# Patient Record
Sex: Male | Born: 1940 | Race: White | Hispanic: No | Marital: Married | Smoking: Former smoker
Health system: Southern US, Community
[De-identification: ages and names within clinical notes are randomized; demographics above are authoritative.]

## PROBLEM LIST (undated history)

## (undated) DIAGNOSIS — K7031 Alcoholic cirrhosis of liver with ascites: Secondary | ICD-10-CM

## (undated) DIAGNOSIS — Z8673 Personal history of transient ischemic attack (TIA), and cerebral infarction without residual deficits: Secondary | ICD-10-CM

## (undated) DIAGNOSIS — E7800 Pure hypercholesterolemia, unspecified: Secondary | ICD-10-CM

## (undated) DIAGNOSIS — I509 Heart failure, unspecified: Secondary | ICD-10-CM

## (undated) DIAGNOSIS — I5189 Other ill-defined heart diseases: Secondary | ICD-10-CM

## (undated) DIAGNOSIS — I10 Essential (primary) hypertension: Secondary | ICD-10-CM

## (undated) DIAGNOSIS — M109 Gout, unspecified: Secondary | ICD-10-CM

## (undated) DIAGNOSIS — C801 Malignant (primary) neoplasm, unspecified: Secondary | ICD-10-CM

## (undated) DIAGNOSIS — Z8679 Personal history of other diseases of the circulatory system: Secondary | ICD-10-CM

## (undated) DIAGNOSIS — F1010 Alcohol abuse, uncomplicated: Secondary | ICD-10-CM

## (undated) DIAGNOSIS — M1990 Unspecified osteoarthritis, unspecified site: Secondary | ICD-10-CM

## (undated) DIAGNOSIS — I2510 Atherosclerotic heart disease of native coronary artery without angina pectoris: Secondary | ICD-10-CM

## (undated) DIAGNOSIS — I639 Cerebral infarction, unspecified: Secondary | ICD-10-CM

## (undated) HISTORY — PX: SPINE SURGERY: SHX786

## (undated) HISTORY — DX: Other ill-defined heart diseases: I51.89

## (undated) HISTORY — DX: Essential (primary) hypertension: I10

## (undated) HISTORY — DX: Gout, unspecified: M10.9

## (undated) HISTORY — DX: Alcohol abuse, uncomplicated: F10.10

## (undated) HISTORY — DX: Atherosclerotic heart disease of native coronary artery without angina pectoris: I25.10

## (undated) HISTORY — DX: Personal history of other diseases of the circulatory system: Z86.79

## (undated) HISTORY — DX: Unspecified osteoarthritis, unspecified site: M19.90

## (undated) HISTORY — PX: SKIN SURGERY: SHX2413

## (undated) HISTORY — DX: Personal history of transient ischemic attack (TIA), and cerebral infarction without residual deficits: Z86.73

## (undated) HISTORY — DX: Malignant (primary) neoplasm, unspecified: C80.1

## (undated) HISTORY — DX: Alcoholic cirrhosis of liver with ascites: K70.31

---

## 1992-11-13 DIAGNOSIS — C801 Malignant (primary) neoplasm, unspecified: Secondary | ICD-10-CM

## 1992-11-13 HISTORY — PX: PROSTATE SURGERY: SHX751

## 1992-11-13 HISTORY — DX: Malignant (primary) neoplasm, unspecified: C80.1

## 2009-03-13 HISTORY — PX: EYE SURGERY: SHX253

## 2009-11-13 DIAGNOSIS — Z8673 Personal history of transient ischemic attack (TIA), and cerebral infarction without residual deficits: Secondary | ICD-10-CM

## 2009-11-13 HISTORY — DX: Personal history of transient ischemic attack (TIA), and cerebral infarction without residual deficits: Z86.73

## 2009-12-30 ENCOUNTER — Ambulatory Visit: Payer: Self-pay | Admitting: Cardiology

## 2009-12-30 ENCOUNTER — Inpatient Hospital Stay (HOSPITAL_COMMUNITY)
Admission: EM | Admit: 2009-12-30 | Discharge: 2010-01-01 | Payer: Self-pay | Source: Home / Self Care | Admitting: Emergency Medicine

## 2009-12-31 ENCOUNTER — Encounter (INDEPENDENT_AMBULATORY_CARE_PROVIDER_SITE_OTHER): Payer: Self-pay | Admitting: Internal Medicine

## 2009-12-31 ENCOUNTER — Ambulatory Visit: Payer: Self-pay | Admitting: Vascular Surgery

## 2011-02-02 LAB — POTASSIUM: Potassium: 3.4 mEq/L — ABNORMAL LOW (ref 3.5–5.1)

## 2011-02-02 LAB — COMPREHENSIVE METABOLIC PANEL
AST: 35 U/L (ref 0–37)
Alkaline Phosphatase: 62 U/L (ref 39–117)
BUN: 8 mg/dL (ref 6–23)
CO2: 24 mEq/L (ref 19–32)
Chloride: 101 mEq/L (ref 96–112)
GFR calc Af Amer: >60 mL/min (ref >60)
Glucose, Bld: 94 mg/dL (ref 70–99)
Potassium: 4 mEq/L (ref 3.5–5.1)
Sodium: 144 mEq/L (ref 135–145)

## 2011-02-02 LAB — URINALYSIS, ROUTINE W REFLEX MICROSCOPIC
Glucose, UA: NEGATIVE mg/dL
Hgb urine dipstick: NEGATIVE
Nitrite: NEGATIVE
Specific Gravity, Urine: 1.012 (ref 1.005–1.030)
Urobilinogen, UA: 1 mg/dL (ref 0.0–1.0)
pH: 6.5 (ref 5.0–8.0)

## 2011-02-02 LAB — DIFFERENTIAL
Eosinophils Absolute: 0 10*3/uL (ref 0.0–0.7)
Eosinophils Relative: 1 % (ref 0–5)
Lymphs Abs: 1.6 10*3/uL (ref 0.7–4.0)
Monocytes Relative: 8 % (ref 3–12)
Neutro Abs: 7.2 10*3/uL (ref 1.7–7.7)

## 2011-02-02 LAB — LIPID PANEL
HDL: 41 mg/dL (ref >39)
VLDL: 24 mg/dL (ref 0–40)

## 2011-02-02 LAB — ETHANOL: Alcohol, Ethyl (B): 21 mg/dL — ABNORMAL HIGH (ref 0–10)

## 2011-02-02 LAB — ANA: Anti Nuclear Antibody(ANA): NEGATIVE

## 2011-02-02 LAB — RAPID URINE DRUG SCREEN, HOSP PERFORMED
Barbiturates: NOT DETECTED
Benzodiazepines: NOT DETECTED

## 2011-02-02 LAB — VITAMIN B12: Vitamin B-12: 229 pg/mL (ref 211–911)

## 2011-02-02 LAB — CBC
HCT: 46.7 % (ref 39.0–52.0)
RDW: 12.6 % (ref 11.5–15.5)

## 2011-02-02 LAB — MAGNESIUM: Magnesium: 2 mg/dL (ref 1.5–2.5)

## 2011-02-02 LAB — PROTIME-INR
INR: 1.1 (ref 0.00–1.49)
Prothrombin Time: 14.1 seconds (ref 11.6–15.2)

## 2011-03-14 DIAGNOSIS — Z8679 Personal history of other diseases of the circulatory system: Secondary | ICD-10-CM

## 2011-03-14 HISTORY — DX: Personal history of other diseases of the circulatory system: Z86.79

## 2011-03-14 HISTORY — PX: BURR HOLE FOR SUBDURAL HEMATOMA: SHX1275

## 2011-03-21 ENCOUNTER — Inpatient Hospital Stay (HOSPITAL_COMMUNITY)
Admission: EM | Admit: 2011-03-21 | Discharge: 2011-03-27 | DRG: 026 | Disposition: A | Discharge status: Home Health | Payer: Medicare Other | Attending: Neurosurgery | Admitting: Neurosurgery

## 2011-03-21 ENCOUNTER — Inpatient Hospital Stay (INDEPENDENT_AMBULATORY_CARE_PROVIDER_SITE_OTHER)
Admission: RE | Admit: 2011-03-21 | Discharge: 2011-03-21 | Disposition: A | Discharge status: Home / Self Care | Payer: Medicare Other | Source: Ambulatory Visit | Attending: Family Medicine | Admitting: Family Medicine

## 2011-03-21 ENCOUNTER — Emergency Department (HOSPITAL_COMMUNITY): Payer: Medicare Other

## 2011-03-21 DIAGNOSIS — I10 Essential (primary) hypertension: Secondary | ICD-10-CM | POA: Diagnosis present

## 2011-03-21 DIAGNOSIS — F17200 Nicotine dependence, unspecified, uncomplicated: Secondary | ICD-10-CM | POA: Diagnosis present

## 2011-03-21 DIAGNOSIS — X58XXXA Exposure to other specified factors, initial encounter: Secondary | ICD-10-CM

## 2011-03-21 DIAGNOSIS — Z8673 Personal history of transient ischemic attack (TIA), and cerebral infarction without residual deficits: Secondary | ICD-10-CM

## 2011-03-21 DIAGNOSIS — Z8546 Personal history of malignant neoplasm of prostate: Secondary | ICD-10-CM

## 2011-03-21 DIAGNOSIS — S065X0A Traumatic subdural hemorrhage without loss of consciousness, initial encounter: Principal | ICD-10-CM | POA: Diagnosis present

## 2011-03-21 DIAGNOSIS — Y998 Other external cause status: Secondary | ICD-10-CM

## 2011-03-21 DIAGNOSIS — R51 Headache: Secondary | ICD-10-CM

## 2011-03-21 DIAGNOSIS — G8190 Hemiplegia, unspecified affecting unspecified side: Secondary | ICD-10-CM | POA: Diagnosis present

## 2011-03-21 DIAGNOSIS — Z7982 Long term (current) use of aspirin: Secondary | ICD-10-CM

## 2011-03-21 LAB — DIFFERENTIAL
Basophils Absolute: 0.1 10*3/uL (ref 0.0–0.1)
Basophils Relative: 1 % (ref 0–1)
Lymphs Abs: 1.5 10*3/uL (ref 0.7–4.0)
Monocytes Relative: 7 % (ref 3–12)
Neutro Abs: 6.6 10*3/uL (ref 1.7–7.7)
Neutrophils Relative %: 75 % (ref 43–77)

## 2011-03-21 LAB — CBC
HCT: 49 % (ref 39.0–52.0)
MCH: 34.8 pg — ABNORMAL HIGH (ref 26.0–34.0)
MCV: 94.8 fL (ref 78.0–100.0)
RBC: 5.17 MIL/uL (ref 4.22–5.81)
WBC: 8.8 10*3/uL (ref 4.0–10.5)

## 2011-03-21 LAB — BASIC METABOLIC PANEL
Creatinine, Ser: 0.75 mg/dL (ref 0.4–1.5)
GFR calc non Af Amer: >60 mL/min (ref >60)
Glucose, Bld: 92 mg/dL (ref 70–99)
Sodium: 140 mEq/L (ref 135–145)

## 2011-03-21 LAB — MRSA PCR SCREENING: MRSA by PCR: NEGATIVE

## 2011-03-21 LAB — PROTIME-INR: INR: 0.94 (ref 0.00–1.49)

## 2011-03-21 LAB — HEPATIC FUNCTION PANEL
AST: 99 U/L — ABNORMAL HIGH (ref 0–37)
Albumin: 4.1 g/dL (ref 3.5–5.2)
Total Bilirubin: 1.2 mg/dL (ref 0.3–1.2)

## 2011-03-21 LAB — TYPE AND SCREEN: Antibody Screen: NEGATIVE

## 2011-03-21 LAB — APTT: aPTT: 24 seconds (ref 24–37)

## 2011-03-22 ENCOUNTER — Inpatient Hospital Stay (HOSPITAL_COMMUNITY): Payer: Medicare Other

## 2011-03-22 LAB — BASIC METABOLIC PANEL
Calcium: 8.5 mg/dL (ref 8.4–10.5)
Chloride: 99 mEq/L (ref 96–112)
Creatinine, Ser: 0.75 mg/dL (ref 0.4–1.5)
GFR calc Af Amer: >60 mL/min (ref >60)
GFR calc non Af Amer: >60 mL/min (ref >60)

## 2011-03-22 LAB — PHOSPHORUS: Phosphorus: 2.7 mg/dL (ref 2.3–4.6)

## 2011-03-22 LAB — MAGNESIUM: Magnesium: 1.7 mg/dL (ref 1.5–2.5)

## 2011-03-23 ENCOUNTER — Inpatient Hospital Stay (HOSPITAL_COMMUNITY): Payer: Medicare Other

## 2011-03-23 LAB — BASIC METABOLIC PANEL
BUN: 8 mg/dL (ref 6–23)
Calcium: 8.2 mg/dL — ABNORMAL LOW (ref 8.4–10.5)
Creatinine, Ser: 0.66 mg/dL (ref 0.4–1.5)
GFR calc non Af Amer: >60 mL/min (ref >60)

## 2011-03-28 NOTE — Op Note (Signed)
  NAMESHAMERE, CAMPAS NO.:  [id number]  MEDICAL RECORD NO.:  [id number]           PATIENT TYPE:  I  LOCATION:  3114                         FACILITY:  [facility]  PHYSICIAN:  Danae Orleans. Venetia Maxon, M.D.  DATE OF BIRTH:  August 25, 1941  DATE OF PROCEDURE:  03/21/2011 DATE OF DISCHARGE:                              OPERATIVE REPORT   PREOPERATIVE DIAGNOSIS:  Right-sided subdural hematoma.  POSTOPERATIVE DIAGNOSIS:  Right-sided subdural hematoma.  PROCEDURE:  Right craniotomy for subdural hematoma.  SURGEON:  Danae Orleans. Venetia Maxon, MD  ANESTHESIA:  General endotracheal anesthesia.  ESTIMATED BLOOD LOSS:  Minimal.  COMPLICATIONS:  None.  DISPOSITION:  Recovery.  INDICATIONS:  Samuel Flores is a 70 year old male with a large pan- hemispheric right-sided subdural hematoma.  It was elected to take him to Surgery for craniotomy and evacuation of subdural.  DESCRIPTION OF PROCEDURE:  [name] was brought to the operating room.  Following satisfactory and uncomplicated induction of general endotracheal anesthesia plus intravenous lines, arterial line, and Foley catheter, he was placed in a supine position on the operating table, was intubated without difficulty by Anesthesia.  His right frontotemporal parietal scalp was shaved, then prepped and draped in usual sterile fashion.  He was placed left side of his head on donut head holder. Area of planned incision was infiltrated with local lidocaine.  Incision was made, carried through the pericranium.  A single bur hole was made and trephined, and then a small bone flap was elevated.  The dura appeared to be bulging and bluish discoloration.  The dura was incised with significant removal of bloody CSF under pressure.  Dura was then opened, tacked back with dural tack-up stitches, and the subdural space was extensively irrigated.  There did appear to be membrane between the subdural space and the subarachnoid space, and this  was carefully opened to allow communication of this loculated portion of subdural.  A #10 JP drain was inserted through a separate stab incision, anchored with a nylon stitch.  The bone flap was replaced with plates and the galea was reapproximated with 2-0 Vicryl sutures.  The skin edges were reapproximated with staples.  Sterile occlusive dressing was placed.  The patient was taken to recovery having tolerated the procedure well.  Counts were correct at the end of the case.     Danae Orleans. Venetia Maxon, M.D.     JDS/MEDQ  D:  03/21/2011  T:  03/22/2011  Job:  [id number]  Electronically Signed by Maeola Harman M.D. on 03/28/2011 07:44:35 AM
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
## 2011-04-04 ENCOUNTER — Other Ambulatory Visit: Payer: Self-pay | Admitting: Neurosurgery

## 2011-04-04 DIAGNOSIS — S065X9A Traumatic subdural hemorrhage with loss of consciousness of unspecified duration, initial encounter: Secondary | ICD-10-CM
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
## 2011-04-12 ENCOUNTER — Ambulatory Visit
Admission: RE | Admit: 2011-04-12 | Discharge: 2011-04-12 | Disposition: A | Discharge status: Home / Self Care | Payer: Medicare Other | Source: Ambulatory Visit | Attending: Neurosurgery | Admitting: Neurosurgery

## 2011-04-12 DIAGNOSIS — S065X9A Traumatic subdural hemorrhage with loss of consciousness of unspecified duration, initial encounter: Secondary | ICD-10-CM

## 2011-12-21 DIAGNOSIS — I10 Essential (primary) hypertension: Secondary | ICD-10-CM | POA: Diagnosis not present

## 2011-12-21 DIAGNOSIS — E7800 Pure hypercholesterolemia, unspecified: Secondary | ICD-10-CM | POA: Diagnosis not present

## 2012-01-05 DIAGNOSIS — I10 Essential (primary) hypertension: Secondary | ICD-10-CM | POA: Diagnosis not present

## 2012-01-05 DIAGNOSIS — E785 Hyperlipidemia, unspecified: Secondary | ICD-10-CM | POA: Diagnosis not present

## 2012-01-09 DIAGNOSIS — Z79899 Other long term (current) drug therapy: Secondary | ICD-10-CM | POA: Diagnosis not present

## 2012-01-25 DIAGNOSIS — H04129 Dry eye syndrome of unspecified lacrimal gland: Secondary | ICD-10-CM | POA: Diagnosis not present

## 2012-01-25 DIAGNOSIS — H43399 Other vitreous opacities, unspecified eye: Secondary | ICD-10-CM | POA: Diagnosis not present

## 2012-01-25 DIAGNOSIS — H524 Presbyopia: Secondary | ICD-10-CM | POA: Diagnosis not present

## 2012-06-18 DIAGNOSIS — Z79899 Other long term (current) drug therapy: Secondary | ICD-10-CM | POA: Diagnosis not present

## 2012-06-18 DIAGNOSIS — E7800 Pure hypercholesterolemia, unspecified: Secondary | ICD-10-CM | POA: Diagnosis not present

## 2012-06-18 DIAGNOSIS — I10 Essential (primary) hypertension: Secondary | ICD-10-CM | POA: Diagnosis not present

## 2012-06-18 DIAGNOSIS — I209 Angina pectoris, unspecified: Secondary | ICD-10-CM | POA: Diagnosis not present

## 2012-10-02 DIAGNOSIS — I10 Essential (primary) hypertension: Secondary | ICD-10-CM | POA: Diagnosis not present

## 2012-10-02 DIAGNOSIS — E785 Hyperlipidemia, unspecified: Secondary | ICD-10-CM | POA: Diagnosis not present

## 2012-12-17 DIAGNOSIS — E7800 Pure hypercholesterolemia, unspecified: Secondary | ICD-10-CM | POA: Diagnosis not present

## 2012-12-17 DIAGNOSIS — I10 Essential (primary) hypertension: Secondary | ICD-10-CM | POA: Diagnosis not present

## 2012-12-17 DIAGNOSIS — Z79899 Other long term (current) drug therapy: Secondary | ICD-10-CM | POA: Diagnosis not present

## 2013-01-01 ENCOUNTER — Encounter (HOSPITAL_COMMUNITY): Payer: Self-pay | Admitting: Neurology

## 2013-01-01 ENCOUNTER — Observation Stay (HOSPITAL_COMMUNITY)
Admission: EM | Admit: 2013-01-01 | Discharge: 2013-01-02 | Disposition: A | Discharge status: Home / Self Care | Payer: Medicare Other | Attending: Cardiology | Admitting: Cardiology

## 2013-01-01 ENCOUNTER — Emergency Department (HOSPITAL_COMMUNITY): Payer: Medicare Other

## 2013-01-01 DIAGNOSIS — F17200 Nicotine dependence, unspecified, uncomplicated: Secondary | ICD-10-CM | POA: Diagnosis not present

## 2013-01-01 DIAGNOSIS — I10 Essential (primary) hypertension: Secondary | ICD-10-CM | POA: Diagnosis not present

## 2013-01-01 DIAGNOSIS — R0789 Other chest pain: Secondary | ICD-10-CM | POA: Diagnosis not present

## 2013-01-01 DIAGNOSIS — R0602 Shortness of breath: Secondary | ICD-10-CM | POA: Diagnosis not present

## 2013-01-01 DIAGNOSIS — I2510 Atherosclerotic heart disease of native coronary artery without angina pectoris: Principal | ICD-10-CM | POA: Insufficient documentation

## 2013-01-01 DIAGNOSIS — R079 Chest pain, unspecified: Secondary | ICD-10-CM | POA: Diagnosis not present

## 2013-01-01 DIAGNOSIS — E785 Hyperlipidemia, unspecified: Secondary | ICD-10-CM | POA: Insufficient documentation

## 2013-01-01 DIAGNOSIS — I209 Angina pectoris, unspecified: Secondary | ICD-10-CM | POA: Diagnosis not present

## 2013-01-01 DIAGNOSIS — I200 Unstable angina: Secondary | ICD-10-CM

## 2013-01-01 HISTORY — DX: Essential (primary) hypertension: I10

## 2013-01-01 HISTORY — DX: Pure hypercholesterolemia, unspecified: E78.00

## 2013-01-01 LAB — URINALYSIS, ROUTINE W REFLEX MICROSCOPIC
Glucose, UA: NEGATIVE mg/dL
Hgb urine dipstick: NEGATIVE
Protein, ur: NEGATIVE mg/dL
pH: 7 (ref 5.0–8.0)

## 2013-01-01 LAB — COMPREHENSIVE METABOLIC PANEL
AST: 66 U/L — ABNORMAL HIGH (ref 0–37)
Albumin: 3.5 g/dL (ref 3.5–5.2)
Alkaline Phosphatase: 95 U/L (ref 39–117)
Chloride: 101 mEq/L (ref 96–112)
Potassium: 3.8 mEq/L (ref 3.5–5.1)
Total Bilirubin: 0.8 mg/dL (ref 0.3–1.2)
Total Protein: 6.8 g/dL (ref 6.0–8.3)

## 2013-01-01 LAB — CBC
Platelets: 178 10*3/uL (ref 150–400)
RDW: 12.5 % (ref 11.5–15.5)
WBC: 9.6 10*3/uL (ref 4.0–10.5)

## 2013-01-01 LAB — POCT I-STAT TROPONIN I: Troponin i, poc: 0 ng/mL (ref 0.00–0.08)

## 2013-01-01 MED ORDER — ACETAMINOPHEN 325 MG PO TABS: 650.0000 mg | ORAL_TABLET | ORAL | Status: DC | PRN

## 2013-01-01 MED ORDER — SODIUM CHLORIDE 0.9 % IV SOLN
INTRAVENOUS | Status: DC
  Administered 2013-01-02: 07:00:00 via INTRAVENOUS

## 2013-01-01 MED ORDER — ONDANSETRON HCL 4 MG/2ML IJ SOLN: 4.0000 mg | Freq: Three times a day (TID) | INTRAMUSCULAR | Status: AC | PRN

## 2013-01-01 MED ORDER — SODIUM CHLORIDE 0.9 % IV SOLN: INTRAVENOUS | Status: DC

## 2013-01-01 MED ORDER — ATENOLOL 50 MG PO TABS
50.0000 mg | ORAL_TABLET | Freq: Every day | ORAL | Status: DC
  Filled 2013-01-01: qty 1

## 2013-01-01 MED ORDER — ENALAPRIL MALEATE 20 MG PO TABS
40.0000 mg | ORAL_TABLET | Freq: Every day | ORAL | Status: DC
  Filled 2013-01-01: qty 2

## 2013-01-01 MED ORDER — NITROGLYCERIN 0.4 MG SL SUBL
0.4000 mg | SUBLINGUAL_TABLET | SUBLINGUAL | Status: DC | PRN
Start: 2013-01-01 — End: 2013-01-02

## 2013-01-01 MED ORDER — ASPIRIN EC 81 MG PO TBEC
81.0000 mg | DELAYED_RELEASE_TABLET | Freq: Every day | ORAL | Status: DC
  Filled 2013-01-01: qty 1

## 2013-01-01 MED ORDER — ENOXAPARIN SODIUM 80 MG/0.8ML ~~LOC~~ SOLN
75.0000 mg | Freq: Once | SUBCUTANEOUS | Status: AC
Start: 2013-01-01 — End: 2013-01-01
  Administered 2013-01-01: 75 mg via SUBCUTANEOUS
  Filled 2013-01-01: qty 0.8

## 2013-01-01 MED ORDER — NITROGLYCERIN IN D5W 200-5 MCG/ML-% IV SOLN
10.0000 ug/min | INTRAVENOUS | Status: DC
  Administered 2013-01-01: 10 ug/min via INTRAVENOUS
  Filled 2013-01-01: qty 250

## 2013-01-01 MED ORDER — ENOXAPARIN SODIUM 80 MG/0.8ML ~~LOC~~ SOLN
75.0000 mg | Freq: Two times a day (BID) | SUBCUTANEOUS | Status: DC
  Administered 2013-01-02: 75 mg via SUBCUTANEOUS
  Filled 2013-01-01 (×3): qty 0.8

## 2013-01-01 MED ORDER — SODIUM CHLORIDE 0.9 % IV SOLN: 20.0000 mL | INTRAVENOUS | Status: DC

## 2013-01-01 MED ORDER — SODIUM CHLORIDE 0.9 % IJ SOLN: 3.0000 mL | Freq: Two times a day (BID) | INTRAMUSCULAR | Status: DC

## 2013-01-01 MED ORDER — SODIUM CHLORIDE 0.9 % IJ SOLN: 3.0000 mL | INTRAMUSCULAR | Status: DC | PRN

## 2013-01-01 MED ORDER — ATORVASTATIN CALCIUM 10 MG PO TABS
10.0000 mg | ORAL_TABLET | Freq: Every day | ORAL | Status: DC
  Filled 2013-01-01: qty 1

## 2013-01-01 MED ORDER — SODIUM CHLORIDE 0.9 % IV SOLN: 250.0000 mL | INTRAVENOUS | Status: DC | PRN

## 2013-01-01 MED ORDER — SODIUM CHLORIDE 0.9 % IV SOLN
INTRAVENOUS | Status: AC
  Administered 2013-01-01: 16:00:00 via INTRAVENOUS

## 2013-01-01 MED ORDER — NITROGLYCERIN IN D5W 200-5 MCG/ML-% IV SOLN: 5.0000 ug/min | INTRAVENOUS | Status: DC

## 2013-01-01 MED ORDER — AMLODIPINE BESYLATE 10 MG PO TABS
10.0000 mg | ORAL_TABLET | Freq: Every day | ORAL | Status: DC
  Filled 2013-01-01: qty 1

## 2013-01-01 MED ORDER — NITROGLYCERIN 2 % TD OINT
1.0000 [in_us] | TOPICAL_OINTMENT | Freq: Once | TRANSDERMAL | Status: AC
  Administered 2013-01-01: 1 [in_us] via TOPICAL
  Filled 2013-01-01: qty 1

## 2013-01-01 NOTE — ED Notes (Signed)
Pt c/o CP and sob while at work today around 0930 am. Pt reports once he was home and sat down he felt a lot better but then he got up to smoke his pipe then the pain came back. Pt describes pain as burning sensation in center of chest along with radiation to left arm. Pt reports this has been going on for the past year and he has gone to see a cardiologist and they did a full work up but didn't find anything.

## 2013-01-01 NOTE — ED Notes (Signed)
Attempted to call report. Per Diplomatic Services operational officer on 2600 - RN is in an isolation room and cannot take report at this moment.

## 2013-01-01 NOTE — ED Notes (Signed)
Per ems- Pt was at work today, developed sob and cp while [place]. CP radiated up left arm. EKG showing bigemy. 325 aspirin PTA, pain free upon EMS arrival, 150/90, HR 94. Skin warm and dry. 20 gauge to left hand. A x 4.

## 2013-01-01 NOTE — ED Provider Notes (Signed)
History     CSN: [id number]  Arrival date & time 01/01/13  1537   First MD Initiated Contact with Patient 01/01/13 1546      Chief Complaint  Patient presents with  . Chest Pain    (Consider location/radiation/quality/duration/timing/severity/associated sxs/prior treatment) HPI Comments: Samuel Flores is a 72 y.o. Male who was at work today, doing heavy lifting, when he developed chest pain chest pain, it was both pressure-like and burning. It radiated to his left arm. At the worst. The pain was 7/10. The initial episode of pain lasted about 2 hours. He then had some recurrent pain. 3 different times while walking; each of these episodes were brief. With the initial pain he had shortness of breath. There's been no nausea, or vomiting. He denies recent illnesses, including fever, chills, cough, nausea, vomiting, dysuria, or change in bowel habits. He took a single nitroglycerin about 10 AM without change in his discomfort. The last episode of chest pain was over an hour ago. EMS gave him a full-strength aspirin.There are no other modifying factors.  Patient is a 72 y.o. male presenting with chest pain. The history is provided by the patient.  Chest Pain   Past Medical History  Diagnosis Date  . Hypercholesteremia   . Hypertension     No past surgical history on file.  No family history on file.  History  Substance Use Topics  . Smoking status: Light Tobacco Smoker  . Smokeless tobacco: Not on file  . Alcohol Use: Yes      Review of Systems  Cardiovascular: Positive for chest pain.  All other systems reviewed and are negative.    Allergies  Review of patient's allergies indicates no known allergies.  Home Medications   Current Outpatient Rx  Name  Route  Sig  Dispense  Refill  . amLODipine (NORVASC) 10 MG tablet   Oral   Take 10 mg by mouth daily.         Marland Kitchen aspirin EC 81 MG tablet   Oral   Take 81 mg by mouth daily.         Marland Kitchen atenolol (TENORMIN) 50  MG tablet   Oral   Take 50 mg by mouth daily.         Marland Kitchen atorvastatin (LIPITOR) 20 MG tablet   Oral   Take 10 mg by mouth daily.         . enalapril (VASOTEC) 20 MG tablet   Oral   Take 40 mg by mouth daily.         . hydrochlorothiazide (MICROZIDE) 12.5 MG capsule   Oral   Take 12.5 mg by mouth daily.           BP 111/49  Pulse 43  Temp(Src) 98.5 F (36.9 C) (Oral)  Resp 25  SpO2 94%  Physical Exam  Nursing note and vitals reviewed. Constitutional: He is oriented to person, place, and time. He appears well-developed and well-nourished. No distress.  HENT:  Head: Normocephalic and atraumatic.  Right Ear: External ear normal.  Left Ear: External ear normal.  Eyes: Conjunctivae and EOM are normal. Pupils are equal, round, and reactive to light.  Neck: Normal range of motion and phonation normal. Neck supple.  Cardiovascular: Normal rate, regular rhythm, normal heart sounds and intact distal pulses.   No murmur  Pulmonary/Chest: Effort normal and breath sounds normal. No respiratory distress. He exhibits no tenderness and no bony tenderness.  Abdominal: Soft. Normal appearance. There is no tenderness.  Musculoskeletal: Normal range of motion. He exhibits no edema and no tenderness.  Neurological: He is alert and oriented to person, place, and time. He has normal strength. No cranial nerve deficit or sensory deficit. He exhibits normal muscle tone. Coordination normal.  Skin: Skin is warm, dry and intact.  Skin changes, head and neck, consistent with sun damage.  Psychiatric: He has a normal mood and affect. His behavior is normal. Judgment and thought content normal.    ED Course  Procedures (including critical care time)  Emergency department treatment; nitroglycerin ointment   Date: 08/30/2012  Rate: 108  Rhythm: Sinus tachycardia with frequent PVCs and bigeminy  QRS Axis: normal  PR and QT Intervals: normal  ST/T Wave abnormalities: normal  PR and QRS  Conduction Disutrbances:none  Narrative Interpretation:   Old EKG Reviewed: unchanged 17:00- Discussed with Cardiology Jacinto Halim), he will admit the patient, and arrange for cardiac catheterization tomorrow. She had abnormal cardiac stress test in 2011 showing inferior scar.  17:05-Additional treatment: Change nitroglycerin to intravenous drip, and Lovenox subcutaneous.  Holding orders written for, admission  CRITICAL CARE Performed by: Mancel Bale L   Total critical care time: 40 minutes  Critical care time was exclusive of separately billable procedures and treating other patients.  Critical care was necessary to treat or prevent imminent or life-threatening deterioration.  Critical care was time spent personally by me on the following activities: development of treatment plan with patient and/or surrogate as well as nursing, discussions with consultants, evaluation of patient's response to treatment, examination of patient, obtaining history from patient or surrogate, ordering and performing treatments and interventions, ordering and review of laboratory studies, ordering and review of radiographic studies, pulse oximetry and re-evaluation of patient's condition.  Labs Reviewed  CBC - Abnormal; Notable for the following:    MCH 34.6 (*)    MCHC 37.0 (*)    All other components within normal limits  COMPREHENSIVE METABOLIC PANEL - Abnormal; Notable for the following:    Glucose, Bld 166 (*)    AST 66 (*)    GFR calc non Af Amer 85 (*)    All other components within normal limits  URINALYSIS, ROUTINE W REFLEX MICROSCOPIC  POCT I-STAT TROPONIN I   Dg Chest Portable 1 View  01/01/2013  *RADIOLOGY REPORT*  Clinical Data: Chest and arm pain, shortness of breath  PORTABLE CHEST - 1 VIEW  Comparison: None.  Findings: No active infiltrate or effusion is seen.  The heart is mildly enlarged.  Mediastinal contours appear normal.  A lower anterior cervical spine fusion plate is present.  No  skeletal abnormality is seen.  IMPRESSION: No active lung disease.   Original Report Authenticated By: Dwyane Dee, M.D.      1. Angina pectoris, unstable       MDM  Clinical evaluation consistent with ACS. No pain on arrival in the ED. initial cardiac markers. Negative for injury. Patient has a very convincing story and will need to be admitted for catheterization.        Samuel Melter, MD 01/01/13 [phone]

## 2013-01-01 NOTE — H&P (Addendum)
Samuel Flores is an 72 y.o. male.   Chief Complaint: Chest pain 1 week. HPI: Patient is a 72 year old Caucasian male who I had evaluated a year and a half ago for chest discomfort.  I had also treated him aggressively for hyperlipidemia and his recent labs on 06/20/2012 where excellent.  He had been doing well and has had no recurrence of chest pain since then.  He was doing well until about a week ago when he started noticing occasional chest heaviness in the middle of the chest and this lasted for 10-15 minutes.  And he had recurrent episodes throughout the week but did not think much about this.  This was described as mild.  However this morning he went to work and before going to work he did have one more episode of chest discomfort which was much more severe but again subsided spontaneously.  While at work he started getting chest discomfort again and this time it was associated with marked dyspnea, radiation to his left arm and tingling and numbness in his left fingers.  He works in [employer] course and the routine chores that he does he was unable to do so without having to stop.  He had not used sublingual nitroglycerin in a while, but he did use and see whether it would help him.  This did not relieve his pain, however after sitting for about 15-20 minutes the pain subsided spontaneously.  He returned home and started to have chest discomfort again and this was getting worse.  He called his wife and eventually EMS was activated and was transferred to the emergency room.  He was also given aspirin after EMS was contacted and by the time EMS arrived in the house he was chest pain-free and he has remained chest pain-free since then. He smokes a pipe every day.  He does not have history of PND or orthopnea denies any palpitations denies any syncope denies any leg edema or pain.  Swelling of the lower extremities, hemoptysis.  Presently he states that he is feeling comfortable since he started on medications  in the emergency room.  Past Medical History  Diagnosis Date  . Hypercholesteremia   . Hypertension     History reviewed. No pertinent past surgical history.  No family history on file. Social History:  reports that he has been smoking.  He does not have any smokeless tobacco history on file. He reports that  drinks alcohol. His drug history is not on file.  Allergies: No Known Allergies  Medications Prior to Admission  Medication Sig Dispense Refill  . amLODipine (NORVASC) 10 MG tablet Take 10 mg by mouth daily.      Marland Kitchen aspirin EC 81 MG tablet Take 81 mg by mouth daily.      Marland Kitchen atenolol (TENORMIN) 50 MG tablet Take 50 mg by mouth daily.      Marland Kitchen atorvastatin (LIPITOR) 20 MG tablet Take 10 mg by mouth daily.      . enalapril (VASOTEC) 20 MG tablet Take 40 mg by mouth daily.      . hydrochlorothiazide (MICROZIDE) 12.5 MG capsule Take 12.5 mg by mouth daily.       Review of Systems - patient denies any bowel or bladder disturbances,  denies any recent weight gain or weight loss, denies recent long travel, no neurological deficits, denies symptoms of claudication.  He is not a diabetic.   Other systems are negative.  Blood pressure 122/56, pulse 44, temperature 98 F (36.7  C), temperature source Oral, resp. rate 19, SpO2 93.00%. General appearance: alert, cooperative, appears stated age and no distress Eyes: conjunctivae/corneas clear. PERRL, EOM's intact. Fundi benign. Neck: no adenopathy, no carotid bruit, no JVD, supple, symmetrical, trachea midline and thyroid not enlarged, symmetric, no tenderness/mass/nodules Neck: JVP - normal, carotids 2+= without bruits Resp: clear to auscultation bilaterally Chest wall: no tenderness Cardio: regular rate and rhythm, S1, S2 normal, no murmur, click, rub or gallop GI: soft, non-tender; bowel sounds normal; no masses,  no organomegaly Extremities: extremities normal, atraumatic, no cyanosis or edema Pulses: 2+ and symmetric Skin: Skin color,  texture, turgor normal. No rashes or lesions Neurologic: Grossly normal  Results for orders placed during the hospital encounter of 01/01/13 (from the past 48 hour(s))  CBC     Status: Abnormal   Collection Time    01/01/13  3:49 PM      Result Value Range   WBC 9.6  4.0 - 10.5 K/uL   RBC 4.65  4.22 - 5.81 MIL/uL   Hemoglobin 16.1  13.0 - 17.0 g/dL   HCT 16.1  09.6 - 04.5 %   MCV 93.5  78.0 - 100.0 fL   MCH 34.6 (*) 26.0 - 34.0 pg   MCHC 37.0 (*) 30.0 - 36.0 g/dL   Comment: RULED OUT INTERFERING SUBSTANCES   RDW 12.5  11.5 - 15.5 %   Platelets 178  150 - 400 K/uL  COMPREHENSIVE METABOLIC PANEL     Status: Abnormal   Collection Time    01/01/13  3:49 PM      Result Value Range   Sodium 140  135 - 145 mEq/L   Potassium 3.8  3.5 - 5.1 mEq/L   Chloride 101  96 - 112 mEq/L   CO2 25  19 - 32 mEq/L   Glucose, Bld 166 (*) 70 - 99 mg/dL   BUN 15  6 - 23 mg/dL   Creatinine, Ser 4.09  0.50 - 1.35 mg/dL   Calcium 8.9  8.4 - 81.1 mg/dL   Total Protein 6.8  6.0 - 8.3 g/dL   Albumin 3.5  3.5 - 5.2 g/dL   AST 66 (*) 0 - 37 U/L   ALT 42  0 - 53 U/L   Alkaline Phosphatase 95  39 - 117 U/L   Total Bilirubin 0.8  0.3 - 1.2 mg/dL   GFR calc non Af Amer 85 (*) >90 mL/min   GFR calc Af Amer >90  >90 mL/min   Comment:            The eGFR has been calculated     using the CKD EPI equation.     This calculation has not been     validated in all clinical     situations.     eGFR's persistently     <90 mL/min signify     possible Chronic Kidney Disease.  POCT I-STAT TROPONIN I     Status: None   Collection Time    01/01/13  4:12 PM      Result Value Range   Troponin i, poc 0.00  0.00 - 0.08 ng/mL   Comment 3            Comment: Due to the release kinetics of cTnI,     a negative result within the first hours     of the onset of symptoms does not rule out     myocardial infarction with certainty.     If myocardial infarction  is still suspected,     repeat the test at appropriate  intervals.  URINALYSIS, ROUTINE W REFLEX MICROSCOPIC     Status: None   Collection Time    01/01/13  4:26 PM      Result Value Range   Color, Urine YELLOW  YELLOW   APPearance CLEAR  CLEAR   Specific Gravity, Urine 1.014  1.005 - 1.030   pH 7.0  5.0 - 8.0   Glucose, UA NEGATIVE  NEGATIVE mg/dL   Hgb urine dipstick NEGATIVE  NEGATIVE   Bilirubin Urine NEGATIVE  NEGATIVE   Ketones, ur NEGATIVE  NEGATIVE mg/dL   Protein, ur NEGATIVE  NEGATIVE mg/dL   Urobilinogen, UA 1.0  0.0 - 1.0 mg/dL   Nitrite NEGATIVE  NEGATIVE   Leukocytes, UA NEGATIVE  NEGATIVE   Comment: MICROSCOPIC NOT DONE ON URINES WITH NEGATIVE PROTEIN, BLOOD, LEUKOCYTES, NITRITE, OR GLUCOSE <1000 mg/dL.   Dg Chest Portable 1 View  01/01/2013  *RADIOLOGY REPORT*  Clinical Data: Chest and arm pain, shortness of breath  PORTABLE CHEST - 1 VIEW  Comparison: None.  Findings: No active infiltrate or effusion is seen.  The heart is mildly enlarged.  Mediastinal contours appear normal.  A lower anterior cervical spine fusion plate is present.  No skeletal abnormality is seen.  IMPRESSION: No active lung disease.   Original Report Authenticated By: Dwyane Dee, M.D.     Labs:   Lab Results  Component Value Date   WBC 9.6 01/01/2013   HGB 16.1 01/01/2013   HCT 43.5 01/01/2013   MCV 93.5 01/01/2013   PLT 178 01/01/2013    Recent Labs Lab 01/01/13 1549  NA 140  K 3.8  CL 101  CO2 25  BUN 15  CREATININE 0.84  CALCIUM 8.9  PROT 6.8  BILITOT 0.8  ALKPHOS 95  ALT 42  AST 66*  GLUCOSE 166*    EKG: Normal sinus rhythm, normal axis, normal QT.  Frequent PVCs in the pattern of bigeminy. .  Outpatient labs/procedures : Labs (NMR lipoprofile) done on 06/18/2012  HDL particles number normal LP-IR score 11.  AST very minimally elevated at 56.   Total cholesterol 168, triglycerides 55, HDL 66, LDL 91.  CMP was within normal limits.  AST was minimally elevated at 56.  Echo 11.29.12: Low normal LVEF 50%. Severe biatrial enlargement.  Mild to Mod RV dil. Mod Pul HTN.  Stress sestamibi 10/13/11: Inferior wall scar without ischemia. EF 46%. low risk.   Assessment/Plan 1.  Chest pain very atypical for unstable angina pectoris.  Associated with shortness of breath which is unusual for the patient with the usual activity that he does routinely.  Chest pain radiating to the left arm. 2. Hypertension 3. Hyperlipidemia.. 4. Tobacco use disorder, patient smokes pipe.  Recommendation: Patient will be admitted to the hospital and will be ruled out for myocardial infarction.  Point-of-care marker has been negative for myocardial injury.  The frequent PVC, bigeminy is new.  I'll make further recommendation as to more long with this clinical course.  I have discussed with the patient regarding continued medical therapy, repeating stress test versus proceeding with cardiac catheterization.  At this point given recent stress test which is already mildly abnormal, although low risk, I suspect he would probably be better served by proceeding directly with cardiac catheterization.  I have discussed with the patient regarding risks, benefits, alternatives to this.  Patient wants to proceed with cardiac catheterization.  He understands less than 1% risk of  death, stroke, MI, need for urgent CABG, bleeding, infection but not limited to this. Patient will be treated for unstable angina. I have discussed with him regarding smoking cessation.  I'll continue to reinforce this.  Pamella Pert, MD 01/01/2013, 10:45 PM [facility] Pager: [phone] Office: [phone] If no answer: Cell:  [phone]

## 2013-01-02 ENCOUNTER — Encounter (HOSPITAL_COMMUNITY)
Admission: EM | Disposition: A | Discharge status: Home / Self Care | Payer: Self-pay | Source: Home / Self Care | Attending: Cardiology

## 2013-01-02 DIAGNOSIS — R079 Chest pain, unspecified: Secondary | ICD-10-CM | POA: Diagnosis not present

## 2013-01-02 HISTORY — PX: LEFT HEART CATHETERIZATION WITH CORONARY ANGIOGRAM: SHX5451

## 2013-01-02 LAB — LIPID PANEL
Cholesterol: 139 mg/dL (ref 0–200)
LDL Cholesterol: 69 mg/dL (ref 0–99)
Total CHOL/HDL Ratio: 2.8 RATIO
VLDL: 21 mg/dL (ref 0–40)

## 2013-01-02 LAB — TROPONIN I
Troponin I: <0.3 ng/mL (ref <0.30)
Troponin I: <0.3 ng/mL (ref <0.30)

## 2013-01-02 LAB — TSH: TSH: 3.569 u[IU]/mL (ref 0.350–4.500)

## 2013-01-02 LAB — HEMOGLOBIN A1C
Hgb A1c MFr Bld: 5.5 % (ref <5.7)
Mean Plasma Glucose: 111 mg/dL (ref <117)

## 2013-01-02 LAB — PROTIME-INR: INR: 1.07 (ref 0.00–1.49)

## 2013-01-02 SURGERY — LEFT HEART CATHETERIZATION WITH CORONARY ANGIOGRAM
Anesthesia: LOCAL

## 2013-01-02 MED ORDER — ACETAMINOPHEN 325 MG PO TABS: 650.0000 mg | ORAL_TABLET | ORAL | Status: DC | PRN

## 2013-01-02 MED ORDER — NITROGLYCERIN 0.4 MG SL SUBL: 0.4000 mg | SUBLINGUAL_TABLET | SUBLINGUAL | Status: DC | PRN

## 2013-01-02 MED ORDER — NITROGLYCERIN 1 MG/10 ML FOR IR/CATH LAB
INTRA_ARTERIAL | Status: AC
  Filled 2013-01-02: qty 10

## 2013-01-02 MED ORDER — MIDAZOLAM HCL 2 MG/2ML IJ SOLN
INTRAMUSCULAR | Status: AC
  Filled 2013-01-02: qty 2

## 2013-01-02 MED ORDER — HEPARIN (PORCINE) IN NACL 2-0.9 UNIT/ML-% IJ SOLN
INTRAMUSCULAR | Status: AC
  Filled 2013-01-02: qty 1000

## 2013-01-02 MED ORDER — LIDOCAINE HCL (PF) 1 % IJ SOLN
INTRAMUSCULAR | Status: AC
  Filled 2013-01-02: qty 30

## 2013-01-02 MED ORDER — SODIUM CHLORIDE 0.9 % IV SOLN: 1.0000 mL/kg/h | INTRAVENOUS | Status: DC

## 2013-01-02 MED ORDER — VERAPAMIL HCL 2.5 MG/ML IV SOLN
INTRAVENOUS | Status: AC
  Filled 2013-01-02: qty 2

## 2013-01-02 MED ORDER — HEPARIN SODIUM (PORCINE) 1000 UNIT/ML IJ SOLN
INTRAMUSCULAR | Status: AC
  Filled 2013-01-02: qty 1

## 2013-01-02 MED ORDER — ONDANSETRON HCL 4 MG/2ML IJ SOLN: 4.0000 mg | Freq: Four times a day (QID) | INTRAMUSCULAR | Status: DC | PRN

## 2013-01-02 MED ORDER — HYDROMORPHONE HCL PF 2 MG/ML IJ SOLN
INTRAMUSCULAR | Status: AC
  Filled 2013-01-02: qty 1

## 2013-01-02 NOTE — Discharge Summary (Signed)
Physician Discharge Summary  Patient ID: Samuel Flores MRN: [id number] DOB/AGE: [birth year] 74 y.o.  Admit date: 01/01/2013 Discharge date: 01/02/2013  Primary Discharge Diagnosis Non obstructive CAD Secondary Discharge Diagnosis Hypertension Hyperlipidemia Tobacco use disorder  Significant Diagnostic Studies: Cardiac Cath 01/02/13:  Left ventricle: Performed in the RAO projection revealed LVEF of 60%. There was no significant MR. no wall motion abnormality. Patient has underlying frequent PVC.  Right coronary artery: The vessel is Dominant. There is a mild 20-30% stenosis at the proximal segment at the crux followed by a hazy 20-30% stenosis in the mid to distal segment, gives origin to a large PDA and PL branch. No high-grade stenosis or unstable plaque evident.  Left main coronary artery is large and there is mild to moderate amount of diffuse coronary calcification evident without luminal obstruction.  Circumflex coronary artery: A large vessel giving origin to a large obtuse marginal 1. It is smooth and has mild proximal coronary calcification and very minimal luminal irregularity.  LAD: LAD gives origin to a large diagonal-1. LAD has mild diffuse luminal irregularities. There is mild proximal coronary calcification. The midsegment of the LAD shows a eccentric mildly calcified 20-30% stenosis.  Consults:   Hospital Course: Patient presented to the emergency department complaining of chest pain with radiation to the left arm associated with marked dyspnea. His symptoms were very worrisome for unstable angina pectoris. He also had new frequent PVCs on the EKG including bigeminal rhythm. He was admitted to the hospital with a high-grade suspicion for unstable angina pectoris. He was ruled out for myocardial infarction and brought to the cardiac catheterization lab the following morning. The cardiac catheterization revealed mild to moderate disease with moderate amount of calcification in the  coronary arteries. There was no lesion that appeared to be high-grade. Mild haziness was noted in the mid to distal segment of the right coronary artery, which could have been a potential unstable plaque but has healed up nicely. Hence no intervention was necessary. Patient felt stable for discharge with outpatient aggressive risk management. Smoking cessation was again discussed with the patient.   Discharge Exam: Blood pressure 106/39, pulse 86, temperature 98 F (36.7 C), temperature source Oral, resp. rate 23, height 5\' 11"  (1.803 m), weight 74.1 kg (163 lb 5.8 oz), SpO2 91.00%.   General appearance: alert, cooperative, appears stated age and no distress  Eyes: conjunctivae/corneas clear. PERRL, EOM's intact. Fundi benign.  Neck: no adenopathy, no carotid bruit, no JVD, supple, symmetrical, trachea midline and thyroid not enlarged, symmetric, no tenderness/mass/nodules  Neck: JVP - normal, carotids 2+= without bruits  Resp: clear to auscultation bilaterally  Chest wall: no tenderness  Cardio: regular rate and rhythm, S1, S2 normal, no murmur, click, rub or gallop  GI: soft, non-tender; bowel sounds normal; no masses, no organomegaly  Extremities: extremities normal, atraumatic, no cyanosis or edema  Pulses: 2+ and symmetric  Skin: Skin color, texture, turgor normal. No rashes or lesions  Neurologic: Grossly normal   Labs:   Lab Results  Component Value Date   WBC 9.6 01/01/2013   HGB 16.1 01/01/2013   HCT 43.5 01/01/2013   MCV 93.5 01/01/2013   PLT 178 01/01/2013    Recent Labs Lab 01/01/13 1549  NA 140  K 3.8  CL 101  CO2 25  BUN 15  CREATININE 0.84  CALCIUM 8.9  PROT 6.8  BILITOT 0.8  ALKPHOS 95  ALT 42  AST 66*  GLUCOSE 166*   Lab Results  Component Value Date  TROPONINI <0.30 01/02/2013    Lipid Panel     Component Value Date/Time   CHOL 139 01/02/2013 0545   TRIG 107 01/02/2013 0545   HDL 49 01/02/2013 0545   CHOLHDL 2.8 01/02/2013 0545   VLDL 21 01/02/2013  0545   LDLCALC 69 01/02/2013 0545   Lipid profile 2.2014: Glucose of 139, triglycerides 107, HDL 49, LDL 69. Lipids are good. Labs (NMR lipoprofile) done on 06/18/2012 HDL particles number normal LP-IR score 11. AST very minimally elevated at 56. Total cholesterol 168, triglycerides 55, HDL 66, LDL 91. CMP was within normal limits. AST was minimally elevated at 56.  Echo 11.29.12: Low normal LVEF 50%. Severe biatrial enlargement. Mild to Mod RV dil. Mod Pul HTN.  Stress sestamibi 10/13/11: Inferior wall scar without ischemia. EF 46%. low risk.   EKG: EKG: Normal sinus rhythm, normal axis, normal QT. Frequent PVCs in the pattern of bigeminy.  Radiology: Dg Chest Portable 1 View  01/01/2013   PORTABLE CHEST - 1 VIEW  Comparison: None.  Findings: No active infiltrate or effusion is seen.  The heart is mildly enlarged.  Mediastinal contours appear normal.  A lower anterior cervical spine fusion plate is present.  No skeletal abnormality is seen.  IMPRESSION: No active lung disease.   Original Report Authenticated By: Dwyane Dee, M.D.     FOLLOW UP PLANS AND APPOINTMENTS    Medication List    TAKE these medications       amLODipine 10 MG tablet  Commonly known as:  NORVASC  Take 10 mg by mouth daily.     aspirin EC 81 MG tablet  Take 81 mg by mouth daily.     atenolol 50 MG tablet  Commonly known as:  TENORMIN  Take 50 mg by mouth daily.     atorvastatin 20 MG tablet  Commonly known as:  LIPITOR  Take 10 mg by mouth daily.     enalapril 20 MG tablet  Commonly known as:  VASOTEC  Take 40 mg by mouth daily.     hydrochlorothiazide 12.5 MG capsule  Commonly known as:  MICROZIDE  Take 12.5 mg by mouth daily.           Follow-up Information   Follow up with Pamella Pert, MD On 01/16/2013. (3 pm)    Contact information:   [address] [place] [address] [phone]        Pamella Pert, MD 01/02/2013, 8:26 AM  Pager: [phone] Office:  [phone] If no answer: [phone]

## 2013-01-02 NOTE — Interval H&P Note (Signed)
History and Physical Interval Note:  01/02/2013 7:44 AM  Samuel Flores  has presented today for surgery, with the diagnosis of cp  The various methods of treatment have been discussed with the patient and family. After consideration of risks, benefits and other options for treatment, the patient has consented to  Procedure(s): LEFT HEART CATHETERIZATION WITH CORONARY ANGIOGRAM (N/A) and possible angioplasty as a surgical intervention .  The patient's history has been reviewed, patient examined, no change in status, stable for surgery.  I have reviewed the patient's chart and labs.  Questions were answered to the patient's satisfaction.     Pamella Pert

## 2013-01-02 NOTE — CV Procedure (Signed)
Procedure performed:  Left heart catheterization including hemodynamic monitoring of the left ventricle, LV gram, selective right and left coronary arteriography.  Indication patient is a 72 year-old male with history of hypertension,  hyperlipidemia, tobacco use disorder   who presents with chest pain suggestive of unstable angina pectoris. Patient has  had non invasive testing 1.5 years ago which was abnormal but low risk and was treated medically.He now presents with symptoms concerning for [place] to the ED.  Hence is brought to the cardiac catheterization lab to evaluate the  coronary anatomy for definitive diagnosis of CAD.  Hemodynamic data:  Left ventricular pressure was 71/0 with LVEDP of 6 mm mercury. Aortic pressure was 71/49 with a mean of 59 mm mercury. There was no pressure gradient across the aortic valve. Post procedure closing pressure 100/62 mm Hg.  Left ventricle: Performed in the RAO projection revealed LVEF of 60%. There was no significant  MR. no wall motion abnormality. Patient has underlying frequent PVC.  Right coronary artery: The vessel is  Dominant. There is a mild 20-30% stenosis at the proximal segment at the crux followed by a hazy 20-30% stenosis in the mid to distal segment, gives origin to a large PDA and PL branch. No high-grade stenosis or unstable plaque evident.  Left main coronary artery is large and there is mild to moderate amount of diffuse coronary calcification evident without luminal obstruction.  Circumflex coronary artery: A large vessel giving origin to a large obtuse marginal 1. It is smooth and has mild proximal coronary calcification and very minimal luminal irregularity.  LAD:  LAD gives origin to a large diagonal-1.  LAD has mild diffuse luminal irregularities. There is mild proximal coronary calcification. The midsegment of the LAD shows a eccentric mildly calcified 20-30% stenosis.   Technique: Under sterile precautions using a 6 French right  radial  arterial access, a 6 French sheath was introduced into the right radial artery. A 5 [place] Tig 4 catheter was advanced into the ascending aorta selective  right coronary artery and left coronary artery was cannulated and angiography was performed in multiple views. The catheter was pulled back Out of the body over exchange length J-wire.  Same Catheter was used to perform LV gram which was performed in LAO projection.  Catheter exchanged out of the body over J-Wire. NO immediate complications noted. Patient tolerated the procedure well.   Rec: Medical therapy with aggressive risk factor reduction.   Disposition: Will be discharged home today with outpatient follow up.

## 2013-01-16 DIAGNOSIS — I2510 Atherosclerotic heart disease of native coronary artery without angina pectoris: Secondary | ICD-10-CM | POA: Diagnosis not present

## 2013-01-16 DIAGNOSIS — E7800 Pure hypercholesterolemia, unspecified: Secondary | ICD-10-CM | POA: Diagnosis not present

## 2013-01-16 DIAGNOSIS — Z87891 Personal history of nicotine dependence: Secondary | ICD-10-CM | POA: Diagnosis not present

## 2013-01-16 DIAGNOSIS — I10 Essential (primary) hypertension: Secondary | ICD-10-CM | POA: Diagnosis not present

## 2013-01-28 ENCOUNTER — Telehealth: Payer: Self-pay | Admitting: Physician Assistant

## 2013-01-28 DIAGNOSIS — I10 Essential (primary) hypertension: Secondary | ICD-10-CM

## 2013-01-28 MED ORDER — HYDROCHLOROTHIAZIDE 12.5 MG PO CAPS: 12.5000 mg | ORAL_CAPSULE | Freq: Every day | ORAL | Status: DC

## 2013-01-28 MED ORDER — ATENOLOL 50 MG PO TABS: 50.0000 mg | ORAL_TABLET | Freq: Every day | ORAL | Status: DC

## 2013-01-28 NOTE — Telephone Encounter (Signed)
Medications refilled

## 2013-02-13 ENCOUNTER — Encounter: Payer: Self-pay | Admitting: Family Medicine

## 2013-02-13 DIAGNOSIS — E7800 Pure hypercholesterolemia, unspecified: Secondary | ICD-10-CM | POA: Insufficient documentation

## 2013-02-13 DIAGNOSIS — C801 Malignant (primary) neoplasm, unspecified: Secondary | ICD-10-CM | POA: Insufficient documentation

## 2013-02-13 DIAGNOSIS — M109 Gout, unspecified: Secondary | ICD-10-CM | POA: Insufficient documentation

## 2013-02-13 DIAGNOSIS — Z8679 Personal history of other diseases of the circulatory system: Secondary | ICD-10-CM | POA: Insufficient documentation

## 2013-02-14 ENCOUNTER — Encounter: Payer: Self-pay | Admitting: Physician Assistant

## 2013-02-14 ENCOUNTER — Ambulatory Visit (INDEPENDENT_AMBULATORY_CARE_PROVIDER_SITE_OTHER): Payer: Medicare Other | Admitting: Physician Assistant

## 2013-02-14 VITALS — BP 104/66 | HR 52 | Temp 97.6°F | Resp 18 | Ht 68.5 in | Wt 170.0 lb

## 2013-02-14 DIAGNOSIS — Z7189 Other specified counseling: Secondary | ICD-10-CM

## 2013-02-14 DIAGNOSIS — F17200 Nicotine dependence, unspecified, uncomplicated: Secondary | ICD-10-CM

## 2013-02-14 DIAGNOSIS — Z716 Tobacco abuse counseling: Secondary | ICD-10-CM

## 2013-02-14 DIAGNOSIS — J441 Chronic obstructive pulmonary disease with (acute) exacerbation: Secondary | ICD-10-CM

## 2013-02-14 MED ORDER — ALBUTEROL SULFATE (2.5 MG/3ML) 0.083% IN NEBU: 2.5000 mg | INHALATION_SOLUTION | Freq: Four times a day (QID) | RESPIRATORY_TRACT | Status: DC | PRN

## 2013-02-14 MED ORDER — VARENICLINE TARTRATE 0.5 MG PO TABS: 0.5000 mg | ORAL_TABLET | Freq: Two times a day (BID) | ORAL | Status: DC

## 2013-02-14 MED ORDER — TIOTROPIUM BROMIDE MONOHYDRATE 18 MCG IN CAPS: 18.0000 ug | ORAL_CAPSULE | Freq: Every day | RESPIRATORY_TRACT | Status: DC

## 2013-02-14 MED ORDER — VARENICLINE TARTRATE 1 MG PO TABS: 1.0000 mg | ORAL_TABLET | Freq: Two times a day (BID) | ORAL | Status: DC

## 2013-02-14 NOTE — Progress Notes (Signed)
Patient ID: Samuel Flores MRN: [id number], DOB: [birth year], 72 y.o. Date of Encounter: 02/14/2013, 9:09 AM    Chief Complaint:  Chief Complaint  Patient presents with  . c/o alot of congestion and shortnes of breath since in hosp      HPI: 72 y.o. year old male went to hospital in February secondary to shortness of breath and some chest pressure with exertion.  Had cath by Dr Jacinto Halim which revealed no high grad stenosis. Pt says after hospitalization, he had chest congestion, cough with phlegm. Says the ohlegm and cough have resolved. However, says he still has SOB with exertion. Has never smoked cigarettes but has smoked pipe for many years and still smokes pipe. "It's a terrible habit.ibuprofen wish I quit"    Home Meds: Current Outpatient Prescriptions on File Prior to Visit  Medication Sig Dispense Refill  . amLODipine (NORVASC) 10 MG tablet Take 10 mg by mouth daily.      Marland Kitchen aspirin EC 81 MG tablet Take 81 mg by mouth daily.      Marland Kitchen atenolol (TENORMIN) 50 MG tablet Take 1 tablet (50 mg total) by mouth daily.  30 tablet  3  . atorvastatin (LIPITOR) 20 MG tablet Take 10 mg by mouth daily.      . enalapril (VASOTEC) 20 MG tablet Take 40 mg by mouth daily.      . hydrochlorothiazide (MICROZIDE) 12.5 MG capsule Take 1 capsule (12.5 mg total) by mouth daily.  30 capsule  3  . nitroGLYCERIN (NITROSTAT) 0.4 MG SL tablet Place 1 tablet (0.4 mg total) under the tongue every 5 (five) minutes x 3 doses as needed for chest pain.  30 tablet  4   No current facility-administered medications on file prior to visit.    Allergies: No Known Allergies    Review of Systems: Constitutional: negative for chills, fever, night sweats, weight changes, or fatigue  HEENT: negative for vision changes, hearing loss, congestion, rhinorrhea, ST, epistaxis, or sinus pressure Cardiovascular: negative for chest pain or palpitations Respiratory: negative for hemoptysis. See HPI Abdominal: negative for abdominal  pain, nausea, vomiting, diarrhea, or constipation Dermatological: negative for rash Neurologic: negative for headache, dizziness, or syncope    Physical Exam: Blood pressure 104/66, pulse 52, temperature 97.6 F (36.4 C), temperature source Oral, resp. rate 18, height 5' 8.5" (1.74 m), weight 170 lb (77.111 kg)., Body mass index is 25.47 kg/(m^2). General: Well developed, well nourished, in no acute distress. HEENT: Normocephalic, atraumatic, eyes without discharge, sclera non-icteric, nares are without discharge. Bilateral auditory canals clear, TM's are without perforation, pearly grey and translucent with reflective cone of light bilaterally. Oral cavity moist, posterior pharynx without exudate, erythema, peritonsillar abscess, or post nasal drip.  Neck: Supple. No thyromegaly. Full ROM. No lymphadenopathy. Lungs: Clear bilaterally to auscultation without wheezes, rales, or rhonchi. Breathing is unlabored. Heart: RRR with S1 S2. No murmurs, rubs, or gallops appreciated. Msk:  Strength and tone normal for age. Extremities/Skin: Warm and dry. No clubbing or cyanosis. No edema. No rashes or suspicious lesions. Neuro: Alert and oriented X 3. Moves all extremities spontaneously. Gait is normal. CNII-XII grossly in tact. Psych:  Responds to questions appropriately with a normal affect.    ASSESSMENT AND PLAN:  72 y.o. year old male with  1. COPD exacerbation His Dyspnea on exertion is secondary to COPD and ongoing tobacco use. He had cardiac cath that revealed no high grade stenosis. He had a Chest X Ray 01/01/13 which was normal (I  reviewed that report today).Will initiate meds for COPD.  - tiotropium (SPIRIVA HANDIHALER) 18 MCG inhalation capsule; Place 1 capsule (18 mcg total) into inhaler and inhale daily.  Dispense: 30 capsule; Refill: 12 - albuterol (PROVENTIL) (2.5 MG/3ML) 0.083% nebulizer solution; Take 3 mLs (2.5 mg total) by nebulization every 6 (six) hours as needed for wheezing.   Dispense: 150 mL; Refill: 1  2. Smoker  3. Encounter for smoking cessation counseling Discussed Chantix. Pt agreeable. Wants to use this to help with cessation. Discussed potential adverse effects including, change in mood/behavior,suicidal ideation. He voices understanding, agrees to stop med and f/u immediately if develops adv effects.  I also provided handout with tips for cessation. - varenicline (CHANTIX) 0.5 MG tablet; Take 1 tablet (0.5 mg total) by mouth 2 (two) times daily.  Dispense: 60 tablet; Refill: 0 - varenicline (CHANTIX CONTINUING MONTH PAK) 1 MG tablet; Take 1 tablet (1 mg total) by mouth 2 (two) times daily.  Dispense: 60 tablet; Refill: 1  He has recently had labs at hospital so does not need those now. Also, hs had eval with Dr Jacinto Halim. His HTN, HLD etc controlled. Can wait 6 months for f/u OV here if his brathing improves with the Spiriva. F/U sooner if needed.  Signed, [address] [place], [place], [facility] 02/14/2013 9:09 AM

## 2013-02-17 ENCOUNTER — Telehealth: Payer: Self-pay | Admitting: Physician Assistant

## 2013-02-17 MED ORDER — ALBUTEROL SULFATE HFA 108 (90 BASE) MCG/ACT IN AERS: 2.0000 | INHALATION_SPRAY | Freq: Four times a day (QID) | RESPIRATORY_TRACT | Status: DC | PRN

## 2013-02-17 NOTE — Telephone Encounter (Signed)
Ventolin HFA called to pharmacy

## 2013-02-17 NOTE — Telephone Encounter (Signed)
Did you want this patient on Nebulizer medication??  That is what he got but he does not have a nebulizer machine.  Patient is asking for an inhaler??

## 2013-02-17 NOTE — Telephone Encounter (Signed)
Yes. This was supposed to be inhaler. Call in Rx Ventolin HFA  2 puffs Q 4-6 hr prn # one / 2 refills Let pt know--sorry..I have not gotten used to this computer!!!!

## 2013-03-14 ENCOUNTER — Telehealth: Payer: Self-pay | Admitting: Physician Assistant

## 2013-03-14 DIAGNOSIS — I10 Essential (primary) hypertension: Secondary | ICD-10-CM

## 2013-03-14 MED ORDER — HYDROCHLOROTHIAZIDE 12.5 MG PO CAPS: 12.5000 mg | ORAL_CAPSULE | Freq: Every day | ORAL | Status: DC

## 2013-03-14 MED ORDER — ATENOLOL 50 MG PO TABS: 50.0000 mg | ORAL_TABLET | Freq: Every day | ORAL | Status: DC

## 2013-03-14 MED ORDER — ENALAPRIL MALEATE 20 MG PO TABS: 40.0000 mg | ORAL_TABLET | Freq: Every day | ORAL | Status: DC

## 2013-03-14 NOTE — Telephone Encounter (Signed)
Medication refilled per protocol. 

## 2013-03-17 ENCOUNTER — Telehealth: Payer: Self-pay | Admitting: Physician Assistant

## 2013-03-17 DIAGNOSIS — I10 Essential (primary) hypertension: Secondary | ICD-10-CM

## 2013-03-17 MED ORDER — HYDROCHLOROTHIAZIDE 12.5 MG PO CAPS: 12.5000 mg | ORAL_CAPSULE | Freq: Every day | ORAL | Status: DC

## 2013-03-17 NOTE — Telephone Encounter (Signed)
Just refilled 03/14/13??  Resent to pharmacy.

## 2013-04-28 ENCOUNTER — Ambulatory Visit (INDEPENDENT_AMBULATORY_CARE_PROVIDER_SITE_OTHER): Payer: Medicare Other | Admitting: Family Medicine

## 2013-04-28 ENCOUNTER — Encounter: Payer: Self-pay | Admitting: Family Medicine

## 2013-04-28 VITALS — BP 110/72 | HR 52 | Temp 97.8°F | Resp 16 | Ht 69.5 in | Wt 165.0 lb

## 2013-04-28 DIAGNOSIS — I959 Hypotension, unspecified: Secondary | ICD-10-CM | POA: Diagnosis not present

## 2013-04-28 DIAGNOSIS — R0989 Other specified symptoms and signs involving the circulatory and respiratory systems: Secondary | ICD-10-CM

## 2013-04-28 DIAGNOSIS — C801 Malignant (primary) neoplasm, unspecified: Secondary | ICD-10-CM | POA: Diagnosis not present

## 2013-04-28 DIAGNOSIS — F17200 Nicotine dependence, unspecified, uncomplicated: Secondary | ICD-10-CM

## 2013-04-28 DIAGNOSIS — E7800 Pure hypercholesterolemia, unspecified: Secondary | ICD-10-CM | POA: Diagnosis not present

## 2013-04-28 DIAGNOSIS — I10 Essential (primary) hypertension: Secondary | ICD-10-CM

## 2013-04-28 DIAGNOSIS — R0600 Dyspnea, unspecified: Secondary | ICD-10-CM | POA: Insufficient documentation

## 2013-04-28 DIAGNOSIS — Z720 Tobacco use: Secondary | ICD-10-CM

## 2013-04-28 DIAGNOSIS — R42 Dizziness and giddiness: Secondary | ICD-10-CM

## 2013-04-28 DIAGNOSIS — R0609 Other forms of dyspnea: Secondary | ICD-10-CM | POA: Insufficient documentation

## 2013-04-28 DIAGNOSIS — J449 Chronic obstructive pulmonary disease, unspecified: Secondary | ICD-10-CM | POA: Diagnosis not present

## 2013-04-28 LAB — CBC WITH DIFFERENTIAL/PLATELET
Basophils Absolute: 0.1 10*3/uL (ref 0.0–0.1)
HCT: 43 % (ref 39.0–52.0)
Lymphocytes Relative: 16 % (ref 12–46)
Monocytes Absolute: 0.6 10*3/uL (ref 0.1–1.0)
Neutro Abs: 6.1 10*3/uL (ref 1.7–7.7)
Neutrophils Relative %: 75 % (ref 43–77)
RDW: 13.5 % (ref 11.5–15.5)
WBC: 8 10*3/uL (ref 4.0–10.5)

## 2013-04-28 LAB — BASIC METABOLIC PANEL
BUN: 19 mg/dL (ref 6–23)
Chloride: 96 mEq/L (ref 96–112)
Potassium: 5 mEq/L (ref 3.5–5.3)
Sodium: 134 mEq/L — ABNORMAL LOW (ref 135–145)

## 2013-04-28 NOTE — Assessment & Plan Note (Signed)
Differentials, COPD, CHF ( mild diastolic on Echo 1610), deconditioning. Oxygen sat did not meet criteria today for home oxygen Start work up, may need pulmonary referral

## 2013-04-28 NOTE — Progress Notes (Signed)
  Subjective:    Patient ID: Samuel Flores, male    DOB: 16-Jan-1941, 72 y.o.   MRN: [id number]  HPI  Pt here with continued SOB, diagnosed with COPD exacerbation in April, given script for spiriva, but unable to afford. Quit smoking 1 week ago. Was admitted for non obstructive CAD in March. Echo showed mild diastolic HF. Uses albuterol as needed which helps minimally. Has not had PFT. +Cough without production. Feels SOB with minimal exertion. Also gets dizzy time to time at home, after sitting to stand, feels weak, appetite has not picked up Needs handicap sticker Records Reviewed Meds reviewed  Review of Systems  GEN- + fatigue, fever, weight loss,weakness, recent illness HEENT- denies eye drainage, change in vision, nasal discharge, CVS- denies chest pain, palpitations RESP- + SOB, +cough, wheeze ABD- denies N/V, change in stools, abd pain Neuro- denies headache, +dizziness, syncope, seizure activity      Objective:   Physical Exam GEN- NAD, alert and oriented x3 HEENT- PERRL, EOMI, non injected sclera, pink conjunctiva, MMM, oropharynx clear Neck- Supple, no JVD CVS- RRR, no murmur RESP-CTAB, no wheeze, no rhonchi, fair air movement, Oxygen sat 89% with ambulation EXT- No edema Pulses- Radial 2+ Neuro- CNII-XII in tact, no focal deficits        Assessment & Plan:

## 2013-04-28 NOTE — Patient Instructions (Signed)
Decrease enalapril to 1 tablet daily Continue all other medications  PFT lung test to be done  We will call with lab results  F/U 1 month for blood pressure

## 2013-04-28 NOTE — Assessment & Plan Note (Signed)
BP has been trending on lower end, likley cause of dizziness, decrease ACEI to 20mg  daily, recheck 4 weeks

## 2013-04-28 NOTE — Assessment & Plan Note (Signed)
encourage increased po intake, BP on lower end may be orthostatic at home, decrease ACEI to 20mg 

## 2013-04-28 NOTE — Assessment & Plan Note (Signed)
Recently quit cold [place], hopefully he can stick to this

## 2013-04-28 NOTE — Assessment & Plan Note (Signed)
Recent diagnosis though I see no PFT, CXR showed no hyperinflation, will obtain PFT Given advair 100/50 as this was the only sample we had, will use BID I doubt he is getting much use out of his bronchodilator

## 2013-04-29 ENCOUNTER — Telehealth: Payer: Self-pay | Admitting: Family Medicine

## 2013-04-29 NOTE — Telephone Encounter (Signed)
Faxed referral over to [facility]

## 2013-05-15 ENCOUNTER — Ambulatory Visit (INDEPENDENT_AMBULATORY_CARE_PROVIDER_SITE_OTHER): Payer: Medicare Other | Admitting: Internal Medicine

## 2013-05-15 DIAGNOSIS — R0609 Other forms of dyspnea: Secondary | ICD-10-CM

## 2013-05-15 DIAGNOSIS — J449 Chronic obstructive pulmonary disease, unspecified: Secondary | ICD-10-CM

## 2013-05-15 DIAGNOSIS — R0989 Other specified symptoms and signs involving the circulatory and respiratory systems: Secondary | ICD-10-CM | POA: Diagnosis not present

## 2013-05-15 LAB — PULMONARY FUNCTION TEST

## 2013-05-15 NOTE — Progress Notes (Signed)
PFT done today. 

## 2013-05-26 ENCOUNTER — Encounter: Payer: Self-pay | Admitting: Family Medicine

## 2013-05-26 NOTE — Progress Notes (Signed)
  Subjective:    Patient ID: Samuel Flores, male    DOB: 1941-04-17, 72 y.o.   MRN: [id number]  HPI  PFT are normal with mild response with bronchodilator he could  Have some mild RAD. HIS DOE could just be related to his CAD. CXR was normal We will have him f/u in clinic this week and see how he responded to the advair. No true COPD seen  Review of Systems     Objective:   Physical Exam        Assessment & Plan:

## 2013-05-28 ENCOUNTER — Encounter: Payer: Self-pay | Admitting: Family Medicine

## 2013-05-28 ENCOUNTER — Ambulatory Visit (INDEPENDENT_AMBULATORY_CARE_PROVIDER_SITE_OTHER): Payer: Medicare Other | Admitting: Family Medicine

## 2013-05-28 VITALS — BP 120/82 | HR 62 | Temp 97.3°F | Resp 14 | Wt 168.0 lb

## 2013-05-28 DIAGNOSIS — R0989 Other specified symptoms and signs involving the circulatory and respiratory systems: Secondary | ICD-10-CM | POA: Diagnosis not present

## 2013-05-28 DIAGNOSIS — F17200 Nicotine dependence, unspecified, uncomplicated: Secondary | ICD-10-CM

## 2013-05-28 DIAGNOSIS — R0600 Dyspnea, unspecified: Secondary | ICD-10-CM

## 2013-05-28 DIAGNOSIS — I2789 Other specified pulmonary heart diseases: Secondary | ICD-10-CM | POA: Diagnosis not present

## 2013-05-28 DIAGNOSIS — R0609 Other forms of dyspnea: Secondary | ICD-10-CM

## 2013-05-28 DIAGNOSIS — I272 Pulmonary hypertension, unspecified: Secondary | ICD-10-CM

## 2013-05-28 DIAGNOSIS — Z720 Tobacco use: Secondary | ICD-10-CM

## 2013-05-28 NOTE — Patient Instructions (Signed)
Referral to lung doctor  We will send results of testing Stop the inhalers  F/U as previous with [facility]

## 2013-05-28 NOTE — Assessment & Plan Note (Signed)
Persistent dyspnea on exertion. Workup thus far including chest x-ray and PFT show no abnormality. He has nonobstructive coronary artery disease which is being followed by his cardiologist he had a cardiac catheterization done at the end of February which no intervention was needed.  His discharge and cardiology notes were reviewed he has stress test in 2012 which showed an EF of 46%. He had an echocardiogram done in 2012 which showed an EF of 50% with moderate pulmonary hypertension Grade 1 diastolic dysfunction noted on his echocardiogram in 2011. He last saw his cardiologist at the end of February and was told nothing further was needed for his heart disease and shortness of breath however he still quite symptomatic. I will for him to pulmonary is possible that pulmonary hypertension is causing his shortness of breath versus other underlying lung pathology as well as deconditioning  I will stop the inhalers for now as they're not helping. He may benefit from a walk test at the pulmonology office

## 2013-05-28 NOTE — Assessment & Plan Note (Signed)
Noted in his cardiac work-up Defer to pulmonary for any recommendations

## 2013-05-28 NOTE — Assessment & Plan Note (Signed)
40 days tobacco free

## 2013-05-28 NOTE — Progress Notes (Signed)
  Subjective:    Patient ID: Samuel Flores, male    DOB: [birth year], 72 y.o.   MRN: [id number]  HPI  Patient here to followup dyspnea on exertion. At her last visit one month ago there was concern for COPD as he has a history of smoking and has been short of breath. He was treated for bronchitis couple months ago. Chest x-ray was obtained which was negative. Pulmonary function test was done which showed normal flow loops he had very minimal response to bronchodilator and no evidence of COPD. At our last visit 4 weeks ago he was given Advair as a trial sample however this did not improve his symptoms his albuterol also did not improve his symptoms. He gets short of breath walking to the mailbox he is also short of breath after taking a shower he is able to ride a mower however he is unable to do any walking activities such as trimming or itching in his yard without shortness of breath. He quit smoking 40 days ago cold [place].   Review of Systems   GEN- denies fatigue, fever, weight loss,weakness, recent illness HEENT- denies eye drainage, change in vision, nasal discharge, CVS- denies chest pain, palpitations RESP- denies SOB, cough, wheeze ABD- denies N/V, change in stools, abd pain GU- denies dysuria, hematuria, dribbling, incontinence MSK- denies joint pain, muscle aches, injury Neuro- denies headache, dizziness, syncope, seizure activity      Objective:   Physical Exam  GEN- NAD, alert and oriented x3 HEENT- PERRL, EOMI, non injected sclera, pink conjunctiva, MMM, oropharynx clear Neck- Supple, no JVD CVS- RRR, no murmur RESP-CTAB, no wheeze, no rhonchi, good air movement EXT- No edema Pulses- Radial 2+        Assessment & Plan:

## 2013-05-30 ENCOUNTER — Telehealth: Payer: Self-pay | Admitting: Physician Assistant

## 2013-05-30 ENCOUNTER — Institutional Professional Consult (permissible substitution): Payer: Medicare Other | Admitting: Internal Medicine

## 2013-05-30 DIAGNOSIS — I10 Essential (primary) hypertension: Secondary | ICD-10-CM

## 2013-05-30 MED ORDER — HYDROCHLOROTHIAZIDE 12.5 MG PO CAPS: 12.5000 mg | ORAL_CAPSULE | Freq: Every day | ORAL | Status: DC

## 2013-05-30 NOTE — Telephone Encounter (Signed)
Medication refilled per protocol. 

## 2013-06-02 ENCOUNTER — Ambulatory Visit (INDEPENDENT_AMBULATORY_CARE_PROVIDER_SITE_OTHER): Payer: Medicare Other | Admitting: Internal Medicine

## 2013-06-02 ENCOUNTER — Ambulatory Visit (INDEPENDENT_AMBULATORY_CARE_PROVIDER_SITE_OTHER)
Admission: RE | Admit: 2013-06-02 | Discharge: 2013-06-02 | Disposition: A | Discharge status: Home / Self Care | Payer: Medicare Other | Source: Ambulatory Visit | Attending: Internal Medicine | Admitting: Internal Medicine

## 2013-06-02 ENCOUNTER — Encounter: Payer: Self-pay | Admitting: Internal Medicine

## 2013-06-02 VITALS — BP 106/60 | HR 82 | Temp 97.7°F | Ht 69.0 in | Wt 168.0 lb

## 2013-06-02 DIAGNOSIS — R0602 Shortness of breath: Secondary | ICD-10-CM

## 2013-06-02 DIAGNOSIS — R0989 Other specified symptoms and signs involving the circulatory and respiratory systems: Secondary | ICD-10-CM

## 2013-06-02 DIAGNOSIS — R0609 Other forms of dyspnea: Secondary | ICD-10-CM

## 2013-06-02 DIAGNOSIS — I10 Essential (primary) hypertension: Secondary | ICD-10-CM

## 2013-06-02 MED ORDER — OLMESARTAN MEDOXOMIL-HCTZ 40-25 MG PO TABS: ORAL_TABLET | ORAL | Status: DC

## 2013-06-02 NOTE — Assessment & Plan Note (Addendum)
-   06/02/2013  Walked RA x 3 laps @ 185 ft each stopped due to end of study no desat    Symptoms are markedly disproportionate to objective findings and not clear this is a lung problem but pt does appear to have difficult airway management issues. DDX of  difficult airways managment all start with A and  include Adherence, Ace Inhibitors, Acid Reflux, Active Sinus Disease, Alpha 1 Antitripsin deficiency, Anxiety masquerading as Airways dz,  ABPA,  allergy(esp in young), Aspiration (esp in elderly), Adverse effects of DPI,  Active smokers, plus two Bs  = Bronchiectasis and Beta blocker use..and one C= CHF   acei effect is at the top of the list of "usual suspects" in this patient with non-specific and not reproducible syptoms > see hbp  Anxiety a dx of exclusion  ? CHF > does have G I diast dysfunction with PAH on prev echo but strongly doubt primary pulmonary hypertension but even so this wouldn't explain the abupt onset of persistent (unchanged) doe even with optimal bp control.   May require cpst to sort this out if not better off acei x 6 week trial.

## 2013-06-02 NOTE — Assessment & Plan Note (Signed)
ACE inhibitors are problematic in  pts with airway complaints because  even experienced pulmonologists can't always distinguish ace effects from copd/asthma.  By themselves they don't actually cause a problem, much like oxygen can't by itself start a fire, but they certainly serve as a powerful catalyst or enhancer for any "fire"  or inflammatory process in the upper airway, be it caused by an ET  tube or more commonly reflux (especially in the obese or pts with known GERD or who are on biphoshonates).    In the era of ARB near equivalency (at least in short run) until we have a better handle on the reversibility of the airway problem, it just makes sense to avoid ACEI  entirely in the short run and then decide later, having established a level of airway control using a reasonable limited regimen, whether to add back ace but even then being very careful to observe the pt for worsening airway control and number of meds used/ needed to control symptoms.    For now stop vasotec and hctz and use benicar 40/25 one half daily

## 2013-06-02 NOTE — Progress Notes (Signed)
  Subjective:    Patient ID: Samuel Flores, male    DOB: [birth year]  MRN: [id number]  HPI  65 yowm heavy pipe smoker quit around April 13 2013 referred 06/02/2013 to pulmonary clinic by Dr Jeanice Lim at [facility] for eval of sob.   06/02/2013 1st pulmonary eval/ Wert cc onset doe in February 2014 brought on by washing golf carts > to ER with cp also resulting in cardiac w/u > neg LHC Gangi with LVEDP 6  > since abrupt onset daily doe x one flight of steps, walking on flat surface like [employer] but  min progression, no better on advair or ventolin.   Coughing at onset better since quit smoking   No obvious daytime variabilty or assoc chronic cough or cp or chest tightness, subjective wheeze overt sinus or hb symptoms. No unusual exp hx or h/o childhood pna/ asthma or knowledge of premature birth.   Sleeping ok without nocturnal  or early am exacerbation  of respiratory  c/o's or need for noct saba. Also denies any obvious fluctuation of symptoms with weather or environmental changes or other aggravating or alleviating factors except as outlined above   Review of Systems  Constitutional: Negative for fever, chills, activity change, appetite change and unexpected weight change.  HENT: Negative for congestion, sore throat, rhinorrhea, sneezing, trouble swallowing, dental problem, voice change and postnasal drip.   Eyes: Negative for visual disturbance.  Respiratory: Positive for shortness of breath. Negative for cough and choking.   Cardiovascular: Negative for chest pain and leg swelling.  Gastrointestinal: Negative for nausea, vomiting and abdominal pain.  Genitourinary: Negative for difficulty urinating.  Musculoskeletal: Negative for arthralgias.  Skin: Negative for rash.  Psychiatric/Behavioral: Negative for behavioral problems and confusion.       Objective:   Physical Exam   Pleasant amb wm nad min pseudowheeze  Wt Readings from Last 3 Encounters:  06/02/13 168 lb (76.204 kg)   05/28/13 168 lb (76.204 kg)  04/28/13 165 lb (74.844 kg)     HEENT mild turbinate edema.  Oropharynx no thrush or excess pnd or cobblestoning.  No JVD or cervical adenopathy. Mild accessory muscle hypertrophy. Trachea midline, nl thryroid. Chest was hyperinflated by percussion with diminished breath sounds and moderate increased exp time without wheeze. Hoover sign positive at mid inspiration. Regular rate and rhythm without murmur gallop or rub or increase P2 or edema.  Abd: no hsm, nl excursion. Ext warm without cyanosis or clubbing.        CXR  06/02/2013 :   Stable hyperinflation. No acute findings      Assessment & Plan:

## 2013-06-02 NOTE — Patient Instructions (Addendum)
Benicar 40 /25  One half daily in place of the vasotec and the hydrochlorothiazide - if light headed on this stop your amlodipine (norvasc)   Please remember to go to the   x-ray department downstairs for your tests - we will call you with the results when they are available.    Please schedule a follow up office visit in 6 weeks, call sooner if needed

## 2013-06-04 NOTE — Progress Notes (Signed)
Quick Note:  Spoke with pt and notified of results per Dr. Wert. Pt verbalized understanding and denied any questions.  ______ 

## 2013-07-15 ENCOUNTER — Ambulatory Visit (INDEPENDENT_AMBULATORY_CARE_PROVIDER_SITE_OTHER): Payer: Medicare Other | Admitting: Internal Medicine

## 2013-07-15 ENCOUNTER — Encounter: Payer: Self-pay | Admitting: Internal Medicine

## 2013-07-15 VITALS — BP 120/70 | HR 74 | Ht 69.0 in | Wt 172.0 lb

## 2013-07-15 DIAGNOSIS — I10 Essential (primary) hypertension: Secondary | ICD-10-CM | POA: Diagnosis not present

## 2013-07-15 DIAGNOSIS — R0609 Other forms of dyspnea: Secondary | ICD-10-CM | POA: Diagnosis not present

## 2013-07-15 HISTORY — DX: Essential (primary) hypertension: I10

## 2013-07-15 MED ORDER — ATENOLOL 50 MG PO TABS: ORAL_TABLET | ORAL | Status: DC

## 2013-07-15 NOTE — Patient Instructions (Addendum)
Reduce atenolol to one half daily   Try prilosec 20mg   Take 30-60 min before first meal of the day and Pepcid 20 mg one bedtime until you have your test.  Please see patient coordinator before you leave today  to schedule a cpst  Please schedule a follow up office visit in 6 weeks, call sooner if needed

## 2013-07-15 NOTE — Assessment & Plan Note (Signed)
12/31/09 Echo - Left ventricle: The cavity size was normal. Wall thickness was normal. Systolic function was normal. The estimated ejection fraction was in the range of 50% to 55%. Regional wall motion abnormalities cannot be excluded. Doppler parameters are consistent with abnormal left ventricular relaxation (grade 1 diastolic dysfunction). - Mitral valve: Mild regurgitation. - Left atrium: The atrium was mildly dilated  Trial off acei rec 06/02/2013   Until sort out cause of his atypical sob best to leave acei off for now, samples given

## 2013-07-15 NOTE — Progress Notes (Signed)
  Subjective:    Patient ID: Samuel Flores, male    DOB: Jul 18, 1941  MRN: [id number]    Brief patient profile:  70 yowm heavy pipe smoker quit around April 13 2013 referred 06/02/2013 to pulmonary clinic by Dr Jeanice Lim at [facility] for eval of sob.  HPI 06/02/2013 1st pulmonary eval/ Samuel Flores cc onset doe in February 2014 brought on by washing golf carts > to ER with cp also resulting in cardiac w/u > neg LHC Gangi with LVEDP 6  > since abrupt onset daily doe x one flight of steps, walking on flat surface like [employer] but  min progression, no better on advair or ventolin.   Coughing at onset better since quit smoking. rec Benicar 40 /25  One half daily in place of the vasotec and the hydrochlorothiazide   07/15/2013 f/u ov/Samuel Flores  Chief Complaint  Patient presents with  . Shortness of Breath    Breathing is unchanged. Reports SOB, chest tightness. Denies coughing or wheezing.  sob "with anything" but much worse in heat or uphills  No obvious daytime variabilty or assoc chronic cough or cp or chest tightness, subjective wheeze overt sinus or hb symptoms. No unusual exp hx or h/o childhood pna/ asthma or knowledge of premature birth.   Sleeping ok without nocturnal  or early am exacerbation  of respiratory  c/o's or need for noct saba. Also denies any obvious fluctuation of symptoms with weather or environmental changes or other aggravating or alleviating factors except as outlined above   Current Medications, Allergies, Past Medical History, Past Surgical History, Family History, and Social History were reviewed in [employer] record.  ROS  The following are not active complaints unless bolded sore throat, dysphagia, dental problems, itching, sneezing,  nasal congestion or excess/ purulent secretions, ear ache,   fever, chills, sweats, unintended wt loss, pleuritic or exertional cp, hemoptysis,  orthopnea pnd or leg swelling, presyncope, palpitations, heartburn, abdominal pain,  anorexia, nausea, vomiting, diarrhea  or change in bowel or urinary habits, change in stools or urine, dysuria,hematuria,  rash, arthralgias, visual complaints, headache, numbness weakness or ataxia or problems with walking or coordination,  change in mood/affect or memory.           Objective:   Physical Exam   Pleasant amb wm nad min pseudowheeze   07/15/2013          172  Wt Readings from Last 3 Encounters:  06/02/13 168 lb (76.204 kg)  05/28/13 168 lb (76.204 kg)  04/28/13 165 lb (74.844 kg)     HEENT: nl dentition, turbinates, and orophanx. Nl external ear canals without cough reflex   NECK :  without JVD/Nodes/TM/ nl carotid upstrokes bilaterally   LUNGS: no acc muscle use, clear to A and P bilaterally without cough on insp or exp maneuvers   CV:  RRR  no s3 or murmur or increase in P2, no edema   ABD:  soft and nontender with nl excursion in the supine position. No bruits or organomegaly, bowel sounds nl  MS:  warm without deformities, calf tenderness, cyanosis or clubbing  SKIN: warm and dry without lesions    NEURO:  alert, approp, no deficits          CXR  06/02/2013 :   Stable hyperinflation. No acute findings      Assessment & Plan:

## 2013-07-15 NOTE — Assessment & Plan Note (Addendum)
12/31/09 Echo - Left ventricle: The cavity size was normal. Wall thickness was normal. Systolic function was normal. The estimated ejection fraction was in the range of 50% to 55%. Regional wall motion abnormalities cannot be excluded. Doppler parameters are consistent with abnormal left ventricular relaxation (grade 1 diastolic dysfunction). - Mitral valve: Mild regurgitation. - Left atrium: The atrium was mildly dilated  Trial off acei rec 06/02/2013   Until sort out cause of his atypical sob best to leave acei off for now, samples given, and also reduce atenolol by one half as may have chronotropic incompetence contributing to symptoms

## 2013-07-15 NOTE — Assessment & Plan Note (Signed)
-   PFT's 05/15/13 FEV1  3.45 (115%) ratio 75 and DLCO 61 and 59% - 06/02/2013  Walked RA x 3 laps @ 185 ft each stopped due to end of study no desat   - trial off acei rec 06/02/2013  - 07/15/2013  Walked RA x 3 laps @ 185 ft each stopped due to end of study, no sob, no sat, very rapid walk  Not able to reproduce his symptom of "sob with anything" here in office, so next step is cpst though would like his to stay off the acei and add gerd rx for at least 2 weeks prior to the study.  See instructions for specific recommendations which were reviewed directly with the patient who was given a copy with highlighter outlining the key components.

## 2013-07-29 ENCOUNTER — Ambulatory Visit (HOSPITAL_COMMUNITY): Payer: Medicare Other | Attending: Internal Medicine

## 2013-07-29 ENCOUNTER — Encounter: Payer: Self-pay | Admitting: Internal Medicine

## 2013-08-11 ENCOUNTER — Encounter: Payer: Self-pay | Admitting: Internal Medicine

## 2013-08-18 ENCOUNTER — Encounter: Payer: Self-pay | Admitting: Physician Assistant

## 2013-08-18 ENCOUNTER — Ambulatory Visit (INDEPENDENT_AMBULATORY_CARE_PROVIDER_SITE_OTHER): Payer: Medicare Other | Admitting: Physician Assistant

## 2013-08-18 VITALS — BP 86/54 | HR 46 | Temp 97.4°F | Resp 18 | Ht 68.0 in | Wt 174.0 lb

## 2013-08-18 DIAGNOSIS — M109 Gout, unspecified: Secondary | ICD-10-CM

## 2013-08-18 DIAGNOSIS — E7800 Pure hypercholesterolemia, unspecified: Secondary | ICD-10-CM

## 2013-08-18 DIAGNOSIS — I10 Essential (primary) hypertension: Secondary | ICD-10-CM | POA: Diagnosis not present

## 2013-08-18 DIAGNOSIS — F1010 Alcohol abuse, uncomplicated: Secondary | ICD-10-CM

## 2013-08-18 DIAGNOSIS — I959 Hypotension, unspecified: Secondary | ICD-10-CM

## 2013-08-18 DIAGNOSIS — Z720 Tobacco use: Secondary | ICD-10-CM

## 2013-08-18 DIAGNOSIS — C801 Malignant (primary) neoplasm, unspecified: Secondary | ICD-10-CM

## 2013-08-18 DIAGNOSIS — I2789 Other specified pulmonary heart diseases: Secondary | ICD-10-CM

## 2013-08-18 DIAGNOSIS — R0609 Other forms of dyspnea: Secondary | ICD-10-CM | POA: Diagnosis not present

## 2013-08-18 DIAGNOSIS — F17200 Nicotine dependence, unspecified, uncomplicated: Secondary | ICD-10-CM

## 2013-08-18 DIAGNOSIS — Z8679 Personal history of other diseases of the circulatory system: Secondary | ICD-10-CM

## 2013-08-18 DIAGNOSIS — I272 Pulmonary hypertension, unspecified: Secondary | ICD-10-CM

## 2013-08-18 HISTORY — DX: Alcohol abuse, uncomplicated: F10.10

## 2013-08-18 LAB — COMPLETE METABOLIC PANEL WITH GFR
ALT: 63 U/L — ABNORMAL HIGH (ref 0–53)
AST: 75 U/L — ABNORMAL HIGH (ref 0–37)
Alkaline Phosphatase: 92 U/L (ref 39–117)
Creat: 1.03 mg/dL (ref 0.50–1.35)
Sodium: 144 mEq/L (ref 135–145)
Total Bilirubin: 1 mg/dL (ref 0.3–1.2)
Total Protein: 6.5 g/dL (ref 6.0–8.3)

## 2013-08-18 MED ORDER — OLMESARTAN MEDOXOMIL 20 MG PO TABS: 20.0000 mg | ORAL_TABLET | Freq: Every day | ORAL | Status: DC

## 2013-08-18 NOTE — Progress Notes (Signed)
Patient ID: Samuel Flores MRN: [id number], DOB: 28-Dec-1940, 72 y.o. Date of Encounter: @DATE @  Chief Complaint:  Chief Complaint  Patient presents with  . 6 mth check up    not fasting    HPI: 72 y.o. year old white male  presents for routine followup office visit.  He reports that he has been seeing Dr. Sherene Sires at pulmonary and also seeing Dr. Jacinto Halim cardiology for evaluation of shortness of breath. Says he just saw Dr. Sherene Sires 07/15/13 and his next appointment with him in one week. Says his next appointment Dr. Jacinto Halim is in February.   He says that Dr. Sherene Sires decreased the following medications in half all at once about 2 months ago. Decreased the Lipitor from 20 mg to half of this. Decrease atenolol 50 mg to half a pill. Decrease Benicar HCT 40/25 to half of a pill daily. He is taking a whole amlodipine 10 mg daily. Says that this one was not cut in half.  Patient reports that he is having no lightheadedness or presyncope.  Taking Lipitor with no problems with myalgias or other adverse effects.   Past Medical History  Diagnosis Date  . Hypertension   . Hypercholesteremia   . Cancer 1994    prostate  . Gout   . Arthritis     osteoarthritis  . Hx-TIA (transient ischemic attack) 2011  . 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 CAD (coronary artery disease)     Non obstructive  . Diastolic dysfunction Echo 2012    Grade 1  . Alcohol abuse, daily use 08/18/2013     Home Meds: See attached medication section for current medication list. Any medications entered into computer today will not appear on this note's list. The medications listed below were entered prior to today. Current Outpatient Prescriptions on File Prior to Visit  Medication Sig Dispense Refill  . albuterol (PROVENTIL HFA;VENTOLIN HFA) 108 (90 BASE) MCG/ACT inhaler Inhale 2 puffs into the lungs every 6 (six) hours as needed for wheezing (1-2 puffs every 4-6 hrs prn).  1 Inhaler  2  . aspirin EC 81 MG tablet Take 81 mg by  mouth daily.      Marland Kitchen atenolol (TENORMIN) 50 MG tablet On half daily      . atorvastatin (LIPITOR) 20 MG tablet Take 10 mg by mouth daily.      . nitroGLYCERIN (NITROSTAT) 0.4 MG SL tablet Place 1 tablet (0.4 mg total) under the tongue every 5 (five) minutes x 3 doses as needed for chest pain.  30 tablet  4   No current facility-administered medications on file prior to visit.    Allergies: No Known Allergies  History   Social History  . Marital Status: Married    Spouse Name: N/A    Number of Children: N/A  . Years of Education: N/A   Occupational History  . Previous work at [employer] course    Social History Main Topics  . Smoking status: Former Smoker -- 39 years    Types: Pipe  . Smokeless tobacco: Never Used  . Alcohol Use: 33.6 oz/week    14 Cans of beer, 42 Shots of liquor per week  . Drug Use: No  . Sexual Activity: No   Other Topics Concern  . Not on file   Social History Narrative  . No narrative on file    Family History  Problem Relation Age of Onset  . Lung cancer Mother     never smoker  .  Prostate cancer Father      Review of Systems:  See HPI for pertinent ROS. All other ROS negative.    Physical Exam: Blood pressure 86/54, pulse 46, temperature 97.4 F (36.3 C), temperature source Oral, resp. rate 18, height 5\' 8"  (1.727 m), weight 174 lb (78.926 kg)., Body mass index is 26.46 kg/(m^2). Repeat blood pressure by me on the right is 90/52 with multiple checks. General: Well nourished, well-developed white male. Appears in no acute distress. Neck: Supple. No thyromegaly. No lymphadenopathy. No carotid bruits. Lungs: Clear bilaterally to auscultation without wheezes, rales, or rhonchi. Breathing is unlabored. Heart: RRR with S1 S2. No murmurs, rubs, or gallops. Abdomen: Soft, non-tender, non-distended with normoactive bowel sounds. No hepatomegaly. No rebound/guarding. No obvious abdominal masses. Musculoskeletal:  Strength and tone normal for  age. Extremities/Skin: Warm and dry. No clubbing or cyanosis. No edema. No rashes or suspicious lesions. Neuro: Alert and oriented X 3. Moves all extremities spontaneously. Gait is normal. CNII-XII grossly in tact. Psych:  Responds to questions appropriately with a normal affect.     ASSESSMENT AND PLAN:  72 y.o. year old male with  1. Hypercholesteremia Patient is not fasting today for cannot recheck lipid panel. Last lipid panel was August 2013 at which time it was at goal.  However Lipitor was cut in half about 2 months ago by Dr. Sherene Sires according to the patient. Will need recheck FLP  when he is fasting. - COMPLETE METABOLIC PANEL WITH GFR  2. HBP (high blood pressure) I am getting low blood pressure reading on multiple checks. Stop his current Benicar HCT and change to Benicar 20 mg.  He reports he has an appointment with Dr. Sherene Sires in 1 week. Told patient to specifically ask the nurse what his blood pressure reading is when he is at that appointment. If the blood pressure is either too low or too high he can either discuss the management with Dr. Sherene Sires that day or followup with me regarding this. - olmesartan (BENICAR) 20 MG tablet; Take 1 tablet (20 mg total) by mouth daily.  Dispense: 30 tablet; Refill: 3 - COMPLETE METABOLIC PANEL WITH GFR  3. Hypotension, unspecified See #2 above - olmesartan (BENICAR) 20 MG tablet; Take 1 tablet (20 mg total) by mouth daily.  Dispense: 30 tablet; Refill: 3 - COMPLETE METABOLIC PANEL WITH GFR  4. DOE (dyspnea on exertion) Workup per Dr. Jacinto Halim and Dr. Sherene Sires.  5. Tobacco use Patient reports that he quit smoking Memorial Day of this year which is May 2014.  6. Pulmonary hypertension  7. Alcohol abuse, daily use Specifically ask patient about his alcohol intake today. He reports that it is the same which is 6-8 ounces of bourbon each night. No other alcohol additionally. Discussed with him that his AST was elevated on last lab secondary to liver  damage from alcohol. Discussed need for decrease / cessation again today. - COMPLETE METABOLIC PANEL WITH GFR  8. H/OCancer History of prostate cancer 1994. History of prostatectomy at that time. Patient refuses followup with urology or oncology.  #9 screening colonoscopy: He had one remotely. He refuses followup. Aware of risk of cancer.  10. Minimizations:  He defers the influenza vaccine and  also defers tetanus vaccine. He received Zostavax here April 2013.   9. Gout Flares since 2011   Lab review:  To/19/14 he had TSH which was normal 01/01/13 A1c was 5.5 01/01/13 CMET was normal except for AST [address]   Signed, [address] [place], [place], [address]  08/18/2013 12:48 PM

## 2013-08-26 ENCOUNTER — Ambulatory Visit (INDEPENDENT_AMBULATORY_CARE_PROVIDER_SITE_OTHER): Payer: Medicare Other | Admitting: Internal Medicine

## 2013-08-26 ENCOUNTER — Encounter: Payer: Self-pay | Admitting: Internal Medicine

## 2013-08-26 VITALS — BP 120/82 | HR 62 | Temp 97.9°F | Ht 69.0 in | Wt 174.8 lb

## 2013-08-26 DIAGNOSIS — R42 Dizziness and giddiness: Secondary | ICD-10-CM | POA: Diagnosis not present

## 2013-08-26 DIAGNOSIS — I10 Essential (primary) hypertension: Secondary | ICD-10-CM

## 2013-08-26 DIAGNOSIS — R0609 Other forms of dyspnea: Secondary | ICD-10-CM

## 2013-08-26 DIAGNOSIS — R008 Other abnormalities of heart beat: Secondary | ICD-10-CM

## 2013-08-26 DIAGNOSIS — R012 Other cardiac sounds: Secondary | ICD-10-CM | POA: Diagnosis not present

## 2013-08-26 NOTE — Progress Notes (Signed)
Subjective:    Patient ID: Samuel Flores, male    DOB: 09-13-1941  MRN: [id number]    Brief patient profile:  68 yowm heavy pipe smoker quit around April 13 2013 referred 06/02/2013 to pulmonary clinic by Dr Samuel Flores at [facility] for eval of sob.  HPI 06/02/2013 1st pulmonary eval/ Samuel Flores cc onset doe in February 2014 brought on by washing golf carts > to ER with cp also resulting in cardiac w/u > neg LHC Samuel Flores with LVEDP 6  > since abrupt onset daily doe x one flight of steps, walking on flat surface like [employer] but  min progression, no better on advair or ventolin.   Coughing at onset better since quit smoking. rec Benicar 40 /25  One half daily in place of the vasotec and the hydrochlorothiazide   07/15/2013 f/u ov/Samuel Flores re Chief Complaint  Patient presents with  . Shortness of Breath    Breathing is unchanged. Reports SOB, chest tightness. Denies coughing or wheezing.  sob "with anything" but much worse in heat or uphills rec Reduce atenolol to one half daily  Try prilosec 20mg   Take 30-60 min before first meal of the day and Pepcid 20 mg one bedtime until you have your test.   07/29/13 unable to do cpst ? Panic attack vs bigeminy ("reproduced the symptoms per [facility])   08/26/2013 f/u ov/Samuel Flores re: sob x feb 2014/ not clear compliant with gerd recs Chief Complaint  Patient presents with  . Follow-up    Still having SOB and tightness in chest x4 months   sob occurs  at rest x secs at a time Walks about 300 ft, worse if on incline.  Worse when dry off after shower  No obvious daytime variabilty or assoc chronic cough or cp or chest tightness, subjective wheeze overt sinus or hb symptoms. No unusual exp hx or h/o childhood pna/ asthma or knowledge of premature birth.   Sleeping ok without nocturnal  or early am exacerbation  of respiratory  c/o's or need for noct saba. Also denies any obvious fluctuation of symptoms with weather or environmental changes or other aggravating or  alleviating factors except as outlined above   Current Medications, Allergies, Past Medical History, Past Surgical History, Family History, and Social History were reviewed in [employer] record.  ROS  The following are not active complaints unless bolded sore throat, dysphagia, dental problems, itching, sneezing,  nasal congestion or excess/ purulent secretions, ear ache,   fever, chills, sweats, unintended wt loss, pleuritic or exertional cp, hemoptysis,  orthopnea pnd or leg swelling, presyncope, palpitations, heartburn, abdominal pain, anorexia, nausea, vomiting, diarrhea  or change in bowel or urinary habits, change in stools or urine, dysuria,hematuria,  rash, arthralgias, visual complaints, headache, numbness weakness or ataxia or problems with walking or coordination,  change in mood/affect or memory.           Objective:   Physical Exam   Pleasant amb wm nad  No longer pseudowheeze  07/15/2013          172 > 174 08/27/2013  Wt Readings from Last 3 Encounters:  06/02/13 168 lb (76.204 kg)  05/28/13 168 lb (76.204 kg)  04/28/13 165 lb (74.844 kg)     HEENT: nl dentition, turbinates, and orophanx. Nl external ear canals without cough reflex   NECK :  without JVD/Nodes/TM/ nl carotid upstrokes bilaterally   LUNGS: no acc muscle use, clear to A and P bilaterally without cough on insp or exp  maneuvers   CV:  RRR  no s3 or murmur or increase in P2, no edema   ABD:  soft and nontender with nl excursion in the supine position. No bruits or organomegaly, bowel sounds nl  MS:  warm without deformities, calf tenderness, cyanosis or clubbing  SKIN: warm and dry without lesions    NEURO:  alert, approp, no deficits          CXR  06/02/2013 :   Stable hyperinflation. No acute findings      Assessment & Plan:

## 2013-08-26 NOTE — Patient Instructions (Addendum)
Prilosec 20mg   Take 30-60 min before first meal of the day and Pepcid 20 mg one bedtime consistently for a month to see to what extent if any this helps your breathing  Please see patient coordinator before you leave today  to schedule 48  hour holter then I will call you with the results with referral if needed  To get the most out of exercise, you need to be continuously aware that you are short of breath, but never out of breath, for 30 minutes daily. As you improve, it will actually be easier for you to do the same amount of exercise  in  30 minutes so always push to the level where you are short of breath.    No further pulmonar follow up is needed

## 2013-08-27 DIAGNOSIS — R008 Other abnormalities of heart beat: Secondary | ICD-10-CM | POA: Insufficient documentation

## 2013-08-27 NOTE — Assessment & Plan Note (Signed)
12/31/09 Echo - Left ventricle: The cavity size was normal. Wall thickness was normal. Systolic function was normal. The estimated ejection fraction was in the range of 50% to 55%. Regional wall motion abnormalities cannot be excluded. Doppler parameters are consistent with abnormal left ventricular relaxation (grade 1 diastolic dysfunction). - Mitral valve: Mild regurgitation. - Left atrium: The atrium was mildly dilated  Trial off acei rec 06/02/2013  07/15/2013 reduced dose of atenolol to 25 mg daily   Adequate control on present rx, reviewed > no change in rx needed  ? Need to increase atenolol or address bigeminy but note base HR low so next step is 48 h Holter

## 2013-08-27 NOTE — Assessment & Plan Note (Signed)
48 h holter ordered > ? F/u EP needed ?

## 2013-08-27 NOTE — Assessment & Plan Note (Signed)
-   See echo 12/31/09 > GI dias dysfx with mild MR - PFT's 05/15/13 FEV1  3.45 (115%) ratio 75 and DLCO 61 and 59% - 06/02/2013  Walked RA x 3 laps @ 185 ft each stopped due to end of study no desat   - trial off acei rec 06/02/2013  - 07/15/2013  Walked RA x 3 laps @ 185 ft each stopped due to end of study, no sob, no desat, very rapid walk - cpst 07/29/13 developed v ectopy after being hooked up and before starting ex which "reproduced symptoms" so study terminated and Dr Nadara Eaton notified  Pt states he has not heard from [facility] and note his LHC was fine so next step is holter monitor with diary to see if any of his daily symptoms correlate with ectopy.  However,  DDX of  difficult airways managment all start with A and  include Adherence, Ace Inhibitors, Acid Reflux, Active Sinus Disease, Alpha 1 Antitripsin deficiency, Anxiety masquerading as Airways dz,  ABPA,  allergy(esp in young), Aspiration (esp in elderly), Adverse effects of DPI,  Active smokers, plus two Bs  = Bronchiectasis and Beta blocker use..and one C= CHF  ? Acid (or non-acid) GERD > always difficult to exclude as up to 75% of pts in some series report no assoc GI/ Heartburn symptoms> rec max (24h)  acid suppression and diet restrictions/ reviewed and instructions given in writting   ? Anxiety disorder strongly suspected as not able to reproduce any of his symptoms here > referred back to primary care

## 2013-09-03 ENCOUNTER — Encounter (INDEPENDENT_AMBULATORY_CARE_PROVIDER_SITE_OTHER): Payer: Medicare Other

## 2013-09-03 ENCOUNTER — Encounter: Payer: Self-pay | Admitting: Radiology

## 2013-09-03 DIAGNOSIS — R42 Dizziness and giddiness: Secondary | ICD-10-CM

## 2013-09-03 NOTE — Progress Notes (Signed)
Patient ID: Samuel Flores, male   DOB: 07-Oct-1941, 72 y.o.   MRN: [id number] E Cardio 48 hr holter monitor applied

## 2013-10-07 ENCOUNTER — Ambulatory Visit (INDEPENDENT_AMBULATORY_CARE_PROVIDER_SITE_OTHER): Payer: Medicare Other | Admitting: Family Medicine

## 2013-10-07 ENCOUNTER — Encounter: Payer: Self-pay | Admitting: Family Medicine

## 2013-10-07 VITALS — BP 110/70 | HR 68 | Temp 97.8°F | Resp 18 | Ht 69.0 in | Wt 181.0 lb

## 2013-10-07 DIAGNOSIS — R0989 Other specified symptoms and signs involving the circulatory and respiratory systems: Secondary | ICD-10-CM

## 2013-10-07 DIAGNOSIS — I10 Essential (primary) hypertension: Secondary | ICD-10-CM | POA: Diagnosis not present

## 2013-10-07 DIAGNOSIS — R0609 Other forms of dyspnea: Secondary | ICD-10-CM | POA: Diagnosis not present

## 2013-10-07 DIAGNOSIS — R0600 Dyspnea, unspecified: Secondary | ICD-10-CM

## 2013-10-07 DIAGNOSIS — R609 Edema, unspecified: Secondary | ICD-10-CM

## 2013-10-07 MED ORDER — FUROSEMIDE 40 MG PO TABS: 40.0000 mg | ORAL_TABLET | Freq: Every day | ORAL | Status: DC

## 2013-10-07 NOTE — Assessment & Plan Note (Signed)
His blood pressure is well-controlled on 10 mg of Benicar. He's been given staples from the office as he has no prescription drug plan

## 2013-10-07 NOTE — Assessment & Plan Note (Signed)
Differentials of leg swelling include venous stasis, liver failure due to alcohol abuse, mild CHF he did have an echocardiogram in 2011 that I can see where he had normal ejection fraction but mild diastolic dysfunction. I will check a BNP. He has no cardiorespiratory distress at this time. I will put him on Lasix 40 mg daily for the next 5 days.. he will followup with cardiology which I have arranged today during the visit on December 8. Liver function and renal function will also be checked today

## 2013-10-07 NOTE — Assessment & Plan Note (Signed)
I reviewed pulmonary he notes. No pulmonary finding has shown the cause of his did see on exertion. He did have a modified stress test which he had multiple PVCs associated with shortness of breath. I will contact the pulmonary office to see what the results of his Holter monitor were. He is followup with cardiology already established

## 2013-10-07 NOTE — Progress Notes (Signed)
  Subjective:    Patient ID: Samuel Flores, male    DOB: [birth year], 72 y.o.   MRN: [id number]  HPI Patient here secondary to leg swelling for the past week. He has no history of CHF he does have a history of hypertension as well as alcohol abuse and pulmonary hypertension. He has been followed by pulmonary over the past couple months secondary to dyspnea on exertion. He's had pulmonary workup which is been benign however there has been concern for PVCs associated with his shortness of breath. He did have a 48-hour Holter monitor placed but he is never received the results of this. He has not followed up with cardiology which was recommended. He denies any change in his shortness of breath he denies any chest pain or URI symptoms. He does note that he is a heavy alcohol drinker.   Review of Systems  GEN- denies fatigue, fever, weight loss,weakness, recent illness HEENT- denies eye drainage, change in vision, nasal discharge, CVS- denies chest pain, palpitations RESP- + SOB with exertion, cough, wheeze Neuro- denies headache, dizziness, syncope, seizure activity      Objective:   Physical Exam GEN- NAD, alert and oriented x3 HEENT- PERRL, EOMI, non injected sclera, pink conjunctiva, MMM, oropharynx clear Neck- Supple, no JVD CVS- RRR, no murmur RESP-CTAB,  ABD-NABS,soft, liver edge not palpable,ND EXT- 1+ pitting edema to shins  Pulses- Radial 2+         Assessment & Plan:

## 2013-10-07 NOTE — Patient Instructions (Signed)
Take the lasix once a day for the next 5 days Continue benicar 10mg  Appointment with Dr. Jacinto Halim- Dec 8 at 12pm F/U as needed

## 2013-10-08 LAB — COMPREHENSIVE METABOLIC PANEL
AST: 63 U/L — ABNORMAL HIGH (ref 0–37)
Albumin: 4 g/dL (ref 3.5–5.2)
BUN: 10 mg/dL (ref 6–23)
Calcium: 8.5 mg/dL (ref 8.4–10.5)
Chloride: 105 mEq/L (ref 96–112)
Glucose, Bld: 98 mg/dL (ref 70–99)
Potassium: 3.3 mEq/L — ABNORMAL LOW (ref 3.5–5.3)
Sodium: 144 mEq/L (ref 135–145)
Total Protein: 6.6 g/dL (ref 6.0–8.3)

## 2013-10-08 MED ORDER — POTASSIUM CHLORIDE CRYS ER 20 MEQ PO TBCR: 20.0000 meq | EXTENDED_RELEASE_TABLET | Freq: Every day | ORAL | Status: DC

## 2013-10-08 NOTE — Addendum Note (Signed)
Addended by: Milinda Antis F on: 10/08/2013 01:52 PM   Modules accepted: Orders

## 2013-10-20 DIAGNOSIS — E7800 Pure hypercholesterolemia, unspecified: Secondary | ICD-10-CM | POA: Diagnosis not present

## 2013-10-20 DIAGNOSIS — I2510 Atherosclerotic heart disease of native coronary artery without angina pectoris: Secondary | ICD-10-CM | POA: Diagnosis not present

## 2013-10-20 DIAGNOSIS — R0602 Shortness of breath: Secondary | ICD-10-CM | POA: Diagnosis not present

## 2013-10-20 DIAGNOSIS — I209 Angina pectoris, unspecified: Secondary | ICD-10-CM | POA: Diagnosis not present

## 2013-11-10 ENCOUNTER — Encounter: Payer: Self-pay | Admitting: Family Medicine

## 2013-11-10 ENCOUNTER — Ambulatory Visit (INDEPENDENT_AMBULATORY_CARE_PROVIDER_SITE_OTHER): Payer: Medicare Other | Admitting: Family Medicine

## 2013-11-10 VITALS — BP 134/78 | HR 80 | Temp 97.5°F | Resp 16 | Ht 69.0 in | Wt 187.0 lb

## 2013-11-10 DIAGNOSIS — R609 Edema, unspecified: Secondary | ICD-10-CM

## 2013-11-10 DIAGNOSIS — I10 Essential (primary) hypertension: Secondary | ICD-10-CM | POA: Diagnosis not present

## 2013-11-10 MED ORDER — FUROSEMIDE 40 MG PO TABS: 40.0000 mg | ORAL_TABLET | Freq: Every day | ORAL | Status: DC

## 2013-11-10 MED ORDER — POTASSIUM CHLORIDE CRYS ER 20 MEQ PO TBCR: 20.0000 meq | EXTENDED_RELEASE_TABLET | Freq: Every day | ORAL | Status: DC

## 2013-11-10 NOTE — Patient Instructions (Addendum)
Stop the norvasc  Take 1 whole tablet of benicar  Start lasix - take 2 tablets ( 80mg  daily) with potassium in the morning  Check and see how much enalapril F/U Wed Morning for recheck- 8AM okay to [facility]

## 2013-11-11 NOTE — Assessment & Plan Note (Signed)
He will take 20 mg of Benicar until he runs out of this. He will also hold his amlodipine per above with a leg swelling. He will also continue his atenolol  The goal be to transition him back to his enalapril since there is no change in his breathing with the ACE inhibitor been discontinued. Also does not have prescription drug plan therefore cannot afford the Benicar

## 2013-11-11 NOTE — Progress Notes (Signed)
   Subjective:    Patient ID: TALYN DESSERT, male    DOB: 1940-12-21, 72 y.o.   MRN: [id number]  HPI Patient here to followup leg swelling. He was seen about a month ago at that time he was given 5 days of Lasix 40 mg and he was scheduled with his cardiologist. I reviewed the cardiology note which states that when he came to the visit his leg edema had resolved and that he no longer needed any diuretics. The patient states that he still had swelling and is not improved and he continues to gain weight. He was also started on IMDUR for his shortness of breath . His imaging was up to date per his cardiologist and did not need to be repeated. He does have followup within the next couple of weeks. Of note he is also concerned about the cost of the Benicar he was switched to this by pulmonary due to his shortness of breath there was no change from going from the ACE inhibitor to the ARB.    Review of Systems - per above  GEN- denies fatigue, fever, weight loss,weakness, recent illness HEENT- denies eye drainage, change in vision, nasal discharge, CVS- denies chest pain, palpitations RESP-+ SOB, denies cough, wheeze Neuro- denies headache, dizziness, syncope, seizure activity      Objective:   Physical Exam GEN- NAD, alert and oriented x3 , + weight gain HEENT-PERRL.EOMI,non icteric pink conjunctiva, MMM, oropharynx clear Neck- Supple, no JVD CVS- RRR, no murmur RESP-CTAB,  EXT- 1+ pitting edema to shins  Pulses- Radial 2+        Assessment & Plan:

## 2013-11-11 NOTE — Assessment & Plan Note (Signed)
Persistent edema with weight gain. I will increase his Lasix to 80 mg daily he will followup in 48 hours for recheck. He also has followup with his cardiologist  He's had echocardiogram I will also hold his amlodipine to see if this is contributing to his edema appear

## 2013-11-12 ENCOUNTER — Ambulatory Visit (INDEPENDENT_AMBULATORY_CARE_PROVIDER_SITE_OTHER): Payer: Medicare Other | Admitting: Family Medicine

## 2013-11-12 ENCOUNTER — Encounter: Payer: Self-pay | Admitting: Family Medicine

## 2013-11-12 VITALS — BP 108/72 | HR 78 | Temp 98.1°F | Resp 18 | Wt 181.0 lb

## 2013-11-12 DIAGNOSIS — R609 Edema, unspecified: Secondary | ICD-10-CM | POA: Diagnosis not present

## 2013-11-12 DIAGNOSIS — E7800 Pure hypercholesterolemia, unspecified: Secondary | ICD-10-CM | POA: Diagnosis not present

## 2013-11-12 LAB — BASIC METABOLIC PANEL
BUN: 9 mg/dL (ref 6–23)
CO2: 27 mEq/L (ref 19–32)
Glucose, Bld: 122 mg/dL — ABNORMAL HIGH (ref 70–99)
Potassium: 2.8 mEq/L — ABNORMAL LOW (ref 3.5–5.3)
Sodium: 143 mEq/L (ref 135–145)

## 2013-11-12 LAB — LIPID PANEL
LDL Cholesterol: 88 mg/dL (ref 0–99)
Total CHOL/HDL Ratio: 2.9 Ratio
VLDL: 19 mg/dL (ref 0–40)

## 2013-11-12 NOTE — Progress Notes (Signed)
   Subjective:    Patient ID: Samuel Flores, male    DOB: 11/29/1940, 72 y.o.   MRN: [id number]  HPI   patient here for 48 hour recheck on his leg swelling. He is actually lost 6 pounds since her last visit. He continues to have some swelling in his leg left greater than right. He did take 80 mg of Lasix yesterday however he did take 40 mg a day because he urinated all throughout the day and night and this is too uncomfortable for him. He has no new concerns today.   Review of Systems - per above   GEN- denies fatigue, fever, weight loss,weakness, recent illness CVS- denies chest pain, palpitations RESP- + SOB, cough, wheeze        Objective:   Physical Exam GEN- NAD, alert and oriented x3 , + weight gain CVS- RRR, no murmur RESP-CTAB,  EXT- 1+ pitting edema LLE improved, trace edema RLE Pulses- Radial 2+        Assessment & Plan:

## 2013-11-12 NOTE — Patient Instructions (Signed)
Continue Lasix 1 tablet daily with the potassium We will call with lab results Follow-up with the heart doctor Start enalapril when you run out of benicar F/U 6 weeks

## 2013-11-12 NOTE — Assessment & Plan Note (Signed)
He has lost 6 pounds and his edema has improved. I tried to reiterate this to him. He will continue Lasix at 40 mg once a day along with potassium. His metabolic panel will be checked today. For some reason he stopped responding to the Lasix we can increase to 60 mg daily. He does have followup with cardiology within the next 2 weeks.

## 2013-11-24 DIAGNOSIS — R0602 Shortness of breath: Secondary | ICD-10-CM | POA: Diagnosis not present

## 2013-11-24 DIAGNOSIS — I209 Angina pectoris, unspecified: Secondary | ICD-10-CM | POA: Diagnosis not present

## 2013-11-24 DIAGNOSIS — I2510 Atherosclerotic heart disease of native coronary artery without angina pectoris: Secondary | ICD-10-CM | POA: Diagnosis not present

## 2013-11-24 DIAGNOSIS — J438 Other emphysema: Secondary | ICD-10-CM | POA: Diagnosis not present

## 2013-12-04 ENCOUNTER — Other Ambulatory Visit: Payer: Self-pay | Admitting: Physician Assistant

## 2013-12-04 NOTE — Telephone Encounter (Signed)
Medication refilled per protocol. 

## 2013-12-11 ENCOUNTER — Other Ambulatory Visit: Payer: Self-pay | Admitting: Physician Assistant

## 2013-12-12 NOTE — Telephone Encounter (Signed)
Medication refilled per protocol. 

## 2013-12-15 DIAGNOSIS — R0602 Shortness of breath: Secondary | ICD-10-CM | POA: Diagnosis not present

## 2013-12-15 DIAGNOSIS — I5030 Unspecified diastolic (congestive) heart failure: Secondary | ICD-10-CM | POA: Diagnosis not present

## 2013-12-15 DIAGNOSIS — I2510 Atherosclerotic heart disease of native coronary artery without angina pectoris: Secondary | ICD-10-CM | POA: Diagnosis not present

## 2013-12-16 DIAGNOSIS — C44319 Basal cell carcinoma of skin of other parts of face: Secondary | ICD-10-CM | POA: Diagnosis not present

## 2013-12-24 ENCOUNTER — Ambulatory Visit (INDEPENDENT_AMBULATORY_CARE_PROVIDER_SITE_OTHER): Payer: Medicare Other | Admitting: Family Medicine

## 2013-12-24 ENCOUNTER — Encounter: Payer: Self-pay | Admitting: Family Medicine

## 2013-12-24 VITALS — BP 120/70 | HR 68 | Temp 97.5°F | Resp 16 | Wt 178.0 lb

## 2013-12-24 DIAGNOSIS — R0609 Other forms of dyspnea: Secondary | ICD-10-CM | POA: Diagnosis not present

## 2013-12-24 DIAGNOSIS — R008 Other abnormalities of heart beat: Secondary | ICD-10-CM

## 2013-12-24 DIAGNOSIS — R7301 Impaired fasting glucose: Secondary | ICD-10-CM

## 2013-12-24 DIAGNOSIS — R012 Other cardiac sounds: Secondary | ICD-10-CM | POA: Diagnosis not present

## 2013-12-24 DIAGNOSIS — R0989 Other specified symptoms and signs involving the circulatory and respiratory systems: Secondary | ICD-10-CM

## 2013-12-24 DIAGNOSIS — R0600 Dyspnea, unspecified: Secondary | ICD-10-CM

## 2013-12-24 DIAGNOSIS — E7800 Pure hypercholesterolemia, unspecified: Secondary | ICD-10-CM

## 2013-12-24 DIAGNOSIS — E876 Hypokalemia: Secondary | ICD-10-CM | POA: Diagnosis not present

## 2013-12-24 DIAGNOSIS — R609 Edema, unspecified: Secondary | ICD-10-CM

## 2013-12-24 LAB — BASIC METABOLIC PANEL
BUN: 8 mg/dL (ref 6–23)
CO2: 31 meq/L (ref 19–32)
CREATININE: 0.78 mg/dL (ref 0.50–1.35)
Calcium: 8.3 mg/dL — ABNORMAL LOW (ref 8.4–10.5)
Chloride: 102 mEq/L (ref 96–112)
GLUCOSE: 96 mg/dL (ref 70–99)
Potassium: 3 mEq/L — ABNORMAL LOW (ref 3.5–5.3)
Sodium: 143 mEq/L (ref 135–145)

## 2013-12-24 LAB — HEMOGLOBIN A1C
Hgb A1c MFr Bld: 5.4 % (ref <5.7)
Mean Plasma Glucose: 108 mg/dL (ref <117)

## 2013-12-24 NOTE — Progress Notes (Signed)
Patient ID: XAI FRERKING, male   DOB: [birth year], 73 y.o.   MRN: [id number]     Subjective:    Patient ID: RENAN DANESE, male    DOB: 26-Oct-1941, 73 y.o.   MRN: [id number]  Patient presents for No chief complaint on file.  Pt here to f/u leg swelling and SOB. Continues to have SOB with minimal exertion, has been seen by cardiology and recently given Ranexa but had no improvement in symptoms, He had 2 D echo done by cardiology last week he is awaiting results on. He has been taking 20mg  of lasix with 1/2 tablet of potassium most days. Recent labs reviewed at bedside,elevated fasting glucose noted    Review Of Systems:  GEN- denies fatigue, fever, weight loss,weakness, recent illness HEENT- denies eye drainage, change in vision, nasal discharge, CVS- denies chest pain, palpitations RESP-+ SOB, cough, wheeze MSK- denies joint pain, muscle aches, injury Neuro- denies headache, dizziness, syncope, seizure activity       Objective:    There were no vitals taken for this visit. GEN- NAD, alert and oriented x3 HEENT- PERRL, EOMI, non injected sclera, pink conjunctiva, MMM, oropharynx clear CVS- irregular rhythm, normal rate, no murmur RESP-CTAB EXT- No edema Pulses- Radial 2+  EKG- NSR, with PVC, [place], compared to old EKG unchanged, QT slightly prolonged       Assessment & Plan:      Problem List Items Addressed This Visit   Hypokalemia - Primary   Relevant Orders      Basic metabolic panel    Other Visit Diagnoses   Elevated fasting glucose        Relevant Orders       Hemoglobin A1c       Note: This dictation was prepared with Dragon dictation along with smaller phrase technology. Any transcriptional errors that result from this process are unintentional.

## 2013-12-24 NOTE — Assessment & Plan Note (Signed)
Continues to be an issue has been evaluated by pulmonary and is now being followed by cardiology he did have a recent echocardiogram which we are awaiting the results. This is probably related to poor conditioning as well as his cardiac status.

## 2013-12-24 NOTE — Assessment & Plan Note (Signed)
Noted on EKG, has cardiac followup will fax his holter monitor results and EKG from today

## 2013-12-24 NOTE — Patient Instructions (Signed)
Continue lasix  We will check on the heart monitor again We will call with lab results F/U 3 months

## 2013-12-24 NOTE — Assessment & Plan Note (Signed)
Edema is much improved we'll continue Lasix he understands when to increase dose

## 2013-12-24 NOTE — Assessment & Plan Note (Signed)
Reviewed lipid panel everything looks great continue Lipitor 20 mg

## 2014-01-02 ENCOUNTER — Other Ambulatory Visit: Payer: Self-pay | Admitting: Family Medicine

## 2014-01-02 NOTE — Telephone Encounter (Signed)
Refill appropriate and filled per protocol. 

## 2014-01-13 DIAGNOSIS — I2510 Atherosclerotic heart disease of native coronary artery without angina pectoris: Secondary | ICD-10-CM | POA: Diagnosis not present

## 2014-01-13 DIAGNOSIS — R0602 Shortness of breath: Secondary | ICD-10-CM | POA: Diagnosis not present

## 2014-01-13 DIAGNOSIS — I2789 Other specified pulmonary heart diseases: Secondary | ICD-10-CM | POA: Diagnosis not present

## 2014-01-13 DIAGNOSIS — I426 Alcoholic cardiomyopathy: Secondary | ICD-10-CM | POA: Diagnosis not present

## 2014-01-19 ENCOUNTER — Encounter: Payer: Self-pay | Admitting: Family Medicine

## 2014-01-29 DIAGNOSIS — D485 Neoplasm of uncertain behavior of skin: Secondary | ICD-10-CM | POA: Diagnosis not present

## 2014-01-29 DIAGNOSIS — C44319 Basal cell carcinoma of skin of other parts of face: Secondary | ICD-10-CM | POA: Diagnosis not present

## 2014-01-29 DIAGNOSIS — C44390 Other specified malignant neoplasm of skin of unspecified parts of face: Secondary | ICD-10-CM | POA: Diagnosis not present

## 2014-01-31 ENCOUNTER — Other Ambulatory Visit: Payer: Self-pay | Admitting: Family Medicine

## 2014-02-02 NOTE — Telephone Encounter (Signed)
Medication refilled per protocol. 

## 2014-02-03 ENCOUNTER — Other Ambulatory Visit: Payer: Self-pay | Admitting: Family Medicine

## 2014-02-03 NOTE — Telephone Encounter (Signed)
Refill appropriate and filled per protocol. 

## 2014-02-26 DIAGNOSIS — S0180XA Unspecified open wound of other part of head, initial encounter: Secondary | ICD-10-CM | POA: Diagnosis not present

## 2014-03-24 ENCOUNTER — Ambulatory Visit (INDEPENDENT_AMBULATORY_CARE_PROVIDER_SITE_OTHER): Payer: Medicare Other | Admitting: Family Medicine

## 2014-03-24 ENCOUNTER — Encounter: Payer: Self-pay | Admitting: Family Medicine

## 2014-03-24 VITALS — BP 148/90 | HR 68 | Temp 98.0°F | Resp 16 | Ht 68.5 in | Wt 184.0 lb

## 2014-03-24 DIAGNOSIS — E7800 Pure hypercholesterolemia, unspecified: Secondary | ICD-10-CM

## 2014-03-24 DIAGNOSIS — I5030 Unspecified diastolic (congestive) heart failure: Secondary | ICD-10-CM

## 2014-03-24 DIAGNOSIS — F1010 Alcohol abuse, uncomplicated: Secondary | ICD-10-CM

## 2014-03-24 DIAGNOSIS — I509 Heart failure, unspecified: Secondary | ICD-10-CM | POA: Diagnosis not present

## 2014-03-24 DIAGNOSIS — I10 Essential (primary) hypertension: Secondary | ICD-10-CM

## 2014-03-24 LAB — COMPREHENSIVE METABOLIC PANEL
ALT: 82 U/L — AB (ref 0–53)
AST: 102 U/L — ABNORMAL HIGH (ref 0–37)
Albumin: 3.7 g/dL (ref 3.5–5.2)
Alkaline Phosphatase: 102 U/L (ref 39–117)
BUN: 6 mg/dL (ref 6–23)
CO2: 24 meq/L (ref 19–32)
CREATININE: 0.67 mg/dL (ref 0.50–1.35)
Calcium: 8.6 mg/dL (ref 8.4–10.5)
Chloride: 103 mEq/L (ref 96–112)
Glucose, Bld: 96 mg/dL (ref 70–99)
Potassium: 3.3 mEq/L — ABNORMAL LOW (ref 3.5–5.3)
SODIUM: 141 meq/L (ref 135–145)
TOTAL PROTEIN: 6.4 g/dL (ref 6.0–8.3)
Total Bilirubin: 1.6 mg/dL — ABNORMAL HIGH (ref 0.2–1.2)

## 2014-03-24 LAB — CBC WITH DIFFERENTIAL/PLATELET
Basophils Absolute: 0.1 10*3/uL (ref 0.0–0.1)
Basophils Relative: 1 % (ref 0–1)
EOS ABS: 0.2 10*3/uL (ref 0.0–0.7)
EOS PCT: 3 % (ref 0–5)
HCT: 47.7 % (ref 39.0–52.0)
HEMOGLOBIN: 17.2 g/dL — AB (ref 13.0–17.0)
LYMPHS PCT: 19 % (ref 12–46)
Lymphs Abs: 1.2 10*3/uL (ref 0.7–4.0)
MCH: 35.1 pg — AB (ref 26.0–34.0)
MCHC: 36.1 g/dL — ABNORMAL HIGH (ref 30.0–36.0)
MCV: 97.3 fL (ref 78.0–100.0)
MONOS PCT: 9 % (ref 3–12)
Monocytes Absolute: 0.6 10*3/uL (ref 0.1–1.0)
Neutro Abs: 4.4 10*3/uL (ref 1.7–7.7)
Neutrophils Relative %: 68 % (ref 43–77)
PLATELETS: 169 10*3/uL (ref 150–400)
RBC: 4.9 MIL/uL (ref 4.22–5.81)
RDW: 13.5 % (ref 11.5–15.5)
WBC: 6.4 10*3/uL (ref 4.0–10.5)

## 2014-03-24 LAB — LIPID PANEL
CHOLESTEROL: 141 mg/dL (ref 0–200)
HDL: 45 mg/dL (ref >39)
LDL Cholesterol: 72 mg/dL (ref 0–99)
Total CHOL/HDL Ratio: 3.1 Ratio
Triglycerides: 122 mg/dL (ref <150)
VLDL: 24 mg/dL (ref 0–40)

## 2014-03-24 NOTE — Patient Instructions (Signed)
Continue current medications  Take your lasix every day to prevent fluid build-up F/u 4 months

## 2014-03-24 NOTE — Progress Notes (Signed)
Patient ID: Samuel Flores, male   DOB: 1941-08-09, 73 y.o.   MRN: [id number]   Subjective:    Patient ID: Samuel Flores, male    DOB: 19-Sep-1941, 72 y.o.   MRN: [id number]  Patient presents for 3 month F/U  patient to follow chronic medical problems. He has no specific concerns today. He is due for some fasting labs. He does have a diagnosis of diastolic dysfunction CHF he's not been taking his diuretic a regular basis and is actually up 6 pounds today. States he can feel it in his arms and hands when he is swelling. He's not had any chest pain or shortness of breath is at baseline. He was seen recently by his cardiologist Dr. [name]  He does continue to drink a regular basis he mixes bourbon with sweet tea he states that he drinks about 8 ounces of bourbon every night. States that because his breathing is so bad he is very poor and there is nothing he can do therefore he looks forward to drinking his alcohol.    Review Of Systems:  GEN- denies fatigue, fever, weight loss,weakness, recent illness HEENT- denies eye drainage, change in vision, nasal discharge, CVS- denies chest pain, palpitations RESP- + SOB, denies cough, wheeze ABD- denies N/V, change in stools, abd pain GU- denies dysuria, hematuria, dribbling, incontinence MSK- denies joint pain, muscle aches, injury Neuro- denies headache, dizziness, syncope, seizure activity       Objective:    BP 148/90  Pulse 68  Temp(Src) 98 F (36.7 C) (Oral)  Resp 16  Ht 5' 8.5" (1.74 m)  Wt 184 lb (83.462 kg)  BMI 27.57 kg/m2 GEN- NAD, alert and oriented x3 HEENT- PERRL, EOMI, non injected sclera, pink conjunctiva, MMM, oropharynx clear Neck- Supple, no JVD CVS- RRR, no murmur RESP-CTAB ABD-NABS,soft,NT,ND EXT- trace pedal  edema Pulses- Radial 2+        Assessment & Plan:      Problem List Items Addressed This Visit   Hypercholesteremia - Primary   Relevant Medications      enalapril (VASOTEC) 20 MG tablet   Other  Relevant Orders      Lipid panel (Completed)   HBP (high blood pressure)   Relevant Medications      enalapril (VASOTEC) 20 MG tablet   CHF with left ventricular diastolic dysfunction, NYHA class 2   Relevant Medications      enalapril (VASOTEC) 20 MG tablet   Other Relevant Orders      CBC with Differential (Completed)      Comprehensive metabolic panel (Completed)   Alcohol abuse, daily use      Note: This dictation was prepared with Dragon dictation along with smaller phrase technology. Any transcriptional errors that result from this process are unintentional.    He declines pneumonia vaccine

## 2014-03-24 NOTE — Assessment & Plan Note (Signed)
We discussed the importance of him cutting back on alcohol as this can also lead to a cardiomyopathy. He also does not help with his fluid retention. Have been decreased to 6 ounces and then try to decrease to 4 ounces a day hopefully get him to minimal alcohol daily but he does not really seem interested in quitting altogether

## 2014-03-24 NOTE — Assessment & Plan Note (Signed)
Per above his labs will be checked he will restart his Lasix discussed importance of taking this on a regular basis

## 2014-03-24 NOTE — Assessment & Plan Note (Signed)
His blood pressure is elevated is a little bit today however I think that this is due to his mild fluid retention. His previous blood pressures have looked very good.

## 2014-03-27 ENCOUNTER — Telehealth: Payer: Self-pay | Admitting: Family Medicine

## 2014-03-27 DIAGNOSIS — R945 Abnormal results of liver function studies: Principal | ICD-10-CM

## 2014-03-27 DIAGNOSIS — R7989 Other specified abnormal findings of blood chemistry: Secondary | ICD-10-CM

## 2014-03-27 NOTE — Telephone Encounter (Signed)
noted 

## 2014-03-27 NOTE — Telephone Encounter (Signed)
Message copied by Olena Mater on Fri Mar 27, 2014 10:47 AM ------      Message from: Vic Blackbird F      Created: Fri Mar 27, 2014  8:39 AM       Call pt--   He needs to come back and have repeat LFT in 4 weeks        His liver labs are elevated this is likley due to his Alcohol intake, but I also want him to avoid tylenol, if his liver labs do not improve he will need [place] of liver done       His potassium is also low, take the entire potassium pill each day      Cholesterol is normal              Fax copy of labs to Dr. [name]- Cardiology ------

## 2014-03-27 NOTE — Telephone Encounter (Signed)
Spoke to patient about lab results.  Advised to avoid alcohol and anything with Acetaminophen (tylenol) in it.  He said he does not use anything with Tylenol in it.  Also told to take potassium everyday.  He says he takes when he takes.  His Potasium has been low for 30 years and he is not concerned about it.  Lab appt for 4 weeks to repeat LFT's, order placed

## 2014-04-23 ENCOUNTER — Other Ambulatory Visit: Payer: Self-pay | Admitting: Cardiology

## 2014-04-23 DIAGNOSIS — K7460 Unspecified cirrhosis of liver: Secondary | ICD-10-CM

## 2014-04-23 DIAGNOSIS — I2510 Atherosclerotic heart disease of native coronary artery without angina pectoris: Secondary | ICD-10-CM | POA: Diagnosis not present

## 2014-04-23 DIAGNOSIS — I2789 Other specified pulmonary heart diseases: Secondary | ICD-10-CM | POA: Diagnosis not present

## 2014-04-23 DIAGNOSIS — I426 Alcoholic cardiomyopathy: Secondary | ICD-10-CM | POA: Diagnosis not present

## 2014-04-23 DIAGNOSIS — R609 Edema, unspecified: Secondary | ICD-10-CM | POA: Diagnosis not present

## 2014-04-24 ENCOUNTER — Other Ambulatory Visit: Payer: Self-pay

## 2014-04-29 ENCOUNTER — Ambulatory Visit
Admission: RE | Admit: 2014-04-29 | Discharge: 2014-04-29 | Disposition: A | Discharge status: Home / Self Care | Payer: Medicare Other | Source: Ambulatory Visit | Attending: Cardiology | Admitting: Cardiology

## 2014-04-29 ENCOUNTER — Other Ambulatory Visit: Payer: Self-pay | Admitting: Cardiology

## 2014-04-29 DIAGNOSIS — K7460 Unspecified cirrhosis of liver: Secondary | ICD-10-CM

## 2014-06-03 ENCOUNTER — Other Ambulatory Visit: Payer: Medicare Other

## 2014-06-03 DIAGNOSIS — R945 Abnormal results of liver function studies: Principal | ICD-10-CM

## 2014-06-03 DIAGNOSIS — R7989 Other specified abnormal findings of blood chemistry: Secondary | ICD-10-CM | POA: Diagnosis not present

## 2014-06-03 DIAGNOSIS — I426 Alcoholic cardiomyopathy: Secondary | ICD-10-CM | POA: Diagnosis not present

## 2014-06-03 DIAGNOSIS — K7689 Other specified diseases of liver: Secondary | ICD-10-CM | POA: Diagnosis not present

## 2014-06-03 DIAGNOSIS — R609 Edema, unspecified: Secondary | ICD-10-CM | POA: Diagnosis not present

## 2014-06-03 LAB — HEPATIC FUNCTION PANEL
ALT: 50 U/L (ref 0–53)
AST: 59 U/L — ABNORMAL HIGH (ref 0–37)
Albumin: 3.7 g/dL (ref 3.5–5.2)
Alkaline Phosphatase: 92 U/L (ref 39–117)
BILIRUBIN TOTAL: 2.1 mg/dL — AB (ref 0.2–1.2)
Bilirubin, Direct: 0.6 mg/dL — ABNORMAL HIGH (ref 0.0–0.3)
Indirect Bilirubin: 1.5 mg/dL — ABNORMAL HIGH (ref 0.2–1.2)
Total Protein: 6.2 g/dL (ref 6.0–8.3)

## 2014-06-09 ENCOUNTER — Other Ambulatory Visit: Payer: Self-pay | Admitting: *Deleted

## 2014-06-09 MED ORDER — FUROSEMIDE 40 MG PO TABS: 40.0000 mg | ORAL_TABLET | Freq: Every day | ORAL | Status: DC | PRN

## 2014-06-09 NOTE — Telephone Encounter (Signed)
Refill appropriate and filled per protocol. 

## 2014-06-19 ENCOUNTER — Other Ambulatory Visit: Payer: Medicare Other

## 2014-06-19 DIAGNOSIS — I10 Essential (primary) hypertension: Secondary | ICD-10-CM

## 2014-06-19 DIAGNOSIS — I272 Pulmonary hypertension, unspecified: Secondary | ICD-10-CM

## 2014-06-19 DIAGNOSIS — E7800 Pure hypercholesterolemia, unspecified: Secondary | ICD-10-CM

## 2014-06-19 DIAGNOSIS — Z79899 Other long term (current) drug therapy: Secondary | ICD-10-CM

## 2014-06-19 DIAGNOSIS — I2789 Other specified pulmonary heart diseases: Secondary | ICD-10-CM | POA: Diagnosis not present

## 2014-06-19 LAB — CBC WITH DIFFERENTIAL/PLATELET
Basophils Absolute: 0.1 10*3/uL (ref 0.0–0.1)
Basophils Relative: 1 % (ref 0–1)
EOS ABS: 0.3 10*3/uL (ref 0.0–0.7)
EOS PCT: 3 % (ref 0–5)
HCT: 51 % (ref 39.0–52.0)
Hemoglobin: 18 g/dL — ABNORMAL HIGH (ref 13.0–17.0)
Lymphocytes Relative: 25 % (ref 12–46)
Lymphs Abs: 2.5 10*3/uL (ref 0.7–4.0)
MCH: 35 pg — AB (ref 26.0–34.0)
MCHC: 35.3 g/dL (ref 30.0–36.0)
MCV: 99 fL (ref 78.0–100.0)
Monocytes Absolute: 1.1 10*3/uL — ABNORMAL HIGH (ref 0.1–1.0)
Monocytes Relative: 11 % (ref 3–12)
Neutro Abs: 5.9 10*3/uL (ref 1.7–7.7)
Neutrophils Relative %: 60 % (ref 43–77)
Platelets: 223 10*3/uL (ref 150–400)
RBC: 5.15 MIL/uL (ref 4.22–5.81)
RDW: 13.5 % (ref 11.5–15.5)
WBC: 9.8 10*3/uL (ref 4.0–10.5)

## 2014-06-20 LAB — COMPLETE METABOLIC PANEL WITH GFR
ALT: 70 U/L — AB (ref 0–53)
AST: 82 U/L — ABNORMAL HIGH (ref 0–37)
Albumin: 4.1 g/dL (ref 3.5–5.2)
Alkaline Phosphatase: 85 U/L (ref 39–117)
BILIRUBIN TOTAL: 1.5 mg/dL — AB (ref 0.2–1.2)
BUN: 23 mg/dL (ref 6–23)
CO2: 25 meq/L (ref 19–32)
CREATININE: 1.1 mg/dL (ref 0.50–1.35)
Calcium: 8.9 mg/dL (ref 8.4–10.5)
Chloride: 99 mEq/L (ref 96–112)
GFR, Est African American: 76 mL/min
GFR, Est Non African American: 65 mL/min
Glucose, Bld: 100 mg/dL — ABNORMAL HIGH (ref 70–99)
Potassium: 4.5 mEq/L (ref 3.5–5.3)
Sodium: 139 mEq/L (ref 135–145)
Total Protein: 6.5 g/dL (ref 6.0–8.3)

## 2014-06-20 LAB — LIPID PANEL
CHOL/HDL RATIO: 3.3 ratio
CHOLESTEROL: 157 mg/dL (ref 0–200)
HDL: 48 mg/dL (ref >39)
LDL Cholesterol: 77 mg/dL (ref 0–99)
Triglycerides: 161 mg/dL — ABNORMAL HIGH (ref <150)
VLDL: 32 mg/dL (ref 0–40)

## 2014-07-02 ENCOUNTER — Ambulatory Visit: Payer: Medicare Other | Admitting: *Deleted

## 2014-07-02 DIAGNOSIS — I10 Essential (primary) hypertension: Secondary | ICD-10-CM

## 2014-07-02 NOTE — Progress Notes (Signed)
Patient ID: Samuel Flores, male   DOB: [birth year], 73 y.o.   MRN: [id number] Patient seen in office to have BP checked.   States that he has been having high BP's noted at cardiology. States that BP has been running 180/100. Reports that he has been placed on (2) medications from cardiologist and he has been taking meds x2 weeks.   Reports that when he is getting up or changing position quickly, he does experience some dizziness of late.   Orthostatic BP obtained: 130/64- lying 130/64- sitting 128/60- standing.

## 2014-07-15 DIAGNOSIS — R1084 Generalized abdominal pain: Secondary | ICD-10-CM | POA: Diagnosis not present

## 2014-07-15 DIAGNOSIS — I2510 Atherosclerotic heart disease of native coronary artery without angina pectoris: Secondary | ICD-10-CM | POA: Diagnosis not present

## 2014-07-15 DIAGNOSIS — R609 Edema, unspecified: Secondary | ICD-10-CM | POA: Diagnosis not present

## 2014-07-15 DIAGNOSIS — K7689 Other specified diseases of liver: Secondary | ICD-10-CM | POA: Diagnosis not present

## 2014-07-27 ENCOUNTER — Encounter: Payer: Self-pay | Admitting: Family Medicine

## 2014-07-27 ENCOUNTER — Ambulatory Visit (INDEPENDENT_AMBULATORY_CARE_PROVIDER_SITE_OTHER): Payer: Medicare Other | Admitting: Family Medicine

## 2014-07-27 VITALS — BP 128/62 | HR 68 | Temp 98.4°F | Resp 14 | Ht 69.0 in | Wt 168.0 lb

## 2014-07-27 DIAGNOSIS — R0989 Other specified symptoms and signs involving the circulatory and respiratory systems: Secondary | ICD-10-CM

## 2014-07-27 DIAGNOSIS — F1010 Alcohol abuse, uncomplicated: Secondary | ICD-10-CM

## 2014-07-27 DIAGNOSIS — R945 Abnormal results of liver function studies: Secondary | ICD-10-CM

## 2014-07-27 DIAGNOSIS — I10 Essential (primary) hypertension: Secondary | ICD-10-CM

## 2014-07-27 DIAGNOSIS — R0600 Dyspnea, unspecified: Secondary | ICD-10-CM

## 2014-07-27 DIAGNOSIS — I5030 Unspecified diastolic (congestive) heart failure: Secondary | ICD-10-CM

## 2014-07-27 DIAGNOSIS — I509 Heart failure, unspecified: Secondary | ICD-10-CM

## 2014-07-27 DIAGNOSIS — R7989 Other specified abnormal findings of blood chemistry: Secondary | ICD-10-CM

## 2014-07-27 DIAGNOSIS — R0609 Other forms of dyspnea: Secondary | ICD-10-CM

## 2014-07-27 MED ORDER — ATENOLOL 50 MG PO TABS: ORAL_TABLET | ORAL | Status: DC

## 2014-07-27 MED ORDER — TRIAMTERENE-HCTZ 37.5-25 MG PO TABS: 0.5000 | ORAL_TABLET | Freq: Every day | ORAL | Status: DC

## 2014-07-27 MED ORDER — SPIRONOLACTONE 25 MG PO TABS: 25.0000 mg | ORAL_TABLET | Freq: Every day | ORAL | Status: DC

## 2014-07-27 MED ORDER — ENALAPRIL MALEATE 20 MG PO TABS: ORAL_TABLET | ORAL | Status: DC

## 2014-07-27 MED ORDER — ATORVASTATIN CALCIUM 20 MG PO TABS: 10.0000 mg | ORAL_TABLET | Freq: Every day | ORAL | Status: DC

## 2014-07-27 NOTE — Progress Notes (Signed)
Patient ID: Samuel Flores, male   DOB: 11-Mar-1941, 73 y.o.   MRN: [id number]   Subjective:    Patient ID: Samuel Flores, male    DOB: January 07, 1941, 73 y.o.   MRN: [id number]  Patient presents for 4 month F/U  Patient here to follow chronic medical problems. He was seen by his cardiologist since her last visit he had some change in his medications due to shortness of breath thought to be secondary to his heart failure and deconditioning he was seen by pulmonology without any significant COPD or emphysema. He continues to have shortness of breath. He began having some dizzy spells and noticed that his blood pressure was dropping low as cardiologist has cut back on his blood pressure medication since then. He also has these he's had elevated LFTs as well as an ultrasound concerning for cirrhosis however he has declined seen gastroenterology multiple times. He states that he does get up and crawling sensation and a couple weeks he had diarrhea once a week that resolved on its own. He states that his diet is not very good he does not think that he takes in a lot of sodium. He's not had any significant swelling of the abdomen or the lower sternum these.  Declines flu shot   Review Of Systems:  GEN- denies fatigue, fever, weight loss,weakness, recent illness HEENT- denies eye drainage, change in vision, nasal discharge, CVS- denies chest pain, palpitations RESP- + SOB, cough, wheeze ABD- denies N/V, change in stools, abd pain GU- denies dysuria, hematuria, dribbling, incontinence MSK- denies joint pain, muscle aches, injury Neuro- denies headache, dizziness, syncope, seizure activity       Objective:    BP 128/62  Pulse 68  Temp(Src) 98.4 F (36.9 C) (Oral)  Resp 14  Ht 5\' 9"  (1.753 m)  Wt 168 lb (76.204 kg)  BMI 24.80 kg/m2 GEN- NAD, alert and oriented x3 HEENT- PERRL, EOMI, non injected sclera, pink conjunctiva, MMM, oropharynx clear Neck- Supple,  CVS- RRR, no  murmur RESP-CTAB ABD-NABS,soft,NT,ND EXT- No edema Pulses- Radial 2+        Assessment & Plan:      Problem List Items Addressed This Visit   HBP (high blood pressure)   CHF with left ventricular diastolic dysfunction, NYHA class 2 - Primary   Alcohol abuse, daily use      Note: This dictation was prepared with Dragon dictation along with smaller phrase technology. Any transcriptional errors that result from this process are unintentional.

## 2014-07-27 NOTE — Patient Instructions (Signed)
Continue current medications Drink ensure twice a day, eat small meals , with more protein  Call if you start swelling or weight changes  F/U 4 months

## 2014-07-27 NOTE — Assessment & Plan Note (Signed)
Blood pressure well controlled on current medication

## 2014-07-27 NOTE — Assessment & Plan Note (Signed)
Unchanged, 2/2 CFH and deconditioning, encouraged increasing daily activity

## 2014-07-27 NOTE — Assessment & Plan Note (Signed)
Even though he has some symptoms with exertion he is at his baseline recently seen by his cardiologist with medication changes. Recent labs reviewed

## 2014-07-27 NOTE — Addendum Note (Signed)
Addended by: Sheral Flow on: 07/27/2014 08:45 AM   Modules accepted: Orders

## 2014-07-27 NOTE — Assessment & Plan Note (Signed)
He continues to cut back he is now on 3 ounces of bourbon a day

## 2014-10-22 ENCOUNTER — Encounter (HOSPITAL_COMMUNITY): Payer: Self-pay | Admitting: Cardiology

## 2014-11-27 ENCOUNTER — Encounter: Payer: Self-pay | Admitting: Family Medicine

## 2014-11-27 ENCOUNTER — Ambulatory Visit (INDEPENDENT_AMBULATORY_CARE_PROVIDER_SITE_OTHER): Payer: Medicare Other | Admitting: Family Medicine

## 2014-11-27 VITALS — BP 110/58 | HR 62 | Temp 97.9°F | Resp 14 | Ht 69.0 in | Wt 173.0 lb

## 2014-11-27 DIAGNOSIS — I272 Pulmonary hypertension, unspecified: Secondary | ICD-10-CM

## 2014-11-27 DIAGNOSIS — K529 Noninfective gastroenteritis and colitis, unspecified: Secondary | ICD-10-CM

## 2014-11-27 DIAGNOSIS — E7800 Pure hypercholesterolemia, unspecified: Secondary | ICD-10-CM

## 2014-11-27 DIAGNOSIS — R945 Abnormal results of liver function studies: Secondary | ICD-10-CM

## 2014-11-27 DIAGNOSIS — F1010 Alcohol abuse, uncomplicated: Secondary | ICD-10-CM

## 2014-11-27 DIAGNOSIS — I270 Primary pulmonary hypertension: Secondary | ICD-10-CM

## 2014-11-27 DIAGNOSIS — C801 Malignant (primary) neoplasm, unspecified: Secondary | ICD-10-CM

## 2014-11-27 DIAGNOSIS — I5030 Unspecified diastolic (congestive) heart failure: Secondary | ICD-10-CM

## 2014-11-27 DIAGNOSIS — R7989 Other specified abnormal findings of blood chemistry: Secondary | ICD-10-CM

## 2014-11-27 LAB — COMPREHENSIVE METABOLIC PANEL
ALBUMIN: 3.9 g/dL (ref 3.5–5.2)
ALT: 49 U/L (ref 0–53)
AST: 50 U/L — AB (ref 0–37)
Alkaline Phosphatase: 94 U/L (ref 39–117)
BUN: 35 mg/dL — ABNORMAL HIGH (ref 6–23)
CHLORIDE: 106 meq/L (ref 96–112)
CO2: 16 mEq/L — ABNORMAL LOW (ref 19–32)
CREATININE: 1.51 mg/dL — AB (ref 0.50–1.35)
Calcium: 9.4 mg/dL (ref 8.4–10.5)
GLUCOSE: 88 mg/dL (ref 70–99)
Potassium: 6 mEq/L — ABNORMAL HIGH (ref 3.5–5.3)
Sodium: 135 mEq/L (ref 135–145)
Total Bilirubin: 0.5 mg/dL (ref 0.2–1.2)
Total Protein: 6.7 g/dL (ref 6.0–8.3)

## 2014-11-27 LAB — CBC WITH DIFFERENTIAL/PLATELET
Basophils Absolute: 0.1 10*3/uL (ref 0.0–0.1)
Basophils Relative: 1 % (ref 0–1)
EOS ABS: 0.3 10*3/uL (ref 0.0–0.7)
EOS PCT: 3 % (ref 0–5)
HCT: 41.2 % (ref 39.0–52.0)
Hemoglobin: 13.7 g/dL (ref 13.0–17.0)
LYMPHS PCT: 18 % (ref 12–46)
Lymphs Abs: 1.6 10*3/uL (ref 0.7–4.0)
MCH: 33.6 pg (ref 26.0–34.0)
MCHC: 33.3 g/dL (ref 30.0–36.0)
MCV: 101 fL — AB (ref 78.0–100.0)
MONO ABS: 0.6 10*3/uL (ref 0.1–1.0)
MPV: 9.4 fL (ref 8.6–12.4)
Monocytes Relative: 7 % (ref 3–12)
NEUTROS ABS: 6.5 10*3/uL (ref 1.7–7.7)
Neutrophils Relative %: 71 % (ref 43–77)
Platelets: 220 10*3/uL (ref 150–400)
RBC: 4.08 MIL/uL — ABNORMAL LOW (ref 4.22–5.81)
RDW: 12.1 % (ref 11.5–15.5)
WBC: 9.1 10*3/uL (ref 4.0–10.5)

## 2014-11-27 LAB — LIPID PANEL
CHOL/HDL RATIO: 3.8 ratio
Cholesterol: 157 mg/dL (ref 0–200)
HDL: 41 mg/dL (ref >39)
LDL Cholesterol: 66 mg/dL (ref 0–99)
Triglycerides: 252 mg/dL — ABNORMAL HIGH (ref <150)
VLDL: 50 mg/dL — ABNORMAL HIGH (ref 0–40)

## 2014-11-27 MED ORDER — DIPHENOXYLATE-ATROPINE 2.5-0.025 MG PO TABS: 1.0000 | ORAL_TABLET | Freq: Two times a day (BID) | ORAL | Status: DC | PRN

## 2014-11-27 NOTE — Assessment & Plan Note (Signed)
likley MTF, he want symptomatic treatment only and no real workup. I will give him Lomotil to use a couple times a week

## 2014-11-27 NOTE — Progress Notes (Signed)
Patient ID: Samuel Flores, male   DOB: [birth year], 74 y.o.   MRN: [id number]   Subjective:    Patient ID: Samuel Flores, male    DOB: 1941-07-02, 74 y.o.   MRN: [id number]  Patient presents for 4 month F/U  patient here to follow-up medications. His only complaint today is chronic diarrhea which she's had for at least 6 months. He states that he would get loose stools at least 2-3 times a week especially in the mornings he will not one Aleve the house. He's tried over-the-counter Imodium with no improvement. He has declined seeing gastroenterology multiple times even with his elevated liver function which is most likely due to cirrhosis of the liver from his drinking. He has a very poor diet tells me that he would just eat mayonnaise sandwiches he continues to drink a significant amount at night.    Review Of Systems:  GEN- denies fatigue, fever, weight loss,weakness, recent illness HEENT- denies eye drainage, change in vision, nasal discharge, CVS- denies chest pain, palpitations RESP- denies SOB, cough, wheeze ABD- denies N/V,= change in stools, +abd pain GU- denies dysuria, hematuria, dribbling, incontinence MSK- denies joint pain, muscle aches, injury Neuro- denies headache, dizziness, syncope, seizure activity       Objective:    BP 110/58 mmHg  Pulse 62  Temp(Src) 97.9 F (36.6 C) (Oral)  Resp 14  Ht 5\' 9"  (1.753 m)  Wt 173 lb (78.472 kg)  BMI 25.54 kg/m2 GEN- NAD, alert and oriented x3 HEENT- PERRL, EOMI, non injected sclera, pink conjunctiva, MMM, oropharynx clear CVS- RRR, no murmur RESP-CTAB ABD-NABS,soft,NT,ND EXT- No edema Pulses- Radial 2+        Assessment & Plan:      Problem List Items Addressed This Visit      Unprioritized   Hypercholesteremia - Primary   Relevant Orders   Comprehensive metabolic panel (Completed)   Lipid panel (Completed)   Elevated LFTs   Chronic diarrhea   Relevant Orders   CBC with Differential (Completed)   Cancer    Alcohol abuse, daily use      Note: This dictation was prepared with Dragon dictation along with smaller phrase technology. Any transcriptional errors that result from this process are unintentional.

## 2014-11-27 NOTE — Assessment & Plan Note (Signed)
He has no desire to stop drinking

## 2014-11-27 NOTE — Assessment & Plan Note (Signed)
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

## 2014-11-27 NOTE — Assessment & Plan Note (Signed)
Currently compensated and he is at his baseline regarding his chronic shortness of breath he is still followed by cardiology

## 2014-11-27 NOTE — Patient Instructions (Signed)
Take 1/2 tablet of the enalapril Try the lomotil for diarrhea We will call with bloodwork F/U 4 months

## 2014-12-01 ENCOUNTER — Other Ambulatory Visit: Payer: Self-pay | Admitting: *Deleted

## 2014-12-01 ENCOUNTER — Telehealth: Payer: Self-pay | Admitting: *Deleted

## 2014-12-01 DIAGNOSIS — E875 Hyperkalemia: Secondary | ICD-10-CM

## 2014-12-01 NOTE — Telephone Encounter (Signed)
Call placed to patient with results of recent labs.   Patient refused Kayexalate for hyperkalemia. On Call MD made aware and new orders obtained.   D/C Aldactone and Vasotec. Return to [facility] in (1) week to have labs redrawn and check BP.   MD to be made aware.

## 2014-12-07 NOTE — Telephone Encounter (Signed)
Please remind pt to come in to have labs redrawn he needs to restart his medications and I need his potassium before we can re-dose his meds

## 2014-12-07 NOTE — Telephone Encounter (Signed)
Call placed to patient.   States that he will be in office on Friday.

## 2014-12-11 ENCOUNTER — Other Ambulatory Visit: Payer: Medicare Other

## 2014-12-11 DIAGNOSIS — E875 Hyperkalemia: Secondary | ICD-10-CM

## 2014-12-11 LAB — BASIC METABOLIC PANEL
BUN: 23 mg/dL (ref 6–23)
CALCIUM: 8.9 mg/dL (ref 8.4–10.5)
CO2: 21 meq/L (ref 19–32)
Chloride: 106 mEq/L (ref 96–112)
Creat: 1.44 mg/dL — ABNORMAL HIGH (ref 0.50–1.35)
Glucose, Bld: 105 mg/dL — ABNORMAL HIGH (ref 70–99)
POTASSIUM: 4.6 meq/L (ref 3.5–5.3)
SODIUM: 139 meq/L (ref 135–145)

## 2015-01-21 DIAGNOSIS — I272 Other secondary pulmonary hypertension: Secondary | ICD-10-CM | POA: Diagnosis not present

## 2015-01-21 DIAGNOSIS — I426 Alcoholic cardiomyopathy: Secondary | ICD-10-CM | POA: Diagnosis not present

## 2015-01-21 DIAGNOSIS — I2510 Atherosclerotic heart disease of native coronary artery without angina pectoris: Secondary | ICD-10-CM | POA: Diagnosis not present

## 2015-01-21 DIAGNOSIS — K760 Fatty (change of) liver, not elsewhere classified: Secondary | ICD-10-CM | POA: Diagnosis not present

## 2015-02-04 DIAGNOSIS — E780 Pure hypercholesterolemia: Secondary | ICD-10-CM | POA: Diagnosis not present

## 2015-02-04 DIAGNOSIS — I2510 Atherosclerotic heart disease of native coronary artery without angina pectoris: Secondary | ICD-10-CM | POA: Diagnosis not present

## 2015-02-16 DIAGNOSIS — I426 Alcoholic cardiomyopathy: Secondary | ICD-10-CM | POA: Diagnosis not present

## 2015-03-23 ENCOUNTER — Telehealth: Payer: Self-pay

## 2015-03-23 NOTE — Telephone Encounter (Signed)
Error

## 2015-03-29 ENCOUNTER — Encounter: Payer: Self-pay | Admitting: Family Medicine

## 2015-03-29 ENCOUNTER — Ambulatory Visit (INDEPENDENT_AMBULATORY_CARE_PROVIDER_SITE_OTHER): Payer: Medicare Other | Admitting: Family Medicine

## 2015-03-29 VITALS — BP 104/60 | HR 76 | Temp 97.5°F | Resp 18 | Wt 181.0 lb

## 2015-03-29 DIAGNOSIS — I5030 Unspecified diastolic (congestive) heart failure: Secondary | ICD-10-CM

## 2015-03-29 DIAGNOSIS — F1010 Alcohol abuse, uncomplicated: Secondary | ICD-10-CM | POA: Diagnosis not present

## 2015-03-29 DIAGNOSIS — I10 Essential (primary) hypertension: Secondary | ICD-10-CM | POA: Diagnosis not present

## 2015-03-29 NOTE — Assessment & Plan Note (Signed)
Doing well on half doses of meds, recent med adjustments by cardiology as well

## 2015-03-29 NOTE — Progress Notes (Signed)
Patient ID: Samuel Flores, male   DOB: Jul 29, 1941, 74 y.o.   MRN: [id number]   Subjective:    Patient ID: Samuel Flores, male    DOB: 1941/09/28, 74 y.o.   MRN: [id number]  Patient presents for 4 mos follow up   Pt here to f/u medications, no concerns. Continues to drink and does not want to quit. Seen by cardiology in March and 2D Echo, labs, taken off Maxide, started on HCTZ 12.5mg  once a day.  Medications reviewed The labs from March reviewed his cholesterol was within normal limits. His creatinine was 1.38 Able to walk 1/2 mile a day   Review Of Systems:  GEN- denies fatigue, fever, weight loss,weakness, recent illness HEENT- denies eye drainage, change in vision, nasal discharge, CVS- denies chest pain, palpitations RESP- + SOB on exertiion, cough, wheeze ABD- denies N/V, change in stools, abd pain GU- denies dysuria, hematuria, dribbling, incontinence MSK- denies joint pain, muscle aches, injury Neuro- denies headache, dizziness, syncope, seizure activity       Objective:    BP 104/60 mmHg  Pulse 76  Temp(Src) 97.5 F (36.4 C) (Oral)  Resp 18  Wt 181 lb (82.101 kg) GEN- NAD, alert and oriented x3 HEENT- PERRL, EOMI, non injected sclera, pink conjunctiva, MMM, oropharynx clear CVS- RRR, no murmur RESP-CTAB EXT- No edema Pulses- Radial, DP- 2+        Assessment & Plan:      Problem List Items Addressed This Visit    HBP (high blood pressure) - Primary   Relevant Medications   hydrochlorothiazide (MICROZIDE) 12.5 MG capsule   CHF with left ventricular diastolic dysfunction, NYHA class 2   Relevant Medications   hydrochlorothiazide (MICROZIDE) 12.5 MG capsule      Note: This dictation was prepared with Dragon dictation along with smaller phrase technology. Any transcriptional errors that result from this process are unintentional.

## 2015-03-29 NOTE — Assessment & Plan Note (Signed)
He has no plans for cessation even though he has chronic elevated LFTs and concern for cirrhosis of the liver. He is also does not want any preventative care done or any further immunizations

## 2015-03-29 NOTE — Patient Instructions (Signed)
Continue current medications You need to quit drinking! Try to cut back each day  F/U 6 months

## 2015-03-29 NOTE — Assessment & Plan Note (Signed)
Currently stable on meds, low dose daily diuretic

## 2015-04-01 ENCOUNTER — Encounter: Payer: Self-pay | Admitting: Family Medicine

## 2015-06-01 DIAGNOSIS — C44329 Squamous cell carcinoma of skin of other parts of face: Secondary | ICD-10-CM | POA: Diagnosis not present

## 2015-06-03 ENCOUNTER — Ambulatory Visit (INDEPENDENT_AMBULATORY_CARE_PROVIDER_SITE_OTHER): Payer: Medicare Other | Admitting: *Deleted

## 2015-06-03 VITALS — BP 100/72

## 2015-06-03 DIAGNOSIS — I10 Essential (primary) hypertension: Secondary | ICD-10-CM

## 2015-07-14 DIAGNOSIS — I426 Alcoholic cardiomyopathy: Secondary | ICD-10-CM | POA: Diagnosis not present

## 2015-07-14 DIAGNOSIS — I9589 Other hypotension: Secondary | ICD-10-CM | POA: Diagnosis not present

## 2015-07-14 DIAGNOSIS — Z87898 Personal history of other specified conditions: Secondary | ICD-10-CM | POA: Diagnosis not present

## 2015-07-14 DIAGNOSIS — I2510 Atherosclerotic heart disease of native coronary artery without angina pectoris: Secondary | ICD-10-CM | POA: Diagnosis not present

## 2015-08-31 ENCOUNTER — Other Ambulatory Visit: Payer: Self-pay | Admitting: Family Medicine

## 2015-10-04 ENCOUNTER — Encounter: Payer: Self-pay | Admitting: Family Medicine

## 2015-10-04 ENCOUNTER — Ambulatory Visit (INDEPENDENT_AMBULATORY_CARE_PROVIDER_SITE_OTHER): Payer: Medicare Other | Admitting: Family Medicine

## 2015-10-04 VITALS — BP 142/84 | HR 68 | Temp 98.2°F | Resp 16 | Ht 69.0 in | Wt 188.0 lb

## 2015-10-04 DIAGNOSIS — E7800 Pure hypercholesterolemia, unspecified: Secondary | ICD-10-CM

## 2015-10-04 DIAGNOSIS — I10 Essential (primary) hypertension: Secondary | ICD-10-CM

## 2015-10-04 DIAGNOSIS — M10071 Idiopathic gout, right ankle and foot: Secondary | ICD-10-CM | POA: Diagnosis not present

## 2015-10-04 DIAGNOSIS — H6123 Impacted cerumen, bilateral: Secondary | ICD-10-CM

## 2015-10-04 DIAGNOSIS — I5030 Unspecified diastolic (congestive) heart failure: Secondary | ICD-10-CM

## 2015-10-04 DIAGNOSIS — I272 Other secondary pulmonary hypertension: Secondary | ICD-10-CM | POA: Diagnosis not present

## 2015-10-04 DIAGNOSIS — M109 Gout, unspecified: Secondary | ICD-10-CM

## 2015-10-04 LAB — COMPREHENSIVE METABOLIC PANEL
ALT: 71 U/L — ABNORMAL HIGH (ref 9–46)
AST: 105 U/L — ABNORMAL HIGH (ref 10–35)
Albumin: 3.7 g/dL (ref 3.6–5.1)
Alkaline Phosphatase: 105 U/L (ref 40–115)
BUN: 12 mg/dL (ref 7–25)
CHLORIDE: 104 mmol/L (ref 98–110)
CO2: 24 mmol/L (ref 20–31)
CREATININE: 0.86 mg/dL (ref 0.70–1.18)
Calcium: 8.8 mg/dL (ref 8.6–10.3)
Glucose, Bld: 86 mg/dL (ref 70–99)
Potassium: 3.8 mmol/L (ref 3.5–5.3)
SODIUM: 142 mmol/L (ref 135–146)
Total Bilirubin: 1.2 mg/dL (ref 0.2–1.2)
Total Protein: 6.2 g/dL (ref 6.1–8.1)

## 2015-10-04 LAB — CBC WITH DIFFERENTIAL/PLATELET
BASOS PCT: 1 % (ref 0–1)
Basophils Absolute: 0.1 10*3/uL (ref 0.0–0.1)
EOS ABS: 0.1 10*3/uL (ref 0.0–0.7)
Eosinophils Relative: 2 % (ref 0–5)
HCT: 45.7 % (ref 39.0–52.0)
Hemoglobin: 16.3 g/dL (ref 13.0–17.0)
Lymphocytes Relative: 15 % (ref 12–46)
Lymphs Abs: 1.1 10*3/uL (ref 0.7–4.0)
MCH: 36.1 pg — ABNORMAL HIGH (ref 26.0–34.0)
MCHC: 35.7 g/dL (ref 30.0–36.0)
MCV: 101.1 fL — ABNORMAL HIGH (ref 78.0–100.0)
MONO ABS: 0.6 10*3/uL (ref 0.1–1.0)
MONOS PCT: 9 % (ref 3–12)
MPV: 9.7 fL (ref 8.6–12.4)
Neutro Abs: 5.1 10*3/uL (ref 1.7–7.7)
Neutrophils Relative %: 73 % (ref 43–77)
PLATELETS: 158 10*3/uL (ref 150–400)
RBC: 4.52 MIL/uL (ref 4.22–5.81)
RDW: 13.3 % (ref 11.5–15.5)
WBC: 7 10*3/uL (ref 4.0–10.5)

## 2015-10-04 LAB — LIPID PANEL
Cholesterol: 155 mg/dL (ref 125–200)
HDL: 69 mg/dL (ref >=40)
LDL Cholesterol: 64 mg/dL (ref <130)
TRIGLYCERIDES: 108 mg/dL (ref <150)
Total CHOL/HDL Ratio: 2.2 Ratio (ref <=5.0)
VLDL: 22 mg/dL (ref <30)

## 2015-10-04 LAB — URIC ACID: Uric Acid, Serum: 8.1 mg/dL — ABNORMAL HIGH (ref 4.0–7.8)

## 2015-10-04 NOTE — Assessment & Plan Note (Signed)
Blood pressure looks okay today. Is a little elevated at 142 however he is hypotensive when his medications or increase therefore will not change anything today. He is not having any symptoms.

## 2015-10-04 NOTE — Patient Instructions (Signed)
Pick Boost or Ensure  More veggies and fruits, avoid fried foods and sodium Decrease the alcohol  We will call with lab results

## 2015-10-04 NOTE — Assessment & Plan Note (Signed)
Due to his poor nutrition he is gaining weight around the midsection. He is not fluid overloaded. I spent time discussing proper nutrition decreasing the salt intake as well as the beef. He also needs to decrease his alcohol use but he declines doing so at this time.

## 2015-10-04 NOTE — Progress Notes (Signed)
Patient ID: Samuel Flores, male   DOB: 1941/10/28, 74 y.o.   MRN: [id number]   Subjective:    Patient ID: Samuel Flores, male    DOB: [birth year], 74 y.o.   MRN: [id number]  Patient presents for 6 months F/U  Pt here for F/U follwoed by Cardiology. I reviewed his last note. He was taken off the hydrochlorothiazide due to persistent dizzy spells and some hypotension his spironolactone was also decreased to 12.5 mg daily in addition to his enalapril 20 mg. He is also on atenolol. He states had these not had any dizzy spells but he still get short of breath which is unchanged. He has gained about 7 pounds since her last visit back in May he didn't see a lot of beef and pork he does not eat very much fruit. He is also still drinking a heavy amount of bourbon each day. He didn't state that he does eat TV dinners which she notices has a lot of sodium in them but he has not been swelling.  He has history of prostate cancer in the past. He declines any further workup for this. He also declines immunizations.  At the end of the visit he states that he occasionally gets some gout in his right great toe is typically does not take anything for it. Once and no he ever had episode if he could call to have this treated.  He states in the past he was given what he thinks his allopurinol but he did not want to take anything else on a daily basis.   Ears feel clogged up.  Due for fasting labs     Review Of Systems:  GEN-+ fatigue, fever, weight loss,weakness, recent illness HEENT- denies eye drainage, change in vision, nasal discharge, CVS- denies chest pain, palpitations RESP- +SOB, cough, wheeze ABD- denies N/V, change in stools, abd pain GU- denies dysuria, hematuria, dribbling, incontinence MSK- denies joint pain, muscle aches, injury Neuro- denies headache, dizziness, syncope, seizure activity       Objective:    BP 142/84 mmHg  Pulse 68  Temp(Src) 98.2 F (36.8 C) (Oral)  Resp 16  Ht 5'  9" (1.753 m)  Wt 188 lb (85.276 kg)  BMI 27.75 kg/m2 GEN- NAD, alert and oriented x3, + weight gain  HEENT- PERRL, EOMI, non injected sclera, pink conjunctiva, MMM, oropharynx clear, Left cerumen impaction, mild cerumen buildup right ear  Neck- Supple, no thyromegaly CVS- RRR, no murmur RESP-CTAB ABD-NABS,soft,NT,ND EXT- No edema Pulses- Radial, 2+        Assessment & Plan:      Problem List Items Addressed This Visit    Pulmonary hypertension ([place])   Hypercholesteremia - Primary   Relevant Orders   Lipid panel   HBP (high blood pressure)    Blood pressure looks okay today. Is a little elevated at 142 however he is hypotensive when his medications or increase therefore will not change anything today. He is not having any symptoms.      Relevant Orders   CBC with Differential/Platelet   Comprehensive metabolic panel   Gout   Relevant Orders   Uric Acid   CHF with left ventricular diastolic dysfunction, NYHA class 2 (HCC)    Due to his poor nutrition he is gaining weight around the midsection. He is not fluid overloaded. I spent time discussing proper nutrition decreasing the salt intake as well as the beef. He also needs to decrease his alcohol use but he declines doing so  at this time.       Other Visit Diagnoses    Cerumen impaction, bilateral        s/p irrigation at bedside       Note: This dictation was prepared with Dragon dictation along with smaller phrase technology. Any transcriptional errors that result from this process are unintentional.

## 2015-10-06 ENCOUNTER — Other Ambulatory Visit: Payer: Self-pay | Admitting: *Deleted

## 2015-10-06 MED ORDER — FOLIC ACID 1 MG PO TABS: 1.0000 mg | ORAL_TABLET | Freq: Every day | ORAL | Status: DC

## 2015-10-06 MED ORDER — VITAMIN B-1 100 MG PO TABS: 100.0000 mg | ORAL_TABLET | Freq: Every day | ORAL | Status: DC

## 2015-12-06 ENCOUNTER — Other Ambulatory Visit: Payer: Self-pay | Admitting: Family Medicine

## 2015-12-06 NOTE — Telephone Encounter (Signed)
Refill appropriate and filled per protocol. 

## 2016-01-19 DIAGNOSIS — I426 Alcoholic cardiomyopathy: Secondary | ICD-10-CM | POA: Diagnosis not present

## 2016-01-19 DIAGNOSIS — K760 Fatty (change of) liver, not elsewhere classified: Secondary | ICD-10-CM | POA: Diagnosis not present

## 2016-01-19 DIAGNOSIS — I2510 Atherosclerotic heart disease of native coronary artery without angina pectoris: Secondary | ICD-10-CM | POA: Diagnosis not present

## 2016-01-19 DIAGNOSIS — I272 Other secondary pulmonary hypertension: Secondary | ICD-10-CM | POA: Diagnosis not present

## 2016-01-20 DIAGNOSIS — I426 Alcoholic cardiomyopathy: Secondary | ICD-10-CM | POA: Diagnosis not present

## 2016-01-20 DIAGNOSIS — E7800 Pure hypercholesterolemia, unspecified: Secondary | ICD-10-CM | POA: Diagnosis not present

## 2016-03-02 ENCOUNTER — Other Ambulatory Visit: Payer: Self-pay | Admitting: Family Medicine

## 2016-03-05 ENCOUNTER — Other Ambulatory Visit: Payer: Self-pay | Admitting: Physician Assistant

## 2016-03-06 NOTE — Telephone Encounter (Signed)
Medication refilled per protocol. 

## 2016-05-17 ENCOUNTER — Ambulatory Visit (INDEPENDENT_AMBULATORY_CARE_PROVIDER_SITE_OTHER): Payer: Medicare Other | Admitting: Family Medicine

## 2016-05-17 ENCOUNTER — Encounter: Payer: Self-pay | Admitting: Family Medicine

## 2016-05-17 VITALS — BP 138/74 | HR 66 | Temp 98.6°F | Resp 18 | Ht 69.0 in | Wt 185.0 lb

## 2016-05-17 DIAGNOSIS — J0100 Acute maxillary sinusitis, unspecified: Secondary | ICD-10-CM | POA: Diagnosis not present

## 2016-05-17 MED ORDER — AZITHROMYCIN 250 MG PO TABS: ORAL_TABLET | ORAL | Status: DC

## 2016-05-17 NOTE — Patient Instructions (Addendum)
Take antibiotics as prescribed Use nasal saline  Use throat lozanges F/U Dec Physical

## 2016-05-17 NOTE — Progress Notes (Signed)
Patient ID: Samuel Flores, male   DOB: 1941/04/09, 75 y.o.   MRN: [id number]   Subjective:    Patient ID: Samuel Flores, male    DOB: 11/01/1941, 75 y.o.   MRN: [id number]  Patient presents for Illness Patient here with sore throat and right ear pain postnasal drip on and off for the past couple weeks. She's not had any fever states that he is not able to blow very much is not had any significant cough. He has had some hoarse voice on and off throughout the day. He is not taking anything over-the-counter. He does however continue to drink.  He did follow with his cardiologist in March    Review Of Systems:  GEN- denies fatigue, fever, weight loss,weakness, recent illness HEENT- denies eye drainage, change in vision, +nasal discharge, CVS- denies chest pain, palpitations RESP- denies SOB, cough, wheeze ABD- denies N/V, change in stools, abd pain GU- denies dysuria, hematuria, dribbling, incontinence MSK- denies joint pain, muscle aches, injury Neuro- denies headache, dizziness, syncope, seizure activity       Objective:    BP 138/74 mmHg  Pulse 66  Temp(Src) 98.6 F (37 C) (Oral)  Resp 18  Ht 5\' 9"  (1.753 m)  Wt 185 lb (83.915 kg)  BMI 27.31 kg/m2 GEN- NAD, alert and oriented x3 HEENT- PERRL, EOMI, non injected sclera, pink conjunctiva, MMM, oropharynx mild injection, TM clear bilat no effusion,  No  maxillary sinus tenderness, inflammed turbinates,+  Nasal drainage  Neck- Supple, no LAD CVS- RRR, no murmur RESP-CTAB Pulses- Radial 2+         Assessment & Plan:      Problem List Items Addressed This Visit    None    Visit Diagnoses    Acute maxillary sinusitis, recurrence not specified    -  Primary    Post nasal drip, mild sinusitis, cover with zpak, nasal saline, throat lozenges for hoarse voice, no red flags    Relevant Medications    azithromycin (ZITHROMAX) 250 MG tablet       Note: This dictation was prepared with Dragon dictation along with  smaller phrase technology. Any transcriptional errors that result from this process are unintentional.

## 2016-06-07 ENCOUNTER — Other Ambulatory Visit: Payer: Self-pay | Admitting: Family Medicine

## 2016-06-07 NOTE — Telephone Encounter (Signed)
Refill appropriate and filled per protocol. 

## 2016-06-20 ENCOUNTER — Ambulatory Visit: Payer: Self-pay | Admitting: Family Medicine

## 2016-06-27 ENCOUNTER — Ambulatory Visit (INDEPENDENT_AMBULATORY_CARE_PROVIDER_SITE_OTHER): Payer: Medicare Other | Admitting: Family Medicine

## 2016-06-27 ENCOUNTER — Encounter: Payer: Self-pay | Admitting: Family Medicine

## 2016-06-27 VITALS — BP 138/72 | HR 66 | Temp 98.8°F | Resp 18 | Ht 69.0 in | Wt 187.0 lb

## 2016-06-27 DIAGNOSIS — M10071 Idiopathic gout, right ankle and foot: Secondary | ICD-10-CM

## 2016-06-27 MED ORDER — COLCHICINE 0.6 MG PO TABS: 0.6000 mg | ORAL_TABLET | Freq: Every day | ORAL | 0 refills | Status: DC

## 2016-06-27 NOTE — Patient Instructions (Signed)
Take 1  Pill and repeat in 1 hour Then 1 pill daily for 5 days  F/U as previous

## 2016-06-27 NOTE — Progress Notes (Signed)
   Subjective:    Patient ID: Samuel Flores, male    DOB: [birth year], 75 y.o.   MRN: [id number]  Patient presents for R Foot Pain (x10 days- swelling and joint pain- pt thinks it may be gout- reports that it usually goes away after 3 or 4 days, but it has not improved) here with swelling of his right foot and ankle. States it feels like his previous gout flares he typically will only have pain for a couple days and he goes away but this is present for a week. In the past he was treated with colchicine he had an old prescription at home and it was 77 years old therefore he did not take it. He denies any fever no injury to the foot. States he is doing well otherwise.  Unfortunately he continues to drink he's also need a lot of red meat which she knows probably set off his gout   Review Of Systems:  GEN- denies fatigue, fever, weight loss,weakness, recent illness HEENT- denies eye drainage, change in vision, nasal discharge, CVS- denies chest pain, palpitations RESP- denies SOB, cough, wheeze MSK- + joint pain, muscle aches, injury Neuro- denies headache, dizziness, syncope, seizure activity       Objective:    BP 138/72 (BP Location: Left Arm, Patient Position: Sitting, Cuff Size: Normal)   Pulse 66   Temp 98.8 F (37.1 C) (Oral)   Resp 18   Ht 5\' 9"  (1.753 m)   Wt 187 lb (84.8 kg)   BMI 27.62 kg/m  GEN- NAD, alert and oriented x3 EXT- Right foot- swelling at ankle and mid foot, no pain in great toe, decreased ROM, mild warmth, pain with ambulation, fair ROM bilat knees, no effusion  Pulses- Radial, DP- 2+        Assessment & Plan:      Problem List Items Addressed This Visit    Gout - Primary    Had uric acid of 8.1 , 6 months ago, history of gout, with his ETOH intake and diet this definitely sounds like gout. 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 I will check his level today with his  labs.      Relevant Orders   CBC with Differential/Platelet   Comprehensive metabolic panel    Other Visit Diagnoses   None.     Note: This dictation was prepared with Dragon dictation along with smaller phrase technology. Any transcriptional errors that result from this process are unintentional.

## 2016-06-27 NOTE — Assessment & Plan Note (Signed)
Had uric acid of 8.1 , 6 months ago, history of gout, with his ETOH intake and diet this definitely sounds like gout. 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 I will check his level today with his labs.

## 2016-06-28 LAB — COMPREHENSIVE METABOLIC PANEL
ALBUMIN: 3.4 g/dL — AB (ref 3.6–5.1)
ALT: 37 U/L (ref 9–46)
AST: 64 U/L — AB (ref 10–35)
Alkaline Phosphatase: 96 U/L (ref 40–115)
BILIRUBIN TOTAL: 1.2 mg/dL (ref 0.2–1.2)
BUN: 11 mg/dL (ref 7–25)
CO2: 26 mmol/L (ref 20–31)
CREATININE: 0.77 mg/dL (ref 0.70–1.18)
Calcium: 8.6 mg/dL (ref 8.6–10.3)
Chloride: 102 mmol/L (ref 98–110)
Glucose, Bld: 122 mg/dL — ABNORMAL HIGH (ref 70–99)
Potassium: 3.5 mmol/L (ref 3.5–5.3)
SODIUM: 140 mmol/L (ref 135–146)
Total Protein: 6.1 g/dL (ref 6.1–8.1)

## 2016-06-28 LAB — CBC WITH DIFFERENTIAL/PLATELET
BASOS ABS: 0 {cells}/uL (ref 0–200)
BASOS PCT: 0 %
EOS PCT: 1 %
Eosinophils Absolute: 88 cells/uL (ref 15–500)
HCT: 46.1 % (ref 38.5–50.0)
HEMOGLOBIN: 15.7 g/dL (ref 13.0–17.0)
Lymphs Abs: 1232 cells/uL (ref 850–3900)
MCH: 34.6 pg — ABNORMAL HIGH (ref 27.0–33.0)
MCHC: 34.1 g/dL (ref 32.0–36.0)
MCV: 101.5 fL — ABNORMAL HIGH (ref 80.0–100.0)
MONOS PCT: 8 %
MPV: 9.8 fL (ref 7.5–12.5)
Monocytes Absolute: 704 cells/uL (ref 200–950)
Neutro Abs: 6776 cells/uL (ref 1500–7800)
Neutrophils Relative %: 77 %
PLATELETS: 173 10*3/uL (ref 140–400)
RBC: 4.54 MIL/uL (ref 4.20–5.80)
RDW: 13 % (ref 11.0–15.0)
WBC: 8.8 10*3/uL (ref 3.8–10.8)

## 2016-07-10 ENCOUNTER — Other Ambulatory Visit: Payer: Self-pay | Admitting: Family Medicine

## 2016-10-24 ENCOUNTER — Ambulatory Visit (INDEPENDENT_AMBULATORY_CARE_PROVIDER_SITE_OTHER): Payer: Medicare Other | Admitting: Family Medicine

## 2016-10-24 ENCOUNTER — Encounter: Payer: PRIVATE HEALTH INSURANCE | Admitting: Family Medicine

## 2016-10-24 ENCOUNTER — Encounter: Payer: Self-pay | Admitting: Family Medicine

## 2016-10-24 ENCOUNTER — Other Ambulatory Visit: Payer: Self-pay | Admitting: Family Medicine

## 2016-10-24 VITALS — BP 132/72 | HR 58 | Temp 97.7°F | Resp 16 | Ht 69.0 in | Wt 180.0 lb

## 2016-10-24 DIAGNOSIS — I10 Essential (primary) hypertension: Secondary | ICD-10-CM

## 2016-10-24 DIAGNOSIS — Z23 Encounter for immunization: Secondary | ICD-10-CM

## 2016-10-24 DIAGNOSIS — R7309 Other abnormal glucose: Secondary | ICD-10-CM | POA: Diagnosis not present

## 2016-10-24 DIAGNOSIS — E7800 Pure hypercholesterolemia, unspecified: Secondary | ICD-10-CM | POA: Diagnosis not present

## 2016-10-24 DIAGNOSIS — Z0000 Encounter for general adult medical examination without abnormal findings: Secondary | ICD-10-CM | POA: Diagnosis not present

## 2016-10-24 DIAGNOSIS — I5030 Unspecified diastolic (congestive) heart failure: Secondary | ICD-10-CM | POA: Diagnosis not present

## 2016-10-24 LAB — CBC WITH DIFFERENTIAL/PLATELET
Basophils Absolute: 88 cells/uL (ref 0–200)
Basophils Relative: 1 %
EOS ABS: 176 {cells}/uL (ref 15–500)
Eosinophils Relative: 2 %
HEMATOCRIT: 52.3 % — AB (ref 38.5–50.0)
HEMOGLOBIN: 18 g/dL — AB (ref 13.0–17.0)
LYMPHS ABS: 1232 {cells}/uL (ref 850–3900)
Lymphocytes Relative: 14 %
MCH: 35 pg — AB (ref 27.0–33.0)
MCHC: 34.4 g/dL (ref 32.0–36.0)
MCV: 101.6 fL — ABNORMAL HIGH (ref 80.0–100.0)
MONO ABS: 704 {cells}/uL (ref 200–950)
MPV: 10.5 fL (ref 7.5–12.5)
Monocytes Relative: 8 %
Neutro Abs: 6600 cells/uL (ref 1500–7800)
Neutrophils Relative %: 75 %
Platelets: 168 10*3/uL (ref 140–400)
RBC: 5.15 MIL/uL (ref 4.20–5.80)
RDW: 13.4 % (ref 11.0–15.0)
WBC: 8.8 10*3/uL (ref 3.8–10.8)

## 2016-10-24 NOTE — Progress Notes (Signed)
Subjective:   Patient presents for Medicare Annual/Subsequent preventive examination.   Medications reviewed  Followed by cardiology for his CHF history, continues to drink despite concern for cirrhosis and his CHF Weight down 7lbs drinks ensure some days, still has poor appetite bowels normal, no vomiting    Still get some nasal congestion/ post nasal drip and sore throat on and off- no fever, using nasal saline a times, feels some stuffiness in ears   History of prostate cancer does not want any follow up on this  Declines most immunizations   Has skin cancer in front of left ear- waiting until Jan to have this removed   Only takes 1/2 tablet of aldactone   Review Past Medical/Family/Social: Per EMR    Risk Factors  Current exercise habits: none Dietary issues discussed: Yes  Cardiac risk factors: CHF, HTN,ETOH abuse  Depression Screen  - PHQ - score 2 (Note: if answer to either of the following is "Yes", a more complete depression screening is indicated)  Over the past two weeks, have you felt down, depressed or hopeless? No Over the past two weeks, have you felt little interest or pleasure in doing things? yes Have you lost interest or pleasure in daily life? No Do you often feel hopeless? No Do you cry easily over simple problems? No   Activities of Daily Living  In your present state of health, do you have any difficulty performing the following activities?:  Driving? No  Managing money? No  Feeding yourself? No  Getting from bed to chair? No  Climbing a flight of stairs? yes  Preparing food and eating?: No  Bathing or showering? No  Getting dressed: No  Getting to the toilet? No  Using the toilet:No  Moving around from place to place: No  In the past year have you fallen or had a near fall?:No  Are you sexually active? No  Do you have more than one partner? No   Hearing Difficulties: yes  Do you often ask people to speak up or repeat themselves? yes Do you  experience ringing or noises in your ears? yes Do you have difficulty understanding soft or whispered voices? Yes   Do you feel that you have a problem with memory? No Do you often misplace items? No  Do you feel safe at home? Yes  Cognitive Testing  Alert? Yes Normal Appearance?Yes  Oriented to person? Yes Place? Yes  Time? Yes  Recall of three objects? Yes  Can perform simple calculations? Yes  Displays appropriate judgment?Yes  Can read the correct time from a watch face?Yes   List the Names of Other Physician/Practitioners you currently use: Cardiology- Dr. Nadyne Coombes     Screening Tests / Date Colonoscopy    OVER AGE and declines                   Zostavax utd Pneumonia- Declines  Influenza Vaccine  Tetanus/tdap- OVERDUE   ROS: GEN- denies fatigue, fever, weight loss,weakness, recent illness HEENT- denies eye drainage, change in vision,+ nasal discharge, CVS- denies chest pain, palpitations RESP- denies SOB, cough, wheeze ABD- denies N/V, change in stools, abd pain GU- denies dysuria, hematuria, dribbling, incontinence MSK- denies joint pain, muscle aches, injury Neuro- denies headache, dizziness, syncope, seizure activity  PHYSICAL:  GEN- NAD, alert and oriented x3 HEENT- PERRL, EOMI, non injected sclera, pink conjunctiva, MMM, oropharynx clear, nares clear rhinorrhea, enlarged turbinates, TM clear no effusion, mild wax- no impaction, no maxillary sinus tenderness  Neck-  Supple, no bruit  CVS- RR, HR 60 no murmur RESP-CTAB ABD-NABS,soft,NT,ND  Skin- bleeding skin lesions in front of left ear, multiple seb keratosis on face, neck, few AK  EXT- No edema Pulses- Radial - 2+    Assessment:    Annual wellness medicare exam   Plan:    During the course of the visit the patient was educated and counseled about appropriate screening and preventive services including:  Declines most preventative medication   Flu shot given Screen NEG  for depression. Appears  compensated from CHF, no change to duiretic, blood pressure controlled. No symptomatic with HR of 60 will monitor for now, he is on atenolol.   Check fasting labs today    Will see cardiology in March  F/U dermatology for skin cancer   With weight gain, he could have underlying cancer declines any work up for weight, history of prostate cancer, declines colonoscopy.     Diet review for nutrition referral? Yes ____ Not Indicated __x__  Patient Instructions (the written plan) was given to the patient.  Medicare Attestation  I have personally reviewed:  The patient's medical and social history  Their use of alcohol, tobacco or illicit drugs  Their current medications and supplements  The patient's functional ability including ADLs,fall risks, home safety risks, cognitive, and hearing and visual impairment  Diet and physical activities  Evidence for depression or mood disorders  The patient's weight, height, BMI, and visual acuity have been recorded in the chart. I have made referrals, counseling, and provided education to the patient based on review of the above and I have provided the patient with a written personalized care plan for preventive services.

## 2016-10-24 NOTE — Patient Instructions (Addendum)
Multivitamin once a day  We will call with labs Use flonase spray for drainage Flu shot given  F/U 6 months

## 2016-10-25 LAB — LIPID PANEL
Cholesterol: 154 mg/dL (ref <200)
HDL: 47 mg/dL (ref >40)
LDL CALC: 80 mg/dL (ref <100)
TRIGLYCERIDES: 134 mg/dL (ref <150)
Total CHOL/HDL Ratio: 3.3 Ratio (ref <5.0)
VLDL: 27 mg/dL (ref <30)

## 2016-10-25 LAB — COMPREHENSIVE METABOLIC PANEL
ALBUMIN: 3.8 g/dL (ref 3.6–5.1)
ALT: 49 U/L — ABNORMAL HIGH (ref 9–46)
AST: 74 U/L — AB (ref 10–35)
Alkaline Phosphatase: 100 U/L (ref 40–115)
BUN: 13 mg/dL (ref 7–25)
CALCIUM: 9.2 mg/dL (ref 8.6–10.3)
CO2: 28 mmol/L (ref 20–31)
Chloride: 102 mmol/L (ref 98–110)
Creat: 0.95 mg/dL (ref 0.70–1.18)
Glucose, Bld: 117 mg/dL — ABNORMAL HIGH (ref 70–99)
Potassium: 4.6 mmol/L (ref 3.5–5.3)
Sodium: 141 mmol/L (ref 135–146)
Total Bilirubin: 2.1 mg/dL — ABNORMAL HIGH (ref 0.2–1.2)
Total Protein: 6.4 g/dL (ref 6.1–8.1)

## 2016-10-26 LAB — HEMOGLOBIN A1C
Hgb A1c MFr Bld: 5.2 % (ref <5.7)
Mean Plasma Glucose: 103 mg/dL

## 2016-11-02 ENCOUNTER — Telehealth: Payer: Self-pay | Admitting: *Deleted

## 2016-11-02 NOTE — Telephone Encounter (Signed)
Received call from patient.   Reports that he continues to have post nasal drip causing sore throat.   Patient has been using Flonase and nasal saline, along with lozenges for sore throat.   Requested MD to advise if there is anything else he can do for sore throat.

## 2016-11-03 MED ORDER — AMOXICILLIN 500 MG PO CAPS: 500.0000 mg | ORAL_CAPSULE | Freq: Two times a day (BID) | ORAL | 0 refills | Status: DC

## 2016-11-03 NOTE — Telephone Encounter (Signed)
Add amoxicllin 500mg  BID for 7 days, he can also use chloroseptic spray,keep using the nasal sprays  Dx- pharyngitis

## 2016-11-03 NOTE — Telephone Encounter (Signed)
Prescription sent to pharmacy. .   Call placed to patient and patient made aware.  

## 2016-11-22 DIAGNOSIS — D485 Neoplasm of uncertain behavior of skin: Secondary | ICD-10-CM | POA: Diagnosis not present

## 2016-11-22 DIAGNOSIS — C44319 Basal cell carcinoma of skin of other parts of face: Secondary | ICD-10-CM | POA: Diagnosis not present

## 2016-11-22 DIAGNOSIS — L570 Actinic keratosis: Secondary | ICD-10-CM | POA: Diagnosis not present

## 2016-11-25 ENCOUNTER — Other Ambulatory Visit: Payer: Self-pay | Admitting: Physician Assistant

## 2016-11-29 ENCOUNTER — Other Ambulatory Visit: Payer: Self-pay | Admitting: Physician Assistant

## 2016-12-01 NOTE — Telephone Encounter (Signed)
Rx filled per protocol  

## 2016-12-08 ENCOUNTER — Telehealth: Payer: Self-pay | Admitting: *Deleted

## 2016-12-08 MED ORDER — COLCHICINE 0.6 MG PO TABS: 0.6000 mg | ORAL_TABLET | Freq: Every day | ORAL | 0 refills | Status: DC

## 2016-12-08 NOTE — Telephone Encounter (Signed)
Received call from patient.   Reports that he is having acute gout exacerbation in L foot.   Requested refill on [place].   Prescription sent to pharmacy.

## 2016-12-28 ENCOUNTER — Encounter: Payer: Self-pay | Admitting: Family Medicine

## 2016-12-28 ENCOUNTER — Ambulatory Visit (INDEPENDENT_AMBULATORY_CARE_PROVIDER_SITE_OTHER): Payer: Medicare Other | Admitting: Family Medicine

## 2016-12-28 VITALS — BP 138/78 | HR 68 | Temp 98.3°F | Resp 14 | Ht 69.0 in | Wt 183.0 lb

## 2016-12-28 DIAGNOSIS — M1A09X0 Idiopathic chronic gout, multiple sites, without tophus (tophi): Secondary | ICD-10-CM | POA: Diagnosis not present

## 2016-12-28 MED ORDER — PREDNISONE 10 MG PO TABS
ORAL_TABLET | ORAL | 0 refills | Status: DC
Start: 2016-12-28 — End: 2017-01-29

## 2016-12-28 NOTE — Progress Notes (Signed)
   Subjective:    Patient ID: Samuel Flores, male    DOB: 1940/11/29, 76 y.o.   MRN: [id number]  Patient presents for L Foot Pain (has chronic gout in L Great Toe, but pain noted in entire foot now)  Pt here with left great toe and foot pain. Has history of gout. HE HAS HAD pain and swelling for past 3 weeks. He called in 2 weeks ago and his colchicine was refilled he took as prescribed but pain did not improve and swelling. Denies any injury to foot. Feels fine otherwise, no fever, no chills. No knee pain    Review Of Systems:  GEN- denies fatigue, fever, weight loss,weakness, recent illness HEENT- denies eye drainage, change in vision, nasal discharge, CVS- denies chest pain, palpitations RESP- denies SOB, cough, wheeze ABD- denies N/V, change in stools, abd pain GU- denies dysuria, hematuria, dribbling, incontinence MSK-+ joint pain, muscle aches, injury Neuro- denies headache, dizziness, syncope, seizure activity       Objective:    BP 138/78   Pulse 68   Temp 98.3 F (36.8 C) (Oral)   Resp 14   Ht 5\' 9"  (1.753 m)   Wt 183 lb (83 kg)   SpO2 98%   BMI 27.02 kg/m  GEN- NAD, alert and oriented x3 HEENT- PERRL, EOMI, non injected sclera, pink conjunctiva, MMM, oropharynx clear CVS- irregular rhythem, normal rate , no murmur RESP-CTAB EXT- left foot., swelling of great toe, mild erythema, swelling extends across top of foot, mild TTP great toe, fair ROM ankle, knees  Pulses- Radial  2+, DP palpated but diminished with swelling         Assessment & Plan:      Problem List Items Addressed This Visit    Gout - Primary    Based on history, ETOH intake, I think this is gout flare. He did not respond to the colchicine . Start prednisone taper. Check uric acid. Pending results of renal function will add allopurinol as well to help prevent exacerbations.  He had irregular rhythem on exam, he has had some arrythmia in past and follows with cardiology. I asked to do EKG  and he refused, states he was only here for his foot and would see his cardiologist next month. Advised to go to ER if he gets chest pain, worsening SOB (has SOB at baseline)      Relevant Orders   Uric acid (Completed)   CBC with Differential/Platelet (Completed)   Basic metabolic panel (Completed)      Note: This dictation was prepared with Dragon dictation along with smaller phrase technology. Any transcriptional errors that result from this process are unintentional.

## 2016-12-28 NOTE — Patient Instructions (Addendum)
Take prednisone as prescribed  We will call with lab results Go to ER if you have chest pain F/U as needed

## 2016-12-29 ENCOUNTER — Encounter: Payer: Self-pay | Admitting: Family Medicine

## 2016-12-29 ENCOUNTER — Other Ambulatory Visit: Payer: Self-pay | Admitting: *Deleted

## 2016-12-29 LAB — CBC WITH DIFFERENTIAL/PLATELET
BASOS PCT: 1 %
Basophils Absolute: 102 cells/uL (ref 0–200)
EOS ABS: 204 {cells}/uL (ref 15–500)
Eosinophils Relative: 2 %
HEMATOCRIT: 48.3 % (ref 38.5–50.0)
Hemoglobin: 16.7 g/dL (ref 13.0–17.0)
LYMPHS PCT: 14 %
Lymphs Abs: 1428 cells/uL (ref 850–3900)
MCH: 35.3 pg — ABNORMAL HIGH (ref 27.0–33.0)
MCHC: 34.6 g/dL (ref 32.0–36.0)
MCV: 102.1 fL — ABNORMAL HIGH (ref 80.0–100.0)
MONO ABS: 1122 {cells}/uL — AB (ref 200–950)
MPV: 10 fL (ref 7.5–12.5)
Monocytes Relative: 11 %
NEUTROS ABS: 7344 {cells}/uL (ref 1500–7800)
Neutrophils Relative %: 72 %
PLATELETS: 238 10*3/uL (ref 140–400)
RBC: 4.73 MIL/uL (ref 4.20–5.80)
RDW: 13.4 % (ref 11.0–15.0)
WBC: 10.2 10*3/uL (ref 3.8–10.8)

## 2016-12-29 LAB — BASIC METABOLIC PANEL
BUN: 12 mg/dL (ref 7–25)
CHLORIDE: 103 mmol/L (ref 98–110)
CO2: 25 mmol/L (ref 20–31)
Calcium: 8.8 mg/dL (ref 8.6–10.3)
Creat: 0.95 mg/dL (ref 0.70–1.18)
Glucose, Bld: 126 mg/dL — ABNORMAL HIGH (ref 70–99)
POTASSIUM: 3.8 mmol/L (ref 3.5–5.3)
Sodium: 142 mmol/L (ref 135–146)

## 2016-12-29 LAB — URIC ACID: Uric Acid, Serum: 8.2 mg/dL — ABNORMAL HIGH (ref 4.0–8.0)

## 2016-12-29 MED ORDER — ALLOPURINOL 100 MG PO TABS: 100.0000 mg | ORAL_TABLET | Freq: Every day | ORAL | 6 refills | Status: DC

## 2016-12-29 NOTE — Assessment & Plan Note (Signed)
Based on history, ETOH intake, I think this is gout flare. He did not respond to the colchicine . Start prednisone taper. Check uric acid. Pending results of renal function will add allopurinol as well to help prevent exacerbations.  He had irregular rhythem on exam, he has had some arrythmia in past and follows with cardiology. I asked to do EKG and he refused, states he was only here for his foot and would see his cardiologist next month. Advised to go to ER if he gets chest pain, worsening SOB (has SOB at baseline)

## 2017-01-03 ENCOUNTER — Other Ambulatory Visit: Payer: Self-pay | Admitting: Family Medicine

## 2017-01-17 DIAGNOSIS — I2510 Atherosclerotic heart disease of native coronary artery without angina pectoris: Secondary | ICD-10-CM | POA: Diagnosis not present

## 2017-01-17 DIAGNOSIS — Z8679 Personal history of other diseases of the circulatory system: Secondary | ICD-10-CM | POA: Diagnosis not present

## 2017-01-17 DIAGNOSIS — I4891 Unspecified atrial fibrillation: Secondary | ICD-10-CM | POA: Diagnosis not present

## 2017-01-17 DIAGNOSIS — R0602 Shortness of breath: Secondary | ICD-10-CM | POA: Diagnosis not present

## 2017-01-29 ENCOUNTER — Telehealth: Payer: Self-pay | Admitting: *Deleted

## 2017-01-29 MED ORDER — PREDNISONE 10 MG PO TABS: ORAL_TABLET | ORAL | 0 refills | Status: DC

## 2017-01-29 NOTE — Telephone Encounter (Signed)
Received call from patient.    Requested refill on prednisone for gout flare. States that he is having flare at this time.   Also states that preventative is ineffective.   MD please advise.

## 2017-01-29 NOTE — Telephone Encounter (Signed)
Okay to refill prednisone taper The allopurinol will take some time to bring the uric acid down, so keep taking it,

## 2017-01-29 NOTE — Telephone Encounter (Signed)
Prescription sent to pharmacy. .   Call placed to patient and patient made aware.  

## 2017-02-06 DIAGNOSIS — I4891 Unspecified atrial fibrillation: Secondary | ICD-10-CM | POA: Diagnosis not present

## 2017-02-12 DIAGNOSIS — Z8679 Personal history of other diseases of the circulatory system: Secondary | ICD-10-CM | POA: Diagnosis not present

## 2017-02-12 DIAGNOSIS — I2510 Atherosclerotic heart disease of native coronary artery without angina pectoris: Secondary | ICD-10-CM | POA: Diagnosis not present

## 2017-02-12 DIAGNOSIS — I4891 Unspecified atrial fibrillation: Secondary | ICD-10-CM | POA: Diagnosis not present

## 2017-02-12 DIAGNOSIS — K700 Alcoholic fatty liver: Secondary | ICD-10-CM | POA: Diagnosis not present

## 2017-02-14 DIAGNOSIS — I4891 Unspecified atrial fibrillation: Secondary | ICD-10-CM | POA: Diagnosis not present

## 2017-02-16 ENCOUNTER — Telehealth: Payer: Self-pay | Admitting: Family Medicine

## 2017-02-16 MED ORDER — ALLOPURINOL 100 MG PO TABS: 200.0000 mg | ORAL_TABLET | Freq: Every day | ORAL | 5 refills | Status: DC

## 2017-02-16 MED ORDER — PREDNISONE 10 MG PO TABS
ORAL_TABLET | ORAL | 0 refills | Status: DC
Start: 2017-02-16 — End: 2017-02-27

## 2017-02-16 NOTE — Telephone Encounter (Signed)
Increase allopurinol to 200mg  once a day, he needs to take this in order to keep the uric acid down I want to avoid recurrent prescriptions of  prednisone las this has long term side effects  Send prednisone 40mg  x 2 days, 20mg   2 days, 10mg  x 2 days. He needs OV in 4 weeks, to recheck Gout levels

## 2017-02-16 NOTE — Telephone Encounter (Signed)
Pt called in saying he is still having problems with his feet. The last time this happened he said doctor [place] called in prednisone for him. He wants to know if we can call it in again.

## 2017-02-16 NOTE — Telephone Encounter (Signed)
Pt made aware of provider recommendations and medication changes.  Rx's to pharmacy and 4 week appt made.

## 2017-02-18 DIAGNOSIS — I4891 Unspecified atrial fibrillation: Secondary | ICD-10-CM

## 2017-02-18 NOTE — H&P (Signed)
OFFICE VISIT NOTES COPIED TO EPIC FOR DOCUMENTATION  . History of Present Illness Laverda Page MD; 02/12/2017 3:42 PM) Patient words: Last O/V 01/17/2017; 2 Week F/U for A-Fib, Echo results - Pt still has some swelling in his L foot but was told by his PCP that it is Gout - Pt has questions about some of his medications.  The patient is a 76 year old male who presents for a follow-up for Follow-up for Atrial fibrillation.  Additional reasons for visit:  Follow-up for CAD is described as the following: He has history of severe diffuse coronary artery disease by coronary angiography on 01/09/2013 without high-grade stenosis, Patient prsents for 1 month of new onset A. FIbrillation. He continues to have dyspnea on excertion and has noticed that it has recently gotten worse. I had seen him in March 2018 and added metoprolol along with diltiazem for heart rate control. He has history of alcoholic cardiomyopathy. EF at improved. His echocardiogram in 2015 had revealed LVEF 40-45%. However echocardiogram in 2016 with aggressive medical therapy, his EF is normalized.  He has a history of cirrhosis/fatty liver also. He continues to drink alcohol; however, has cut back to about 6 oz a day. No PND or orthopnea. About 2 weeks ago had acute onset of severe pain in his left greater toe, and was diagnosed with gout and received prednisone therapy and now is presently on allopurinol. Symptoms of pain has improved. Still has leg edema. Denies any symptoms to suggest TIA or claudication, he has not had any bleeding diathesis. Otherwise tolerating all the medications well.    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 Normal left ventricle systolic function. Stress sestamibi 10/13/11: Inferior wall scar without ischemia. EF 46%. low  risk Essential hypertension, benign (I10)  Hyperlipidemia, mixed (E78.2)  Angina pectoris (I20.9)  Shortness of breath (R06.02)  Other emphysema (J43.8)  Pulmonary hypertension, secondary  History of alcoholic cardiomyopathy (E32.12)  Echocardiogram 02/06/2017: Left ventricle cavity is normal in size. Left ventricle regional wall motion findings: No wall motion abnormalities. Visual EF is 65-70%. Unable to evaluate diastolic function due to A. Fibrillation. Left atrial cavity is moderately dilated. LA measures 4.7 cm. Mild tricuspid regurgitation. Mild pulmonary hypertension. Pulmonary artery systolic pressure is estimated at 31 mm Hg. Compared to the study done on 02/16/2015, no significant change. Generalized abdominal pain (R10.84)  Hypotension, iatrogenic (I95.89)  H/O: alcohol abuse (Y48.250)  Labwork  01/20/2016: Total cholesterol 156, triglycerides 155, HDL 50, LDL 75, creatinine 0.92, potassium 3.9, AST 87, ALT 63, CMP otherwise normal, H&H normal with slightly macrocytic indices, CBC otherwise normal, TSH 4.69 02/04/2015: Total cholesterol 146, triglycerides 206, HDL 41, LDL 64, LDL particle # 939, LP-IR score 67, serum glucose 107, creatinine 1.38, AST 61, ALT 59, CMP otherwise normal 12/11/2014: BUN 21, serum creatinine 1.44. Calcium 4.6. On 11/27/2014, potassium 6.0 06/20/2014: Normal CMP, eGFR 78m. 03/16/2014: Total cholesterol 141, triglycerides 122, HDL 45, LDL 72. Nonspecific abnormal electrocardiogram (ECG) (EKG) (794.31)  Fatty infiltration of liver (K76.0) 04/29/2014 Ultrasound of the abdomen 04/29/2014: Increased echogenicity of the hepatic parenchyma suggestive of diffuse hepatocellular disease or fatty infiltration. No other significant abnormalities evident. Labs 06/20/2014: Normal CMP, eGFR 661m Atrial fibrillation, new onset (I48.91) 01/17/2017 CHADVASC score: 3, high risk, 3.2% yearly risk of CVA  Allergies (April Garrison; 02/12/2017 1:56 PM) No Known Drug Allergies  01/10/2013  Family History (April Garrison; 02/12/2017 2:07 PM)  Mother  Deceased. at age 39 from lung cancer. Father  Deceased. at age 13 from prostate cancer/alzheimer's. Siblings  2 Sisters, Older; -oldest sister has had heart problems 1 Brother Deceased due to Pancreatic Cancer  Social History (April Garrison; 02-17-2017 2:08 PM) Current tobacco use  Former smoker. quit 03/2013 Alcohol Use  Drinks hard liquor. 3 oz. of Bourbon per day Marital status  Married. Number of Adult (age 50 or over) Dependents  2. Living Situation  Lives with spouse.  Past Surgical History (April Garrison; Feb 17, 2017 1:56 PM) Prostate cancer 1994-removed prostate.  Basal Cell Carcinoma Excision 02/2014 on head Basal Cell Carcinoma Excision 04/2015 on his face.  Medication History (April Garrison; 02/17/2017 2:18 PM) DilTIAZem CD (180MG Capsule ER 24HR, 1 (one) Capsule Oral daily, Taken starting 17-Feb-2017) Active. Metoprolol Tartrate (37.5MG Tablet, 1 (one) Tablet Oral two times daily, Taken starting 02/06/2017) Active. (stopped atenolol) Eliquis (5MG Tablet, 1 (one) Tablet Oral two times daily, Taken starting 01/17/2017) Active. Aldactone (25MG Tablet, 1/2 (one half) Tablet Tablet Oral every morning, Taken starting 03/26/2016) Active. Atorvastatin Calcium (20MG Tablet,  (one half) Tablet Tablet Tab Oral daily, Taken starting 06/22/2014) Active. Aspirin (81MG Tablet, 1 Oral daily, before a meal) Active. Nitrostat (0.4MG Tab Sublingual, 1 under tongue Sublingual as directed for chest pain as needed) Active. Allopurinol (100MG Tablet, 1 Oral daily) Active. Enalapril Maleate (20MG Tablet,  (one half) Oral daily) Active. (Hyperkalemia on 20 mg daily) Medications Reconciled (Pt brought Medications)  Diagnostic Studies History (April Garrison; February 17, 2017 1:56 PM) Echocardiogram 02/06/2017 Left ventricle cavity is normal in size. Left ventricle regional wall motion findings: No wall motion  abnormalities. Visual EF is 65-70%. Unable to evaluate diastolic function due to A. Fibrillation. Left atrial cavity is moderately dilated. LA measures 4.7 cm. Mild tricuspid regurgitation. Mild pulmonary hypertension. Pulmonary artery systolic pressure is estimated at 31 mm Hg. Compared to the study done on 02/16/2015, no significant change.  Other Problems (April Garrison; February 17, 2017 1:56 PM) Dizziness and giddiness (R42)  TIA 12/2009.  Hospitalized at [facility] 5/8-5/14/12 to drain blood from brain (subdural hematoma) due to a fall in 02/2011.     Review of Systems Laverda Page MD; 02-17-17 3:41 PM) General Present- Fatigue and Weight Gain. Not Present- Fever and Night Sweats. Skin Not Present- Itching and Rash. HEENT Not Present- Headache. Respiratory Present- Difficulty Breathing on Exertion (mild and stable and improved since quitting smoking). Not Present- Bloody sputum and Wakes up from Sleep Wheezing or Short of Breath. Cardiovascular Not Present- Claudications, Fainting, Orthopnea and Swelling of Extremities. Gastrointestinal Not Present- Abdominal Pain, Belching, Bloody Stool, Constipation, Nausea and Vomiting. Musculoskeletal Present- Joint Swelling (left foot swelling and pian.). Neurological Not Present- Dizziness and Dysarthria. Hematology Not Present- Blood Clots, Easy Bruising and Nose Bleed. All other systems negative  Vitals (April Garrison; February 17, 2017 2:21 PM) 02-17-17 1:58 PM Weight: 183 lb Height: 71in Body Surface Area: 2.03 m Body Mass Index: 25.52 kg/m  Pulse: 96 (Regular)  P.OX: 96% (Room air) BP: 112/58 (Sitting, Left Arm, Standard)       Physical Exam Laverda Page MD; 02-17-17 2:36 PM) General Mental Status-Alert. General Appearance-Cooperative, Appears stated age, Not in acute distress. Orientation-Oriented X3. Build & Nutrition-Well built.  Head and Neck Thyroid Gland Characteristics - no palpable nodules, no palpable  enlargement.  Chest and Lung Exam Chest and lung exam reveals -quiet, even and easy respiratory effort with no use of accessory muscles. Palpation Tender - No chest wall tenderness.  Cardiovascular Cardiovascular examination reveals -carotid  auscultation reveals no bruits, femoral artery auscultation bilaterally reveals normal pulses, no bruits, no thrills and normal pedal pulses bilaterally. Inspection Jugular vein - Right - No Distention. Auscultation Rhythm - Irregular and Tachycardic. Heart Sounds - S1 is variable and S2 WNL. Murmurs & Other Heart Sounds - Murmur - No murmur.  Abdomen Palpation/Percussion Normal exam - Non Tender and No hepatosplenomegaly. Auscultation Normal exam - Bowel sounds normal.  Peripheral Vascular Lower Extremity Palpation - Edema - Left - 2+ Pitting edema(gouty arthritis left greater toe and inflamed foot.). Homan's sign - Bilateral - Negative (normal).  Neurologic Motor-Grossly intact without any focal deficits.  Musculoskeletal - Did not examine.    Assessment & Plan Laverda Page MD; 02/12/2017 3:42 PM) Atrial fibrillation, new onset (I48.91) Story: CHADVASC score: 3, high risk, 3.2% yearly risk of CVA Impression: EKG 02/12/2017: Atrial fibrillation with rapid ventricular response, went to the rate between 100 110 eats per minute, left axis deviation, left plantar fascicular block. Poor R-wave progression, cannot exclude anterolateral infarct old. Nonspecific T abnormality. No significant change from EKG 01/17/2017: Atrial fibrillation with RVR at the rate of 166 bpm, HR better controlled. Current Plans Changed Metoprolol Tartrate 50MG, 1 (one) Tablet two times daily, #60, 30 days starting 02/12/2017, Ref. x1. Continued DilTIAZem CD 180MG, 1 (one) Capsule daily, #30, 30 days starting 02/12/2017, Ref. x2. METABOLIC PANEL, BASIC (21224) Complete electrocardiogram (93000) History of alcoholic cardiomyopathy (M25.00) Story:  Echocardiogram 02/06/2017: Left ventricle cavity is normal in size. Left ventricle regional wall motion findings: No wall motion abnormalities. Visual EF is 65-70%. Unable to evaluate diastolic function due to A. Fibrillation. Left atrial cavity is moderately dilated. LA measures 4.7 cm. Mild tricuspid regurgitation. Mild pulmonary hypertension. Pulmonary artery systolic pressure is estimated at 31 mm Hg. Compared to the study done on 02/16/2015, no significant change. EF on 12/15/2013: 45%. Impression: Patient drinks about 8-10 Oz of Bourbon a day.  EKG 01/19/2016: Number sinus rhythm at rate of 74 bpm, left axis deviation, left anterior fascicular block. Poor R-wave progression, pulmonary disease pattern. Diffuse nonspecific T abnormality. No significant change from EKG 01/13/2013. Frequent PVC not present. 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 Normal left ventricle systolic function.  Stress sestamibi 10/13/11: Inferior wall scar without ischemia. EF 46%. low risk Labwork Story: 02/14/2017: creatinine 0.91, potassium 3.6, BMP normal. Labs 12/28/2016: Potassium 3.8, serum glucose 126, BUN 12, creatinine 0.95. Uric acid 8.2. HB 16.7/HCT 48.3, microcytic indicis. Platelets 238. A1c 5.2%. Total cholesterol 124, triglycerides 134, HDL 47, LDL 80.  01/20/2016: Total cholesterol 156, triglycerides 155, HDL 50, LDL 75, creatinine 0.92, potassium 3.9, AST 87, ALT 63, CMP otherwise normal, H&H normal with slightly macrocytic indices, CBC otherwise normal, TSH 4.69  02/04/2015: Total cholesterol 146, triglycerides 206, HDL 41, LDL 64, LDL particle # 939, LP-IR score 67, serum glucose 107, creatinine 1.38, AST 61, ALT 59, CMP otherwise normal  12/11/2014: BUN 21, serum creatinine 1.44. Calcium 4.6. On 11/27/2014, potassium 6.0 Fatty liver, alcoholic (B70.4) Story: Ultrasound of the abdomen 04/27/2014:  Increased echogenicity of hepatic parenchyma is noted suggesting diffuse hepatocellular disease or fatty infiltration. No other abnormality seen in the right upper quadrant of the abdomen. Current Plans Mechanism of underlying disease process and action of medications discussed with the patient. I discussed primary/secondary prevention and also dietary counceling was done. He has reduced his alcohol intake to 6 Oz per day of burbon and has completely quit smoking and is remained abstinent.  Again discussed complete abstinence from alcohol. I'll discuss the findings of the echocardiogram, fortunately the ejection fraction is remained stable. He continues to persist in atrial fibrillation with RVR, I'll set him up for cardioversion.  He has developed new onset of gout in his left leg, leg edema is related to gout and not to diltiazem. We will increase the dose of metoprolol from 37.5 mg b.i.d. to 50 mg b.i.d. continue diltiazem. I'll see him back after the cardioversion.  I'm concerned about him developing hepatic carcinoma or cirrhosis of the liver in view of continued alcohol abuse. I offered him ultrasound of the abdomen which patient does not want to for now.  He also wanted to discontinue Eliquis due to cost, I will try to give him samples as much as I can patient does not have Medicaid the coverage until fall when he can control. I have also reviewed his recently performed labs after the acute onset of gout.  CC: Vic Blackbird, MD  Signed by Laverda Page, MD (02/12/2017 3:43 PM)

## 2017-02-20 ENCOUNTER — Ambulatory Visit (HOSPITAL_COMMUNITY): Payer: Medicare Other | Admitting: Anesthesiology

## 2017-02-20 ENCOUNTER — Encounter (HOSPITAL_COMMUNITY): Payer: Self-pay | Admitting: *Deleted

## 2017-02-20 ENCOUNTER — Ambulatory Visit (HOSPITAL_COMMUNITY)
Admission: RE | Admit: 2017-02-20 | Discharge: 2017-02-20 | Disposition: A | Discharge status: Home / Self Care | Payer: Medicare Other | Source: Ambulatory Visit | Attending: Cardiology | Admitting: Cardiology

## 2017-02-20 ENCOUNTER — Encounter (HOSPITAL_COMMUNITY)
Admission: RE | Disposition: A | Discharge status: Home / Self Care | Payer: Self-pay | Source: Ambulatory Visit | Attending: Cardiology

## 2017-02-20 DIAGNOSIS — I2584 Coronary atherosclerosis due to calcified coronary lesion: Secondary | ICD-10-CM | POA: Insufficient documentation

## 2017-02-20 DIAGNOSIS — K700 Alcoholic fatty liver: Secondary | ICD-10-CM | POA: Diagnosis not present

## 2017-02-20 DIAGNOSIS — I2510 Atherosclerotic heart disease of native coronary artery without angina pectoris: Secondary | ICD-10-CM | POA: Diagnosis not present

## 2017-02-20 DIAGNOSIS — Z87891 Personal history of nicotine dependence: Secondary | ICD-10-CM | POA: Insufficient documentation

## 2017-02-20 DIAGNOSIS — F1010 Alcohol abuse, uncomplicated: Secondary | ICD-10-CM | POA: Diagnosis not present

## 2017-02-20 DIAGNOSIS — Z7901 Long term (current) use of anticoagulants: Secondary | ICD-10-CM | POA: Insufficient documentation

## 2017-02-20 DIAGNOSIS — E7800 Pure hypercholesterolemia, unspecified: Secondary | ICD-10-CM | POA: Diagnosis not present

## 2017-02-20 DIAGNOSIS — I10 Essential (primary) hypertension: Secondary | ICD-10-CM | POA: Insufficient documentation

## 2017-02-20 DIAGNOSIS — E782 Mixed hyperlipidemia: Secondary | ICD-10-CM | POA: Insufficient documentation

## 2017-02-20 DIAGNOSIS — I426 Alcoholic cardiomyopathy: Secondary | ICD-10-CM | POA: Diagnosis not present

## 2017-02-20 DIAGNOSIS — R1084 Generalized abdominal pain: Secondary | ICD-10-CM | POA: Insufficient documentation

## 2017-02-20 DIAGNOSIS — I4891 Unspecified atrial fibrillation: Secondary | ICD-10-CM

## 2017-02-20 DIAGNOSIS — M109 Gout, unspecified: Secondary | ICD-10-CM | POA: Diagnosis not present

## 2017-02-20 DIAGNOSIS — I9589 Other hypotension: Secondary | ICD-10-CM | POA: Diagnosis not present

## 2017-02-20 DIAGNOSIS — Z7982 Long term (current) use of aspirin: Secondary | ICD-10-CM | POA: Insufficient documentation

## 2017-02-20 DIAGNOSIS — I272 Pulmonary hypertension, unspecified: Secondary | ICD-10-CM | POA: Diagnosis not present

## 2017-02-20 HISTORY — PX: CARDIOVERSION: SHX1299

## 2017-02-20 LAB — POCT I-STAT 4, (NA,K, GLUC, HGB,HCT)
GLUCOSE: 146 mg/dL — AB (ref 65–99)
HEMATOCRIT: 45 % (ref 39.0–52.0)
Hemoglobin: 15.3 g/dL (ref 13.0–17.0)
POTASSIUM: 3.5 mmol/L (ref 3.5–5.1)
Sodium: 142 mmol/L (ref 135–145)

## 2017-02-20 SURGERY — CARDIOVERSION
Anesthesia: Monitor Anesthesia Care

## 2017-02-20 MED ORDER — SODIUM CHLORIDE 0.9 % IV SOLN
INTRAVENOUS | Status: DC
  Administered 2017-02-20: 13:00:00 via INTRAVENOUS

## 2017-02-20 MED ORDER — METOPROLOL TARTRATE 5 MG/5ML IV SOLN
INTRAVENOUS | Status: DC | PRN
  Administered 2017-02-20: 5 mg via INTRAVENOUS

## 2017-02-20 MED ORDER — PROPOFOL 10 MG/ML IV BOLUS
INTRAVENOUS | Status: DC | PRN
  Administered 2017-02-20: 75 mg via INTRAVENOUS

## 2017-02-20 MED ORDER — METOPROLOL TARTRATE 5 MG/5ML IV SOLN
INTRAVENOUS | Status: AC
  Filled 2017-02-20: qty 5

## 2017-02-20 MED ORDER — LIDOCAINE HCL (CARDIAC) 20 MG/ML IV SOLN
INTRAVENOUS | Status: DC | PRN
  Administered 2017-02-20: 45 mg via INTRAVENOUS

## 2017-02-20 NOTE — Discharge Instructions (Signed)
Electrical Cardioversion, Care After °This sheet gives you information about how to care for yourself after your procedure. Your health care provider may also give you more specific instructions. If you have problems or questions, contact your health care provider. °What can I expect after the procedure? °After the procedure, it is common to have: °· Some redness on the skin where the shocks were given. °Follow these instructions at home: °· Do not drive for 24 hours if you were given a medicine to help you relax (sedative). °· Take over-the-counter and prescription medicines only as told by your health care provider. °· Ask your health care provider how to check your pulse. Check it often. °· Rest for 48 hours after the procedure or as told by your health care provider. °· Avoid or limit your caffeine use as told by your health care provider. °Contact a health care provider if: °· You feel like your heart is beating too quickly or your pulse is not regular. °· You have a serious muscle cramp that does not go away. °Get help right away if: °· You have discomfort in your chest. °· You are dizzy or you feel faint. °· You have trouble breathing or you are short of breath. °· Your speech is slurred. °· You have trouble moving an arm or leg on one side of your body. °· Your fingers or toes turn cold or blue. °This information is not intended to replace advice given to you by your health care provider. Make sure you discuss any questions you have with your health care provider. °Document Released: 08/20/2013 Document Revised: 06/02/2016 Document Reviewed: 05/05/2016 °Elsevier Interactive Patient Education © 2017 [employer] ° °

## 2017-02-20 NOTE — CV Procedure (Signed)
Direct current cardioversion:  Indication symptomatic A. Fibrillation.  Procedure: Using 75 mg of IV Propofol and 40 IV Lidocaine (for reducing venous pain) for achieving deep sedation, synchronized direct current cardioversion performed. Patient was delivered with 120 Joules of electricity X 1 with success to NSR with frequent PVC and PAC. Patient tolerated the procedure well. No immediate complication noted.

## 2017-02-20 NOTE — Anesthesia Preprocedure Evaluation (Addendum)
Anesthesia Evaluation  Patient identified by MRN, date of birth, ID band Patient awake    Reviewed: Allergy & Precautions, NPO status , Patient's Chart, lab work & pertinent test results  Airway Mallampati: II  TM Distance: >3 FB Neck ROM: Full    Dental no notable dental hx.    Pulmonary former smoker,    breath sounds clear to auscultation       Cardiovascular hypertension, + CAD, +CHF and + DOE   Rhythm:Irregular Rate:Normal     Neuro/Psych    GI/Hepatic (+)     substance abuse  alcohol use,   Endo/Other    Renal/GU      Musculoskeletal  (+) Arthritis ,   Abdominal   Peds  Hematology   Anesthesia Other Findings   Reproductive/Obstetrics                            Anesthesia Physical Anesthesia Plan  ASA: III  Anesthesia Plan: General   Post-op Pain Management:    Induction: Intravenous  Airway Management Planned: Mask  Additional Equipment:   Intra-op Plan:   Post-operative Plan:   Informed Consent: I have reviewed the patients History and Physical, chart, labs and discussed the procedure including the risks, benefits and alternatives for the proposed anesthesia with the patient or authorized representative who has indicated his/her understanding and acceptance.     Plan Discussed with:   Anesthesia Plan Comments:         Anesthesia Quick Evaluation

## 2017-02-20 NOTE — Interval H&P Note (Signed)
History and Physical Interval Note:  02/20/2017 1:32 PM  Samuel Flores  has presented today for surgery, with the diagnosis of AFIB  The various methods of treatment have been discussed with the patient and family. After consideration of risks, benefits and other options for treatment, the patient has consented to  Procedure(s): CARDIOVERSION (N/A) as a surgical intervention .  The patient's history has been reviewed, patient examined, no change in status, stable for surgery.  I have reviewed the patient's chart and labs.  Questions were answered to the patient's satisfaction.     Adrian Prows

## 2017-02-20 NOTE — Transfer of Care (Signed)
Immediate Anesthesia Transfer of Care Note  Patient: Samuel Flores  Procedure(s) Performed: Procedure(s): CARDIOVERSION (N/A)  Patient Location: Endoscopy Unit  Anesthesia Type:General  Level of Consciousness: awake, alert  and oriented  Airway & Oxygen Therapy: Patient Spontanous Breathing and Patient connected to nasal cannula oxygen  Post-op Assessment: Report given to RN and Post -op Vital signs reviewed and stable  Post vital signs: Reviewed and stable  Last Vitals:  Vitals:   02/20/17 1347 02/20/17 1348  BP:  126/64  Pulse: (!) 101 98  Resp: (!) 21 (!) 21  Temp:      Last Pain:  Vitals:   02/20/17 1346  TempSrc: Oral         Complications: No apparent anesthesia complications

## 2017-02-21 NOTE — Anesthesia Postprocedure Evaluation (Addendum)
Anesthesia Post Note  Patient: Samuel Flores  Procedure(s) Performed: Procedure(s) (LRB): CARDIOVERSION (N/A)  Patient location during evaluation: Endoscopy Anesthesia Type: General Level of consciousness: awake and alert Pain management: pain level controlled Vital Signs Assessment: post-procedure vital signs reviewed and stable Respiratory status: spontaneous breathing, nonlabored ventilation, respiratory function stable and patient connected to nasal cannula oxygen Cardiovascular status: blood pressure returned to baseline and stable Postop Assessment: no signs of nausea or vomiting Anesthetic complications: no       Last Vitals:  Vitals:   02/20/17 1400 02/20/17 1408  BP: 137/69 137/77  Pulse: 86 85  Resp: 20 18  Temp:      Last Pain:  Vitals:   02/20/17 1346  TempSrc: Oral                 Jaylon Grode,JAMES TERRILL

## 2017-02-23 ENCOUNTER — Telehealth: Payer: Self-pay | Admitting: *Deleted

## 2017-02-23 NOTE — Telephone Encounter (Signed)
Received call from patient.   Reports that he has completed Prednisone taper and is taking increased dose of Allopurinol (200mg ).   Reports that he continues to have increased pain with gout flare. Reports that while on prednisone, pain was improved.   Reviewed note from 02/16/2017, and advised that MD does not want recurrent prescriptions of prednisone D/T possible long term effects.   Requested MD to advise any other Tx options.   Of note, prescription for Prednisone was sent in on 02/16/2017, #21 tabs- last prescription should have been for #14- patient has #7 tabs left over.

## 2017-02-23 NOTE — Telephone Encounter (Signed)
He can take 10mg  once a day until his visit  -since he has 7  Pills, he needs OV next Monday or Tuesday

## 2017-02-23 NOTE — Telephone Encounter (Signed)
Call placed to patient and patient made aware.   Appointment scheduled.  

## 2017-02-27 ENCOUNTER — Ambulatory Visit (INDEPENDENT_AMBULATORY_CARE_PROVIDER_SITE_OTHER): Payer: Medicare Other | Admitting: Family Medicine

## 2017-02-27 ENCOUNTER — Encounter: Payer: Self-pay | Admitting: Family Medicine

## 2017-02-27 VITALS — BP 132/72 | HR 68 | Temp 98.4°F | Resp 16 | Ht 69.0 in | Wt 184.0 lb

## 2017-02-27 DIAGNOSIS — M1A09X0 Idiopathic chronic gout, multiple sites, without tophus (tophi): Secondary | ICD-10-CM | POA: Diagnosis not present

## 2017-02-27 NOTE — Assessment & Plan Note (Signed)
Goal is Uric acid less than 6  Continue allopurinol Check renal function He does not have prescription assistance so trying to keep meds reasonable Discussed foods to avoid ETOH Need to decrease use of prednisone due to Side effects

## 2017-02-27 NOTE — Progress Notes (Signed)
   Subjective:    Patient ID: Samuel Flores, male    DOB: [birth year], 76 y.o.   MRN: [id number]  Patient presents for Follow-up (gout)  She had a follow-up his gout. He is calling multiple times complaining of gout in his feet. He had elevated uric acid level to 8.2a couple months ago. Started him on allopurinol he is up to 200 mg a day since last week. Abnormal liver function due to probable hepatic cirrhosis however his renal function is preserved. He feels like prednisone works the best over colcrys and cough as a problem. His gout is often worsened by his ETOH intake   Note had cardioverson last week for A fib  Review Of Systems:  GEN- denies fatigue, fever, weight loss,weakness, recent illness HEENT- denies eye drainage, change in vision, nasal discharge, CVS- denies chest pain, palpitations RESP- denies SOB, cough, wheeze ABD- denies N/V, change in stools, abd pain GU- denies dysuria, hematuria, dribbling, incontinence MSK- +joint pain, muscle aches, injury Neuro- denies headache, dizziness, syncope, seizure activity       Objective:    BP 132/72   Pulse 68   Temp 98.4 F (36.9 C) (Oral)   Resp 16   Ht 5\' 9"  (1.753 m)   Wt 184 lb (83.5 kg)   SpO2 98%   BMI 27.17 kg/m  GEN- NAD, alert and oriented x3 CVS- RRR, no murmur RESP-CTAB EXT- Left mild ankle  Edema/ toe NT, minimal swelling at base of toe  Pulses- Radial, DP- 2+        Assessment & Plan:      Problem List Items Addressed This Visit    Gout - Primary    Goal is Uric acid less than 6  Continue allopurinol Check renal function He does not have prescription assistance so trying to keep meds reasonable Discussed foods to avoid ETOH Need to decrease use of prednisone due to Side effects       Relevant Orders   Basic metabolic panel   Uric Acid      Note: This dictation was prepared with Dragon dictation along with smaller phrase technology. Any transcriptional errors that result from this  process are unintentional.

## 2017-02-27 NOTE — Patient Instructions (Addendum)
We will call with lab results  We will  patient assistance program  Cancel the may appointment Keep the June Appointment

## 2017-02-28 LAB — BASIC METABOLIC PANEL
BUN: 13 mg/dL (ref 7–25)
CALCIUM: 8.8 mg/dL (ref 8.6–10.3)
CHLORIDE: 102 mmol/L (ref 98–110)
CO2: 28 mmol/L (ref 20–31)
CREATININE: 1.05 mg/dL (ref 0.70–1.18)
Glucose, Bld: 171 mg/dL — ABNORMAL HIGH (ref 70–99)
Potassium: 3.5 mmol/L (ref 3.5–5.3)
Sodium: 142 mmol/L (ref 135–146)

## 2017-02-28 LAB — URIC ACID: Uric Acid, Serum: 4.4 mg/dL (ref 4.0–8.0)

## 2017-03-01 ENCOUNTER — Other Ambulatory Visit: Payer: Self-pay | Admitting: *Deleted

## 2017-03-01 ENCOUNTER — Telehealth: Payer: Self-pay | Admitting: *Deleted

## 2017-03-01 DIAGNOSIS — I2510 Atherosclerotic heart disease of native coronary artery without angina pectoris: Secondary | ICD-10-CM | POA: Diagnosis not present

## 2017-03-01 DIAGNOSIS — I4891 Unspecified atrial fibrillation: Secondary | ICD-10-CM | POA: Diagnosis not present

## 2017-03-01 DIAGNOSIS — Z8679 Personal history of other diseases of the circulatory system: Secondary | ICD-10-CM | POA: Diagnosis not present

## 2017-03-01 DIAGNOSIS — K700 Alcoholic fatty liver: Secondary | ICD-10-CM | POA: Diagnosis not present

## 2017-03-01 MED ORDER — APIXABAN 5 MG PO TABS: 5.0000 mg | ORAL_TABLET | Freq: Two times a day (BID) | ORAL | 12 refills | Status: DC

## 2017-03-01 NOTE — Telephone Encounter (Signed)
Prescription assistance for Eliquis completed and awaiting MD signature.

## 2017-03-06 DIAGNOSIS — C44319 Basal cell carcinoma of skin of other parts of face: Secondary | ICD-10-CM | POA: Diagnosis not present

## 2017-03-19 ENCOUNTER — Ambulatory Visit: Payer: Self-pay | Admitting: Family Medicine

## 2017-03-27 DIAGNOSIS — L821 Other seborrheic keratosis: Secondary | ICD-10-CM | POA: Diagnosis not present

## 2017-04-13 NOTE — Addendum Note (Signed)
Addendum  created 04/13/17 1331 by Rica Koyanagi, MD   Sign clinical note

## 2017-04-24 ENCOUNTER — Telehealth: Payer: Self-pay | Admitting: Family Medicine

## 2017-04-24 NOTE — Telephone Encounter (Signed)
Closing encounter

## 2017-04-25 ENCOUNTER — Encounter: Payer: Self-pay | Admitting: Family Medicine

## 2017-04-25 ENCOUNTER — Ambulatory Visit: Payer: PRIVATE HEALTH INSURANCE | Admitting: Family Medicine

## 2017-04-25 ENCOUNTER — Ambulatory Visit (INDEPENDENT_AMBULATORY_CARE_PROVIDER_SITE_OTHER): Payer: Medicare Other | Admitting: Family Medicine

## 2017-04-25 VITALS — BP 130/76 | HR 70 | Temp 98.1°F | Resp 14 | Ht 69.0 in | Wt 181.0 lb

## 2017-04-25 DIAGNOSIS — R194 Change in bowel habit: Secondary | ICD-10-CM | POA: Diagnosis not present

## 2017-04-25 DIAGNOSIS — K5900 Constipation, unspecified: Secondary | ICD-10-CM | POA: Diagnosis not present

## 2017-04-25 NOTE — Patient Instructions (Addendum)
Get the xray when you can  Try the Linzess once a day  F/U as previous

## 2017-04-25 NOTE — Progress Notes (Signed)
   Subjective:    Patient ID: Samuel Flores, male    DOB: 1941-07-13, 76 y.o.   MRN: [id number]  Patient presents for GI Issues (x months- states that he has frequent diarrhea)    For unknown period of time, gets growling in stomach, then has a mucous bm, has solid stool in the morning, but then throughout the day, gets sensation that he needs to go and sometimes he has small mucous stool and mild diarrhea, other times no stool at all.  No particular foods that make it worst, drinks ensure daily. Weight has been steady 180-184 over the past 6 months No blood in stool   Review Of Systems:  GEN- denies fatigue, fever, weight loss,weakness, recent illness HEENT- denies eye drainage, change in vision, nasal discharge, CVS- denies chest pain, palpitations RESP- denies SOB, cough, wheeze ABD- denies N/V, change in stools, abd pain GU- denies dysuria, hematuria, dribbling, incontinence MSK- denies joint pain, muscle aches, injury Neuro- denies headache, dizziness, syncope, seizure activity       Objective:    BP 130/76   Pulse 70   Temp 98.1 F (36.7 C) (Oral)   Resp 14   Ht 5\' 9"  (1.753 m)   Wt 181 lb (82.1 kg)   SpO2 99%   BMI 26.73 kg/m  GEN- NAD, alert and oriented x3 HEENT- PERRL, EOMI, non injected sclera, pink conjunctiva, MMM, oropharynx clear CVS- RRR, no murmur RESP-CTAB ABD-hyperpactive BS all 4 qaudrants ,soft,mild bloating,ND EXT- No edema Pulses- Radial  2+        Assessment & Plan:      Problem List Items Addressed This Visit    None    Visit Diagnoses    Constipation, unspecified constipation type    -  Primary   concern he may not be completely evacuating bowels, also has poor diet. with bloating some IBS symptoms. Wanted to obtain Abdomen Xray, he states he will try to get xray  Does not sound like bowel obstruction as he is moving some things through Given linzess 72mg  daily to try    Relevant Orders   DG Abd 1 View   Bowel habit changes        Relevant Orders   DG Abd 1 View      Note: This dictation was prepared with Dragon dictation along with smaller phrase technology. Any transcriptional errors that result from this process are unintentional.

## 2017-05-02 NOTE — Telephone Encounter (Signed)
Received approval for patient assistance for Eliquis from [place] x1 year.

## 2017-05-23 ENCOUNTER — Other Ambulatory Visit: Payer: Self-pay | Admitting: *Deleted

## 2017-05-23 MED ORDER — ALLOPURINOL 100 MG PO TABS: 200.0000 mg | ORAL_TABLET | Freq: Every day | ORAL | 2 refills | Status: DC

## 2017-09-03 ENCOUNTER — Other Ambulatory Visit: Payer: Self-pay | Admitting: Family Medicine

## 2017-09-06 DIAGNOSIS — I4891 Unspecified atrial fibrillation: Secondary | ICD-10-CM | POA: Diagnosis not present

## 2017-09-06 DIAGNOSIS — I2510 Atherosclerotic heart disease of native coronary artery without angina pectoris: Secondary | ICD-10-CM | POA: Diagnosis not present

## 2017-09-06 DIAGNOSIS — K700 Alcoholic fatty liver: Secondary | ICD-10-CM | POA: Diagnosis not present

## 2017-09-06 DIAGNOSIS — Z8679 Personal history of other diseases of the circulatory system: Secondary | ICD-10-CM | POA: Diagnosis not present

## 2017-09-11 ENCOUNTER — Ambulatory Visit (INDEPENDENT_AMBULATORY_CARE_PROVIDER_SITE_OTHER): Payer: Medicare Other

## 2017-09-11 DIAGNOSIS — Z23 Encounter for immunization: Secondary | ICD-10-CM | POA: Diagnosis not present

## 2017-09-11 NOTE — Progress Notes (Signed)
Patient was seen in office for flu vaccine.patient received vaccine in left deltoid.Patietn tolerated well

## 2017-09-26 ENCOUNTER — Emergency Department (HOSPITAL_COMMUNITY): Payer: Medicare Other

## 2017-09-26 ENCOUNTER — Inpatient Hospital Stay (HOSPITAL_COMMUNITY)
Admission: EM | Admit: 2017-09-26 | Discharge: 2017-10-06 | DRG: 871 | Disposition: A | Discharge status: Home Health | Payer: Medicare Other | Attending: Family Medicine | Admitting: Family Medicine

## 2017-09-26 DIAGNOSIS — R6520 Severe sepsis without septic shock: Secondary | ICD-10-CM | POA: Diagnosis present

## 2017-09-26 DIAGNOSIS — R9431 Abnormal electrocardiogram [ECG] [EKG]: Secondary | ICD-10-CM | POA: Diagnosis not present

## 2017-09-26 DIAGNOSIS — R4182 Altered mental status, unspecified: Secondary | ICD-10-CM | POA: Diagnosis not present

## 2017-09-26 DIAGNOSIS — R945 Abnormal results of liver function studies: Secondary | ICD-10-CM

## 2017-09-26 DIAGNOSIS — R0602 Shortness of breath: Secondary | ICD-10-CM | POA: Diagnosis not present

## 2017-09-26 DIAGNOSIS — A419 Sepsis, unspecified organism: Secondary | ICD-10-CM | POA: Diagnosis present

## 2017-09-26 DIAGNOSIS — G9341 Metabolic encephalopathy: Secondary | ICD-10-CM | POA: Diagnosis not present

## 2017-09-26 DIAGNOSIS — K8309 Other cholangitis: Secondary | ICD-10-CM

## 2017-09-26 DIAGNOSIS — I4891 Unspecified atrial fibrillation: Secondary | ICD-10-CM | POA: Diagnosis present

## 2017-09-26 DIAGNOSIS — E876 Hypokalemia: Secondary | ICD-10-CM | POA: Diagnosis present

## 2017-09-26 DIAGNOSIS — R7881 Bacteremia: Secondary | ICD-10-CM | POA: Diagnosis not present

## 2017-09-26 DIAGNOSIS — M1A09X0 Idiopathic chronic gout, multiple sites, without tophus (tophi): Secondary | ICD-10-CM | POA: Diagnosis not present

## 2017-09-26 DIAGNOSIS — R0600 Dyspnea, unspecified: Secondary | ICD-10-CM

## 2017-09-26 DIAGNOSIS — D7589 Other specified diseases of blood and blood-forming organs: Secondary | ICD-10-CM | POA: Diagnosis present

## 2017-09-26 DIAGNOSIS — I482 Chronic atrial fibrillation: Secondary | ICD-10-CM | POA: Diagnosis present

## 2017-09-26 DIAGNOSIS — I700 Atherosclerosis of aorta: Secondary | ICD-10-CM | POA: Diagnosis not present

## 2017-09-26 DIAGNOSIS — I110 Hypertensive heart disease with heart failure: Secondary | ICD-10-CM | POA: Diagnosis present

## 2017-09-26 DIAGNOSIS — K7010 Alcoholic hepatitis without ascites: Secondary | ICD-10-CM | POA: Diagnosis present

## 2017-09-26 DIAGNOSIS — E872 Acidosis, unspecified: Secondary | ICD-10-CM | POA: Diagnosis present

## 2017-09-26 DIAGNOSIS — A4159 Other Gram-negative sepsis: Secondary | ICD-10-CM | POA: Diagnosis not present

## 2017-09-26 DIAGNOSIS — Z7982 Long term (current) use of aspirin: Secondary | ICD-10-CM

## 2017-09-26 DIAGNOSIS — I509 Heart failure, unspecified: Secondary | ICD-10-CM | POA: Diagnosis not present

## 2017-09-26 DIAGNOSIS — J150 Pneumonia due to Klebsiella pneumoniae: Secondary | ICD-10-CM | POA: Diagnosis present

## 2017-09-26 DIAGNOSIS — K760 Fatty (change of) liver, not elsewhere classified: Secondary | ICD-10-CM | POA: Diagnosis present

## 2017-09-26 DIAGNOSIS — M109 Gout, unspecified: Secondary | ICD-10-CM | POA: Diagnosis present

## 2017-09-26 DIAGNOSIS — N170 Acute kidney failure with tubular necrosis: Secondary | ICD-10-CM | POA: Diagnosis present

## 2017-09-26 DIAGNOSIS — K7200 Acute and subacute hepatic failure without coma: Secondary | ICD-10-CM | POA: Diagnosis present

## 2017-09-26 DIAGNOSIS — Z7901 Long term (current) use of anticoagulants: Secondary | ICD-10-CM | POA: Diagnosis not present

## 2017-09-26 DIAGNOSIS — Z8673 Personal history of transient ischemic attack (TIA), and cerebral infarction without residual deficits: Secondary | ICD-10-CM

## 2017-09-26 DIAGNOSIS — I5030 Unspecified diastolic (congestive) heart failure: Secondary | ICD-10-CM | POA: Diagnosis present

## 2017-09-26 DIAGNOSIS — I2510 Atherosclerotic heart disease of native coronary artery without angina pectoris: Secondary | ICD-10-CM | POA: Diagnosis present

## 2017-09-26 DIAGNOSIS — T380X5A Adverse effect of glucocorticoids and synthetic analogues, initial encounter: Secondary | ICD-10-CM | POA: Diagnosis not present

## 2017-09-26 DIAGNOSIS — R079 Chest pain, unspecified: Secondary | ICD-10-CM | POA: Diagnosis not present

## 2017-09-26 DIAGNOSIS — Z87891 Personal history of nicotine dependence: Secondary | ICD-10-CM

## 2017-09-26 DIAGNOSIS — R1013 Epigastric pain: Secondary | ICD-10-CM | POA: Diagnosis present

## 2017-09-26 DIAGNOSIS — M1990 Unspecified osteoarthritis, unspecified site: Secondary | ICD-10-CM | POA: Diagnosis present

## 2017-09-26 DIAGNOSIS — R1011 Right upper quadrant pain: Secondary | ICD-10-CM

## 2017-09-26 DIAGNOSIS — I5033 Acute on chronic diastolic (congestive) heart failure: Secondary | ICD-10-CM | POA: Diagnosis present

## 2017-09-26 DIAGNOSIS — D7582 Heparin induced thrombocytopenia (HIT): Secondary | ICD-10-CM | POA: Diagnosis present

## 2017-09-26 DIAGNOSIS — L89159 Pressure ulcer of sacral region, unspecified stage: Secondary | ICD-10-CM | POA: Diagnosis present

## 2017-09-26 DIAGNOSIS — R4020 Unspecified coma: Secondary | ICD-10-CM | POA: Diagnosis not present

## 2017-09-26 DIAGNOSIS — F1010 Alcohol abuse, uncomplicated: Secondary | ICD-10-CM | POA: Diagnosis present

## 2017-09-26 DIAGNOSIS — L8990 Pressure ulcer of unspecified site, unspecified stage: Secondary | ICD-10-CM

## 2017-09-26 DIAGNOSIS — R072 Precordial pain: Secondary | ICD-10-CM | POA: Diagnosis not present

## 2017-09-26 DIAGNOSIS — E7800 Pure hypercholesterolemia, unspecified: Secondary | ICD-10-CM | POA: Diagnosis present

## 2017-09-26 DIAGNOSIS — R609 Edema, unspecified: Secondary | ICD-10-CM | POA: Diagnosis not present

## 2017-09-26 DIAGNOSIS — D696 Thrombocytopenia, unspecified: Secondary | ICD-10-CM | POA: Diagnosis not present

## 2017-09-26 DIAGNOSIS — R7989 Other specified abnormal findings of blood chemistry: Secondary | ICD-10-CM | POA: Diagnosis present

## 2017-09-26 LAB — BLOOD CULTURE ID PANEL (REFLEXED)
Acinetobacter baumannii: NOT DETECTED
CANDIDA GLABRATA: NOT DETECTED
Candida albicans: NOT DETECTED
Candida krusei: NOT DETECTED
Candida parapsilosis: NOT DETECTED
Candida tropicalis: NOT DETECTED
Carbapenem resistance: NOT DETECTED
ENTEROBACTERIACEAE SPECIES: DETECTED — AB
ENTEROCOCCUS SPECIES: NOT DETECTED
ESCHERICHIA COLI: NOT DETECTED
Enterobacter cloacae complex: NOT DETECTED
Haemophilus influenzae: NOT DETECTED
Klebsiella oxytoca: NOT DETECTED
Klebsiella pneumoniae: DETECTED — AB
LISTERIA MONOCYTOGENES: NOT DETECTED
NEISSERIA MENINGITIDIS: NOT DETECTED
Proteus species: NOT DETECTED
Pseudomonas aeruginosa: NOT DETECTED
SERRATIA MARCESCENS: NOT DETECTED
STAPHYLOCOCCUS AUREUS BCID: NOT DETECTED
STAPHYLOCOCCUS SPECIES: NOT DETECTED
STREPTOCOCCUS AGALACTIAE: NOT DETECTED
STREPTOCOCCUS PNEUMONIAE: NOT DETECTED
Streptococcus pyogenes: NOT DETECTED
Streptococcus species: NOT DETECTED

## 2017-09-26 LAB — CBC WITH DIFFERENTIAL/PLATELET
Basophils Absolute: 0 10*3/uL (ref 0.0–0.1)
Basophils Relative: 0 %
EOS ABS: 0 10*3/uL (ref 0.0–0.7)
Eosinophils Relative: 0 %
HEMATOCRIT: 41.6 % (ref 39.0–52.0)
Hemoglobin: 14.4 g/dL (ref 13.0–17.0)
LYMPHS ABS: 1.2 10*3/uL (ref 0.7–4.0)
LYMPHS PCT: 8 %
MCH: 35.2 pg — AB (ref 26.0–34.0)
MCHC: 34.6 g/dL (ref 30.0–36.0)
MCV: 101.7 fL — AB (ref 78.0–100.0)
MONOS PCT: 6 %
Monocytes Absolute: 0.9 10*3/uL (ref 0.1–1.0)
Neutro Abs: 12.3 10*3/uL — ABNORMAL HIGH (ref 1.7–7.7)
Neutrophils Relative %: 86 %
Platelets: 133 10*3/uL — ABNORMAL LOW (ref 150–400)
RBC: 4.09 MIL/uL — AB (ref 4.22–5.81)
RDW: 12.8 % (ref 11.5–15.5)
WBC: 14.5 10*3/uL — AB (ref 4.0–10.5)

## 2017-09-26 LAB — I-STAT CG4 LACTIC ACID, ED
LACTIC ACID, VENOUS: 3.97 mmol/L — AB (ref 0.5–1.9)
Lactic Acid, Venous: 4.3 mmol/L (ref 0.5–1.9)

## 2017-09-26 LAB — I-STAT CHEM 8, ED
BUN: 23 mg/dL — AB (ref 6–20)
CALCIUM ION: 0.98 mmol/L — AB (ref 1.15–1.40)
CHLORIDE: 101 mmol/L (ref 101–111)
Creatinine, Ser: 1.3 mg/dL — ABNORMAL HIGH (ref 0.61–1.24)
Glucose, Bld: 136 mg/dL — ABNORMAL HIGH (ref 65–99)
HCT: 42 % (ref 39.0–52.0)
Hemoglobin: 14.3 g/dL (ref 13.0–17.0)
POTASSIUM: 4.5 mmol/L (ref 3.5–5.1)
SODIUM: 139 mmol/L (ref 135–145)
TCO2: 24 mmol/L (ref 22–32)

## 2017-09-26 LAB — COMPREHENSIVE METABOLIC PANEL
ALBUMIN: 3 g/dL — AB (ref 3.5–5.0)
ALK PHOS: 175 U/L — AB (ref 38–126)
ALT: 80 U/L — AB (ref 17–63)
AST: 192 U/L — AB (ref 15–41)
Anion gap: 11 (ref 5–15)
BILIRUBIN TOTAL: 2.7 mg/dL — AB (ref 0.3–1.2)
BUN: 19 mg/dL (ref 6–20)
CALCIUM: 8 mg/dL — AB (ref 8.9–10.3)
CO2: 23 mmol/L (ref 22–32)
CREATININE: 1.13 mg/dL (ref 0.61–1.24)
Chloride: 103 mmol/L (ref 101–111)
GFR calc Af Amer: >60 mL/min (ref >60)
GFR calc non Af Amer: >60 mL/min (ref >60)
GLUCOSE: 136 mg/dL — AB (ref 65–99)
Potassium: 4.5 mmol/L (ref 3.5–5.1)
Sodium: 137 mmol/L (ref 135–145)
TOTAL PROTEIN: 5.8 g/dL — AB (ref 6.5–8.1)

## 2017-09-26 LAB — URINALYSIS, ROUTINE W REFLEX MICROSCOPIC
Glucose, UA: NEGATIVE mg/dL
Hgb urine dipstick: NEGATIVE
Ketones, ur: 5 mg/dL — AB
LEUKOCYTES UA: NEGATIVE
NITRITE: NEGATIVE
Protein, ur: NEGATIVE mg/dL
pH: 5 (ref 5.0–8.0)

## 2017-09-26 LAB — LACTIC ACID, PLASMA
LACTIC ACID, VENOUS: 3.5 mmol/L — AB (ref 0.5–1.9)
Lactic Acid, Venous: 3 mmol/L (ref 0.5–1.9)

## 2017-09-26 LAB — PROCALCITONIN: Procalcitonin: 0.59 ng/mL

## 2017-09-26 LAB — PROTIME-INR
INR: 1.26
Prothrombin Time: 15.7 seconds — ABNORMAL HIGH (ref 11.4–15.2)

## 2017-09-26 LAB — HEPARIN LEVEL (UNFRACTIONATED): Heparin Unfractionated: >2.2 IU/mL — ABNORMAL HIGH (ref 0.30–0.70)

## 2017-09-26 LAB — TROPONIN I: Troponin I: <0.03 ng/mL (ref <0.03)

## 2017-09-26 LAB — APTT: aPTT: >200 seconds (ref 24–36)

## 2017-09-26 LAB — I-STAT TROPONIN, ED: Troponin i, poc: 0 ng/mL (ref 0.00–0.08)

## 2017-09-26 LAB — LIPASE, BLOOD: Lipase: 41 U/L (ref 11–51)

## 2017-09-26 MED ORDER — DEXTROSE 5 % IV SOLN
2.0000 g | INTRAVENOUS | Status: DC
  Administered 2017-09-26 – 2017-10-02 (×7): 2 g via INTRAVENOUS
  Filled 2017-09-26 (×8): qty 2

## 2017-09-26 MED ORDER — LORAZEPAM 1 MG PO TABS: 1.0000 mg | ORAL_TABLET | Freq: Four times a day (QID) | ORAL | Status: DC | PRN

## 2017-09-26 MED ORDER — ACETAMINOPHEN 500 MG PO TABS: 500.0000 mg | ORAL_TABLET | Freq: Four times a day (QID) | ORAL | Status: DC | PRN

## 2017-09-26 MED ORDER — PIPERACILLIN-TAZOBACTAM 3.375 G IVPB
3.3750 g | Freq: Three times a day (TID) | INTRAVENOUS | Status: DC
  Administered 2017-09-26 (×2): 3.375 g via INTRAVENOUS
  Filled 2017-09-26 (×3): qty 50

## 2017-09-26 MED ORDER — FOLIC ACID 1 MG PO TABS
1.0000 mg | ORAL_TABLET | Freq: Every day | ORAL | Status: DC
  Administered 2017-09-26 – 2017-10-06 (×10): 1 mg via ORAL
  Filled 2017-09-26 (×10): qty 1

## 2017-09-26 MED ORDER — GI COCKTAIL ~~LOC~~: 30.0000 mL | Freq: Three times a day (TID) | ORAL | Status: DC | PRN

## 2017-09-26 MED ORDER — VITAMIN B-1 100 MG PO TABS
100.0000 mg | ORAL_TABLET | Freq: Every day | ORAL | Status: DC
  Administered 2017-09-26 – 2017-10-06 (×10): 100 mg via ORAL
  Filled 2017-09-26 (×10): qty 1

## 2017-09-26 MED ORDER — PIPERACILLIN-TAZOBACTAM 3.375 G IVPB 30 MIN
3.3750 g | Freq: Once | INTRAVENOUS | Status: AC
  Administered 2017-09-26: 3.375 g via INTRAVENOUS
  Filled 2017-09-26: qty 50

## 2017-09-26 MED ORDER — ONDANSETRON HCL 4 MG/2ML IJ SOLN: 4.0000 mg | Freq: Four times a day (QID) | INTRAMUSCULAR | Status: DC | PRN

## 2017-09-26 MED ORDER — METOPROLOL TARTRATE 5 MG/5ML IV SOLN
5.0000 mg | Freq: Four times a day (QID) | INTRAVENOUS | Status: DC
  Administered 2017-09-26 – 2017-09-27 (×3): 5 mg via INTRAVENOUS
  Filled 2017-09-26 (×3): qty 5

## 2017-09-26 MED ORDER — ATORVASTATIN CALCIUM 10 MG PO TABS
10.0000 mg | ORAL_TABLET | Freq: Every day | ORAL | Status: DC
Start: 2017-09-26 — End: 2017-09-26
  Filled 2017-09-26: qty 1

## 2017-09-26 MED ORDER — THIAMINE HCL 100 MG/ML IJ SOLN: 100.0000 mg | Freq: Every day | INTRAMUSCULAR | Status: DC

## 2017-09-26 MED ORDER — LORAZEPAM 2 MG/ML IJ SOLN
1.0000 mg | Freq: Four times a day (QID) | INTRAMUSCULAR | Status: DC | PRN
Start: 2017-09-26 — End: 2017-09-26

## 2017-09-26 MED ORDER — ACETAMINOPHEN 325 MG PO TABS: 650.0000 mg | ORAL_TABLET | Freq: Four times a day (QID) | ORAL | Status: DC | PRN

## 2017-09-26 MED ORDER — METOPROLOL TARTRATE 25 MG PO TABS: 100.0000 mg | ORAL_TABLET | Freq: Two times a day (BID) | ORAL | Status: DC

## 2017-09-26 MED ORDER — LORAZEPAM 2 MG/ML IJ SOLN
2.0000 mg | INTRAMUSCULAR | Status: DC | PRN
  Administered 2017-09-27 – 2017-09-28 (×6): 2 mg via INTRAVENOUS
  Filled 2017-09-26 (×8): qty 1

## 2017-09-26 MED ORDER — ACETAMINOPHEN 650 MG RE SUPP: 650.0000 mg | Freq: Four times a day (QID) | RECTAL | Status: DC | PRN

## 2017-09-26 MED ORDER — SODIUM CHLORIDE 0.9 % IV SOLN
INTRAVENOUS | Status: DC
  Administered 2017-09-26: 1000 mL via INTRAVENOUS
  Administered 2017-09-26: 05:00:00 via INTRAVENOUS

## 2017-09-26 MED ORDER — ADULT MULTIVITAMIN W/MINERALS CH
1.0000 | ORAL_TABLET | Freq: Every day | ORAL | Status: DC
  Administered 2017-09-26 – 2017-10-06 (×10): 1 via ORAL
  Filled 2017-09-26 (×10): qty 1

## 2017-09-26 MED ORDER — PANTOPRAZOLE SODIUM 40 MG IV SOLR
40.0000 mg | Freq: Once | INTRAVENOUS | Status: AC
  Administered 2017-09-26: 40 mg via INTRAVENOUS
  Filled 2017-09-26: qty 40

## 2017-09-26 MED ORDER — SODIUM CHLORIDE 0.9 % IV BOLUS (SEPSIS)
1000.0000 mL | Freq: Once | INTRAVENOUS | Status: AC
Start: 2017-09-26 — End: 2017-09-26
  Administered 2017-09-26: 1000 mL via INTRAVENOUS

## 2017-09-26 MED ORDER — PROMETHAZINE HCL 25 MG/ML IJ SOLN
12.5000 mg | Freq: Once | INTRAMUSCULAR | Status: AC
  Administered 2017-09-26: 12.5 mg via INTRAVENOUS
  Filled 2017-09-26: qty 1

## 2017-09-26 MED ORDER — SODIUM CHLORIDE 0.9 % IV BOLUS (SEPSIS)
1000.0000 mL | Freq: Once | INTRAVENOUS | Status: AC
  Administered 2017-09-26: 1000 mL via INTRAVENOUS

## 2017-09-26 MED ORDER — DILTIAZEM HCL ER 180 MG PO CP24: 180.0000 mg | ORAL_CAPSULE | Freq: Every day | ORAL | Status: DC

## 2017-09-26 MED ORDER — FENTANYL CITRATE (PF) 100 MCG/2ML IJ SOLN
25.0000 ug | INTRAMUSCULAR | Status: DC | PRN
  Administered 2017-09-28: 25 ug via INTRAVENOUS
  Filled 2017-09-26: qty 2

## 2017-09-26 MED ORDER — FENTANYL CITRATE (PF) 100 MCG/2ML IJ SOLN
25.0000 ug | Freq: Once | INTRAMUSCULAR | Status: AC
  Administered 2017-09-26: 25 ug via INTRAVENOUS
  Filled 2017-09-26: qty 2

## 2017-09-26 MED ORDER — HEPARIN (PORCINE) IN NACL 100-0.45 UNIT/ML-% IJ SOLN
900.0000 [IU]/h | INTRAMUSCULAR | Status: DC
  Administered 2017-09-26 – 2017-09-27 (×2): 900 [IU]/h via INTRAVENOUS
  Filled 2017-09-26: qty 250

## 2017-09-26 MED ORDER — PANTOPRAZOLE SODIUM 40 MG IV SOLR
40.0000 mg | INTRAVENOUS | Status: DC
  Administered 2017-09-26 – 2017-09-27 (×2): 40 mg via INTRAVENOUS
  Filled 2017-09-26 (×2): qty 40

## 2017-09-26 MED ORDER — ONDANSETRON HCL 4 MG/2ML IJ SOLN
4.0000 mg | Freq: Once | INTRAMUSCULAR | Status: AC
  Administered 2017-09-26: 4 mg via INTRAVENOUS
  Filled 2017-09-26: qty 2

## 2017-09-26 MED ORDER — ONDANSETRON HCL 4 MG PO TABS: 4.0000 mg | ORAL_TABLET | Freq: Four times a day (QID) | ORAL | Status: DC | PRN

## 2017-09-26 MED ORDER — METOPROLOL TARTRATE 5 MG/5ML IV SOLN
5.0000 mg | Freq: Once | INTRAVENOUS | Status: AC
  Administered 2017-09-26: 5 mg via INTRAVENOUS
  Filled 2017-09-26: qty 5

## 2017-09-26 MED ORDER — IOPAMIDOL (ISOVUE-370) INJECTION 76%
INTRAVENOUS | Status: AC
  Administered 2017-09-26: 100 mL
  Filled 2017-09-26: qty 100

## 2017-09-26 MED ORDER — HEPARIN (PORCINE) IN NACL 100-0.45 UNIT/ML-% IJ SOLN
1150.0000 [IU]/h | INTRAMUSCULAR | Status: DC
  Administered 2017-09-26: 1150 [IU]/h via INTRAVENOUS
  Filled 2017-09-26: qty 250

## 2017-09-26 MED ORDER — ALLOPURINOL 100 MG PO TABS
200.0000 mg | ORAL_TABLET | Freq: Every day | ORAL | Status: DC
  Administered 2017-09-27 – 2017-10-06 (×9): 200 mg via ORAL
  Filled 2017-09-26 (×10): qty 2

## 2017-09-26 MED ORDER — SODIUM CHLORIDE 0.9 % IV BOLUS (SEPSIS)
250.0000 mL | Freq: Once | INTRAVENOUS | Status: AC
  Administered 2017-09-26: 250 mL via INTRAVENOUS

## 2017-09-26 MED ORDER — ASPIRIN EC 81 MG PO TBEC
81.0000 mg | DELAYED_RELEASE_TABLET | Freq: Every day | ORAL | Status: DC
  Administered 2017-09-26 – 2017-10-03 (×7): 81 mg via ORAL
  Filled 2017-09-26 (×7): qty 1

## 2017-09-26 MED ORDER — DILTIAZEM HCL 100 MG IV SOLR
5.0000 mg/h | INTRAVENOUS | Status: AC
  Administered 2017-09-26: 5 mg/h via INTRAVENOUS
  Administered 2017-09-27: 15 mg/h via INTRAVENOUS
  Administered 2017-09-28 (×2): 5 mg/h via INTRAVENOUS
  Filled 2017-09-26 (×9): qty 100

## 2017-09-26 MED ORDER — SODIUM CHLORIDE 0.9 % IV BOLUS (SEPSIS)
500.0000 mL | Freq: Once | INTRAVENOUS | Status: AC
  Administered 2017-09-26: 500 mL via INTRAVENOUS

## 2017-09-26 NOTE — ED Notes (Signed)
Patient transported to Ultrasound 

## 2017-09-26 NOTE — Progress Notes (Signed)
Upon assessment, patient's heart rate 120-150s, in A-fib, at rest.  Patient states that he feels a fluttering sometimes but nothing abnormal or new for him.  MD paged, orders obtained for one time dose of 5mg  metoprolol IV.  On re-assessment, no change noticed in patient's status. MD paged, Cardizem gtt ordered.

## 2017-09-26 NOTE — ED Notes (Signed)
Lab called results of lactic acid-- will notify MD

## 2017-09-26 NOTE — H&P (Signed)
History and Physical    Samuel Flores MHD:[id number] DOB: [birth year] DOA: 09/26/2017  PCP: Alycia Rossetti, MD  Patient coming from: Home  I have personally briefly reviewed patient's old medical records in [place]  Chief Complaint: Epigastric and RUQ abd pain  HPI: Samuel Flores is a 75 y.o. male with medical history significant of EtOH use daily, CAD, gout, A.Fib rate controlled on eliquis.  Patient presents to the ED with c/o central chest and epigastric pain, symptoms onset while resting at about 7:30pm.  Pale on EMS arrival.  ASA and NTG given and SBP dropped to 90s.  Ongoing chest, epigastric pain, as well as nausea.   ED Course: Trop negative, EKG shows no obvious STEMI.  CTA demonstrates no dissection nor PE.  Does show mild inflamation about CBD and gallbladder though.  RUQ [place] is unremarkable.  Patient does have significant epigastric and RUQ tenderness.  Lipase neg.  AST 192, ALT 80, Bili 2.7.  Does appear to have chronic LFT elevations to a slightly lesser degree.   Review of Systems: As per HPI otherwise 10 point review of systems negative.   Past Medical History:  Diagnosis Date  . Alcohol abuse, daily use 08/18/2013  . Arthritis    osteoarthritis  . CAD (coronary artery disease)    Non obstructive  . Cancer [facility]) 1994   prostate  . Diastolic dysfunction Echo 2012   Grade 1  . Gout   . 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 Hx-TIA (transient ischemic attack) 2011  . Hypercholesteremia   . Hypertension     Past Surgical History:  Procedure Laterality Date  . BURR HOLE FOR SUBDURAL HEMATOMA  03/2011  . EYE SURGERY Left 03/2009   cataract  . PROSTATE SURGERY  1994  . SPINE SURGERY     cervical     reports that he quit smoking about 4 years ago. His smoking use included pipe. He quit after 39.00 years of use. he has never used smokeless tobacco. He reports that he drinks about 33.6 oz of alcohol per week. He reports that he does not use drugs.  No  Known Allergies  Family History  Problem Relation Age of Onset  . Lung cancer Mother        never smoker  . Prostate cancer Father      Prior to Admission medications   Medication Sig Start Date End Date Taking? Authorizing Provider  allopurinol (ZYLOPRIM) 100 MG tablet Take 2 tablets (200 mg total) by mouth daily. 05/23/17  Yes [place], Modena Nunnery, MD  apixaban (ELIQUIS) 5 MG TABS tablet Take 1 tablet (5 mg total) by mouth 2 (two) times daily. 03/01/17  Yes [place], Modena Nunnery, MD  aspirin EC 81 MG tablet Take 81 mg by mouth daily.   Yes [provider]  atorvastatin (LIPITOR) 20 MG tablet TAKE 1/2 TABLET BY MOUTH DAILY 09/03/17  Yes [place], Modena Nunnery, MD  diltiazem (DILACOR XR) 180 MG 24 hr capsule Take 180 mg daily by mouth.   Yes [provider]  enalapril (VASOTEC) 20 MG tablet TAKE 1 TABLETS BY MOUTH DAILY 12/01/16  Yes Dena Billet B, PA-C  metoprolol tartrate (LOPRESSOR) 100 MG tablet Take 100 mg 2 (two) times daily by mouth.   Yes [provider]  spironolactone (ALDACTONE) 25 MG tablet Take 1 tablet (25 mg total) by mouth daily. Patient taking differently: Take 12.5 mg by mouth daily.  07/27/14  Yes [place], Modena Nunnery, MD  Physical Exam: Vitals:   09/26/17 0330 09/26/17 0345 09/26/17 0400 09/26/17 0445  BP: (!) 113/59 110/65  127/68  Pulse: (!) 101 100  (!) 110  Resp: (!) 26 (!) 29  (!) 30  Temp:    98.3 F (36.8 C)  TempSrc:      SpO2: 92% 94%  95%  Weight:   81.6 kg (180 lb)     Constitutional: Distressed, tachypnic Eyes: PERRL, lids and conjunctivae normal ENMT: Mucous membranes are moist. Posterior pharynx clear of any exudate or lesions.Normal dentition.  Neck: normal, supple, no masses, no thyromegaly Respiratory: clear to auscultation bilaterally, Tachypnic Cardiovascular: Tachycardic, irregularly irregular Abdomen: Epigastric and RUQ TTP, no rebound, does have guarding there. Musculoskeletal: no clubbing / cyanosis. No joint deformity  upper and lower extremities. Good ROM, no contractures. Normal muscle tone.  Skin: no rashes, lesions, ulcers. No induration Neurologic: CN 2-12 grossly intact. Sensation intact, DTR normal. Strength 5/5 in all 4.  Psychiatric: Normal judgment and insight. Alert and oriented x 3. Normal mood.    Labs on Admission: I have personally reviewed following labs and imaging studies  CBC: Recent Labs  Lab 09/26/17 0142 09/26/17 0151  WBC 14.5*  --   NEUTROABS 12.3*  --   HGB 14.4 14.3  HCT 41.6 42.0  MCV 101.7*  --   PLT 133*  --    Basic Metabolic Panel: Recent Labs  Lab 09/26/17 0142 09/26/17 0151  NA 137 139  K 4.5 4.5  CL 103 101  CO2 23  --   GLUCOSE 136* 136*  BUN 19 23*  CREATININE 1.13 1.30*  CALCIUM 8.0*  --    GFR: Estimated Creatinine Clearance: 46.8 mL/min (A) (by C-G formula based on SCr of 1.3 mg/dL (H)). Liver Function Tests: Recent Labs  Lab 09/26/17 0142  AST 192*  ALT 80*  ALKPHOS 175*  BILITOT 2.7*  PROT 5.8*  ALBUMIN 3.0*   Recent Labs  Lab 09/26/17 0142  LIPASE 41   No results for input(s): AMMONIA in the last 168 hours. Coagulation Profile: Recent Labs  Lab 09/26/17 0142  INR 1.26   Cardiac Enzymes: Recent Labs  Lab 09/26/17 0142  TROPONINI <0.03   BNP (last 3 results) No results for input(s): PROBNP in the last 8760 hours. HbA1C: No results for input(s): HGBA1C in the last 72 hours. CBG: No results for input(s): GLUCAP in the last 168 hours. Lipid Profile: No results for input(s): CHOL, HDL, LDLCALC, TRIG, CHOLHDL, LDLDIRECT in the last 72 hours. Thyroid Function Tests: No results for input(s): TSH, T4TOTAL, FREET4, T3FREE, THYROIDAB in the last 72 hours. Anemia Panel: No results for input(s): VITAMINB12, FOLATE, FERRITIN, TIBC, IRON, RETICCTPCT in the last 72 hours. Urine analysis:    Component Value Date/Time   COLORURINE YELLOW 01/01/2013 1626   APPEARANCEUR CLEAR 01/01/2013 1626   LABSPEC 1.014 01/01/2013 1626    PHURINE 7.0 01/01/2013 1626   GLUCOSEU NEGATIVE 01/01/2013 1626   HGBUR NEGATIVE 01/01/2013 1626   BILIRUBINUR NEGATIVE 01/01/2013 1626   KETONESUR NEGATIVE 01/01/2013 1626   PROTEINUR NEGATIVE 01/01/2013 1626   UROBILINOGEN 1.0 01/01/2013 1626   NITRITE NEGATIVE 01/01/2013 1626   LEUKOCYTESUR NEGATIVE 01/01/2013 1626    Radiological Exams on Admission: Dg Chest Portable 1 View  Result Date: 09/26/2017 CLINICAL DATA:  Acute onset of substernal chest pain.  Nausea. EXAM: PORTABLE CHEST 1 VIEW COMPARISON:  Chest radiograph performed 06/02/2013 FINDINGS: The lungs are mildly hypoexpanded. Mild left basilar airspace opacity likely reflects atelectasis. No pleural  effusion or pneumothorax is seen. The cardiomediastinal silhouette is borderline normal in size. No acute osseous abnormalities are identified. Cervical spinal fusion hardware is noted. IMPRESSION: Lungs mildly hypoexpanded. Mild left basilar airspace opacity likely reflects atelectasis. Electronically Signed   By: Garald Balding M.D.   On: 09/26/2017 02:22   Ct Angio Chest/abd/pel For Dissection W And/or Wo Contrast  Result Date: 09/26/2017 CLINICAL DATA:  Acute onset of substernal chest pain. Nausea and left-sided abdominal pain. EXAM: CT ANGIOGRAPHY CHEST, ABDOMEN AND PELVIS TECHNIQUE: Multidetector CT imaging through the chest, abdomen and pelvis was performed using the standard protocol during bolus administration of intravenous contrast. Multiplanar reconstructed images and MIPs were obtained and reviewed to evaluate the vascular anatomy. CONTRAST:  170mL ISOVUE-370 IOPAMIDOL (ISOVUE-370) INJECTION 76% COMPARISON:  Chest radiograph performed earlier today at 2:34 a.m., and right upper quadrant ultrasound performed 04/29/2014 FINDINGS: CTA CHEST FINDINGS Cardiovascular: There is no evidence of aortic dissection. There is no evidence of aneurysmal dilatation. Mild calcification is seen along the aortic arch and proximal left subclavian  artery. There is no evidence of significant pulmonary embolus. The heart remains normal in size. Scattered coronary artery calcifications are seen. Mediastinum/Nodes: The mediastinum is unremarkable in appearance. No mediastinal lymphadenopathy is seen. No pericardial effusion is identified. The thyroid gland is unremarkable. No axillary lymphadenopathy is appreciated. Lungs/Pleura: Minimal bibasilar atelectasis is noted. No pleural effusion or pneumothorax is seen. No masses are identified. Musculoskeletal: No acute osseous abnormalities are identified. Anterior cervical spinal fusion hardware is noted. The visualized musculature is unremarkable in appearance. Review of the MIP images confirms the above findings. CTA ABDOMEN AND PELVIS FINDINGS VASCULAR Aorta: There is no evidence of aortic dissection. There is no evidence of aneurysmal dilatation. Mild calcification is seen along the abdominal aorta and its branches. Celiac: The celiac trunk remains patent. SMA: The superior mesenteric artery remains patent, with mild calcification at the origin of the superior mesenteric artery. Renals: The renal arteries appear patent bilaterally, with mild calcification at the origins of both renal arteries. IMA: The inferior mesenteric artery is unremarkable in appearance. Inflow: Mild scattered calcification is seen along the common and internal iliac arteries bilaterally, and along the left common femoral artery. Veins: The visualized venous structures are grossly unremarkable. Review of the MIP images confirms the above findings. NON-VASCULAR Hepatobiliary: Mild soft tissue inflammation is noted about the common bile duct and base of the gallbladder. This raises question for mild cholecystitis. The liver is unremarkable in appearance. Pancreas: The pancreas is within normal limits. Spleen: The spleen is unremarkable in appearance. Adrenals/Urinary Tract: The adrenal glands are unremarkable in appearance. Nonspecific  perinephric stranding is noted bilaterally. Mild bilateral renal scarring is noted. There is no evidence hydronephrosis. No renal or ureteral stones are identified. Stomach/Bowel: The stomach is unremarkable in appearance. The small bowel is within normal limits. The appendix is normal in caliber, without evidence of appendicitis. Prominent diverticula are noted at the cecum, and a few additional scattered diverticula are noted along the remainder of the colon. Mild wall thickening along the ascending colon may simply reflect intraluminal contents, though a mild infectious or inflammatory process might have a similar appearance. Lymphatic: No retroperitoneal or pelvic sidewall lymphadenopathy is seen. Reproductive: The bladder is mildly distended and grossly unremarkable. The patient is status post prostatectomy. Other: No additional soft tissue abnormalities are seen. Musculoskeletal: No acute osseous abnormalities are identified. The visualized musculature is unremarkable in appearance. Review of the MIP images confirms the above findings. IMPRESSION: 1. No  evidence of aortic dissection. No evidence of aneurysmal dilatation. 2. No evidence of significant pulmonary embolus. 3. Mild soft tissue inflammation about the common bile duct and base of the gallbladder. This raises question for mild cholecystitis, though it may remain within normal limits. Would correlate with the patient's symptoms. 4. Mild wall thickening along the ascending colon may simply reflect intraluminal contents, though a mild infectious or inflammatory process might have a similar appearance. 5. Aortic Atherosclerosis (ICD10-I70.0). 6. Scattered coronary artery calcifications. 7. Prominent diverticula at the cecum, and scattered diverticula along the remainder of the colon. Electronically Signed   By: Garald Balding M.D.   On: 09/26/2017 03:27   US Abdomen Limited Ruq  Result Date: 09/26/2017 CLINICAL DATA:  Acute onset of right upper  quadrant abdominal pain. EXAM: ULTRASOUND ABDOMEN LIMITED RIGHT UPPER QUADRANT COMPARISON:  Right upper quadrant ultrasound performed 04/29/2014 FINDINGS: Gallbladder: No gallstones or wall thickening visualized. No sonographic Murphy sign noted by sonographer. Common bile duct: Diameter: 0.3 cm, within normal limits in caliber. Liver: No focal lesion identified. Within normal limits in parenchymal echogenicity. Portal vein is patent on color Doppler imaging with normal direction of blood flow towards the liver. IMPRESSION: Unremarkable ultrasound of the right upper quadrant. Electronically Signed   By: Garald Balding M.D.   On: 09/26/2017 04:44    EKG: Independently reviewed.  Assessment/Plan Principal Problem:   Sepsis ([place]) Active Problems:   Gout   Alcohol abuse, daily use   CHF with left ventricular diastolic dysfunction, NYHA class 2 (HCC)   Elevated LFTs   A-fib (HCC)   Lactic acidosis   Epigastric pain    1. Sepsis - Tachycardia, tachypnea, WBC elevation and lactic acidosis 1. Appears to be epigastric / RUQ source based on exam 2. RUQ US unremarkable, CT suggestive of inflamation about CBD 1. ? Early ascending cholangitis 2. ? EtOH hepatitis 3. ? Gastritis 4. ? Ulcer (though I would have expected to see perf on the CT scan which they didn't) 5. SMA patent on CTA 6. Lipase neg and no mention of severe acute pancreatitis on CT 3. Got 500cc bolus, lactate worsened, got 1L bolus, giving a 2nd L now to bring total bolus up to the 2500 needed for 57ml/kg goal 4. Then NS at 125 cc/hr 5. Repeat CBC/CMP at noon 6. Empiric zosyn 7. BCx pending 8. Serial trops ordered and tele monitor, but coronary seems less likely given negative trops x6-7 hours after symptom onset 9. Clear liquid diet 2. A.Fib - 1. Holding Cardizem and metoprolol in setting of acute sepsis 2. May need short acting IV metoprolol or Cardizem gtt if / when A.Fib goes into worsening RVR 3. Hold eliquis and put  patient on heparin gtt instead for now (incase this needs to be stopped acutely for surgery or procedural reasons). 3. Elevated LFTs - 1. Slightly worse than prior baseline 2. 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 ? If acute component, possibly due to a cholangitis 4. Repeat CMP ordered for noon 4. Daily EtOH use - 1. CIWA 5. Chronic CHF - 1. Holding diuretics and giving IVF as above 6. Gout - continue Allopurinol  DVT prophylaxis: Heparin gtt Code Status: Full Family Communication: Wife at bedside Disposition Plan: TBD Consults called: None Admission status: Admit to inpatient - patient likely to need multiple days SDU stay in best case   Etta Quill DO [facility] Pager [phone]  If 7AM-7PM, please contact day team taking care of patient [email or web address] Password [facility]  09/26/2017,  5:06 AM

## 2017-09-26 NOTE — Progress Notes (Signed)
[place] TEAM 1 - Stepdown/ICU Samuel Flores  DGU:[id number] DOB: June 26, 1941 DOA: 09/26/2017 PCP: Alycia Rossetti, MD    Brief Narrative:  76 y.o. male with history of daily EtOH use, CAD, gout, and A.Fib on eliquis who presented to the ED with acute onset central chest and epigastric pain.  On EMS arrival ASA and NTG given and SBP dropped to 90s.  In the ED Trop was negative, EKG was w/o acute changes, and CTa demonstrated no dissection nor PE but did show mild inflamation around the CBD and gallbladder.  RUQ [place] was unremarkable.  Patient was found to have epigastric and RUQ tenderness.  Lipase neg.  AST 192, ALT 80, Bili 2.7.    Subjective: Pt is seen for a f/u visit.    Assessment & Plan:  SIRS v/s Sepsis of unclear etiology - lactic acidosis   Chronic Afib   Transaminitis   Daily EtOH use  Chronic diastolic CHF  Grade 1 DD via TTE 2016  Acute kidney injury   Macrocytosis  Check Z56 and folic acid   Gout   DVT prophylaxis: heparin gtt  Code Status: FULL CODE Family Communication: no family present at time of exam  Disposition Plan:   Consultants:  None   Procedures: none  Antimicrobials:  Zosyn 11/13 >  Objective: Blood pressure 110/67, pulse (!) 129, temperature 98.3 F (36.8 C), resp. rate (!) 28, height 5\' 9"  (1.753 m), weight 81.6 kg (180 lb), SpO2 96 %.  Intake/Output Summary (Last 24 hours) at 09/26/2017 0938 Last data filed at 09/26/2017 0626 Gross per 24 hour  Intake 2550 ml  Output 375 ml  Net 2175 ml   Filed Weights   09/26/17 0400  Weight: 81.6 kg (180 lb)    Examination: Pt was seen for a f/u visit.    CBC: Recent Labs  Lab 09/26/17 0142 09/26/17 0151  WBC 14.5*  --   NEUTROABS 12.3*  --   HGB 14.4 14.3  HCT 41.6 42.0  MCV 101.7*  --   PLT 133*  --    Basic Metabolic Panel: Recent Labs  Lab 09/26/17 0142 09/26/17 0151  NA 137 139  K 4.5 4.5  CL 103 101  CO2 23  --   GLUCOSE 136* 136*  BUN 19 23*    CREATININE 1.13 1.30*  CALCIUM 8.0*  --    GFR: Estimated Creatinine Clearance: 46.8 mL/min (A) (by C-G formula based on SCr of 1.3 mg/dL (H)).  Liver Function Tests: Recent Labs  Lab 09/26/17 0142  AST 192*  ALT 80*  ALKPHOS 175*  BILITOT 2.7*  PROT 5.8*  ALBUMIN 3.0*   Recent Labs  Lab 09/26/17 0142  LIPASE 41   Coagulation Profile: Recent Labs  Lab 09/26/17 0142  INR 1.26    Cardiac Enzymes: Recent Labs  Lab 09/26/17 0142 09/26/17 0507  TROPONINI <0.03 <0.03    HbA1C: Hgb A1c MFr Bld  Date/Time Value Ref Range Status  10/24/2016 10:04 AM 5.2 <5.7 % Final    Comment:      For the purpose of screening for the presence of diabetes:   <5.7%       Consistent with the absence of diabetes 5.7-6.4 %   Consistent with increased risk for diabetes (prediabetes) >=6.5 %     Consistent with diabetes   This assay result is consistent with a decreased risk of diabetes.   Currently, no consensus exists regarding use of hemoglobin A1c for diagnosis of diabetes  in children.   According to American Diabetes Association (ADA) guidelines, hemoglobin A1c <7.0% represents optimal control in non-pregnant diabetic patients. Different metrics may apply to specific patient populations. Standards of Medical Care in Diabetes (ADA).     12/24/2013 08:32 AM 5.4 <5.7 % Final    Comment:                                                                           According to the ADA Clinical Practice Recommendations for 2011, when HbA1c is used as a screening test:     >=6.5%   Diagnostic of Diabetes Mellitus            (if abnormal result is confirmed)   5.7-6.4%   Increased risk of developing Diabetes Mellitus   References:Diagnosis and Classification of Diabetes Mellitus,Diabetes [id number],6286,38(TRRNH 1):S62-S69 and Standards of Medical Care in         Diabetes - 2011,Diabetes AFBX,0383,33 (Suppl 1):S11-S61.      Scheduled Meds: . allopurinol  200 mg Oral Daily  .  aspirin EC  81 mg Oral Daily  . atorvastatin  10 mg Oral Daily  . folic acid  1 mg Oral Daily  . multivitamin with minerals  1 tablet Oral Daily  . thiamine  100 mg Oral Daily   Or  . thiamine  100 mg Intravenous Daily   Continuous Infusions: . sodium chloride 100 mL/hr at 09/26/17 0524  . heparin 1,150 Units/hr (09/26/17 0635)  . piperacillin-tazobactam (ZOSYN)  IV       LOS: 0 days   Time spent: No Charge  Cherene Altes, MD [facility] Office  [phone] Pager - Text Page per Amion as per below:  On-Call/Text Page:      [email or web address]      password [email or web address]  If 7PM-7AM, please contact night-coverage [email or web address] Password [facility] 09/26/2017, 9:38 AM

## 2017-09-26 NOTE — Progress Notes (Signed)
CRITICAL VALUE ALERT  Critical values received from lab, RN requested redraw.  Results are as followed:  Critical Value:  Heparin >2.20, PTT >200  Date & Time Notied:  09/26/17 @ 2220  Provider Notified: Tylene Fantasia, NP;   Mardella Layman, [place]  Orders Received/Actions taken: Heparin gtt stopped while pharmacy on way to see patient.  Orders to continue stopped gtt for 1 hour.  Will get new labs to reevaluate.

## 2017-09-26 NOTE — Progress Notes (Signed)
Pharmacy Antibiotic Note  DERICK SEMINARA is a 76 y.o. male admitted on 09/26/2017 with intra-abdominal infection. Pharmacy has been consulted for Zosyn dosing.  Temp 97.4, WBC 14.5, and LA 4.3. SCr 1.3 for estimated CrCl~ 45-50 mL/min.   Patient received Zosyn 3.375g IV x1 in the ED.   Plan: Zosyn 3.375g IV q8hr Monitor renal function, clinical picture, and culture data F/u length of therapy   Temp (24hrs), Avg:97.4 F (36.3 C), Min:97.4 F (36.3 C), Max:97.4 F (36.3 C)  Recent Labs  Lab 09/26/17 0142 09/26/17 0151 09/26/17 0152 09/26/17 0347  WBC 14.5*  --   --   --   CREATININE 1.13 1.30*  --   --   LATICACIDVEN  --   --  3.97* 4.30*    CrCl cannot be calculated (Unknown ideal weight.).    No Known Allergies  Antimicrobials this admission: 11/14 Zosyn >>   Microbiology results: pending   Lavonda Jumbo, PharmD Clinical Pharmacist 09/26/17 4:46 AM

## 2017-09-26 NOTE — ED Provider Notes (Signed)
[facility] EMERGENCY DEPARTMENT Provider Note   CSN: [id number] Arrival date & time: 09/26/17  0129     History   Chief Complaint Chief Complaint  Patient presents with  . Chest Pain    HPI Samuel Flores is a 76 y.o. male.  Level 5 caveat for acute if condition.  Patient arrives by EMS with central chest pain and epigastric pain that started while he was resting about 7:30 PM.  Patient pale upon EMS arrival.  Received aspirin and nitroglycerin and morphine which caused him to drop his blood pressure into the 90s.  Patient complains of ongoing chest and epigastric pain as well as nausea.  Pain does not radiate to his back or neck.  No shortness of breath or vomiting.  No dizziness or lightheadedness.  The patient reports similar pain in the past from his hiatal hernia.  Denies any cardiac history other than recently diagnosed atrial fibrillation on Eliquis.  Patient does drink about 8 ounces of bourbon on a daily basis but did not have any more than usual today.  Denies any problems with acid reflux or gallbladder.  No previous abdominal surgeries.  Patient's pain was not relieved by morphine or nitroglycerin he was given 200 cc of fluid by EMS without improvement in his blood pressure.   The history is provided by the patient and the EMS personnel.  Chest Pain   Associated symptoms include abdominal pain and nausea. Pertinent negatives include no cough, no dizziness, no fever, no headaches, no shortness of breath, no vomiting and no weakness.    Past Medical History:  Diagnosis Date  . Alcohol abuse, daily use 08/18/2013  . Arthritis    osteoarthritis  . CAD (coronary artery disease)    Non obstructive  . Cancer [facility]) 1994   prostate  . Diastolic dysfunction Echo 2012   Grade 1  . Gout   . 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 Hx-TIA (transient ischemic attack) 2011  . Hypercholesteremia   . Hypertension     Patient Active Problem List   Diagnosis Date  Noted  . New onset atrial fibrillation ([place]) 02/18/2017  . Chronic diarrhea 11/27/2014  . Elevated LFTs 07/27/2014  . CHF with left ventricular diastolic dysfunction, NYHA class 2 ([place]) 03/24/2014  . Hypokalemia 12/24/2013  . Peripheral edema 10/07/2013  . Ventricular bigeminy seen on cardiac monitor 07/29/13 at [facility] 08/27/2013  . Alcohol abuse, daily use 08/18/2013  . HBP (high blood pressure) 07/15/2013  . Pulmonary hypertension ([place]) 05/28/2013  . DOE (dyspnea on exertion) 04/28/2013  . Dizziness and giddiness 04/28/2013  . Hypercholesteremia   . Cancer ([place])   . Gout   . Hx of subdural hematoma     Past Surgical History:  Procedure Laterality Date  . BURR HOLE FOR SUBDURAL HEMATOMA  03/2011  . EYE SURGERY Left 03/2009   cataract  . PROSTATE SURGERY  1994  . SPINE SURGERY     cervical       Home Medications    Prior to Admission medications   Medication Sig Start Date End Date Taking? Authorizing Provider  allopurinol (ZYLOPRIM) 100 MG tablet Take 2 tablets (200 mg total) by mouth daily. 05/23/17   Brenda, Modena Nunnery, MD  apixaban (ELIQUIS) 5 MG TABS tablet Take 1 tablet (5 mg total) by mouth 2 (two) times daily. 03/01/17   Alycia Rossetti, MD  aspirin EC 81 MG tablet Take 81 mg by mouth daily.    [provider]  atorvastatin (LIPITOR) 20 MG tablet TAKE 1/2 TABLET BY MOUTH DAILY 09/03/17   Vic Blackbird F, MD  enalapril (VASOTEC) 20 MG tablet TAKE 1 TABLETS BY MOUTH DAILY Patient taking differently: TAKE 1/2 TABLETS BY MOUTH DAILY 12/01/16   Dena Billet B, PA-C  metoprolol succinate (TOPROL-XL) 25 MG 24 hr tablet Take 37.5 mg by mouth 2 (two) times daily.    [provider]  spironolactone (ALDACTONE) 25 MG tablet Take 1 tablet (25 mg total) by mouth daily. Patient taking differently: Take 12.5 mg by mouth daily.  07/27/14   Alycia Rossetti, MD    Family History Family History  Problem Relation Age of Onset  . Lung cancer Mother        never  smoker  . Prostate cancer Father     Social History Social History   Tobacco Use  . Smoking status: Former Smoker    Years: 39.00    Types: Pipe    Last attempt to quit: 04/09/2013    Years since quitting: 4.4  . Smokeless tobacco: Never Used  Substance Use Topics  . Alcohol use: Yes    Alcohol/week: 33.6 oz    Types: 14 Cans of beer, 42 Shots of liquor per week  . Drug use: No     Allergies   Patient has no known allergies.   Review of Systems Review of Systems  Constitutional: Positive for activity change and appetite change. Negative for fever.  HENT: Negative for congestion, dental problem and rhinorrhea.   Respiratory: Negative for cough, chest tightness and shortness of breath.   Cardiovascular: Positive for chest pain.  Gastrointestinal: Positive for abdominal pain and nausea. Negative for vomiting.  Genitourinary: Negative for dysuria, hematuria and scrotal swelling.  Musculoskeletal: Negative for arthralgias and gait problem.  Skin: Negative for rash.  Neurological: Negative for dizziness, weakness and headaches.    all other systems are negative except as noted in the HPI and PMH.    Physical Exam Updated Vital Signs BP (!) 90/54   Pulse 84   Temp (!) 97.4 F (36.3 C) (Oral)   Resp (!) 21   SpO2 94%   Physical Exam  Constitutional: He is oriented to person, place, and time. He appears well-developed and well-nourished. He appears distressed.  HENT:  Head: Normocephalic and atraumatic.  Mouth/Throat: Oropharynx is clear and moist. No oropharyngeal exudate.  Eyes: Conjunctivae and EOM are normal. Pupils are equal, round, and reactive to light.  Neck: Normal range of motion. Neck supple.  No meningismus.  Cardiovascular: Normal rate, regular rhythm, normal heart sounds and intact distal pulses.  No murmur heard. Equal femoral and radial pulses  Pulmonary/Chest: Effort normal and breath sounds normal. No respiratory distress. He exhibits no  tenderness.  Abdominal: Soft. There is tenderness. There is no rebound and no guarding.  TTP RUQ and epigastrium   Musculoskeletal: Normal range of motion. He exhibits no edema or tenderness.  No CVAT  Neurological: He is alert and oriented to person, place, and time. No cranial nerve deficit. He exhibits normal muscle tone. Coordination normal.  No ataxia on finger to nose bilaterally. No pronator drift. 5/5 strength throughout. CN 2-12 intact.Equal grip strength. Sensation intact.   Skin: Skin is warm. Capillary refill takes less than 2 seconds. No rash noted. He is diaphoretic.  Psychiatric: He has a normal mood and affect. His behavior is normal.  Nursing note and vitals reviewed.    ED Treatments / Results  Labs (all labs  ordered are listed, but only abnormal results are displayed) Labs Reviewed  CBC WITH DIFFERENTIAL/PLATELET - Abnormal; Notable for the following components:      Result Value   WBC 14.5 (*)    RBC 4.09 (*)    MCV 101.7 (*)    MCH 35.2 (*)    Platelets 133 (*)    Neutro Abs 12.3 (*)    All other components within normal limits  COMPREHENSIVE METABOLIC PANEL - Abnormal; Notable for the following components:   Glucose, Bld 136 (*)    Calcium 8.0 (*)    Total Protein 5.8 (*)    Albumin 3.0 (*)    AST 192 (*)    ALT 80 (*)    Alkaline Phosphatase 175 (*)    Total Bilirubin 2.7 (*)    All other components within normal limits  PROTIME-INR - Abnormal; Notable for the following components:   Prothrombin Time 15.7 (*)    All other components within normal limits  LACTIC ACID, PLASMA - Abnormal; Notable for the following components:   Lactic Acid, Venous 3.5 (*)    All other components within normal limits  I-STAT CG4 LACTIC ACID, ED - Abnormal; Notable for the following components:   Lactic Acid, Venous 3.97 (*)    All other components within normal limits  I-STAT CHEM 8, ED - Abnormal; Notable for the following components:   BUN 23 (*)    Creatinine, Ser  1.30 (*)    Glucose, Bld 136 (*)    Calcium, Ion 0.98 (*)    All other components within normal limits  I-STAT CG4 LACTIC ACID, ED - Abnormal; Notable for the following components:   Lactic Acid, Venous 4.30 (*)    All other components within normal limits  CULTURE, BLOOD (ROUTINE X 2)  CULTURE, BLOOD (ROUTINE X 2)  LIPASE, BLOOD  TROPONIN I  TROPONIN I  PROCALCITONIN  TROPONIN I  TROPONIN I  LACTIC ACID, PLASMA  CBC  COMPREHENSIVE METABOLIC PANEL  HEPARIN LEVEL (UNFRACTIONATED)  APTT  I-STAT TROPONIN, ED    EKG  EKG Interpretation  Date/Time:  Wednesday September 26 2017 01:33:01 EST Ventricular Rate:  77 PR Interval:    QRS Duration: 91 QT Interval:  383 QTC Calculation: 434 R Axis:   -43 Text Interpretation:  Atrial fibrillation Ventricular bigeminy Left axis deviation Low voltage, precordial leads Consider anterior infarct Abnormal T, consider ischemia, lateral leads No significant change was found Confirmed by Ezequiel Essex [phone]) on 09/26/2017 1:49:12 AM       Radiology Dg Chest Portable 1 View  Result Date: 09/26/2017 CLINICAL DATA:  Acute onset of substernal chest pain.  Nausea. EXAM: PORTABLE CHEST 1 VIEW COMPARISON:  Chest radiograph performed 06/02/2013 FINDINGS: The lungs are mildly hypoexpanded. Mild left basilar airspace opacity likely reflects atelectasis. No pleural effusion or pneumothorax is seen. The cardiomediastinal silhouette is borderline normal in size. No acute osseous abnormalities are identified. Cervical spinal fusion hardware is noted. IMPRESSION: Lungs mildly hypoexpanded. Mild left basilar airspace opacity likely reflects atelectasis. Electronically Signed   By: Garald Balding M.D.   On: 09/26/2017 02:22   Ct Angio Chest/abd/pel For Dissection W And/or Wo Contrast  Result Date: 09/26/2017 CLINICAL DATA:  Acute onset of substernal chest pain. Nausea and left-sided abdominal pain. EXAM: CT ANGIOGRAPHY CHEST, ABDOMEN AND PELVIS TECHNIQUE:  Multidetector CT imaging through the chest, abdomen and pelvis was performed using the standard protocol during bolus administration of intravenous contrast. Multiplanar reconstructed images and MIPs were obtained and  reviewed to evaluate the vascular anatomy. CONTRAST:  179mL ISOVUE-370 IOPAMIDOL (ISOVUE-370) INJECTION 76% COMPARISON:  Chest radiograph performed earlier today at 2:34 a.m., and right upper quadrant ultrasound performed 04/29/2014 FINDINGS: CTA CHEST FINDINGS Cardiovascular: There is no evidence of aortic dissection. There is no evidence of aneurysmal dilatation. Mild calcification is seen along the aortic arch and proximal left subclavian artery. There is no evidence of significant pulmonary embolus. The heart remains normal in size. Scattered coronary artery calcifications are seen. Mediastinum/Nodes: The mediastinum is unremarkable in appearance. No mediastinal lymphadenopathy is seen. No pericardial effusion is identified. The thyroid gland is unremarkable. No axillary lymphadenopathy is appreciated. Lungs/Pleura: Minimal bibasilar atelectasis is noted. No pleural effusion or pneumothorax is seen. No masses are identified. Musculoskeletal: No acute osseous abnormalities are identified. Anterior cervical spinal fusion hardware is noted. The visualized musculature is unremarkable in appearance. Review of the MIP images confirms the above findings. CTA ABDOMEN AND PELVIS FINDINGS VASCULAR Aorta: There is no evidence of aortic dissection. There is no evidence of aneurysmal dilatation. Mild calcification is seen along the abdominal aorta and its branches. Celiac: The celiac trunk remains patent. SMA: The superior mesenteric artery remains patent, with mild calcification at the origin of the superior mesenteric artery. Renals: The renal arteries appear patent bilaterally, with mild calcification at the origins of both renal arteries. IMA: The inferior mesenteric artery is unremarkable in appearance.  Inflow: Mild scattered calcification is seen along the common and internal iliac arteries bilaterally, and along the left common femoral artery. Veins: The visualized venous structures are grossly unremarkable. Review of the MIP images confirms the above findings. NON-VASCULAR Hepatobiliary: Mild soft tissue inflammation is noted about the common bile duct and base of the gallbladder. This raises question for mild cholecystitis. The liver is unremarkable in appearance. Pancreas: The pancreas is within normal limits. Spleen: The spleen is unremarkable in appearance. Adrenals/Urinary Tract: The adrenal glands are unremarkable in appearance. Nonspecific perinephric stranding is noted bilaterally. Mild bilateral renal scarring is noted. There is no evidence hydronephrosis. No renal or ureteral stones are identified. Stomach/Bowel: The stomach is unremarkable in appearance. The small bowel is within normal limits. The appendix is normal in caliber, without evidence of appendicitis. Prominent diverticula are noted at the cecum, and a few additional scattered diverticula are noted along the remainder of the colon. Mild wall thickening along the ascending colon may simply reflect intraluminal contents, though a mild infectious or inflammatory process might have a similar appearance. Lymphatic: No retroperitoneal or pelvic sidewall lymphadenopathy is seen. Reproductive: The bladder is mildly distended and grossly unremarkable. The patient is status post prostatectomy. Other: No additional soft tissue abnormalities are seen. Musculoskeletal: No acute osseous abnormalities are identified. The visualized musculature is unremarkable in appearance. Review of the MIP images confirms the above findings. IMPRESSION: 1. No evidence of aortic dissection. No evidence of aneurysmal dilatation. 2. No evidence of significant pulmonary embolus. 3. Mild soft tissue inflammation about the common bile duct and base of the gallbladder. This  raises question for mild cholecystitis, though it may remain within normal limits. Would correlate with the patient's symptoms. 4. Mild wall thickening along the ascending colon may simply reflect intraluminal contents, though a mild infectious or inflammatory process might have a similar appearance. 5. Aortic Atherosclerosis (ICD10-I70.0). 6. Scattered coronary artery calcifications. 7. Prominent diverticula at the cecum, and scattered diverticula along the remainder of the colon. Electronically Signed   By: Garald Balding M.D.   On: 09/26/2017 03:27   US Abdomen  Limited Ruq  Result Date: 09/26/2017 CLINICAL DATA:  Acute onset of right upper quadrant abdominal pain. EXAM: ULTRASOUND ABDOMEN LIMITED RIGHT UPPER QUADRANT COMPARISON:  Right upper quadrant ultrasound performed 04/29/2014 FINDINGS: Gallbladder: No gallstones or wall thickening visualized. No sonographic Murphy sign noted by sonographer. Common bile duct: Diameter: 0.3 cm, within normal limits in caliber. Liver: No focal lesion identified. Within normal limits in parenchymal echogenicity. Portal vein is patent on color Doppler imaging with normal direction of blood flow towards the liver. IMPRESSION: Unremarkable ultrasound of the right upper quadrant. Electronically Signed   By: Garald Balding M.D.   On: 09/26/2017 04:44    Procedures Procedures (including critical care time)  Medications Ordered in ED Medications  fentaNYL (SUBLIMAZE) injection 25 mcg (not administered)  ondansetron (ZOFRAN) injection 4 mg (not administered)     Initial Impression / Assessment and Plan / ED Course  I have reviewed the triage vital signs and the nursing notes.  Pertinent labs & imaging results that were available during my care of the patient were reviewed by me and considered in my medical decision making (see chart for details).     Epigastric and chest pain since 7pm with nausea.  No ischemia on EKG. No improvement with NTG or morphine.    Tenderness in epigastric and RUQ.  Troponin negative. LFTs elevated, lipase normal.  CT shows inflammation of stomach as well as gallbladder. Lactate elevated.  IVF and IV zosyn given.  POssible cholangitis.?  RUQ pending. Doubt ACS.  Will evaluate gallbladder and treat as possible cholangitis.  Will also cycle troponins.   Admission d/w Dr. Alcario Drought.  CRITICAL CARE Performed by: Ezequiel Essex Total critical care time: 45 minutes Critical care time was exclusive of separately billable procedures and treating other patients. Critical care was necessary to treat or prevent imminent or life-threatening deterioration. Critical care was time spent personally by me on the following activities: development of treatment plan with patient and/or surrogate as well as nursing, discussions with consultants, evaluation of patient's response to treatment, examination of patient, obtaining history from patient or surrogate, ordering and performing treatments and interventions, ordering and review of laboratory studies, ordering and review of radiographic studies, pulse oximetry and re-evaluation of patient's condition.   Final Clinical Impressions(s) / ED Diagnoses   Final diagnoses:  RUQ pain  Ascending cholangitis    ED Discharge Orders    None       Oralee Rapaport, Annie Main, MD 09/26/17 [phone]

## 2017-09-26 NOTE — ED Triage Notes (Signed)
Per [facility], Pt from home.  Pt started having crushing substernal chest pain around 7:30 pm. Pt was pale upon arrival. Pt received 324 ASA, 2 nitro, 4 mg of morphine, 4 mg of zofran, and 200 ml NS bolus en route. Pt complains of nausea and abdominal pain on L side. Pt denies SHOB. Pt alert and oriented.

## 2017-09-26 NOTE — ED Notes (Signed)
Admitting MD at bedside.

## 2017-09-26 NOTE — Progress Notes (Addendum)
PHARMACY - PHYSICIAN COMMUNICATION CRITICAL VALUE ALERT - BLOOD CULTURE IDENTIFICATION (BCID)  Results for orders placed or performed during the hospital encounter of 09/26/17  Blood Culture ID Panel (Reflexed) (Collected: 09/26/2017  5:10 AM)  Result Value Ref Range   Enterococcus species NOT DETECTED NOT DETECTED   Listeria monocytogenes NOT DETECTED NOT DETECTED   Staphylococcus species NOT DETECTED NOT DETECTED   Staphylococcus aureus NOT DETECTED NOT DETECTED   Streptococcus species NOT DETECTED NOT DETECTED   Streptococcus agalactiae NOT DETECTED NOT DETECTED   Streptococcus pneumoniae NOT DETECTED NOT DETECTED   Streptococcus pyogenes NOT DETECTED NOT DETECTED   Acinetobacter baumannii NOT DETECTED NOT DETECTED   Enterobacteriaceae species DETECTED (A) NOT DETECTED   Enterobacter cloacae complex NOT DETECTED NOT DETECTED   Escherichia coli NOT DETECTED NOT DETECTED   Klebsiella oxytoca NOT DETECTED NOT DETECTED   Klebsiella pneumoniae DETECTED (A) NOT DETECTED   Proteus species NOT DETECTED NOT DETECTED   Serratia marcescens NOT DETECTED NOT DETECTED   Carbapenem resistance NOT DETECTED NOT DETECTED   Haemophilus influenzae NOT DETECTED NOT DETECTED   Neisseria meningitidis NOT DETECTED NOT DETECTED   Pseudomonas aeruginosa NOT DETECTED NOT DETECTED   Candida albicans NOT DETECTED NOT DETECTED   Candida glabrata NOT DETECTED NOT DETECTED   Candida krusei NOT DETECTED NOT DETECTED   Candida parapsilosis NOT DETECTED NOT DETECTED   Candida tropicalis NOT DETECTED NOT DETECTED    Name of physician (or Provider) Contacted: Tylene Fantasia  Changes to prescribed antibiotics required: On zosyn but could consider de-escalating to ceftriaxone.  Deboraha Sprang 09/26/2017  7:47 PM    ADDENDUM: Pharmacy consulted to switch the patient from zosyn to Ceftriaxone. Will dose ceftriaxone 2g IV q24 and follow up culture sensitivities.  Deboraha Sprang 09/26/2017, 8:01 PM

## 2017-09-26 NOTE — ED Notes (Signed)
Pt's wife Louie Casa would like to be notified with any bed assignments/updates [phone].

## 2017-09-26 NOTE — Progress Notes (Addendum)
ANTICOAGULATION CONSULT NOTE - Initial Consult  Pharmacy Consult for heparin Indication: atrial fibrillation  No Known Allergies  Patient Measurements: Height: 5\' 9"  (175.3 cm) Weight: 180 lb (81.6 kg)(per pt report ) IBW/kg (Calculated) : 70.7 Heparin Dosing Weight: 81.6 kg   Vital Signs: Temp: 98.3 F (36.8 C) (11/14 0445) Temp Source: Oral (11/14 0133) BP: 127/68 (11/14 0445) Pulse Rate: 110 (11/14 0445)  Labs: Recent Labs    09/26/17 0142 09/26/17 0151  HGB 14.4 14.3  HCT 41.6 42.0  PLT 133*  --   LABPROT 15.7*  --   INR 1.26  --   CREATININE 1.13 1.30*  TROPONINI <0.03  --     Estimated Creatinine Clearance: 46.8 mL/min (A) (by C-G formula based on SCr of 1.3 mg/dL (H)).   Medical History: Past Medical History:  Diagnosis Date  . Alcohol abuse, daily use 08/18/2013  . Arthritis    osteoarthritis  . CAD (coronary artery disease)    Non obstructive  . Cancer [facility]) 1994   prostate  . Diastolic dysfunction Echo 2012   Grade 1  . Gout   . 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 Hx-TIA (transient ischemic attack) 2011  . Hypercholesteremia   . Hypertension    Assessment: 76 yo male admitted with chest pain and suspected intra-abdominal infection. On Eliquis PTA for afib, with last dose 11/13 at 9 AM per patient report. Pharmacy consulted to dose heparin while Eliquis for any possible surgical intervention.   Hgb normal and plts slightly low at 133. No s/s bleeding documented.   Will follow aPTT, as recent Eliquis will likely falsely elevated heparin levels. No bolus in setting of recent Eliquis.   Goal of Therapy:  Heparin level 0.3-0.7 units/ml  aptt 66-102s Monitor platelets by anticoagulation protocol: Yes   Plan:  Start heparin gtt at 1150 units/hr  Heparin level and aPTT in 8 hrs Daily heparin level, aPTT, and CBC Monitor for s/s bleeding   Lavonda Jumbo, PharmD Clinical Pharmacist 09/26/17 5:27 AM

## 2017-09-26 NOTE — Progress Notes (Signed)
ANTICOAGULATION CONSULT NOTE - Follow Up Consult  Pharmacy Consult for heparin Indication: atrial fibrillation  No Known Allergies  Patient Measurements: Height: 5\' 9"  (175.3 cm) Weight: 180 lb (81.6 kg)(per pt report ) IBW/kg (Calculated) : 70.7 Heparin Dosing Weight: 81.6 kg   Vital Signs: Temp: 98.5 F (36.9 C) (11/14 1921) Temp Source: Oral (11/14 1921) BP: 120/81 (11/14 1921) Pulse Rate: 40 (11/14 1921)  Labs: Recent Labs    09/26/17 0142 09/26/17 0151 09/26/17 0507 09/26/17 1844 09/26/17 2053  HGB 14.4 14.3  --   --   --   HCT 41.6 42.0  --   --   --   PLT 133*  --   --   --   --   APTT  --   --   --   --  >200*  LABPROT 15.7*  --   --   --   --   INR 1.26  --   --   --   --   HEPARINUNFRC  --   --   --  >2.20* >2.20*  CREATININE 1.13 1.30*  --   --   --   TROPONINI <0.03  --  <0.03  --   --     Estimated Creatinine Clearance: 46.8 mL/min (A) (by C-G formula based on SCr of 1.3 mg/dL (H)).  Medications: Heparin @ 1150 units/hr  Assessment: 76 yo male admitted with chest pain and suspected intra-abdominal infection. On Eliquis PTA for afib, with last dose 11/13 at 9 AM per patient report. Pharmacy consulted to dose heparin while eliquis on hold for any possible surgical intervention.   Initial heparin level > 2.2 and aPTT > 200 seconds. Verified that labs drawn correctly in arm opposite of heparin. Minor bleeding noted at IV site.  Goal of Therapy:  Heparin level 0.3-0.7 units/ml  aptt 66-102s Monitor platelets by anticoagulation protocol: Yes   Plan:  1) Hold heparin x 1 hour then resume @ 900 units/hr 2) Check 8 hour aPTT and heparin level  Nena Jordan, PharmD, BCPS 09/26/17 10:39 PM

## 2017-09-26 NOTE — ED Notes (Signed)
Pt placed on 2l  d/t SpO2 dropping to 89% while asleep.

## 2017-09-26 NOTE — ED Notes (Signed)
Label would not print from sunquest for labs

## 2017-09-27 ENCOUNTER — Other Ambulatory Visit: Payer: Self-pay

## 2017-09-27 ENCOUNTER — Encounter (HOSPITAL_COMMUNITY): Payer: Self-pay

## 2017-09-27 LAB — CBC
HCT: 36.7 % — ABNORMAL LOW (ref 39.0–52.0)
Hemoglobin: 12.5 g/dL — ABNORMAL LOW (ref 13.0–17.0)
MCH: 35.4 pg — AB (ref 26.0–34.0)
MCHC: 34.1 g/dL (ref 30.0–36.0)
MCV: 104 fL — AB (ref 78.0–100.0)
PLATELETS: 65 10*3/uL — AB (ref 150–400)
RBC: 3.53 MIL/uL — ABNORMAL LOW (ref 4.22–5.81)
RDW: 13.6 % (ref 11.5–15.5)
WBC: 13.7 10*3/uL — AB (ref 4.0–10.5)

## 2017-09-27 LAB — COMPREHENSIVE METABOLIC PANEL
ALK PHOS: 138 U/L — AB (ref 38–126)
ALT: 108 U/L — ABNORMAL HIGH (ref 17–63)
ANION GAP: 9 (ref 5–15)
AST: 160 U/L — AB (ref 15–41)
Albumin: 2.5 g/dL — ABNORMAL LOW (ref 3.5–5.0)
BILIRUBIN TOTAL: 8.1 mg/dL — AB (ref 0.3–1.2)
BUN: 14 mg/dL (ref 6–20)
CALCIUM: 6.9 mg/dL — AB (ref 8.9–10.3)
CO2: 21 mmol/L — ABNORMAL LOW (ref 22–32)
Chloride: 104 mmol/L (ref 101–111)
Creatinine, Ser: 1 mg/dL (ref 0.61–1.24)
GFR calc Af Amer: >60 mL/min (ref >60)
Glucose, Bld: 130 mg/dL — ABNORMAL HIGH (ref 65–99)
POTASSIUM: 3.6 mmol/L (ref 3.5–5.1)
Sodium: 134 mmol/L — ABNORMAL LOW (ref 135–145)
Total Protein: 5 g/dL — ABNORMAL LOW (ref 6.5–8.1)

## 2017-09-27 LAB — HEPATIC FUNCTION PANEL
ALBUMIN: 2.5 g/dL — AB (ref 3.5–5.0)
ALK PHOS: 135 U/L — AB (ref 38–126)
ALT: 106 U/L — AB (ref 17–63)
AST: 155 U/L — AB (ref 15–41)
Bilirubin, Direct: 5.5 mg/dL — ABNORMAL HIGH (ref 0.1–0.5)
Indirect Bilirubin: 2.3 mg/dL — ABNORMAL HIGH (ref 0.3–0.9)
TOTAL PROTEIN: 5.3 g/dL — AB (ref 6.5–8.1)
Total Bilirubin: 7.8 mg/dL — ABNORMAL HIGH (ref 0.3–1.2)

## 2017-09-27 LAB — URINE CULTURE: Culture: NO GROWTH

## 2017-09-27 LAB — HEPARIN LEVEL (UNFRACTIONATED): HEPARIN UNFRACTIONATED: 2.1 [IU]/mL — AB (ref 0.30–0.70)

## 2017-09-27 LAB — MRSA PCR SCREENING: MRSA BY PCR: NEGATIVE

## 2017-09-27 LAB — APTT
aPTT: >200 seconds (ref 24–36)
aPTT: 98 seconds — ABNORMAL HIGH (ref 24–36)

## 2017-09-27 LAB — LACTIC ACID, PLASMA: LACTIC ACID, VENOUS: 1.5 mmol/L (ref 0.5–1.9)

## 2017-09-27 MED ORDER — HEPARIN (PORCINE) IN NACL 100-0.45 UNIT/ML-% IJ SOLN
950.0000 [IU]/h | INTRAMUSCULAR | Status: DC
  Administered 2017-09-27: 750 [IU]/h via INTRAVENOUS

## 2017-09-27 MED ORDER — METOPROLOL TARTRATE 50 MG PO TABS
100.0000 mg | ORAL_TABLET | Freq: Two times a day (BID) | ORAL | Status: DC
  Administered 2017-09-27 (×2): 100 mg via ORAL
  Filled 2017-09-27 (×2): qty 2

## 2017-09-27 MED ORDER — ALBUTEROL SULFATE (2.5 MG/3ML) 0.083% IN NEBU
2.5000 mg | INHALATION_SOLUTION | RESPIRATORY_TRACT | Status: DC | PRN
  Administered 2017-09-27 – 2017-09-29 (×3): 2.5 mg via RESPIRATORY_TRACT
  Filled 2017-09-27 (×3): qty 3

## 2017-09-27 MED ORDER — PNEUMOCOCCAL VAC POLYVALENT 25 MCG/0.5ML IJ INJ: 0.5000 mL | INJECTION | INTRAMUSCULAR | Status: DC

## 2017-09-27 MED ORDER — PANTOPRAZOLE SODIUM 40 MG PO TBEC
40.0000 mg | DELAYED_RELEASE_TABLET | Freq: Every day | ORAL | Status: DC
  Administered 2017-09-27 – 2017-10-06 (×9): 40 mg via ORAL
  Filled 2017-09-27 (×8): qty 1

## 2017-09-27 MED ORDER — SALINE SPRAY 0.65 % NA SOLN
1.0000 | NASAL | Status: DC | PRN
  Filled 2017-09-27: qty 44

## 2017-09-27 NOTE — Progress Notes (Signed)
ANTICOAGULATION CONSULT NOTE - Follow Up Consult  Pharmacy Consult for Heparin Indication: atrial fibrillation  No Known Allergies  Patient Measurements: Height: 5\' 9"  (175.3 cm) Weight: 180 lb (81.6 kg)(per pt report ) IBW/kg (Calculated) : 70.7 Heparin Dosing Weight: 81.6 kg  Vital Signs: Temp: 98 F (36.7 C) (11/15 0700) Temp Source: Oral (11/15 0700) BP: 121/69 (11/15 0700) Pulse Rate: 111 (11/15 0700)  Labs: Recent Labs    09/26/17 0142 09/26/17 0151 09/26/17 0507 09/26/17 1844 09/26/17 2053 09/27/17 0813  HGB 14.4 14.3  --   --   --  12.5*  HCT 41.6 42.0  --   --   --  36.7*  PLT 133*  --   --   --   --  65*  APTT  --   --   --   --  >200* >200*  LABPROT 15.7*  --   --   --   --   --   INR 1.26  --   --   --   --   --   HEPARINUNFRC  --   --   --  >2.20* >2.20* 2.10*  CREATININE 1.13 1.30*  --   --   --  1.00  TROPONINI <0.03  --  <0.03  --   --   --     Estimated Creatinine Clearance: 60.9 mL/min (by C-G formula based on SCr of 1 mg/dL).   Assessment:  Anticoag: On Eliquis PTA for afib, with last dose 11/13 at 9 AM per patient report. Pharmacy consulted to dose heparin for any possible surgical intervention.  Hgb normal and plts slightly low at 133. No s/s bleeding documented.  HL 2.1 remains expectedly elevated due to Eliquis. APTT still >200 drawn properly per RN. No problems with infusion. Hgb 12.5 down. Platelets 133>65 this AM!  Goal of Therapy:  Heparin level 0.3-0.7 units/ml  APTT 66-102 Monitor platelets by anticoagulation protocol: Yes   Plan:  Hold heparin again x 1 hr. Decrease IV heparin to 750 units/hr Recheck aPTT 6 hrs after heparin resumed   Reyli Schroth S. Alford Highland, PharmD, [facility] Clinical Staff Pharmacist Pager [phone]  Eilene Ghazi Stillinger 09/27/2017,10:14 AM

## 2017-09-27 NOTE — Progress Notes (Signed)
[place] TEAM 1 - Stepdown/ICU Samuel Flores  KGU:[id number] DOB: 12-06-1940 DOA: 09/26/2017 PCP: Alycia Rossetti, MD    Brief Narrative:  76 y.o. male with history of daily EtOH use, CAD, gout, and A.Fib on eliquis who presented to the ED with acute onset central chest and epigastric pain.  On EMS arrival ASA and NTG given and SBP dropped to 90s.  In the ED Trop was negative, EKG was w/o acute changes, and CTa demonstrated no dissection nor PE but did show mild inflamation around the CBD and gallbladder.  RUQ [place] was unremarkable.  Patient was found to have epigastric and RUQ tenderness.  Lipase neg.  AST 192, ALT 80, Bili 2.7.    Subjective: The patient reports he feels much better.  His main complaint today is that of nasal congestion.  He denies any abdominal pain stating that the epigastric pain he had previously has entirely resolved.  He denies nausea or vomiting shortness of breath or headache.  He has had difficulty with RVR since his admission and has required initiation of a diltiazem drip.  Assessment & Plan:  Severe Sepsis/Lactic acidosis due to Klebsiella pneumoniae bacteremia - unknown source 2 of 2 blood cx + - abx tx narrowed - clinically improving - UA unremarkable - possible colitis v/s cholecystitis - imaging of liver, GB, and intestines not terribly impressive - when more stable will consider HIDA scan   Chronic Afib w/ acute RVR  RVR exacerbated by sepsis - now volume resuscitated w/ resolving lactic acidosis - attempt to transition back to his usual home meds (which were on hold due to hypotension) - heparin for now - once clear no surgery required will transition back to eliquis - s/p failed DCCV [name]) April 2018  Transaminitis - hx of fatty liver  Likely combination of ongoing EtOH related injury, as well as low grade shock liver - minimize APAP dosing - follow trend - check viral hepatitis panel for completeness - unclear if marked jump in [place] today is  accurate - recheck later today and in AM - clinically much improved  Daily EtOH use Counsel on need to d/c EtOH use all together   Chronic diastolic CHF - prior EtOH cardiomyopathy (resolved) Grade 1 DD via TTE 2016 - no evidence of signif volume overload at this time despite aggressive volume expansion over last 24hrs   Acute kidney injury  Most c/w pre-renal azotemia +/- element of ATN - follow - avoid ACEi/ARB or IV contrast   Recent Labs  Lab 09/26/17 0142 09/26/17 0151 09/27/17 0813  CREATININE 1.13 1.30* 1.00    Macrocytosis  Check S28 and folic acid - likely due to excessive EtOH ingestion   Gout  Quiescent   CAD 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 [name]  DVT prophylaxis: heparin gtt  Code Status: FULL CODE Family Communication: no family present at time of exam  Disposition Plan: SDU  Consultants:  None   Procedures: none  Antimicrobials:  Zosyn 11/13 > 11/14 Rocephin 11/14 >  Objective: Blood pressure 121/69, pulse (!) 111, temperature 98 F (36.7 C), temperature source Oral, resp. rate (!) 34, height 5\' 9"  (1.753 m), weight 81.6 kg (180 lb), SpO2 93 %.  Intake/Output Summary (Last 24 hours) at 09/27/2017 0947 Last data filed at 09/27/2017 0814 Gross per 24 hour  Intake 3750.71 ml  Output 1150 ml  Net 2600.71 ml   Filed Weights   09/26/17  0400  Weight: 81.6 kg (180 lb)    Examination: General: No acute respiratory distress Lungs: Clear to auscultation bilaterally without wheezes or crackles Cardiovascular: Irreg irreg - tachycardic at 120 - no rub  Abdomen: Nontender, nondistended, soft, bowel sounds positive, no rebound, no ascites, no appreciable mass Extremities: No significant edema bilateral lower extremities   CBC: Recent Labs  Lab 09/26/17 0142 09/26/17 0151 09/27/17 0813  WBC 14.5*  --  13.7*  NEUTROABS 12.3*  --   --   HGB 14.4 14.3 12.5*  HCT 41.6 42.0 36.7*  MCV 101.7*  --   104.0*  PLT 133*  --  65*   Basic Metabolic Panel: Recent Labs  Lab 09/26/17 0142 09/26/17 0151 09/27/17 0813  NA 137 139 134*  K 4.5 4.5 3.6  CL 103 101 104  CO2 23  --  21*  GLUCOSE 136* 136* 130*  BUN 19 23* 14  CREATININE 1.13 1.30* 1.00  CALCIUM 8.0*  --  6.9*   GFR: Estimated Creatinine Clearance: 60.9 mL/min (by C-G formula based on SCr of 1 mg/dL).  Liver Function Tests: Recent Labs  Lab 09/26/17 0142 09/27/17 0813  AST 192* 160*  ALT 80* 108*  ALKPHOS 175* 138*  BILITOT 2.7* 8.1*  PROT 5.8* 5.0*  ALBUMIN 3.0* 2.5*   Recent Labs  Lab 09/26/17 0142  LIPASE 41   Coagulation Profile: Recent Labs  Lab 09/26/17 0142  INR 1.26    Cardiac Enzymes: Recent Labs  Lab 09/26/17 0142 09/26/17 0507  TROPONINI <0.03 <0.03    HbA1C: Hgb A1c MFr Bld  Date/Time Value Ref Range Status  10/24/2016 10:04 AM 5.2 <5.7 % Final    Comment:      For the purpose of screening for the presence of diabetes:   <5.7%       Consistent with the absence of diabetes 5.7-6.4 %   Consistent with increased risk for diabetes (prediabetes) >=6.5 %     Consistent with diabetes   This assay result is consistent with a decreased risk of diabetes.   Currently, no consensus exists regarding use of hemoglobin A1c for diagnosis of diabetes in children.   According to American Diabetes Association (ADA) guidelines, hemoglobin A1c <7.0% represents optimal control in non-pregnant diabetic patients. Different metrics may apply to specific patient populations. Standards of Medical Care in Diabetes (ADA).     12/24/2013 08:32 AM 5.4 <5.7 % Final    Comment:                                                                           According to the ADA Clinical Practice Recommendations for 2011, when HbA1c is used as a screening test:     >=6.5%   Diagnostic of Diabetes Mellitus            (if abnormal result is confirmed)   5.7-6.4%   Increased risk of developing Diabetes  Mellitus   References:Diagnosis and Classification of Diabetes Mellitus,Diabetes IRSW,5462,70(JJKKX 1):S62-S69 and Standards of Medical Care in         Diabetes - 2011,Diabetes FGHW,2993,71 (Suppl 1):S11-S61.      Scheduled Meds: . allopurinol  200 mg Oral Daily  . aspirin EC  81 mg  Oral Daily  . folic acid  1 mg Oral Daily  . metoprolol tartrate  5 mg Intravenous Q6H  . multivitamin with minerals  1 tablet Oral Daily  . pantoprazole (PROTONIX) IV  40 mg Intravenous Q24H  . [START ON 09/28/2017] pneumococcal 23 valent vaccine  0.5 mL Intramuscular Tomorrow-1000  . thiamine  100 mg Oral Daily   Continuous Infusions: . sodium chloride 125 mL/hr at 09/27/17 0300  . cefTRIAXone (ROCEPHIN)  IV Stopped (09/26/17 2139)  . diltiazem (CARDIZEM) infusion 15 mg/hr (09/27/17 0444)  . heparin 900 Units/hr (09/27/17 0724)     LOS: 1 day    Cherene Altes, MD [facility] Office  [phone] Pager - Text Page per Amion as per below:  On-Call/Text Page:      [email or web address]      password [email or web address]  If 7PM-7AM, please contact night-coverage [email or web address] Password [facility] 09/27/2017, 9:47 AM

## 2017-09-27 NOTE — Progress Notes (Signed)
ANTICOAGULATION CONSULT NOTE - Follow Up Consult  Pharmacy Consult for Heparin Indication: atrial fibrillation  No Known Allergies  Patient Measurements: Height: 5\' 9"  (175.3 cm) Weight: 180 lb (81.6 kg)(per pt report ) IBW/kg (Calculated) : 70.7 Heparin Dosing Weight: 81.6 kg  Vital Signs: Temp: 98.5 F (36.9 C) (11/15 1600) Temp Source: Oral (11/15 1600) BP: 121/69 (11/15 0700) Pulse Rate: 111 (11/15 0700)  Labs: Recent Labs    09/26/17 0142 09/26/17 0151 09/26/17 0507 09/26/17 1844 09/26/17 2053 09/27/17 0813 09/27/17 1740  HGB 14.4 14.3  --   --   --  12.5*  --   HCT 41.6 42.0  --   --   --  36.7*  --   PLT 133*  --   --   --   --  65*  --   APTT  --   --   --   --  >200* >200* 98*  LABPROT 15.7*  --   --   --   --   --   --   INR 1.26  --   --   --   --   --   --   HEPARINUNFRC  --   --   --  >2.20* >2.20* 2.10*  --   CREATININE 1.13 1.30*  --   --   --  1.00  --   TROPONINI <0.03  --  <0.03  --   --   --   --     Estimated Creatinine Clearance: 60.9 mL/min (by C-G formula based on SCr of 1 mg/dL).  Medications: Heparin @ 750 units/hr  Assessment:  76 yo male admitted with chest pain and suspected intra-abdominal infection. On Eliquis PTA for afib, with last dose 11/13 at 9 AM per patient report. Pharmacy consulted to dose heparin while eliquis on hold for any possible surgical intervention.   APTT is finally therapeutic at 98 seconds.    Goal of Therapy:  Heparin level 0.3-0.7 units/ml  APTT 66-102 Monitor platelets by anticoagulation protocol: Yes   Plan:  1) Continue heparin at 750 units/hr 2) Check 8 hour aPTT to confirm 3) Watch platelets closely, may need to switch to a DTI  Deboraha Sprang 09/27/2017,6:44 PM

## 2017-09-27 NOTE — Progress Notes (Signed)
  Chaplain Note:  Referral for Advance Directive.  Patient indicated that he had completed one years ago but those on it HCPOA had died. He did not want to complete it here but wanted to talk with his wife.  I gave him the paper work for [employer] and indicated the phone number for our dept. So he could call and arrange an appointment if they decided they wanted to complete it later.  '  Vitalia Stough, [place]

## 2017-09-28 ENCOUNTER — Inpatient Hospital Stay (HOSPITAL_COMMUNITY): Payer: Medicare Other

## 2017-09-28 LAB — BLOOD GAS, ARTERIAL
Acid-base deficit: 0.8 mmol/L (ref 0.0–2.0)
Bicarbonate: 23.8 mmol/L (ref 20.0–28.0)
Drawn by: 39899
O2 CONTENT: 3 L/min
O2 SAT: 89.4 %
PCO2 ART: 42.5 mmHg (ref 32.0–48.0)
Patient temperature: 98.6
pH, Arterial: 7.367 (ref 7.350–7.450)
pO2, Arterial: 58.9 mmHg — ABNORMAL LOW (ref 83.0–108.0)

## 2017-09-28 LAB — APTT
APTT: 63 s — AB (ref 24–36)
aPTT: 64 seconds — ABNORMAL HIGH (ref 24–36)
aPTT: 58 seconds — ABNORMAL HIGH (ref 24–36)
aPTT: 76 seconds — ABNORMAL HIGH (ref 24–36)

## 2017-09-28 LAB — CBC
HCT: 32.8 % — ABNORMAL LOW (ref 39.0–52.0)
Hemoglobin: 11.5 g/dL — ABNORMAL LOW (ref 13.0–17.0)
MCH: 36.3 pg — AB (ref 26.0–34.0)
MCHC: 35.1 g/dL (ref 30.0–36.0)
MCV: 103.5 fL — AB (ref 78.0–100.0)
PLATELETS: 49 10*3/uL — AB (ref 150–400)
RBC: 3.17 MIL/uL — AB (ref 4.22–5.81)
RDW: 13.4 % (ref 11.5–15.5)
WBC: 10.4 10*3/uL (ref 4.0–10.5)

## 2017-09-28 LAB — CULTURE, BLOOD (ROUTINE X 2)

## 2017-09-28 LAB — FOLATE: FOLATE: 10.7 ng/mL (ref >5.9)

## 2017-09-28 LAB — COMPREHENSIVE METABOLIC PANEL
ALK PHOS: 122 U/L (ref 38–126)
ALT: 92 U/L — AB (ref 17–63)
AST: 128 U/L — AB (ref 15–41)
Albumin: 2.3 g/dL — ABNORMAL LOW (ref 3.5–5.0)
Anion gap: 7 (ref 5–15)
BUN: 18 mg/dL (ref 6–20)
CHLORIDE: 105 mmol/L (ref 101–111)
CO2: 22 mmol/L (ref 22–32)
CREATININE: 0.92 mg/dL (ref 0.61–1.24)
Calcium: 6.8 mg/dL — ABNORMAL LOW (ref 8.9–10.3)
Glucose, Bld: 113 mg/dL — ABNORMAL HIGH (ref 65–99)
Potassium: 4.2 mmol/L (ref 3.5–5.1)
SODIUM: 134 mmol/L — AB (ref 135–145)
Total Bilirubin: 7.2 mg/dL — ABNORMAL HIGH (ref 0.3–1.2)
Total Protein: 4.7 g/dL — ABNORMAL LOW (ref 6.5–8.1)

## 2017-09-28 LAB — HEPARIN LEVEL (UNFRACTIONATED): HEPARIN UNFRACTIONATED: 0.98 [IU]/mL — AB (ref 0.30–0.70)

## 2017-09-28 LAB — VITAMIN B12: Vitamin B-12: 236 pg/mL (ref 180–914)

## 2017-09-28 LAB — MAGNESIUM: Magnesium: 1.5 mg/dL — ABNORMAL LOW (ref 1.7–2.4)

## 2017-09-28 MED ORDER — METOPROLOL TARTRATE 5 MG/5ML IV SOLN
5.0000 mg | Freq: Four times a day (QID) | INTRAVENOUS | Status: DC
  Administered 2017-09-28 – 2017-10-02 (×15): 5 mg via INTRAVENOUS
  Filled 2017-09-28 (×15): qty 5

## 2017-09-28 MED ORDER — SODIUM CHLORIDE 0.9% FLUSH
3.0000 mL | Freq: Two times a day (BID) | INTRAVENOUS | Status: DC
  Administered 2017-09-28 – 2017-10-06 (×15): 3 mL via INTRAVENOUS

## 2017-09-28 MED ORDER — FUROSEMIDE 10 MG/ML IJ SOLN
40.0000 mg | Freq: Once | INTRAMUSCULAR | Status: AC
  Administered 2017-09-28: 40 mg via INTRAVENOUS
  Filled 2017-09-28: qty 4

## 2017-09-28 MED ORDER — CHLORHEXIDINE GLUCONATE 0.12 % MT SOLN
15.0000 mL | Freq: Two times a day (BID) | OROMUCOSAL | Status: DC
  Administered 2017-09-28 – 2017-10-06 (×12): 15 mL via OROMUCOSAL
  Filled 2017-09-28 (×16): qty 15

## 2017-09-28 MED ORDER — MAGNESIUM SULFATE 2 GM/50ML IV SOLN
2.0000 g | Freq: Once | INTRAVENOUS | Status: AC
  Administered 2017-09-28: 2 g via INTRAVENOUS
  Filled 2017-09-28: qty 50

## 2017-09-28 MED ORDER — BIVALIRUDIN TRIFLUOROACETATE 250 MG IV SOLR
0.1800 mg/kg/h | INTRAVENOUS | Status: AC
  Administered 2017-09-28 – 2017-09-30 (×2): 0.15 mg/kg/h via INTRAVENOUS
  Administered 2017-10-02: 0.18 mg/kg/h via INTRAVENOUS
  Filled 2017-09-28 (×5): qty 250

## 2017-09-28 MED ORDER — SODIUM CHLORIDE 0.9 % IV SOLN: 250.0000 mL | INTRAVENOUS | Status: DC | PRN

## 2017-09-28 MED ORDER — ORAL CARE MOUTH RINSE
15.0000 mL | Freq: Two times a day (BID) | OROMUCOSAL | Status: DC
  Administered 2017-09-28 – 2017-10-06 (×10): 15 mL via OROMUCOSAL

## 2017-09-28 MED ORDER — SODIUM CHLORIDE 0.9% FLUSH: 3.0000 mL | INTRAVENOUS | Status: DC | PRN

## 2017-09-28 MED ORDER — METHYLPREDNISOLONE SODIUM SUCC 125 MG IJ SOLR
125.0000 mg | Freq: Once | INTRAMUSCULAR | Status: AC
  Administered 2017-09-28: 125 mg via INTRAVENOUS
  Filled 2017-09-28: qty 2

## 2017-09-28 NOTE — Progress Notes (Addendum)
0715 Bedside shift report, pt sleeping comfortably arouses to painful stimuli. NAD, on 2L [place] , afib on tele HR 80s. Will continue to monitor.   0830 Dr. Quincy Simmonds at bedside accessing pt, new orders received, meds, CXR, and ABG.   0845 RN called paged Dr. Quincy Simmonds, frequent PVCs, bigemeny noted, Mg order received. RT placed pt on ventimask, 10L,/45%, WCTM closely. Dilt gtt increased.  1230 Mg and Platelets low, RN paged Dr. Quincy Simmonds, new orders received.  1400 BP low, dilt gtt to 15ml/hr, no change in pt's status.   1600 Dr. Quincy Simmonds called RN, CT and [place] ordered  1800 RN paged DR. Silva, pt c/o not being able to urinate, pt bladder scanned>579ml noted, pt straight cathed.   1845 Pt resting comfortably, wife at bedside, asks for sip of water but falls back asleep. CT called, awaiting transport to take pt to CT of head. VSS, NAD, awaiting shift report to oncoming RN.

## 2017-09-28 NOTE — Progress Notes (Addendum)
ANTICOAGULATION CONSULT NOTE - Follow Up Consult  Pharmacy Consult for Bivalrudin Indication: atrial fibrillation (rule out HIT)  No Known Allergies  Patient Measurements: Height: 5\' 9"  (175.3 cm) Weight: 180 lb (81.6 kg)(per pt report ) IBW/kg (Calculated) : 70.7 Heparin Dosing Weight: 81.6 kg  Vital Signs: Temp: 98 F (36.7 C) (11/16 1500) Temp Source: Axillary (11/16 1500) BP: 112/82 (11/16 1230) Pulse Rate: 101 (11/16 1230)  Labs: Recent Labs    09/26/17 0142 09/26/17 0151 09/26/17 0507  09/26/17 2053 09/27/17 0813  09/28/17 0155 09/28/17 0951 09/28/17 1544  HGB 14.4 14.3  --   --   --  12.5*  --  11.5*  --   --   HCT 41.6 42.0  --   --   --  36.7*  --  32.8*  --   --   PLT 133*  --   --   --   --  65*  --  49*  --   --   APTT  --   --   --    < > >200* >200*   < > 64* 58* 63*  LABPROT 15.7*  --   --   --   --   --   --   --   --   --   INR 1.26  --   --   --   --   --   --   --   --   --   HEPARINUNFRC  --   --   --    < > >2.20* 2.10*  --  0.98*  --   --   CREATININE 1.13 1.30*  --   --   --  1.00  --  0.92  --   --   TROPONINI <0.03  --  <0.03  --   --   --   --   --   --   --    < > = values in this interval not displayed.    Estimated Creatinine Clearance: 66.2 mL/min (by C-G formula based on SCr of 0.92 mg/dL).  Medications: Bivalrudin @ 0.15 mg/kg/hr  Assessment:  76 yo male on Eliquis PTA for afib, with last dose 11/13 at 9 AM per patient report. Pharmacy was consulted to dose heparin while eliquis on hold for any possible surgical intervention. Platelets trended down with 4T score of ~3. Decision was made by MD to change heparin to bivalrudin (direct thrombin inhibitor) and check HIT panel.   Current aPTT is 63, therapeutic on bivalrudin 0.15 mg/kg/hr. No bleeding reported.   Goal of Therapy:  APTT 50-85 Monitor platelets by anticoagulation protocol: Yes   Plan:  Continue bivalrudin at 0.15 mg/kg/hr Recheck aPTT every 2 hours until therapeutic  x2 levels, then daily aPTT Monitor daily CBC, for s/sx bleeding F/u HIT Ab results  Sloan Leiter, PharmD, BCPS, BCCCP Clinical Pharmacist Clinical phone 09/28/2017 until 11PM- #[phone] After hours, please call #[phone] 09/28/2017 4:27 PM  Addendum: 2hr repeat aPTT is therapeutic at 75. Plan: Continue current dose and follow daily aPTT.   Sloan Leiter, PharmD, BCPS, BCCCP Clinical Pharmacist Clinical phone 09/28/2017 until 11PM [phone] After hours, please call #[phone] 09/28/2017, 8:01 PM

## 2017-09-28 NOTE — Progress Notes (Addendum)
PROGRESS NOTE [address]   Samuel Flores   VWU:[id number] DOB: Feb 20, 1941  DOA: 09/26/2017 PCP: Alycia Rossetti, MD   Brief Narrative:  Samuel Flores 76 y.o.malewith history ofdaily EtOH use, CAD, gout, and A.Fib on eliquis who presented to the ED with acute onset central chest and epigastric pain.  On EMS arrival ASA and NTG given and SBP dropped to 90s.  In the ED Trop was negative, EKG was w/o acute changes, and CTa demonstrated no dissection nor PE but did show mild inflamation around the CBD and gallbladder. RUQ [place] was unremarkable. Patient was found to have epigastric and RUQ tenderness. Lipase neg. AST 192, ALT 80, Bili 2.7  Subjective: Patient see and examined, he is lethargic, patient received ativan due to suspected EtOH withdrawal. Had difficulty breathing this am with audible wheezing.   Assessment & Plan: Severe sepsis due to Klebsiella bacteremia - LLL PNA  Suspected from pneumonia initial xray showed left basilar opacity - repeated CXR today confirm PNA  CT abd with possible cholecystitis, [place] not impressive. Initially treated with zosyn, narrowed to Rocephin. Continue Abx Ceftriaxone 2G for now - sensitive. May consider HIDA when stable. Repeat blood cultures   Acute metabolic encephalopathy  ? Medication related vs suspected HIT thrombosis - will get CT head r/o stroke, if negative may need EEG  Will obtain Ammonia levels  ABG was done no CO2 retention, although it was hypoxic  Avoid sedating agents for now  Treat underlying causes   Suspected HIT  Initial platelet 133-> 66->49 after heparin ggt  4Ts Score - 6 high prob Left arm swollen and with dark discoloration ? Thrombosis, will get doppler US  Stop heparin ggt - switch to DIT per pharmacy  Check HIT Ab  HIT protocol   Chronic Afib with RVR On Cardizem ggt - HR now improved  S/p failed DCCV (ganji) 4//2018 Holding heparin concern for HIT  A/c with DIT - bivalirudin   Elevated  liver enzymes  Likely due to EtOH abuse and shock liver from sepsis  LFT's improving Hep panel pending    ETOH abuse Monitor for withdrawal   AKI  Pre renal from sepsis  Cr Improved . Monitor   DVT prophylaxis:  bivalirudin Code Status: Full code  Family Communication: None at bedside  Disposition Plan: TBD   Consultants:   None   Procedures:   None  Antimicrobials: Anti-infectives (From admission, onward)   Start     Dose/Rate Route Frequency Ordered Stop   09/26/17 2200  cefTRIAXone (ROCEPHIN) 2 g in dextrose 5 % 50 mL IVPB     2 g 100 mL/hr over 30 Minutes Intravenous Every 24 hours 09/26/17 2000     09/26/17 1200  piperacillin-tazobactam (ZOSYN) IVPB 3.375 g  Status:  Discontinued     3.375 g 12.5 mL/hr over 240 Minutes Intravenous Every 8 hours 09/26/17 0449 09/26/17 1958   09/26/17 0400  piperacillin-tazobactam (ZOSYN) IVPB 3.375 g     3.375 g 100 mL/hr over 30 Minutes Intravenous  Once 09/26/17 0353 09/26/17 0554       Objective: Vitals:   09/28/17 0422 09/28/17 0548 09/28/17 0700 09/28/17 0720  BP:      Pulse:      Resp: (!) 34   (!) 36  Temp:  98.8 F (37.1 C) 98 F (36.7 C)   TempSrc:  Axillary Axillary   SpO2: 96%   95%  Weight:      Height:  Intake/Output Summary (Last 24 hours) at 09/28/2017 0833 Last data filed at 09/28/2017 0548 Gross per 24 hour  Intake 1846.21 ml  Output 600 ml  Net 1246.21 ml   Filed Weights   09/26/17 0400  Weight: 81.6 kg (180 lb)    Examination:  General exam: Lethargic, respond to sternal rub  HEENT: AC/AT, PERRLA, OP moist and clear Respiratory system: Diminish throughout, diffuse wheezing. Using accessory muscles  Cardiovascular system: Irr Irr S1S2 no murmurs  Gastrointestinal system: Abdomen is nondistended, soft and nontender. Normal bowel sounds heard. Central nervous system: Lethargic, not following commands, oriented to person.  Extremities: No pedal edema.   Skin: No rashes, lesions or  ulcers Psychiatry: Unable to perform   Data Reviewed: I have personally reviewed following labs and imaging studies  CBC: Recent Labs  Lab 09/26/17 0142 09/26/17 0151 09/27/17 0813 09/28/17 0155  WBC 14.5*  --  13.7* 10.4  NEUTROABS 12.3*  --   --   --   HGB 14.4 14.3 12.5* 11.5*  HCT 41.6 42.0 36.7* 32.8*  MCV 101.7*  --  104.0* 103.5*  PLT 133*  --  65* 49*   Basic Metabolic Panel: Recent Labs  Lab 09/26/17 0142 09/26/17 0151 09/27/17 0813 09/28/17 0155  NA 137 139 134* 134*  K 4.5 4.5 3.6 4.2  CL 103 101 104 105  CO2 23  --  21* 22  GLUCOSE 136* 136* 130* 113*  BUN 19 23* 14 18  CREATININE 1.13 1.30* 1.00 0.92  CALCIUM 8.0*  --  6.9* 6.8*   GFR: Estimated Creatinine Clearance: 66.2 mL/min (by C-G formula based on SCr of 0.92 mg/dL). Liver Function Tests: Recent Labs  Lab 09/26/17 0142 09/27/17 0813 09/28/17 0155  AST 192* 155*  160* 128*  ALT 80* 106*  108* 92*  ALKPHOS 175* 135*  138* 122  BILITOT 2.7* 7.8*  8.1* 7.2*  PROT 5.8* 5.3*  5.0* 4.7*  ALBUMIN 3.0* 2.5*  2.5* 2.3*   Recent Labs  Lab 09/26/17 0142  LIPASE 41   No results for input(s): AMMONIA in the last 168 hours. Coagulation Profile: Recent Labs  Lab 09/26/17 0142  INR 1.26   Cardiac Enzymes: Recent Labs  Lab 09/26/17 0142 09/26/17 0507  TROPONINI <0.03 <0.03   BNP (last 3 results) No results for input(s): PROBNP in the last 8760 hours. HbA1C: No results for input(s): HGBA1C in the last 72 hours. CBG: No results for input(s): GLUCAP in the last 168 hours. Lipid Profile: No results for input(s): CHOL, HDL, LDLCALC, TRIG, CHOLHDL, LDLDIRECT in the last 72 hours. Thyroid Function Tests: No results for input(s): TSH, T4TOTAL, FREET4, T3FREE, THYROIDAB in the last 72 hours. Anemia Panel: Recent Labs    09/28/17 0155  VITAMINB12 236  FOLATE 10.7   Sepsis Labs: Recent Labs  Lab 09/26/17 0347 09/26/17 0507 09/26/17 0802 09/27/17 0813  PROCALCITON  --  0.59  --    --   LATICACIDVEN 4.30* 3.5* 3.0* 1.5    Recent Results (from the past 240 hour(s))  Blood culture (routine x 2)     Status: None (Preliminary result)   Collection Time: 09/26/17  5:00 AM  Result Value Ref Range Status   Specimen Description BLOOD RIGHT ANTECUBITAL  Final   Special Requests   Final    BOTTLES DRAWN AEROBIC AND ANAEROBIC Blood Culture results may not be optimal due to an excessive volume of blood received in culture bottles   Culture  Setup Time   Final  GRAM NEGATIVE RODS IN BOTH AEROBIC AND ANAEROBIC BOTTLES CRITICAL RESULT CALLED TO, READ BACK BY AND VERIFIED WITH: Mardella Layman PHARMD 1941 09/26/17 A BROWNING    Culture   Final    GRAM NEGATIVE RODS CULTURE REINCUBATED FOR BETTER GROWTH    Report Status PENDING  Incomplete  Blood culture (routine x 2)     Status: Abnormal (Preliminary result)   Collection Time: 09/26/17  5:10 AM  Result Value Ref Range Status   Specimen Description BLOOD RIGHT HAND  Final   Special Requests   Final    BOTTLES DRAWN AEROBIC AND ANAEROBIC Blood Culture results may not be optimal due to an excessive volume of blood received in culture bottles   Culture  Setup Time   Final    GRAM NEGATIVE RODS IN BOTH AEROBIC AND ANAEROBIC BOTTLES CRITICAL RESULT CALLED TO, READ BACK BY AND VERIFIED WITHMardella Layman PHARMD 1941 09/26/17 A BROWNING    Culture (A)  Final    KLEBSIELLA PNEUMONIAE SUSCEPTIBILITIES TO FOLLOW    Report Status PENDING  Incomplete  Blood Culture ID Panel (Reflexed)     Status: Abnormal   Collection Time: 09/26/17  5:10 AM  Result Value Ref Range Status   Enterococcus species NOT DETECTED NOT DETECTED Final   Listeria monocytogenes NOT DETECTED NOT DETECTED Final   Staphylococcus species NOT DETECTED NOT DETECTED Final   Staphylococcus aureus NOT DETECTED NOT DETECTED Final   Streptococcus species NOT DETECTED NOT DETECTED Final   Streptococcus agalactiae NOT DETECTED NOT DETECTED Final   Streptococcus pneumoniae NOT  DETECTED NOT DETECTED Final   Streptococcus pyogenes NOT DETECTED NOT DETECTED Final   Acinetobacter baumannii NOT DETECTED NOT DETECTED Final   Enterobacteriaceae species DETECTED (A) NOT DETECTED Final    Comment: Enterobacteriaceae represent a large family of gram-negative bacteria, not a single organism. CRITICAL RESULT CALLED TO, READ BACK BY AND VERIFIED WITH: Mardella Layman PHARMD [id number] 09/26/17 A BROWNING    Enterobacter cloacae complex NOT DETECTED NOT DETECTED Final   Escherichia coli NOT DETECTED NOT DETECTED Final   Klebsiella oxytoca NOT DETECTED NOT DETECTED Final   Klebsiella pneumoniae DETECTED (A) NOT DETECTED Final    Comment: CRITICAL RESULT CALLED TO, READ BACK BY AND VERIFIED WITH: Mardella Layman PHARMD 1941 09/26/17 A BROWNING    Proteus species NOT DETECTED NOT DETECTED Final   Serratia marcescens NOT DETECTED NOT DETECTED Final   Carbapenem resistance NOT DETECTED NOT DETECTED Final   Haemophilus influenzae NOT DETECTED NOT DETECTED Final   Neisseria meningitidis NOT DETECTED NOT DETECTED Final   Pseudomonas aeruginosa NOT DETECTED NOT DETECTED Final   Candida albicans NOT DETECTED NOT DETECTED Final   Candida glabrata NOT DETECTED NOT DETECTED Final   Candida krusei NOT DETECTED NOT DETECTED Final   Candida parapsilosis NOT DETECTED NOT DETECTED Final   Candida tropicalis NOT DETECTED NOT DETECTED Final  Culture, Urine     Status: None   Collection Time: 09/26/17  4:44 PM  Result Value Ref Range Status   Specimen Description URINE, CLEAN CATCH  Final   Special Requests NONE  Final   Culture NO GROWTH  Final   Report Status 09/27/2017 FINAL  Final  MRSA PCR Screening     Status: None   Collection Time: 09/26/17  8:42 PM  Result Value Ref Range Status   MRSA by PCR NEGATIVE NEGATIVE Final    Comment:        The GeneXpert MRSA Assay (FDA approved for NASAL specimens only), is  one component of a comprehensive MRSA colonization surveillance program. It is  not intended to diagnose MRSA infection nor to guide or monitor treatment for MRSA infections.       Radiology Studies: No results found.    Scheduled Meds: . allopurinol  200 mg Oral Daily  . aspirin EC  81 mg Oral Daily  . folic acid  1 mg Oral Daily  . methylPREDNISolone (SOLU-MEDROL) injection  125 mg Intravenous Once  . metoprolol tartrate  100 mg Oral BID  . multivitamin with minerals  1 tablet Oral Daily  . pantoprazole  40 mg Oral Q1200  . pneumococcal 23 valent vaccine  0.5 mL Intramuscular Tomorrow-1000  . thiamine  100 mg Oral Daily   Continuous Infusions: . cefTRIAXone (ROCEPHIN)  IV Stopped (09/27/17 2243)  . diltiazem (CARDIZEM) infusion 5 mg/hr (09/28/17 0736)  . heparin 800 Units/hr (09/28/17 0300)     LOS: 2 days    Time spent: Total of 35 minutes spent with pt, greater than 50% of which was spent in discussion of  treatment, counseling and coordination of care    Chipper Oman, MD Pager: Text Page via [email or web address]   If 7PM-7AM, please contact night-coverage [email or web address] 09/28/2017, 8:33 AM

## 2017-09-28 NOTE — Progress Notes (Signed)
ANTICOAGULATION CONSULT NOTE - Follow Up Consult  Pharmacy Consult for heparin Indication: atrial fibrillation  Labs: Recent Labs    09/26/17 0142 09/26/17 0151 09/26/17 0507 09/26/17 1844  09/26/17 2053 09/27/17 0813 09/27/17 1740 09/28/17 0155  HGB 14.4 14.3  --   --   --   --  12.5*  --  11.5*  HCT 41.6 42.0  --   --   --   --  36.7*  --  32.8*  PLT 133*  --   --   --   --   --  65*  --  49*  APTT  --   --   --   --    < > >200* >200* 98* 64*  LABPROT 15.7*  --   --   --   --   --   --   --   --   INR 1.26  --   --   --   --   --   --   --   --   HEPARINUNFRC  --   --   --  >2.20*  --  >2.20* 2.10*  --   --   CREATININE 1.13 1.30*  --   --   --   --  1.00  --   --   TROPONINI <0.03  --  <0.03  --   --   --   --   --   --    < > = values in this interval not displayed.    Assessment: 76yo male now below goal on heparin after one PTT at goal.  Goal of Therapy:  aPTT 66-102 seconds   Plan:  Will increase heparin gtt slightly to 800 units/hr and check PTT in 6-8hr.  Wynona Neat, PharmD, BCPS  09/28/2017,2:53 AM

## 2017-09-28 NOTE — Progress Notes (Addendum)
ANTICOAGULATION CONSULT NOTE - Follow Up Consult  Pharmacy Consult for Heparin Indication: atrial fibrillation  No Known Allergies  Patient Measurements: Height: 5\' 9"  (175.3 cm) Weight: 180 lb (81.6 kg)(per pt report ) IBW/kg (Calculated) : 70.7 Heparin Dosing Weight: 81.6 kg  Vital Signs: Temp: 98.2 F (36.8 C) (11/16 1130) Temp Source: Axillary (11/16 1130) BP: 127/84 (11/16 0900) Pulse Rate: 94 (11/16 0900)  Labs: Recent Labs    09/26/17 0142 09/26/17 0151 09/26/17 0507  09/26/17 2053 09/27/17 0813 09/27/17 1740 09/28/17 0155 09/28/17 0951  HGB 14.4 14.3  --   --   --  12.5*  --  11.5*  --   HCT 41.6 42.0  --   --   --  36.7*  --  32.8*  --   PLT 133*  --   --   --   --  65*  --  49*  --   APTT  --   --   --    < > >200* >200* 98* 64* 58*  LABPROT 15.7*  --   --   --   --   --   --   --   --   INR 1.26  --   --   --   --   --   --   --   --   HEPARINUNFRC  --   --   --    < > >2.20* 2.10*  --  0.98*  --   CREATININE 1.13 1.30*  --   --   --  1.00  --  0.92  --   TROPONINI <0.03  --  <0.03  --   --   --   --   --   --    < > = values in this interval not displayed.    Estimated Creatinine Clearance: 66.2 mL/min (by C-G formula based on SCr of 0.92 mg/dL).  Medications: Heparin @ 800 units/hr  Assessment:  76 yo male on Eliquis PTA for afib, with last dose 11/13 at 9 AM per patient report. Pharmacy consulted to dose heparin while eliquis on hold for any possible surgical intervention.   APTT remains subtherapeutic at 58 seconds, decreased after rate increase this AM. Heparin level remains elevated due to Eliquis. Hg down 11.5, plt down to 49. No bleeding or IV line issues noted per discussion with RN.  Goal of Therapy:  Heparin level 0.3-0.7 units/ml  APTT 66-102 Monitor platelets by anticoagulation protocol: Yes   Plan:  -Increase heparin to 950 units/hr -Check 8 hour aPTT -Daily heparin level/aPTT/CBC -Monitor for s/sx bleeding -Watch platelets  closely, may need to switch to a [place], PharmD, BCPS  Clinical Pharmacist Rx Phone # for today: [phone] After 3:30PM, please call Main Rx: [id number] 09/28/2017 11:37 AM    ADDENDUM:  Pharmacy consulted to dose DTI for r/o HIT - to start bivalirudin with elevated liver enzymes. MD concerned with platelet drop. 4T score ~3 - low probability of HIT. No bleed documented. CrCl>60.  Plan: D/c heparin Start Bivalirudin 0.15mg /kg/hr 2hr aPTT after start until therapeutic x 2; then daily aPTT Monitor daily CBC, for s/sx bleeding F/u HIT Ab results   Elicia Lamp, PharmD, BCPS Clinical Pharmacist Rx Phone # for today: [phone] After 3:30PM, please call Main Rx: [phone] 09/28/2017 1:21 PM

## 2017-09-29 ENCOUNTER — Inpatient Hospital Stay (HOSPITAL_COMMUNITY): Payer: Medicare Other

## 2017-09-29 DIAGNOSIS — R609 Edema, unspecified: Secondary | ICD-10-CM

## 2017-09-29 LAB — CBC
HCT: 33.9 % — ABNORMAL LOW (ref 39.0–52.0)
Hemoglobin: 11.7 g/dL — ABNORMAL LOW (ref 13.0–17.0)
MCH: 35.1 pg — ABNORMAL HIGH (ref 26.0–34.0)
MCHC: 34.5 g/dL (ref 30.0–36.0)
MCV: 101.8 fL — AB (ref 78.0–100.0)
Platelets: 66 10*3/uL — ABNORMAL LOW (ref 150–400)
RBC: 3.33 MIL/uL — ABNORMAL LOW (ref 4.22–5.81)
RDW: 12.9 % (ref 11.5–15.5)
WBC: 8.9 10*3/uL (ref 4.0–10.5)

## 2017-09-29 LAB — HEPATITIS PANEL, ACUTE
Hep A IgM: NEGATIVE
Hep B C IgM: NEGATIVE
Hepatitis B Surface Ag: NEGATIVE

## 2017-09-29 LAB — COMPREHENSIVE METABOLIC PANEL
ALBUMIN: 2.5 g/dL — AB (ref 3.5–5.0)
ALT: 79 U/L — ABNORMAL HIGH (ref 17–63)
ANION GAP: 10 (ref 5–15)
AST: 85 U/L — ABNORMAL HIGH (ref 15–41)
Alkaline Phosphatase: 129 U/L — ABNORMAL HIGH (ref 38–126)
BILIRUBIN TOTAL: 4 mg/dL — AB (ref 0.3–1.2)
BUN: 23 mg/dL — ABNORMAL HIGH (ref 6–20)
CO2: 24 mmol/L (ref 22–32)
Calcium: 7.3 mg/dL — ABNORMAL LOW (ref 8.9–10.3)
Chloride: 102 mmol/L (ref 101–111)
Creatinine, Ser: 0.93 mg/dL (ref 0.61–1.24)
GFR calc Af Amer: >60 mL/min (ref >60)
GFR calc non Af Amer: >60 mL/min (ref >60)
GLUCOSE: 219 mg/dL — AB (ref 65–99)
POTASSIUM: 3.4 mmol/L — AB (ref 3.5–5.1)
SODIUM: 136 mmol/L (ref 135–145)
TOTAL PROTEIN: 5.4 g/dL — AB (ref 6.5–8.1)

## 2017-09-29 LAB — APTT: APTT: 75 s — AB (ref 24–36)

## 2017-09-29 LAB — MAGNESIUM: MAGNESIUM: 1.7 mg/dL (ref 1.7–2.4)

## 2017-09-29 LAB — BRAIN NATRIURETIC PEPTIDE: B Natriuretic Peptide: 611.9 pg/mL — ABNORMAL HIGH (ref 0.0–100.0)

## 2017-09-29 LAB — HEPARIN INDUCED PLATELET AB (HIT ANTIBODY): Heparin Induced Plt Ab: 0.143 OD (ref 0.000–0.400)

## 2017-09-29 MED ORDER — POTASSIUM CHLORIDE CRYS ER 20 MEQ PO TBCR
40.0000 meq | EXTENDED_RELEASE_TABLET | Freq: Once | ORAL | Status: AC
  Administered 2017-09-29: 40 meq via ORAL
  Filled 2017-09-29: qty 2

## 2017-09-29 MED ORDER — FUROSEMIDE 10 MG/ML IJ SOLN
40.0000 mg | Freq: Once | INTRAMUSCULAR | Status: AC
  Administered 2017-09-29: 40 mg via INTRAVENOUS
  Filled 2017-09-29: qty 4

## 2017-09-29 NOTE — Progress Notes (Signed)
Verified rate of angiomax with pharmacy. Took pt off venti mask and placed on 5L nasal cannula so pt could have breakfast and take am medic ations. Pt has slight expiratory wheeze. Pt is alert and oriented to self, place, circumstance and year. Pt denies pain at this time. No family members at bedside at this time

## 2017-09-29 NOTE — Progress Notes (Signed)
   09/29/17 0020  Urine Characteristics  Urinary Interventions Bladder scan  Bladder Scan Volume (mL) 268 mL    MD paged. Ordered for foley Catheter. Will continue to monitor.

## 2017-09-29 NOTE — Progress Notes (Signed)
VASCULAR LAB PRELIMINARY  PRELIMINARY  PRELIMINARY  PRELIMINARY  Left upper extremity venous duplex completed.    Preliminary report:  There is no DVT or SVT noted in the left upper extremity.   Mishael Haran, RVT 09/29/2017, 11:44 AM

## 2017-09-29 NOTE — Progress Notes (Signed)
ANTICOAGULATION CONSULT NOTE - Follow Up Consult  Pharmacy Consult for Bivalrudin Indication: atrial fibrillation (rule out HIT)  Allergies  Allergen Reactions  . Heparin     HIT lab pending 09/28/17    Patient Measurements: Height: 5\' 9"  (175.3 cm) Weight: 180 lb (81.6 kg)(per pt report ) IBW/kg (Calculated) : 70.7 Heparin Dosing Weight: 81.6 kg  Vital Signs: Temp: 97.9 F (36.6 C) (11/17 0700) Temp Source: Oral (11/17 0700) BP: 156/80 (11/17 0900) Pulse Rate: 85 (11/17 0900)  Labs: Recent Labs    09/26/17 2053  09/27/17 0813  09/28/17 0155  09/28/17 1544 09/28/17 1913 09/29/17 0215  HGB  --    < > 12.5*  --  11.5*  --   --   --  11.7*  HCT  --   --  36.7*  --  32.8*  --   --   --  33.9*  PLT  --   --  65*  --  49*  --   --   --  66*  APTT >200*  --  >200*   < > 64*   < > 63* 76* 75*  HEPARINUNFRC >2.20*  --  2.10*  --  0.98*  --   --   --   --   CREATININE  --   --  1.00  --  0.92  --   --   --  0.93   < > = values in this interval not displayed.    Estimated Creatinine Clearance: 65.5 mL/min (by C-G formula based on SCr of 0.93 mg/dL).  Medications: Bivalrudin @ 0.15 mg/kg/hr  Assessment:  76 yo male on Eliquis PTA for afib, with last dose 11/13 at 9 AM per patient report. Pharmacy was consulted to dose heparin while eliquis on hold for any possible surgical intervention. Platelets trended down with 4T score of ~3. Decision was made by MD to change heparin to bivalrudin (direct thrombin inhibitor) and check HIT panel.   Current aPTT is 75, therapeutic on bivalrudin 0.15 mg/kg/hr. No bleeding reported. Hgb stable 11s, pltc improved to 66.  HIT panel still ip  Goal of Therapy:  APTT 50-85 Monitor platelets by anticoagulation protocol: Yes   Plan:  Continue bivalrudin at 0.15 mg/kg/hr Daily aPTT Monitor daily CBC, for s/sx bleeding F/u HIT Ab results  Erin Hearing PharmD., BCPS Clinical Pharmacist Pager [phone] 09/29/2017 9:22 AM

## 2017-09-29 NOTE — Progress Notes (Signed)
[facility]                                                                              Patient Demographics  Samuel Flores, is a 76 y.o. male, DOB - 1941-10-24, BHA:[id number]  Admit date - 09/26/2017   Admitting Physician Etta Quill, DO  Outpatient Primary MD for the patient is [place], Modena Nunnery, MD  Outpatient specialists:   LOS - 3  days   Medical records reviewed and are as summarized below:    Chief Complaint  Patient presents with  . Chest Pain       Brief summary   Samuel Flores 76 y.o.malewith history ofdaily EtOH use, CAD, gout, and A.Fib on eliquis who presented to the ED with acute onset central chest and epigastric pain. On EMS arrival ASA and NTG given and SBP dropped to 90s. In the ED Trop was negative, EKG was w/o acute changes, and CTa demonstrated no dissection nor PE but did show mild inflamation around the CBD and gallbladder. RUQ [place] was unremarkable. Patient was found to have epigastric and RUQ tenderness. Lipase neg. AST 192, ALT 80, Bili 2.7   Assessment & Plan    Principal Problem:   Sepsis ([place]), severe due to Klebsiella bacteremia, LLL pneumonia -Suspected from pneumonia, chest x-ray 11/16 showed airspace consolidation consistent with pneumonia left lower lobe with left pleural effusion -CT abdomen with possible cholecystitis, ultrasound showed no gallstones or wall thickening, no sonographic Murphy sign -Continue IV Rocephin, blood cultures 11/16 so far negative   Active Problems: Acute metabolic encephalopathy -Medication versus suspected HIT thrombosis, -CT head showed hypoattenuation in the left pons possible artifact however recommended MRI.  Follow MRI of the brain to rule out CVA. 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 -will give 1 dose of IV Lasix 40 mg x1      Elevated LFTs -Likely due to sepsis, hypotension and hypoperfusion, improving   Code Status: Full CODE STATUS DVT Prophylaxis: Angiomax Family Communication: Discussed in detail with the patient, all imaging results, lab results explained to the patient   Disposition Plan:   Time Spent in minutes   35 minutes  Procedures:    Consultants:   None  Antimicrobials:   IV Rocephin 11/14>   Medications  Scheduled Meds: . allopurinol  200 mg Oral Daily  . aspirin EC  81 mg Oral  Daily  . chlorhexidine  15 mL Mouth Rinse BID  . folic acid  1 mg Oral Daily  . mouth rinse  15 mL Mouth Rinse q12n4p  . metoprolol tartrate  5 mg Intravenous Q6H  . multivitamin with minerals  1 tablet Oral Daily  . pantoprazole  40 mg Oral Q1200  . pneumococcal 23 valent vaccine  0.5 mL Intramuscular Tomorrow-1000  . sodium chloride flush  3 mL Intravenous Q12H  . thiamine  100 mg Oral Daily   Continuous Infusions: . sodium chloride    . bivalirudin (ANGIOMAX) infusion 0.5 mg/mL (Non-ACS indications) 0.15 mg/kg/hr (09/29/17 0900)  . cefTRIAXone (ROCEPHIN)  IV Stopped (09/28/17 2258)  . diltiazem (CARDIZEM) infusion 10 mg/hr (09/29/17 0900)   PRN Meds:.sodium chloride, acetaminophen **OR** acetaminophen, albuterol, fentaNYL (SUBLIMAZE)  injection, gi cocktail, LORazepam, ondansetron **OR** ondansetron (ZOFRAN) IV, sodium chloride, sodium chloride flush   Antibiotics   Anti-infectives (From admission, onward)   Start     Dose/Rate Route Frequency Ordered Stop   09/26/17 2200  cefTRIAXone (ROCEPHIN) 2 g in dextrose 5 % 50 mL IVPB     2 g 100 mL/hr over 30 Minutes Intravenous Every 24 hours 09/26/17 2000     09/26/17 1200  piperacillin-tazobactam (ZOSYN) IVPB 3.375 g  Status:  Discontinued     3.375 g 12.5 mL/hr over 240 Minutes Intravenous Every 8 hours 09/26/17 0449 09/26/17 1958   09/26/17 0400  piperacillin-tazobactam (ZOSYN) IVPB 3.375 g     3.375 g 100 mL/hr over 30 Minutes Intravenous  Once 09/26/17 0353 09/26/17 0554        Subjective:   Samuel Flores was seen and examined today.  Alert and oriented however still short of breath on Ventimask.  Patient denies dizziness, chest pain, abdominal pain, N/V/D/C, new weakness, numbess, tingling.  No fevers   Objective:   Vitals:   09/29/17 0500 09/29/17 0700 09/29/17 0800 09/29/17 0900  BP: (!) 144/75  (!) 150/84 (!) 156/80  Pulse: 97  88 85  Resp: (!) 24  (!) 32 (!) 27  Temp:  97.9 F (36.6 C)    TempSrc:  Oral    SpO2: 97%  97% 95%  Weight:      Height:        Intake/Output Summary (Last 24 hours) at 09/29/2017 0952 Last data filed at 09/29/2017 0900 Gross per 24 hour  Intake 777.23 ml  Output 2150 ml  Net -1372.77 ml     Wt Readings from Last 3 Encounters:  09/26/17 81.6 kg (180 lb)  04/25/17 82.1 kg (181 lb)  02/27/17 83.5 kg (184 lb)     Exam  General: Alert and oriented x 3, NAD, on Ventimask  Eyes: PERRLA, EOMI, Anicteric Sclera,  HEENT:    Cardiovascular: S1 S2 auscultated, no rubs, murmurs or gallops. Regular rate and rhythm.  Respiratory: Decreased breath sound at the bases  Gastrointestinal: Soft, nontender, nondistended, + bowel sounds  Ext: no pedal edema bilaterally  Neuro: AAOx3, Cr N's II- XII. Strength 5/5  upper and lower extremities bilaterally, speech clear, sensations grossly intact  Musculoskeletal: No digital cyanosis, clubbing  Skin: No rashes  Psych: Normal affect and demeanor, alert and oriented x3    Data Reviewed:  I have personally reviewed following labs and imaging studies  Micro Results Recent Results (from the past 240 hour(s))  Blood culture (routine x 2)     Status: Abnormal   Collection Time: 09/26/17  5:00 AM  Result Value Ref Range Status  Specimen Description BLOOD RIGHT ANTECUBITAL  Final   Special Requests   Final    BOTTLES DRAWN AEROBIC AND ANAEROBIC Blood Culture results may not be optimal due to an excessive volume of blood received in culture bottles   Culture  Setup Time   Final    GRAM NEGATIVE RODS IN BOTH AEROBIC AND ANAEROBIC BOTTLES CRITICAL RESULT CALLED TO, READ BACK BY AND VERIFIED WITH: Mardella Layman PHARMD 1941 09/26/17 A BROWNING    Culture (A)  Final    KLEBSIELLA PNEUMONIAE SUSCEPTIBILITIES PERFORMED ON PREVIOUS CULTURE WITHIN THE LAST 5 DAYS.    Report Status 09/28/2017 FINAL  Final  Blood culture (routine x 2)     Status: Abnormal   Collection Time: 09/26/17  5:10 AM  Result Value Ref Range Status   Specimen Description BLOOD RIGHT HAND  Final   Special Requests   Final    BOTTLES DRAWN AEROBIC AND ANAEROBIC Blood Culture results may not be optimal due to an excessive volume of blood received in culture bottles   Culture  Setup Time   Final    GRAM NEGATIVE RODS IN BOTH AEROBIC AND ANAEROBIC BOTTLES CRITICAL RESULT CALLED TO, READ BACK BY AND VERIFIED WITH: Mardella Layman PHARMD 1941 09/26/17 A BROWNING    Culture KLEBSIELLA PNEUMONIAE (A)  Final   Report Status 09/28/2017 FINAL  Final   Organism ID, Bacteria KLEBSIELLA PNEUMONIAE  Final      Susceptibility   Klebsiella pneumoniae - MIC*    AMPICILLIN RESISTANT Resistant     CEFAZOLIN <=4 SENSITIVE Sensitive     CEFEPIME <=1 SENSITIVE Sensitive     CEFTAZIDIME <=1 SENSITIVE Sensitive      CEFTRIAXONE <=1 SENSITIVE Sensitive     CIPROFLOXACIN <=0.25 SENSITIVE Sensitive     GENTAMICIN <=1 SENSITIVE Sensitive     IMIPENEM <=0.25 SENSITIVE Sensitive     TRIMETH/SULFA <=20 SENSITIVE Sensitive     AMPICILLIN/SULBACTAM <=2 SENSITIVE Sensitive     PIP/TAZO <=4 SENSITIVE Sensitive     Extended ESBL NEGATIVE Sensitive     * KLEBSIELLA PNEUMONIAE  Blood Culture ID Panel (Reflexed)     Status: Abnormal   Collection Time: 09/26/17  5:10 AM  Result Value Ref Range Status   Enterococcus species NOT DETECTED NOT DETECTED Final   Listeria monocytogenes NOT DETECTED NOT DETECTED Final   Staphylococcus species NOT DETECTED NOT DETECTED Final   Staphylococcus aureus NOT DETECTED NOT DETECTED Final   Streptococcus species NOT DETECTED NOT DETECTED Final   Streptococcus agalactiae NOT DETECTED NOT DETECTED Final   Streptococcus pneumoniae NOT DETECTED NOT DETECTED Final   Streptococcus pyogenes NOT DETECTED NOT DETECTED Final   Acinetobacter baumannii NOT DETECTED NOT DETECTED Final   Enterobacteriaceae species DETECTED (A) NOT DETECTED Final    Comment: Enterobacteriaceae represent a large family of gram-negative bacteria, not a single organism. CRITICAL RESULT CALLED TO, READ BACK BY AND VERIFIED WITH: Mardella Layman PHARMD [id number] 09/26/17 A BROWNING    Enterobacter cloacae complex NOT DETECTED NOT DETECTED Final   Escherichia coli NOT DETECTED NOT DETECTED Final   Klebsiella oxytoca NOT DETECTED NOT DETECTED Final   Klebsiella pneumoniae DETECTED (A) NOT DETECTED Final    Comment: CRITICAL RESULT CALLED TO, READ BACK BY AND VERIFIED WITH: Mardella Layman PHARMD 1941 09/26/17 A BROWNING    Proteus species NOT DETECTED NOT DETECTED Final   Serratia marcescens NOT DETECTED NOT DETECTED Final   Carbapenem resistance NOT DETECTED NOT DETECTED Final   Haemophilus influenzae NOT DETECTED NOT DETECTED Final  Neisseria meningitidis NOT DETECTED NOT DETECTED Final   Pseudomonas aeruginosa NOT DETECTED  NOT DETECTED Final   Candida albicans NOT DETECTED NOT DETECTED Final   Candida glabrata NOT DETECTED NOT DETECTED Final   Candida krusei NOT DETECTED NOT DETECTED Final   Candida parapsilosis NOT DETECTED NOT DETECTED Final   Candida tropicalis NOT DETECTED NOT DETECTED Final  Culture, Urine     Status: None   Collection Time: 09/26/17  4:44 PM  Result Value Ref Range Status   Specimen Description URINE, CLEAN CATCH  Final   Special Requests NONE  Final   Culture NO GROWTH  Final   Report Status 09/27/2017 FINAL  Final  MRSA PCR Screening     Status: None   Collection Time: 09/26/17  8:42 PM  Result Value Ref Range Status   MRSA by PCR NEGATIVE NEGATIVE Final    Comment:        The GeneXpert MRSA Assay (FDA approved for NASAL specimens only), is one component of a comprehensive MRSA colonization surveillance program. It is not intended to diagnose MRSA infection nor to guide or monitor treatment for MRSA infections.     Radiology Reports Ct Head Wo Contrast  Result Date: 09/28/2017 CLINICAL DATA:  Altered mental status EXAM: CT HEAD WITHOUT CONTRAST TECHNIQUE: Contiguous axial images were obtained from the base of the skull through the vertex without intravenous contrast. COMPARISON:  Head CT 04/12/2011 FINDINGS: Brain: No mass lesion, intraparenchymal hemorrhage or extra-axial collection. There is a focal area of hypoattenuation within the anterior left pons on a single image. Brain parenchyma and CSF-containing spaces are otherwise normal for age. Vascular: No hyperdense vessel or unexpected calcification. Skull: Normal visualized skull base, calvarium and extracranial soft tissues. Sinuses/Orbits: No sinus fluid levels or advanced mucosal thickening. No mastoid effusion. Normal orbits. IMPRESSION: 1. Hypoattenuation in the left pons. This is an area that is prone to artifact, but the appearance is somewhat atypical and MRI should be considered to exclude an ischemic event at  this location. 2. No intracranial hemorrhage or mass effect. Electronically Signed   By: Ulyses Jarred M.D.   On: 09/28/2017 20:13   Dg Chest Port 1 View  Result Date: 09/28/2017 CLINICAL DATA:  Shortness of Breath EXAM: PORTABLE CHEST 1 VIEW COMPARISON:  Chest radiograph September 26, 2017 and chest CT September 26, 2017 FINDINGS: There is airspace consolidation in the left lower lobe with left pleural effusion. Lungs elsewhere are clear. Heart is upper normal in size with pulmonary vascularity are normal. No adenopathy. There is aortic atherosclerosis. No adenopathy. There is postoperative change in the lower cervical spine region. IMPRESSION: Airspace consolidation consistent with pneumonia left lower lobe with left pleural effusion. Right lung clear. Stable cardiac silhouette. There is aortic atherosclerosis. Aortic Atherosclerosis (ICD10-I70.0). Electronically Signed   By: Lowella Grip III M.D.   On: 09/28/2017 09:46   Dg Chest Portable 1 View  Result Date: 09/26/2017 CLINICAL DATA:  Acute onset of substernal chest pain.  Nausea. EXAM: PORTABLE CHEST 1 VIEW COMPARISON:  Chest radiograph performed 06/02/2013 FINDINGS: The lungs are mildly hypoexpanded. Mild left basilar airspace opacity likely reflects atelectasis. No pleural effusion or pneumothorax is seen. The cardiomediastinal silhouette is borderline normal in size. No acute osseous abnormalities are identified. Cervical spinal fusion hardware is noted. IMPRESSION: Lungs mildly hypoexpanded. Mild left basilar airspace opacity likely reflects atelectasis. Electronically Signed   By: Garald Balding M.D.   On: 09/26/2017 02:22   Ct Angio Chest/abd/pel For Dissection W And/or  Wo Contrast  Result Date: 09/26/2017 CLINICAL DATA:  Acute onset of substernal chest pain. Nausea and left-sided abdominal pain. EXAM: CT ANGIOGRAPHY CHEST, ABDOMEN AND PELVIS TECHNIQUE: Multidetector CT imaging through the chest, abdomen and pelvis was performed using  the standard protocol during bolus administration of intravenous contrast. Multiplanar reconstructed images and MIPs were obtained and reviewed to evaluate the vascular anatomy. CONTRAST:  141mL ISOVUE-370 IOPAMIDOL (ISOVUE-370) INJECTION 76% COMPARISON:  Chest radiograph performed earlier today at 2:34 a.m., and right upper quadrant ultrasound performed 04/29/2014 FINDINGS: CTA CHEST FINDINGS Cardiovascular: There is no evidence of aortic dissection. There is no evidence of aneurysmal dilatation. Mild calcification is seen along the aortic arch and proximal left subclavian artery. There is no evidence of significant pulmonary embolus. The heart remains normal in size. Scattered coronary artery calcifications are seen. Mediastinum/Nodes: The mediastinum is unremarkable in appearance. No mediastinal lymphadenopathy is seen. No pericardial effusion is identified. The thyroid gland is unremarkable. No axillary lymphadenopathy is appreciated. Lungs/Pleura: Minimal bibasilar atelectasis is noted. No pleural effusion or pneumothorax is seen. No masses are identified. Musculoskeletal: No acute osseous abnormalities are identified. Anterior cervical spinal fusion hardware is noted. The visualized musculature is unremarkable in appearance. Review of the MIP images confirms the above findings. CTA ABDOMEN AND PELVIS FINDINGS VASCULAR Aorta: There is no evidence of aortic dissection. There is no evidence of aneurysmal dilatation. Mild calcification is seen along the abdominal aorta and its branches. Celiac: The celiac trunk remains patent. SMA: The superior mesenteric artery remains patent, with mild calcification at the origin of the superior mesenteric artery. Renals: The renal arteries appear patent bilaterally, with mild calcification at the origins of both renal arteries. IMA: The inferior mesenteric artery is unremarkable in appearance. Inflow: Mild scattered calcification is seen along the common and internal iliac  arteries bilaterally, and along the left common femoral artery. Veins: The visualized venous structures are grossly unremarkable. Review of the MIP images confirms the above findings. NON-VASCULAR Hepatobiliary: Mild soft tissue inflammation is noted about the common bile duct and base of the gallbladder. This raises question for mild cholecystitis. The liver is unremarkable in appearance. Pancreas: The pancreas is within normal limits. Spleen: The spleen is unremarkable in appearance. Adrenals/Urinary Tract: The adrenal glands are unremarkable in appearance. Nonspecific perinephric stranding is noted bilaterally. Mild bilateral renal scarring is noted. There is no evidence hydronephrosis. No renal or ureteral stones are identified. Stomach/Bowel: The stomach is unremarkable in appearance. The small bowel is within normal limits. The appendix is normal in caliber, without evidence of appendicitis. Prominent diverticula are noted at the cecum, and a few additional scattered diverticula are noted along the remainder of the colon. Mild wall thickening along the ascending colon may simply reflect intraluminal contents, though a mild infectious or inflammatory process might have a similar appearance. Lymphatic: No retroperitoneal or pelvic sidewall lymphadenopathy is seen. Reproductive: The bladder is mildly distended and grossly unremarkable. The patient is status post prostatectomy. Other: No additional soft tissue abnormalities are seen. Musculoskeletal: No acute osseous abnormalities are identified. The visualized musculature is unremarkable in appearance. Review of the MIP images confirms the above findings. IMPRESSION: 1. No evidence of aortic dissection. No evidence of aneurysmal dilatation. 2. No evidence of significant pulmonary embolus. 3. Mild soft tissue inflammation about the common bile duct and base of the gallbladder. This raises question for mild cholecystitis, though it may remain within normal limits.  Would correlate with the patient's symptoms. 4. Mild wall thickening along the ascending colon may  simply reflect intraluminal contents, though a mild infectious or inflammatory process might have a similar appearance. 5. Aortic Atherosclerosis (ICD10-I70.0). 6. Scattered coronary artery calcifications. 7. Prominent diverticula at the cecum, and scattered diverticula along the remainder of the colon. Electronically Signed   By: Garald Balding M.D.   On: 09/26/2017 03:27   US Abdomen Limited Ruq  Result Date: 09/26/2017 CLINICAL DATA:  Acute onset of right upper quadrant abdominal pain. EXAM: ULTRASOUND ABDOMEN LIMITED RIGHT UPPER QUADRANT COMPARISON:  Right upper quadrant ultrasound performed 04/29/2014 FINDINGS: Gallbladder: No gallstones or wall thickening visualized. No sonographic Murphy sign noted by sonographer. Common bile duct: Diameter: 0.3 cm, within normal limits in caliber. Liver: No focal lesion identified. Within normal limits in parenchymal echogenicity. Portal vein is patent on color Doppler imaging with normal direction of blood flow towards the liver. IMPRESSION: Unremarkable ultrasound of the right upper quadrant. Electronically Signed   By: Garald Balding M.D.   On: 09/26/2017 04:44    Lab Data:  CBC: Recent Labs  Lab 09/26/17 0142 09/26/17 0151 09/27/17 0813 09/28/17 0155 09/29/17 0215  WBC 14.5*  --  13.7* 10.4 8.9  NEUTROABS 12.3*  --   --   --   --   HGB 14.4 14.3 12.5* 11.5* 11.7*  HCT 41.6 42.0 36.7* 32.8* 33.9*  MCV 101.7*  --  104.0* 103.5* 101.8*  PLT 133*  --  65* 49* 66*   Basic Metabolic Panel: Recent Labs  Lab 09/26/17 0142 09/26/17 0151 09/27/17 0813 09/28/17 0155 09/28/17 0951 09/29/17 0215  NA 137 139 134* 134*  --  136  K 4.5 4.5 3.6 4.2  --  3.4*  CL 103 101 104 105  --  102  CO2 23  --  21* 22  --  24  GLUCOSE 136* 136* 130* 113*  --  219*  BUN 19 23* 14 18  --  23*  CREATININE 1.13 1.30* 1.00 0.92  --  0.93  CALCIUM 8.0*  --  6.9* 6.8*   --  7.3*  MG  --   --   --   --  1.5* 1.7   GFR: Estimated Creatinine Clearance: 65.5 mL/min (by C-G formula based on SCr of 0.93 mg/dL). Liver Function Tests: Recent Labs  Lab 09/26/17 0142 09/27/17 0813 09/28/17 0155 09/29/17 0215  AST 192* 155*  160* 128* 85*  ALT 80* 106*  108* 92* 79*  ALKPHOS 175* 135*  138* 122 129*  BILITOT 2.7* 7.8*  8.1* 7.2* 4.0*  PROT 5.8* 5.3*  5.0* 4.7* 5.4*  ALBUMIN 3.0* 2.5*  2.5* 2.3* 2.5*   Recent Labs  Lab 09/26/17 0142  LIPASE 41   No results for input(s): AMMONIA in the last 168 hours. Coagulation Profile: Recent Labs  Lab 09/26/17 0142  INR 1.26   Cardiac Enzymes: Recent Labs  Lab 09/26/17 0142 09/26/17 0507  TROPONINI <0.03 <0.03   BNP (last 3 results) No results for input(s): PROBNP in the last 8760 hours. HbA1C: No results for input(s): HGBA1C in the last 72 hours. CBG: No results for input(s): GLUCAP in the last 168 hours. Lipid Profile: No results for input(s): CHOL, HDL, LDLCALC, TRIG, CHOLHDL, LDLDIRECT in the last 72 hours. Thyroid Function Tests: No results for input(s): TSH, T4TOTAL, FREET4, T3FREE, THYROIDAB in the last 72 hours. Anemia Panel: Recent Labs    09/28/17 0155  VITAMINB12 236  FOLATE 10.7   Urine analysis:    Component Value Date/Time   COLORURINE AMBER (A) 09/26/2017 1632  APPEARANCEUR CLEAR 09/26/2017 1632   LABSPEC >1.046 (H) 09/26/2017 1632   PHURINE 5.0 09/26/2017 1632   GLUCOSEU NEGATIVE 09/26/2017 1632   HGBUR NEGATIVE 09/26/2017 1632   BILIRUBINUR SMALL (A) 09/26/2017 1632   KETONESUR 5 (A) 09/26/2017 1632   PROTEINUR NEGATIVE 09/26/2017 1632   UROBILINOGEN 1.0 01/01/2013 1626   NITRITE NEGATIVE 09/26/2017 1632   LEUKOCYTESUR NEGATIVE 09/26/2017 1632     Liam Bossman M.D. [facility] 09/29/2017, 9:52 AM  Pager: [phone] Between 7am to 7pm - call Pager - [phone]  After 7pm go to [email or web address] - password [email or web address]  Call night coverage person covering after  7pm

## 2017-09-30 ENCOUNTER — Inpatient Hospital Stay (HOSPITAL_COMMUNITY): Payer: Medicare Other

## 2017-09-30 LAB — CBC
HCT: 32.9 % — ABNORMAL LOW (ref 39.0–52.0)
HEMOGLOBIN: 11.3 g/dL — AB (ref 13.0–17.0)
MCH: 35 pg — AB (ref 26.0–34.0)
MCHC: 34.3 g/dL (ref 30.0–36.0)
MCV: 101.9 fL — AB (ref 78.0–100.0)
PLATELETS: 84 10*3/uL — AB (ref 150–400)
RBC: 3.23 MIL/uL — ABNORMAL LOW (ref 4.22–5.81)
RDW: 13 % (ref 11.5–15.5)
WBC: 13.1 10*3/uL — ABNORMAL HIGH (ref 4.0–10.5)

## 2017-09-30 LAB — BASIC METABOLIC PANEL
ANION GAP: 8 (ref 5–15)
BUN: 20 mg/dL (ref 6–20)
CO2: 26 mmol/L (ref 22–32)
Calcium: 7.2 mg/dL — ABNORMAL LOW (ref 8.9–10.3)
Chloride: 102 mmol/L (ref 101–111)
Creatinine, Ser: 0.72 mg/dL (ref 0.61–1.24)
GFR calc Af Amer: >60 mL/min (ref >60)
GLUCOSE: 139 mg/dL — AB (ref 65–99)
POTASSIUM: 4.1 mmol/L (ref 3.5–5.1)
Sodium: 136 mmol/L (ref 135–145)

## 2017-09-30 LAB — APTT: APTT: 51 s — AB (ref 24–36)

## 2017-09-30 MED ORDER — IPRATROPIUM BROMIDE 0.02 % IN SOLN: 0.5000 mg | Freq: Four times a day (QID) | RESPIRATORY_TRACT | Status: DC

## 2017-09-30 MED ORDER — LEVALBUTEROL HCL 0.63 MG/3ML IN NEBU
0.6300 mg | INHALATION_SOLUTION | Freq: Four times a day (QID) | RESPIRATORY_TRACT | Status: DC
  Administered 2017-09-30: 0.63 mg via RESPIRATORY_TRACT
  Filled 2017-09-30: qty 3

## 2017-09-30 MED ORDER — ENSURE ENLIVE PO LIQD
237.0000 mL | Freq: Two times a day (BID) | ORAL | Status: DC
  Administered 2017-09-30 – 2017-10-06 (×8): 237 mL via ORAL

## 2017-09-30 MED ORDER — FUROSEMIDE 10 MG/ML IJ SOLN
40.0000 mg | Freq: Every day | INTRAMUSCULAR | Status: DC
  Administered 2017-09-30 – 2017-10-02 (×3): 40 mg via INTRAVENOUS
  Filled 2017-09-30 (×3): qty 4

## 2017-09-30 MED ORDER — DILTIAZEM LOAD VIA INFUSION
10.0000 mg | Freq: Once | INTRAVENOUS | Status: AC
  Administered 2017-09-30: 10 mg via INTRAVENOUS
  Filled 2017-09-30: qty 10

## 2017-09-30 MED ORDER — LEVALBUTEROL HCL 0.63 MG/3ML IN NEBU
0.6300 mg | INHALATION_SOLUTION | Freq: Three times a day (TID) | RESPIRATORY_TRACT | Status: DC
  Administered 2017-09-30 – 2017-10-03 (×8): 0.63 mg via RESPIRATORY_TRACT
  Filled 2017-09-30 (×8): qty 3

## 2017-09-30 MED ORDER — IPRATROPIUM BROMIDE 0.02 % IN SOLN
0.5000 mg | Freq: Four times a day (QID) | RESPIRATORY_TRACT | Status: DC
  Administered 2017-09-30: 0.5 mg via RESPIRATORY_TRACT
  Filled 2017-09-30: qty 2.5

## 2017-09-30 MED ORDER — LEVALBUTEROL HCL 0.63 MG/3ML IN NEBU: 0.6300 mg | INHALATION_SOLUTION | Freq: Three times a day (TID) | RESPIRATORY_TRACT | Status: DC

## 2017-09-30 MED ORDER — PREDNISONE 20 MG PO TABS
40.0000 mg | ORAL_TABLET | Freq: Every day | ORAL | Status: DC
  Administered 2017-09-30 – 2017-10-04 (×5): 40 mg via ORAL
  Filled 2017-09-30 (×5): qty 2

## 2017-09-30 MED ORDER — DILTIAZEM HCL 60 MG PO TABS
60.0000 mg | ORAL_TABLET | Freq: Three times a day (TID) | ORAL | Status: DC
  Administered 2017-09-30: 60 mg via ORAL

## 2017-09-30 MED ORDER — LEVALBUTEROL HCL 0.63 MG/3ML IN NEBU
0.6300 mg | INHALATION_SOLUTION | RESPIRATORY_TRACT | Status: DC | PRN
  Administered 2017-09-30 – 2017-10-01 (×2): 0.63 mg via RESPIRATORY_TRACT
  Filled 2017-09-30 (×2): qty 3

## 2017-09-30 MED ORDER — LORAZEPAM 1 MG PO TABS
1.0000 mg | ORAL_TABLET | Freq: Three times a day (TID) | ORAL | Status: DC | PRN
  Administered 2017-09-30 (×2): 1 mg via ORAL
  Filled 2017-09-30 (×2): qty 1

## 2017-09-30 MED ORDER — DILTIAZEM HCL 60 MG PO TABS
60.0000 mg | ORAL_TABLET | Freq: Three times a day (TID) | ORAL | Status: DC
  Filled 2017-09-30: qty 1

## 2017-09-30 MED ORDER — IPRATROPIUM BROMIDE 0.02 % IN SOLN
0.5000 mg | Freq: Three times a day (TID) | RESPIRATORY_TRACT | Status: DC
  Administered 2017-09-30 – 2017-10-03 (×9): 0.5 mg via RESPIRATORY_TRACT
  Filled 2017-09-30 (×9): qty 2.5

## 2017-09-30 MED ORDER — DILTIAZEM HCL 100 MG IV SOLR
5.0000 mg/h | INTRAVENOUS | Status: DC
  Administered 2017-09-30 – 2017-10-02 (×4): 10 mg/h via INTRAVENOUS
  Filled 2017-09-30 (×6): qty 100

## 2017-09-30 NOTE — Evaluation (Addendum)
Clinical/Bedside Swallow Evaluation Patient Details  Name: Samuel Flores MRN: [id number] Date of Birth: Dec 29, 1940  Today's Date: 09/30/2017 Time: SLP Start Time (ACUTE ONLY): 48 SLP Stop Time (ACUTE ONLY): 1425 SLP Time Calculation (min) (ACUTE ONLY): 15 min  Past Medical History:  Past Medical History:  Diagnosis Date  . Alcohol abuse, daily use 08/18/2013  . Arthritis    osteoarthritis  . CAD (coronary artery disease)    Non obstructive  . Cancer [facility]) 1994   prostate  . Diastolic dysfunction Echo 2012   Grade 1  . Gout   . 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 Hx-TIA (transient ischemic attack) 2011  . Hypercholesteremia   . Hypertension    Past Surgical History:  Past Surgical History:  Procedure Laterality Date  . BURR HOLE FOR SUBDURAL HEMATOMA  03/2011  . CARDIOVERSION N/A 02/20/2017   Performed by Adrian Prows, MD at [facility] ENDOSCOPY  . EYE SURGERY Left 03/2009   cataract  . LEFT HEART CATHETERIZATION WITH CORONARY ANGIOGRAM N/A 01/02/2013   Performed by Laverda Page, MD at [facility] CATH LAB  . PROSTATE SURGERY  1994  . SPINE SURGERY     cervical   HPI:  Pt is a 76 y.o. male with PMH of EtOH use daily, CAD, gout, A.Fib rate controlled on eliquis. Patient presented to the ED 11/14with c/o central chest and epigastric pain, symptoms onset while resting at about 7:30pm. Pale on EMS arrival. ASA and NTG given and SBP dropped to 90s. Ongoing chest, epigastric pain, as well as nausea. Has Klebsiella pneumoniae bacteremia- unknown source. CXR showed Airspace consolidation consistent with pneumonia left lower lobe with left pleural effusion. Right lung clear. Had been on clear liquid diet; advanced to dysphagia 2 today. RN reported pt coughing at times during PO intake; seems to have more trouble with straws. Bedside swallow eval ordered.   Assessment / Plan / Recommendation Clinical Impression  Pt showed no overt s/s of aspiration during trials at bedside and no change in  vocal quality. Pt did appear SOB occasionally during evaluation; O2s  were 93-95% throughout. Pt admits to occasional coughing during meals at baseline; felt it had been ongoing for the past 4 years. Stated he avoids foods that are chewy. Respiratory status increases the pt's risk of aspiration. Recommend advancing diet to dysphagia 3/ thin liquids, meds whole with liquid, intermittent supervision to cue small bites/ sips ensure pt is upright. Also encouraged pt to pause and catch breath during meal as needed. Will f/u to ensure tolerance/ consider advancement. Provided education on findings/ recommendations and pt is in agreement.  SLP Visit Diagnosis: Dysphagia, unspecified (R13.10)    Aspiration Risk  Mild aspiration risk;Moderate aspiration risk    Diet Recommendation Dysphagia 3 (Mech soft);Thin liquid   Liquid Administration via: Cup;Straw Medication Administration: Whole meds with liquid Supervision: Patient able to self feed;Intermittent supervision to cue for compensatory strategies Compensations: Slow rate;Small sips/bites Postural Changes: Seated upright at 90 degrees;Remain upright for at least 30 minutes after po intake    Other  Recommendations Oral Care Recommendations: Oral care BID Other Recommendations: Clarify dietary restrictions   Follow up Recommendations Other (comment)(TBD)      Frequency and Duration min 1 x/week  1 week       Prognosis Prognosis for Safe Diet Advancement: Good      Swallow Study   General HPI: Pt is a 76 y.o. male with PMH of EtOH use daily, CAD, gout, A.Fib rate controlled on eliquis.  Patient presented to the ED 11/14with c/o central chest and epigastric pain, symptoms onset while resting at about 7:30pm. Pale on EMS arrival. ASA and NTG given and SBP dropped to 90s. Ongoing chest, epigastric pain, as well as nausea. Has Klebsiella pneumoniae bacteremia- unknown source. CXR showed Airspace consolidation consistent with pneumonia left lower  lobe with left pleural effusion. Right lung clear. Had been on clear liquid diet; advanced to dysphagia 2 today. RN reported pt coughing at times during PO intake; seems to have more trouble with straws. Bedside swallow eval ordered. Type of Study: Bedside Swallow Evaluation Previous Swallow Assessment: none in chart Diet Prior to this Study: Dysphagia 2 (chopped);Thin liquids Temperature Spikes Noted: No Respiratory Status: Room air History of Recent Intubation: No Behavior/Cognition: Alert;Cooperative;Pleasant mood Oral Cavity Assessment: Within Functional Limits Oral Cavity - Dentition: Adequate natural dentition Vision: Functional for self-feeding Self-Feeding Abilities: Able to feed self Patient Positioning: Upright in bed Baseline Vocal Quality: Normal Volitional Cough: Strong Volitional Swallow: Able to elicit    Oral/Motor/Sensory Function Overall Oral Motor/Sensory Function: Within functional limits   Ice Chips Ice chips: Not tested   Thin Liquid Thin Liquid: Within functional limits Other Comments: occasional SOB during trials    Nectar Thick Nectar Thick Liquid: Not tested   Honey Thick Honey Thick Liquid: Not tested   Puree Puree: Not tested   Solid   GO   Solid: Within functional limits        Kern Reap, MA, CCC-SLP 09/30/2017,2:28 PM [phone]

## 2017-09-30 NOTE — Progress Notes (Signed)
Pt sob, placed on 2liters nasal canula, heart rate increasing to 120's a-fib. Notified Dr Tana Coast, received orders for Cardizem and chest xray

## 2017-09-30 NOTE — Progress Notes (Addendum)
[facility]                                                                              Patient Demographics  Samuel Flores, is a 76 y.o. male, DOB - 03-15-1941, VHQ:[id number]  Admit date - 09/26/2017   Admitting Physician Etta Quill, DO  Outpatient Primary MD for the patient is [place], Modena Nunnery, MD  Outpatient specialists:   LOS - 4  days   Medical records reviewed and are as summarized below:    Chief Complaint  Patient presents with  . Chest Pain       Brief summary   Samuel Flores 76 y.o.malewith history ofdaily EtOH use, CAD, gout, and A.Fib on eliquis who presented to the ED with acute onset central chest and epigastric pain. On EMS arrival ASA and NTG given and SBP dropped to 90s. In the ED Trop was negative, EKG was w/o acute changes, and CTa demonstrated no dissection nor PE but did show mild inflamation around the CBD and gallbladder. RUQ [place] was unremarkable. Patient was found to have epigastric and RUQ tenderness. Lipase neg. AST 192, ALT 80, Bili 2.7   Assessment & Plan    Principal Problem:   Sepsis ([place]), severe due to Klebsiella bacteremia, LLL pneumonia -Suspected from pneumonia, chest x-ray 11/16 showed airspace consolidation consistent with pneumonia left lower lobe with left pleural effusion -CT abdomen with possible cholecystitis, ultrasound showed no gallstones or wall thickening, no sonographic Murphy sign -Continue IV Rocephin, blood cultures 11/16 so far negative -Advance diet to dysphagia 2, SLP evaluation today   Active Problems: Acute metabolic encephalopathy -improving. Medication versus 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  MRI of the brain negative for acute stroke. 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  Continue beta-blocker. Addendum Patient was started on oral Cardizem today however subsequently went back into the atrial fib with RVR.  Oral Cardizem discontinued.  Restart Cardizem drip portable chest x-ray,  - will consult cardiology .  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 -BNP elevated 611, wheezing bilaterally, placed on Xopenex, Atrovent.  Lasix 40 mg IV daily, prednisone -Shortness of breath is improving, 94% on 3 L nasal cannula    Elevated LFTs -Likely due to sepsis, hypotension and hypoperfusion, improving   Code Status: Full CODE STATUS DVT Prophylaxis: Angiomax Family Communication: Discussed in detail with the patient, all imaging results, lab results explained to the  patient   Disposition Plan:   Time Spent in minutes   25 minutes  Procedures:    Consultants:   None  Antimicrobials:   IV Rocephin 11/14>   Medications  Scheduled Meds: . allopurinol  200 mg Oral Daily  . aspirin EC  81 mg Oral Daily  . chlorhexidine  15 mL Mouth Rinse BID  . diltiazem  60 mg Oral Q8H  . feeding supplement (ENSURE ENLIVE)  237 mL Oral BID BM  . folic acid  1 mg Oral Daily  . furosemide  40 mg Intravenous Daily  . levalbuterol  0.63 mg Nebulization Q6H   And  . ipratropium  0.5 mg Nebulization Q6H  . mouth rinse  15 mL Mouth Rinse q12n4p  . metoprolol tartrate  5 mg Intravenous Q6H  . multivitamin with  minerals  1 tablet Oral Daily  . pantoprazole  40 mg Oral Q1200  . pneumococcal 23 valent vaccine  0.5 mL Intramuscular Tomorrow-1000  . predniSONE  40 mg Oral Q breakfast  . sodium chloride flush  3 mL Intravenous Q12H  . thiamine  100 mg Oral Daily   Continuous Infusions: . sodium chloride    . bivalirudin (ANGIOMAX) infusion 0.5 mg/mL (Non-ACS indications) 0.15 mg/kg/hr (09/30/17 0800)  . cefTRIAXone (ROCEPHIN)  IV Stopped (09/29/17 2227)  . diltiazem (CARDIZEM) infusion 10 mg/hr (09/30/17 0800)   PRN Meds:.sodium chloride, acetaminophen **OR** acetaminophen, fentaNYL (SUBLIMAZE) injection, gi cocktail, levalbuterol, LORazepam, ondansetron **OR** ondansetron (ZOFRAN) IV, sodium chloride, sodium chloride flush   Antibiotics   Anti-infectives (From admission, onward)   Start     Dose/Rate Route Frequency Ordered Stop   09/26/17 2200  cefTRIAXone (ROCEPHIN) 2 g in dextrose 5 % 50 mL IVPB     2 g 100 mL/hr over 30 Minutes Intravenous Every 24 hours 09/26/17 2000     09/26/17 1200  piperacillin-tazobactam (ZOSYN) IVPB 3.375 g  Status:  Discontinued     3.375 g 12.5 mL/hr over 240 Minutes Intravenous Every 8 hours 09/26/17 0449 09/26/17 1958   09/26/17 0400  piperacillin-tazobactam (ZOSYN) IVPB 3.375 g     3.375 g 100 mL/hr over 30 Minutes Intravenous  Once 09/26/17 0353 09/26/17 0554        Subjective:   [name] was seen and examined today.  Alert and oriented, overall improving, still on clear liquid diet.  States poor appetite.  Wheezing, no fevers.  Patient denies dizziness, chest pain, abdominal pain, N/V/D/C, new weakness, numbess, tingling.    Objective:   Vitals:   09/30/17 0200 09/30/17 0300 09/30/17 0700 09/30/17 0800  BP: 122/78 124/81 133/82 140/68  Pulse: 70 74 81 71  Resp: 18 (!) 26 (!) 27 (!) 23  Temp:  97.6 F (36.4 C) 97.7 F (36.5 C)   TempSrc:  Axillary Axillary   SpO2: 96% 95% 94% 94%  Weight:      Height:        Intake/Output Summary  (Last 24 hours) at 09/30/2017 1027 Last data filed at 09/30/2017 0800 Gross per 24 hour  Intake 1409 ml  Output 2200 ml  Net -791 ml     Wt Readings from Last 3 Encounters:  09/26/17 81.6 kg (180 lb)  04/25/17 82.1 kg (181 lb)  02/27/17 83.5 kg (184 lb)     Exam   General: Alert and oriented x 3, NAD  Eyes:   HEENT:  Atraumatic, normocephalic  Cardiovascular: S1 S2 clear, RRR. No pedal edema b/l  Respiratory: Bilateral expiratory wheezing  Gastrointestinal:  Soft, nontender, nondistended, + bowel sounds  Ext: no pedal edema bilaterally  Neuro: no new deficits  Musculoskeletal: No digital cyanosis, clubbing  Skin: No rashes  Psych: Normal affect and demeanor, alert and oriented x3    Data Reviewed:  I have personally reviewed following labs and imaging studies  Micro Results Recent Results (from the past 240 hour(s))  Blood culture (routine x 2)     Status: Abnormal   Collection Time: 09/26/17  5:00 AM  Result Value Ref Range Status   Specimen Description BLOOD RIGHT ANTECUBITAL  Final   Special Requests   Final    BOTTLES DRAWN AEROBIC AND ANAEROBIC Blood Culture results may not be optimal due to an excessive volume of blood received in culture bottles   Culture  Setup Time   Final    GRAM NEGATIVE RODS IN BOTH AEROBIC AND ANAEROBIC BOTTLES CRITICAL RESULT CALLED TO, READ BACK BY AND VERIFIED WITHMardella Layman PHARMD 1941 09/26/17 A BROWNING    Culture (A)  Final    KLEBSIELLA PNEUMONIAE SUSCEPTIBILITIES PERFORMED ON PREVIOUS CULTURE WITHIN THE LAST 5 DAYS.    Report Status 09/28/2017 FINAL  Final  Blood culture (routine x 2)     Status: Abnormal   Collection Time: 09/26/17  5:10 AM  Result Value Ref Range Status   Specimen Description BLOOD RIGHT HAND  Final   Special Requests   Final    BOTTLES DRAWN AEROBIC AND ANAEROBIC Blood Culture results may not be optimal due to an excessive volume of blood received in culture bottles   Culture  Setup Time    Final    GRAM NEGATIVE RODS IN BOTH AEROBIC AND ANAEROBIC BOTTLES CRITICAL RESULT CALLED TO, READ BACK BY AND VERIFIED WITH: Mardella Layman PHARMD 1941 09/26/17 A BROWNING    Culture KLEBSIELLA PNEUMONIAE (A)  Final   Report Status 09/28/2017 FINAL  Final   Organism ID, Bacteria KLEBSIELLA PNEUMONIAE  Final      Susceptibility   Klebsiella pneumoniae - MIC*    AMPICILLIN RESISTANT Resistant     CEFAZOLIN <=4 SENSITIVE Sensitive     CEFEPIME <=1 SENSITIVE Sensitive     CEFTAZIDIME <=1 SENSITIVE Sensitive     CEFTRIAXONE <=1 SENSITIVE Sensitive     CIPROFLOXACIN <=0.25 SENSITIVE Sensitive     GENTAMICIN <=1 SENSITIVE Sensitive     IMIPENEM <=0.25 SENSITIVE Sensitive     TRIMETH/SULFA <=20 SENSITIVE Sensitive     AMPICILLIN/SULBACTAM <=2 SENSITIVE Sensitive     PIP/TAZO <=4 SENSITIVE Sensitive     Extended ESBL NEGATIVE Sensitive     * KLEBSIELLA PNEUMONIAE  Blood Culture ID Panel (Reflexed)     Status: Abnormal   Collection Time: 09/26/17  5:10 AM  Result Value Ref Range Status   Enterococcus species NOT DETECTED NOT DETECTED Final   Listeria monocytogenes NOT DETECTED NOT DETECTED Final   Staphylococcus species NOT DETECTED NOT DETECTED Final   Staphylococcus aureus NOT DETECTED NOT DETECTED Final   Streptococcus species NOT DETECTED NOT DETECTED Final   Streptococcus agalactiae NOT DETECTED NOT DETECTED Final   Streptococcus pneumoniae NOT DETECTED NOT DETECTED Final   Streptococcus pyogenes NOT DETECTED NOT DETECTED Final   Acinetobacter baumannii NOT DETECTED NOT DETECTED Final   Enterobacteriaceae species DETECTED (A) NOT DETECTED Final    Comment: Enterobacteriaceae represent a large family of gram-negative bacteria, not a single organism. CRITICAL RESULT CALLED TO, READ BACK BY AND VERIFIED WITHMardella Layman PHARMD [id number] 09/26/17 A BROWNING    Enterobacter cloacae complex  NOT DETECTED NOT DETECTED Final   Escherichia coli NOT DETECTED NOT DETECTED Final   Klebsiella oxytoca NOT  DETECTED NOT DETECTED Final   Klebsiella pneumoniae DETECTED (A) NOT DETECTED Final    Comment: CRITICAL RESULT CALLED TO, READ BACK BY AND VERIFIED WITH: Mardella Layman PHARMD 1941 09/26/17 A BROWNING    Proteus species NOT DETECTED NOT DETECTED Final   Serratia marcescens NOT DETECTED NOT DETECTED Final   Carbapenem resistance NOT DETECTED NOT DETECTED Final   Haemophilus influenzae NOT DETECTED NOT DETECTED Final   Neisseria meningitidis NOT DETECTED NOT DETECTED Final   Pseudomonas aeruginosa NOT DETECTED NOT DETECTED Final   Candida albicans NOT DETECTED NOT DETECTED Final   Candida glabrata NOT DETECTED NOT DETECTED Final   Candida krusei NOT DETECTED NOT DETECTED Final   Candida parapsilosis NOT DETECTED NOT DETECTED Final   Candida tropicalis NOT DETECTED NOT DETECTED Final  Culture, Urine     Status: None   Collection Time: 09/26/17  4:44 PM  Result Value Ref Range Status   Specimen Description URINE, CLEAN CATCH  Final   Special Requests NONE  Final   Culture NO GROWTH  Final   Report Status 09/27/2017 FINAL  Final  MRSA PCR Screening     Status: None   Collection Time: 09/26/17  8:42 PM  Result Value Ref Range Status   MRSA by PCR NEGATIVE NEGATIVE Final    Comment:        The GeneXpert MRSA Assay (FDA approved for NASAL specimens only), is one component of a comprehensive MRSA colonization surveillance program. It is not intended to diagnose MRSA infection nor to guide or monitor treatment for MRSA infections.   Culture, blood (Routine X 2) w Reflex to ID Panel     Status: None (Preliminary result)   Collection Time: 09/28/17  7:13 PM  Result Value Ref Range Status   Specimen Description BLOOD RIGHT ARM  Final   Special Requests   Final    BOTTLES DRAWN AEROBIC ONLY Blood Culture adequate volume   Culture NO GROWTH < 24 HOURS  Final   Report Status PENDING  Incomplete  Culture, blood (Routine X 2) w Reflex to ID Panel     Status: None (Preliminary result)    Collection Time: 09/28/17  7:16 PM  Result Value Ref Range Status   Specimen Description BLOOD RIGHT HAND  Final   Special Requests   Final    BOTTLES DRAWN AEROBIC ONLY Blood Culture adequate volume   Culture NO GROWTH < 24 HOURS  Final   Report Status PENDING  Incomplete    Radiology Reports Ct Head Wo Contrast  Result Date: 09/28/2017 CLINICAL DATA:  Altered mental status EXAM: CT HEAD WITHOUT CONTRAST TECHNIQUE: Contiguous axial images were obtained from the base of the skull through the vertex without intravenous contrast. COMPARISON:  Head CT 04/12/2011 FINDINGS: Brain: No mass lesion, intraparenchymal hemorrhage or extra-axial collection. There is a focal area of hypoattenuation within the anterior left pons on a single image. Brain parenchyma and CSF-containing spaces are otherwise normal for age. Vascular: No hyperdense vessel or unexpected calcification. Skull: Normal visualized skull base, calvarium and extracranial soft tissues. Sinuses/Orbits: No sinus fluid levels or advanced mucosal thickening. No mastoid effusion. Normal orbits. IMPRESSION: 1. Hypoattenuation in the left pons. This is an area that is prone to artifact, but the appearance is somewhat atypical and MRI should be considered to exclude an ischemic event at this location. 2. No intracranial hemorrhage or mass effect.  Electronically Signed   By: Ulyses Jarred M.D.   On: 09/28/2017 20:13   Mr Brain Wo Contrast  Result Date: 09/29/2017 CLINICAL DATA:  Altered level of consciousness. Low-density in the left pons by CT. EXAM: MRI HEAD WITHOUT CONTRAST TECHNIQUE: Multiplanar, multiecho pulse sequences of the brain and surrounding structures were obtained without intravenous contrast. COMPARISON:  Head CT from yesterday FINDINGS: Brain: No acute infarction, hemorrhage, hydrocephalus, extra-axial collection or mass lesion. The left pontine finding a was artifactual. Mild to moderate for age cerebral atrophy that is generalized.  No notable chronic ischemic injury. Vascular: Major flow voids are preserved Skull and upper cervical spine: Negative for marrow lesion. Remote right sided craniotomy, reportedly for subdural hematoma evacuation. Sinuses/Orbits: Bilateral cataract resection. Other: Intermittently motion degraded. IMPRESSION: 1. No acute finding.  The pontine finding by CT was artifactual. 2. Generalized cerebral atrophy. 3. Motion degraded. Electronically Signed   By: Monte Fantasia M.D.   On: 09/29/2017 18:21   Dg Chest Port 1 View  Result Date: 09/28/2017 CLINICAL DATA:  Shortness of Breath EXAM: PORTABLE CHEST 1 VIEW COMPARISON:  Chest radiograph September 26, 2017 and chest CT September 26, 2017 FINDINGS: There is airspace consolidation in the left lower lobe with left pleural effusion. Lungs elsewhere are clear. Heart is upper normal in size with pulmonary vascularity are normal. No adenopathy. There is aortic atherosclerosis. No adenopathy. There is postoperative change in the lower cervical spine region. IMPRESSION: Airspace consolidation consistent with pneumonia left lower lobe with left pleural effusion. Right lung clear. Stable cardiac silhouette. There is aortic atherosclerosis. Aortic Atherosclerosis (ICD10-I70.0). Electronically Signed   By: Lowella Grip III M.D.   On: 09/28/2017 09:46   Dg Chest Portable 1 View  Result Date: 09/26/2017 CLINICAL DATA:  Acute onset of substernal chest pain.  Nausea. EXAM: PORTABLE CHEST 1 VIEW COMPARISON:  Chest radiograph performed 06/02/2013 FINDINGS: The lungs are mildly hypoexpanded. Mild left basilar airspace opacity likely reflects atelectasis. No pleural effusion or pneumothorax is seen. The cardiomediastinal silhouette is borderline normal in size. No acute osseous abnormalities are identified. Cervical spinal fusion hardware is noted. IMPRESSION: Lungs mildly hypoexpanded. Mild left basilar airspace opacity likely reflects atelectasis. Electronically Signed   By:  Garald Balding M.D.   On: 09/26/2017 02:22   Ct Angio Chest/abd/pel For Dissection W And/or Wo Contrast  Result Date: 09/26/2017 CLINICAL DATA:  Acute onset of substernal chest pain. Nausea and left-sided abdominal pain. EXAM: CT ANGIOGRAPHY CHEST, ABDOMEN AND PELVIS TECHNIQUE: Multidetector CT imaging through the chest, abdomen and pelvis was performed using the standard protocol during bolus administration of intravenous contrast. Multiplanar reconstructed images and MIPs were obtained and reviewed to evaluate the vascular anatomy. CONTRAST:  147mL ISOVUE-370 IOPAMIDOL (ISOVUE-370) INJECTION 76% COMPARISON:  Chest radiograph performed earlier today at 2:34 a.m., and right upper quadrant ultrasound performed 04/29/2014 FINDINGS: CTA CHEST FINDINGS Cardiovascular: There is no evidence of aortic dissection. There is no evidence of aneurysmal dilatation. Mild calcification is seen along the aortic arch and proximal left subclavian artery. There is no evidence of significant pulmonary embolus. The heart remains normal in size. Scattered coronary artery calcifications are seen. Mediastinum/Nodes: The mediastinum is unremarkable in appearance. No mediastinal lymphadenopathy is seen. No pericardial effusion is identified. The thyroid gland is unremarkable. No axillary lymphadenopathy is appreciated. Lungs/Pleura: Minimal bibasilar atelectasis is noted. No pleural effusion or pneumothorax is seen. No masses are identified. Musculoskeletal: No acute osseous abnormalities are identified. Anterior cervical spinal fusion hardware is noted. The visualized musculature  is unremarkable in appearance. Review of the MIP images confirms the above findings. CTA ABDOMEN AND PELVIS FINDINGS VASCULAR Aorta: There is no evidence of aortic dissection. There is no evidence of aneurysmal dilatation. Mild calcification is seen along the abdominal aorta and its branches. Celiac: The celiac trunk remains patent. SMA: The superior  mesenteric artery remains patent, with mild calcification at the origin of the superior mesenteric artery. Renals: The renal arteries appear patent bilaterally, with mild calcification at the origins of both renal arteries. IMA: The inferior mesenteric artery is unremarkable in appearance. Inflow: Mild scattered calcification is seen along the common and internal iliac arteries bilaterally, and along the left common femoral artery. Veins: The visualized venous structures are grossly unremarkable. Review of the MIP images confirms the above findings. NON-VASCULAR Hepatobiliary: Mild soft tissue inflammation is noted about the common bile duct and base of the gallbladder. This raises question for mild cholecystitis. The liver is unremarkable in appearance. Pancreas: The pancreas is within normal limits. Spleen: The spleen is unremarkable in appearance. Adrenals/Urinary Tract: The adrenal glands are unremarkable in appearance. Nonspecific perinephric stranding is noted bilaterally. Mild bilateral renal scarring is noted. There is no evidence hydronephrosis. No renal or ureteral stones are identified. Stomach/Bowel: The stomach is unremarkable in appearance. The small bowel is within normal limits. The appendix is normal in caliber, without evidence of appendicitis. Prominent diverticula are noted at the cecum, and a few additional scattered diverticula are noted along the remainder of the colon. Mild wall thickening along the ascending colon may simply reflect intraluminal contents, though a mild infectious or inflammatory process might have a similar appearance. Lymphatic: No retroperitoneal or pelvic sidewall lymphadenopathy is seen. Reproductive: The bladder is mildly distended and grossly unremarkable. The patient is status post prostatectomy. Other: No additional soft tissue abnormalities are seen. Musculoskeletal: No acute osseous abnormalities are identified. The visualized musculature is unremarkable in  appearance. Review of the MIP images confirms the above findings. IMPRESSION: 1. No evidence of aortic dissection. No evidence of aneurysmal dilatation. 2. No evidence of significant pulmonary embolus. 3. Mild soft tissue inflammation about the common bile duct and base of the gallbladder. This raises question for mild cholecystitis, though it may remain within normal limits. Would correlate with the patient's symptoms. 4. Mild wall thickening along the ascending colon may simply reflect intraluminal contents, though a mild infectious or inflammatory process might have a similar appearance. 5. Aortic Atherosclerosis (ICD10-I70.0). 6. Scattered coronary artery calcifications. 7. Prominent diverticula at the cecum, and scattered diverticula along the remainder of the colon. Electronically Signed   By: Garald Balding M.D.   On: 09/26/2017 03:27   US Abdomen Limited Ruq  Result Date: 09/26/2017 CLINICAL DATA:  Acute onset of right upper quadrant abdominal pain. EXAM: ULTRASOUND ABDOMEN LIMITED RIGHT UPPER QUADRANT COMPARISON:  Right upper quadrant ultrasound performed 04/29/2014 FINDINGS: Gallbladder: No gallstones or wall thickening visualized. No sonographic Murphy sign noted by sonographer. Common bile duct: Diameter: 0.3 cm, within normal limits in caliber. Liver: No focal lesion identified. Within normal limits in parenchymal echogenicity. Portal vein is patent on color Doppler imaging with normal direction of blood flow towards the liver. IMPRESSION: Unremarkable ultrasound of the right upper quadrant. Electronically Signed   By: Garald Balding M.D.   On: 09/26/2017 04:44    Lab Data:  CBC: Recent Labs  Lab 09/26/17 0142 09/26/17 0151 09/27/17 0813 09/28/17 0155 09/29/17 0215 09/30/17 0236  WBC 14.5*  --  13.7* 10.4 8.9 13.1*  NEUTROABS 12.3*  --   --   --   --   --  HGB 14.4 14.3 12.5* 11.5* 11.7* 11.3*  HCT 41.6 42.0 36.7* 32.8* 33.9* 32.9*  MCV 101.7*  --  104.0* 103.5* 101.8* 101.9*    PLT 133*  --  65* 49* 66* 84*   Basic Metabolic Panel: Recent Labs  Lab 09/26/17 0142 09/26/17 0151 09/27/17 0813 09/28/17 0155 09/28/17 0951 09/29/17 0215 09/30/17 0236  NA 137 139 134* 134*  --  136 136  K 4.5 4.5 3.6 4.2  --  3.4* 4.1  CL 103 101 104 105  --  102 102  CO2 23  --  21* 22  --  24 26  GLUCOSE 136* 136* 130* 113*  --  219* 139*  BUN 19 23* 14 18  --  23* 20  CREATININE 1.13 1.30* 1.00 0.92  --  0.93 0.72  CALCIUM 8.0*  --  6.9* 6.8*  --  7.3* 7.2*  MG  --   --   --   --  1.5* 1.7  --    GFR: Estimated Creatinine Clearance: 76.1 mL/min (by C-G formula based on SCr of 0.72 mg/dL). Liver Function Tests: Recent Labs  Lab 09/26/17 0142 09/27/17 0813 09/28/17 0155 09/29/17 0215  AST 192* 155*  160* 128* 85*  ALT 80* 106*  108* 92* 79*  ALKPHOS 175* 135*  138* 122 129*  BILITOT 2.7* 7.8*  8.1* 7.2* 4.0*  PROT 5.8* 5.3*  5.0* 4.7* 5.4*  ALBUMIN 3.0* 2.5*  2.5* 2.3* 2.5*   Recent Labs  Lab 09/26/17 0142  LIPASE 41   No results for input(s): AMMONIA in the last 168 hours. Coagulation Profile: Recent Labs  Lab 09/26/17 0142  INR 1.26   Cardiac Enzymes: Recent Labs  Lab 09/26/17 0142 09/26/17 0507  TROPONINI <0.03 <0.03   BNP (last 3 results) No results for input(s): PROBNP in the last 8760 hours. HbA1C: No results for input(s): HGBA1C in the last 72 hours. CBG: No results for input(s): GLUCAP in the last 168 hours. Lipid Profile: No results for input(s): CHOL, HDL, LDLCALC, TRIG, CHOLHDL, LDLDIRECT in the last 72 hours. Thyroid Function Tests: No results for input(s): TSH, T4TOTAL, FREET4, T3FREE, THYROIDAB in the last 72 hours. Anemia Panel: Recent Labs    09/28/17 0155  VITAMINB12 236  FOLATE 10.7   Urine analysis:    Component Value Date/Time   COLORURINE AMBER (A) 09/26/2017 1632   APPEARANCEUR CLEAR 09/26/2017 1632   LABSPEC >1.046 (H) 09/26/2017 1632   PHURINE 5.0 09/26/2017 1632   GLUCOSEU NEGATIVE 09/26/2017 1632    HGBUR NEGATIVE 09/26/2017 1632   BILIRUBINUR SMALL (A) 09/26/2017 1632   KETONESUR 5 (A) 09/26/2017 1632   PROTEINUR NEGATIVE 09/26/2017 1632   UROBILINOGEN 1.0 01/01/2013 1626   NITRITE NEGATIVE 09/26/2017 1632   LEUKOCYTESUR NEGATIVE 09/26/2017 1632     Bodin Gorka M.D. [facility] 09/30/2017, 10:27 AM  Pager: [phone] Between 7am to 7pm - call Pager - [phone]  After 7pm go to [email or web address] - password [email or web address]  Call night coverage person covering after 7pm

## 2017-09-30 NOTE — Progress Notes (Signed)
ANTICOAGULATION CONSULT NOTE - Follow Up Consult  Pharmacy Consult for Bivalrudin Indication: atrial fibrillation (rule out HIT)  Allergies  Allergen Reactions  . Heparin     HIT lab pending 09/28/17    Patient Measurements: Height: 5\' 9"  (175.3 cm) Weight: 180 lb (81.6 kg)(per pt report ) IBW/kg (Calculated) : 70.7 Heparin Dosing Weight: 81.6 kg  Vital Signs: Temp: 97.6 F (36.4 C) (11/18 1148) Temp Source: Axillary (11/18 1148) BP: 147/69 (11/18 1148) Pulse Rate: 104 (11/18 1148)  Labs: Recent Labs    09/28/17 0155  09/28/17 1913 09/29/17 0215 09/30/17 0236  HGB 11.5*  --   --  11.7* 11.3*  HCT 32.8*  --   --  33.9* 32.9*  PLT 49*  --   --  66* 84*  APTT 64*   < > 76* 75* 51*  HEPARINUNFRC 0.98*  --   --   --   --   CREATININE 0.92  --   --  0.93 0.72   < > = values in this interval not displayed.    Estimated Creatinine Clearance: 76.1 mL/min (by C-G formula based on SCr of 0.72 mg/dL).  Medications: Bivalrudin @ 0.15 mg/kg/hr  Assessment:  76 yo male on Eliquis PTA for afib, with last dose 11/13 at 9 AM per patient report. Pharmacy was consulted to dose heparin while eliquis on hold for any possible surgical intervention. Platelets trended down with 4T score of ~3. Decision was made by MD to change heparin to bivalrudin (direct thrombin inhibitor) and check HIT panel.   Current aPTT is 51, therapeutic on bivalrudin 0.15 mg/kg/hr. No bleeding reported. Hgb stable 11s, pltc improved to 84.  HIT panel still in process this afternoon.  Goal of Therapy:  APTT 50-85 Monitor platelets by anticoagulation protocol: Yes   Plan:  Continue bivalrudin at 0.15 mg/kg/hr Daily aPTT Monitor daily CBC, for s/sx bleeding F/u HIT Ab results  Erin Hearing PharmD., BCPS Clinical Pharmacist Pager [phone] 09/30/2017 1:36 PM

## 2017-10-01 ENCOUNTER — Inpatient Hospital Stay (HOSPITAL_COMMUNITY): Payer: Medicare Other

## 2017-10-01 LAB — CBC
HCT: 35.2 % — ABNORMAL LOW (ref 39.0–52.0)
Hemoglobin: 12 g/dL — ABNORMAL LOW (ref 13.0–17.0)
MCH: 34.5 pg — ABNORMAL HIGH (ref 26.0–34.0)
MCHC: 34.1 g/dL (ref 30.0–36.0)
MCV: 101.1 fL — AB (ref 78.0–100.0)
PLATELETS: 96 10*3/uL — AB (ref 150–400)
RBC: 3.48 MIL/uL — AB (ref 4.22–5.81)
RDW: 12.8 % (ref 11.5–15.5)
WBC: 9.7 10*3/uL (ref 4.0–10.5)

## 2017-10-01 LAB — BASIC METABOLIC PANEL
ANION GAP: 8 (ref 5–15)
BUN: 21 mg/dL — ABNORMAL HIGH (ref 6–20)
CALCIUM: 7.4 mg/dL — AB (ref 8.9–10.3)
CO2: 26 mmol/L (ref 22–32)
CREATININE: 0.73 mg/dL (ref 0.61–1.24)
Chloride: 100 mmol/L — ABNORMAL LOW (ref 101–111)
Glucose, Bld: 173 mg/dL — ABNORMAL HIGH (ref 65–99)
Potassium: 3.8 mmol/L (ref 3.5–5.1)
SODIUM: 134 mmol/L — AB (ref 135–145)

## 2017-10-01 LAB — APTT
APTT: 35 s (ref 24–36)
APTT: 58 s — AB (ref 24–36)

## 2017-10-01 LAB — HEPARIN INDUCED PLATELET AB (HIT ANTIBODY): Heparin Induced Plt Ab: 0.18 OD (ref 0.000–0.400)

## 2017-10-01 NOTE — Progress Notes (Signed)
[facility]                                                                              Patient Demographics  Samuel Flores, is a 76 y.o. male, DOB - 10/24/1941, TIR:[id number]  Admit date - 09/26/2017   Admitting Physician Etta Quill, DO  Outpatient Primary MD for the patient is Ranshaw, Modena Nunnery, MD  Outpatient specialists:   LOS - 5  days   Medical records reviewed and are as summarized below:    Chief Complaint  Patient presents with  . Chest Pain       Brief summary   Samuel Flores 76 y.o.malewith history ofdaily EtOH use, CAD, gout, and A.Fib on eliquis who presented to the ED with acute onset central chest and epigastric pain. On EMS arrival ASA and NTG given and SBP dropped to 90s. In the ED Trop was negative, EKG was w/o acute changes, and CTa demonstrated no dissection nor PE but did show mild inflamation around the CBD and gallbladder. RUQ [place] was unremarkable. Patient was found to have epigastric and RUQ tenderness. Lipase neg. AST 192, ALT 80, Bili 2.7   Assessment & Plan    Principal Problem:   Sepsis ([place]), severe due to Klebsiella bacteremia, LLL pneumonia -Suspected from pneumonia, chest x-ray 11/16 showed airspace consolidation consistent with pneumonia left lower lobe with left pleural effusion -CT abdomen with possible cholecystitis, ultrasound showed no gallstones or wall thickening, no sonographic Murphy sign -Continue IV Rocephin, blood cultures 11/16 so far negative -Continue dysphagia 3 diet, SLP evaluation done yesterday   Active Problems: Acute metabolic encephalopathy - at baseline, confirmed by wife at the bedside, medication versus 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  MRI of the brain negative for acute stroke.  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  - follow HIT ab, sent on 11/16    Chronic atrial fibrillation with RVR -Status post failed DCCV (Dr [name]) on 03/02/17 -Patient went back into RVR yesterday after the Cardizem drip was discontinued.  Restarted Cardizem drip, on scheduled metoprolol as well. -Consulted cardiology, Dr. 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 -Continue IV Lasix 40 mg daily, strict I's and O's and daily weights (180lbs on 11/14-> 193 lbs on 11/19) -Cardiology consulted     Elevated LFTs -Likely due to sepsis, hypotension and hypoperfusion, improving   Code Status: Full CODE STATUS DVT Prophylaxis: Angiomax Family Communication: Discussed in detail with the patient, all imaging results, lab results explained to the patient and wife at the bedside  Disposition Plan: will follow in  stepdown today  Time Spent in minutes   25 minutes  Procedures:    Consultants:   Cardiology  Antimicrobials:   IV Rocephin 11/14>   Medications  Scheduled Meds: . allopurinol  200 mg Oral Daily  . aspirin EC  81 mg Oral Daily  . chlorhexidine  15 mL Mouth Rinse BID  . feeding supplement (ENSURE ENLIVE)  237 mL Oral BID BM  . folic acid  1 mg Oral Daily  . furosemide  40 mg Intravenous Daily  . ipratropium  0.5 mg Nebulization TID   And  . levalbuterol  0.63 mg Nebulization TID  . mouth rinse  15 mL Mouth Rinse q12n4p  . metoprolol tartrate  5 mg Intravenous Q6H  . multivitamin with minerals  1 tablet Oral Daily  .  pantoprazole  40 mg Oral Q1200  . pneumococcal 23 valent vaccine  0.5 mL Intramuscular Tomorrow-1000  . predniSONE  40 mg Oral Q breakfast  . sodium chloride flush  3 mL Intravenous Q12H  . thiamine  100 mg Oral Daily   Continuous Infusions: . sodium chloride    . bivalirudin (ANGIOMAX) infusion 0.5 mg/mL (Non-ACS indications) 0.18 mg/kg/hr (10/01/17 0926)  . cefTRIAXone (ROCEPHIN)  IV Stopped (09/30/17 2325)  . diltiazem (CARDIZEM) infusion 10 mg/hr (10/01/17 0700)   PRN Meds:.sodium chloride, acetaminophen **OR** acetaminophen, fentaNYL (SUBLIMAZE) injection, gi cocktail, levalbuterol, LORazepam, ondansetron **OR** ondansetron (ZOFRAN) IV, sodium chloride, sodium chloride flush   Antibiotics   Anti-infectives (From admission, onward)   Start     Dose/Rate Route Frequency Ordered Stop   09/26/17 2200  cefTRIAXone (ROCEPHIN) 2 g in dextrose 5 % 50 mL IVPB     2 g 100 mL/hr over 30 Minutes Intravenous Every 24 hours 09/26/17 2000     09/26/17 1200  piperacillin-tazobactam (ZOSYN) IVPB 3.375 g  Status:  Discontinued     3.375 g 12.5 mL/hr over 240 Minutes Intravenous Every 8 hours 09/26/17 0449 09/26/17 1958   09/26/17 0400  piperacillin-tazobactam (ZOSYN) IVPB 3.375 g     3.375 g 100 mL/hr over 30 Minutes Intravenous  Once 09/26/17 0353 09/26/17 0554        Subjective:   Samuel Flores was seen and examined today.  Overall improving, heart rate improving today on Cardizem drip, still in A. fib.  No significant wheezing today.   Patient denies dizziness, chest pain, abdominal pain, N/V/D/C, new weakness, numbess, tingling.    Objective:   Vitals:   10/01/17 0326 10/01/17 0400 10/01/17 0700 10/01/17 0907  BP:  (!) 154/75    Pulse:  97    Resp:  (!) 23    Temp:   97.9 F (36.6 C)   TempSrc:   Oral   SpO2:  96%  99%  Weight: 87.8 kg (193 lb 9 oz)     Height:        Intake/Output Summary (Last 24 hours) at 10/01/2017 0935 Last data filed at 10/01/2017 0741 Gross per  24 hour  Intake 1328.82 ml  Output 2500 ml  Net -1171.18 ml     Wt Readings from Last 3 Encounters:  10/01/17 87.8 kg (193 lb 9 oz)  04/25/17 82.1 kg (181 lb)  02/27/17 83.5 kg (184 lb)     Exam General: Alert and oriented x 3, NAD, frail  Eyes:  HEENT:  Atraumatic, normocephalic, normal oropharynx Cardiovascular: S1 S2 clear, ireg ireg. No pedal edema b/l Respiratory: Decreased breath sound at the bases Gastrointestinal: Soft, nontender, nondistended, + bowel sounds Ext:  no pedal edema bilaterally Neuro: no new deficit Musculoskeletal: No digital cyanosis, clubbing Skin: No rashes Psych: Normal affect and demeanor, alert and oriented x3    Data Reviewed:  I have personally reviewed following labs and imaging studies  Micro Results Recent Results (from the past 240 hour(s))  Blood culture (routine x 2)     Status: Abnormal   Collection Time: 09/26/17  5:00 AM  Result Value Ref Range Status   Specimen Description BLOOD RIGHT ANTECUBITAL  Final   Special Requests   Final    BOTTLES DRAWN AEROBIC AND ANAEROBIC Blood Culture results may not be optimal due to an excessive volume of blood received in culture bottles   Culture  Setup Time   Final    GRAM NEGATIVE RODS IN BOTH AEROBIC AND ANAEROBIC BOTTLES CRITICAL RESULT CALLED TO, READ BACK BY AND VERIFIED WITHMardella Layman PHARMD 1941 09/26/17 A BROWNING    Culture (A)  Final    KLEBSIELLA PNEUMONIAE SUSCEPTIBILITIES PERFORMED ON PREVIOUS CULTURE WITHIN THE LAST 5 DAYS.    Report Status 09/28/2017 FINAL  Final  Blood culture (routine x 2)     Status: Abnormal   Collection Time: 09/26/17  5:10 AM  Result Value Ref Range Status   Specimen Description BLOOD RIGHT HAND  Final   Special Requests   Final    BOTTLES DRAWN AEROBIC AND ANAEROBIC Blood Culture results may not be optimal due to an excessive volume of blood received in culture bottles   Culture  Setup Time   Final    GRAM NEGATIVE RODS IN BOTH AEROBIC AND  ANAEROBIC BOTTLES CRITICAL RESULT CALLED TO, READ BACK BY AND VERIFIED WITH: Mardella Layman PHARMD 1941 09/26/17 A BROWNING    Culture KLEBSIELLA PNEUMONIAE (A)  Final   Report Status 09/28/2017 FINAL  Final   Organism ID, Bacteria KLEBSIELLA PNEUMONIAE  Final      Susceptibility   Klebsiella pneumoniae - MIC*    AMPICILLIN RESISTANT Resistant     CEFAZOLIN <=4 SENSITIVE Sensitive     CEFEPIME <=1 SENSITIVE Sensitive     CEFTAZIDIME <=1 SENSITIVE Sensitive     CEFTRIAXONE <=1 SENSITIVE Sensitive     CIPROFLOXACIN <=0.25 SENSITIVE Sensitive     GENTAMICIN <=1 SENSITIVE Sensitive     IMIPENEM <=0.25 SENSITIVE Sensitive     TRIMETH/SULFA <=20 SENSITIVE Sensitive     AMPICILLIN/SULBACTAM <=2 SENSITIVE Sensitive     PIP/TAZO <=4 SENSITIVE Sensitive     Extended ESBL NEGATIVE Sensitive     * KLEBSIELLA PNEUMONIAE  Blood Culture ID Panel (Reflexed)     Status: Abnormal   Collection Time: 09/26/17  5:10 AM  Result Value Ref Range Status   Enterococcus species NOT DETECTED NOT DETECTED Final   Listeria monocytogenes NOT DETECTED NOT DETECTED Final   Staphylococcus species NOT DETECTED NOT DETECTED Final   Staphylococcus aureus NOT DETECTED NOT DETECTED Final   Streptococcus species NOT DETECTED NOT DETECTED Final   Streptococcus agalactiae NOT DETECTED NOT DETECTED Final   Streptococcus pneumoniae NOT DETECTED NOT DETECTED Final   Streptococcus pyogenes NOT DETECTED NOT DETECTED Final   Acinetobacter baumannii NOT DETECTED NOT DETECTED Final   Enterobacteriaceae species DETECTED (A) NOT DETECTED Final    Comment: Enterobacteriaceae represent a large family of gram-negative bacteria, not a single organism. CRITICAL RESULT CALLED TO, READ BACK BY AND VERIFIED WITH: Mardella Layman PHARMD [id number] 09/26/17 A BROWNING    Enterobacter cloacae complex NOT DETECTED NOT DETECTED Final   Escherichia coli NOT DETECTED NOT  DETECTED Final   Klebsiella oxytoca NOT DETECTED NOT DETECTED Final   Klebsiella  pneumoniae DETECTED (A) NOT DETECTED Final    Comment: CRITICAL RESULT CALLED TO, READ BACK BY AND VERIFIED WITH: Mardella Layman PHARMD 1941 09/26/17 A BROWNING    Proteus species NOT DETECTED NOT DETECTED Final   Serratia marcescens NOT DETECTED NOT DETECTED Final   Carbapenem resistance NOT DETECTED NOT DETECTED Final   Haemophilus influenzae NOT DETECTED NOT DETECTED Final   Neisseria meningitidis NOT DETECTED NOT DETECTED Final   Pseudomonas aeruginosa NOT DETECTED NOT DETECTED Final   Candida albicans NOT DETECTED NOT DETECTED Final   Candida glabrata NOT DETECTED NOT DETECTED Final   Candida krusei NOT DETECTED NOT DETECTED Final   Candida parapsilosis NOT DETECTED NOT DETECTED Final   Candida tropicalis NOT DETECTED NOT DETECTED Final  Culture, Urine     Status: None   Collection Time: 09/26/17  4:44 PM  Result Value Ref Range Status   Specimen Description URINE, CLEAN CATCH  Final   Special Requests NONE  Final   Culture NO GROWTH  Final   Report Status 09/27/2017 FINAL  Final  MRSA PCR Screening     Status: None   Collection Time: 09/26/17  8:42 PM  Result Value Ref Range Status   MRSA by PCR NEGATIVE NEGATIVE Final    Comment:        The GeneXpert MRSA Assay (FDA approved for NASAL specimens only), is one component of a comprehensive MRSA colonization surveillance program. It is not intended to diagnose MRSA infection nor to guide or monitor treatment for MRSA infections.   Culture, blood (Routine X 2) w Reflex to ID Panel     Status: None (Preliminary result)   Collection Time: 09/28/17  7:13 PM  Result Value Ref Range Status   Specimen Description BLOOD RIGHT ARM  Final   Special Requests   Final    BOTTLES DRAWN AEROBIC ONLY Blood Culture adequate volume   Culture NO GROWTH 2 DAYS  Final   Report Status PENDING  Incomplete  Culture, blood (Routine X 2) w Reflex to ID Panel     Status: None (Preliminary result)   Collection Time: 09/28/17  7:16 PM  Result Value  Ref Range Status   Specimen Description BLOOD RIGHT HAND  Final   Special Requests   Final    BOTTLES DRAWN AEROBIC ONLY Blood Culture adequate volume   Culture NO GROWTH 2 DAYS  Final   Report Status PENDING  Incomplete    Radiology Reports Ct Head Wo Contrast  Result Date: 09/28/2017 CLINICAL DATA:  Altered mental status EXAM: CT HEAD WITHOUT CONTRAST TECHNIQUE: Contiguous axial images were obtained from the base of the skull through the vertex without intravenous contrast. COMPARISON:  Head CT 04/12/2011 FINDINGS: Brain: No mass lesion, intraparenchymal hemorrhage or extra-axial collection. There is a focal area of hypoattenuation within the anterior left pons on a single image. Brain parenchyma and CSF-containing spaces are otherwise normal for age. Vascular: No hyperdense vessel or unexpected calcification. Skull: Normal visualized skull base, calvarium and extracranial soft tissues. Sinuses/Orbits: No sinus fluid levels or advanced mucosal thickening. No mastoid effusion. Normal orbits. IMPRESSION: 1. Hypoattenuation in the left pons. This is an area that is prone to artifact, but the appearance is somewhat atypical and MRI should be considered to exclude an ischemic event at this location. 2. No intracranial hemorrhage or mass effect. Electronically Signed   By: Ulyses Jarred M.D.   On: 09/28/2017 20:13  Mr Brain Wo Contrast  Result Date: 09/29/2017 CLINICAL DATA:  Altered level of consciousness. Low-density in the left pons by CT. EXAM: MRI HEAD WITHOUT CONTRAST TECHNIQUE: Multiplanar, multiecho pulse sequences of the brain and surrounding structures were obtained without intravenous contrast. COMPARISON:  Head CT from yesterday FINDINGS: Brain: No acute infarction, hemorrhage, hydrocephalus, extra-axial collection or mass lesion. The left pontine finding a was artifactual. Mild to moderate for age cerebral atrophy that is generalized. No notable chronic ischemic injury. Vascular: Major  flow voids are preserved Skull and upper cervical spine: Negative for marrow lesion. Remote right sided craniotomy, reportedly for subdural hematoma evacuation. Sinuses/Orbits: Bilateral cataract resection. Other: Intermittently motion degraded. IMPRESSION: 1. No acute finding.  The pontine finding by CT was artifactual. 2. Generalized cerebral atrophy. 3. Motion degraded. Electronically Signed   By: Monte Fantasia M.D.   On: 09/29/2017 18:21   Dg Chest Port 1 View  Result Date: 09/30/2017 CLINICAL DATA:  Shortness of Breath EXAM: PORTABLE CHEST 1 VIEW COMPARISON:  09/28/2017 FINDINGS: Cardiac shadow is mildly enlarged but stable. Lungs are hypoinflated without focal infiltrate or sizable effusion. Postsurgical changes in the cervical spine are noted. Previously seen changes in the left base have cleared in the interval. IMPRESSION: Significant improved aeration without focal infiltrate at this time. Electronically Signed   By: Inez Catalina M.D.   On: 09/30/2017 15:52   Dg Chest Port 1 View  Result Date: 09/28/2017 CLINICAL DATA:  Shortness of Breath EXAM: PORTABLE CHEST 1 VIEW COMPARISON:  Chest radiograph September 26, 2017 and chest CT September 26, 2017 FINDINGS: There is airspace consolidation in the left lower lobe with left pleural effusion. Lungs elsewhere are clear. Heart is upper normal in size with pulmonary vascularity are normal. No adenopathy. There is aortic atherosclerosis. No adenopathy. There is postoperative change in the lower cervical spine region. IMPRESSION: Airspace consolidation consistent with pneumonia left lower lobe with left pleural effusion. Right lung clear. Stable cardiac silhouette. There is aortic atherosclerosis. Aortic Atherosclerosis (ICD10-I70.0). Electronically Signed   By: Lowella Grip III M.D.   On: 09/28/2017 09:46   Dg Chest Portable 1 View  Result Date: 09/26/2017 CLINICAL DATA:  Acute onset of substernal chest pain.  Nausea. EXAM: PORTABLE CHEST 1 VIEW  COMPARISON:  Chest radiograph performed 06/02/2013 FINDINGS: The lungs are mildly hypoexpanded. Mild left basilar airspace opacity likely reflects atelectasis. No pleural effusion or pneumothorax is seen. The cardiomediastinal silhouette is borderline normal in size. No acute osseous abnormalities are identified. Cervical spinal fusion hardware is noted. IMPRESSION: Lungs mildly hypoexpanded. Mild left basilar airspace opacity likely reflects atelectasis. Electronically Signed   By: Garald Balding M.D.   On: 09/26/2017 02:22   Ct Angio Chest/abd/pel For Dissection W And/or Wo Contrast  Result Date: 09/26/2017 CLINICAL DATA:  Acute onset of substernal chest pain. Nausea and left-sided abdominal pain. EXAM: CT ANGIOGRAPHY CHEST, ABDOMEN AND PELVIS TECHNIQUE: Multidetector CT imaging through the chest, abdomen and pelvis was performed using the standard protocol during bolus administration of intravenous contrast. Multiplanar reconstructed images and MIPs were obtained and reviewed to evaluate the vascular anatomy. CONTRAST:  15mL ISOVUE-370 IOPAMIDOL (ISOVUE-370) INJECTION 76% COMPARISON:  Chest radiograph performed earlier today at 2:34 a.m., and right upper quadrant ultrasound performed 04/29/2014 FINDINGS: CTA CHEST FINDINGS Cardiovascular: There is no evidence of aortic dissection. There is no evidence of aneurysmal dilatation. Mild calcification is seen along the aortic arch and proximal left subclavian artery. There is no evidence of significant pulmonary embolus. The heart remains  normal in size. Scattered coronary artery calcifications are seen. Mediastinum/Nodes: The mediastinum is unremarkable in appearance. No mediastinal lymphadenopathy is seen. No pericardial effusion is identified. The thyroid gland is unremarkable. No axillary lymphadenopathy is appreciated. Lungs/Pleura: Minimal bibasilar atelectasis is noted. No pleural effusion or pneumothorax is seen. No masses are identified. Musculoskeletal:  No acute osseous abnormalities are identified. Anterior cervical spinal fusion hardware is noted. The visualized musculature is unremarkable in appearance. Review of the MIP images confirms the above findings. CTA ABDOMEN AND PELVIS FINDINGS VASCULAR Aorta: There is no evidence of aortic dissection. There is no evidence of aneurysmal dilatation. Mild calcification is seen along the abdominal aorta and its branches. Celiac: The celiac trunk remains patent. SMA: The superior mesenteric artery remains patent, with mild calcification at the origin of the superior mesenteric artery. Renals: The renal arteries appear patent bilaterally, with mild calcification at the origins of both renal arteries. IMA: The inferior mesenteric artery is unremarkable in appearance. Inflow: Mild scattered calcification is seen along the common and internal iliac arteries bilaterally, and along the left common femoral artery. Veins: The visualized venous structures are grossly unremarkable. Review of the MIP images confirms the above findings. NON-VASCULAR Hepatobiliary: Mild soft tissue inflammation is noted about the common bile duct and base of the gallbladder. This raises question for mild cholecystitis. The liver is unremarkable in appearance. Pancreas: The pancreas is within normal limits. Spleen: The spleen is unremarkable in appearance. Adrenals/Urinary Tract: The adrenal glands are unremarkable in appearance. Nonspecific perinephric stranding is noted bilaterally. Mild bilateral renal scarring is noted. There is no evidence hydronephrosis. No renal or ureteral stones are identified. Stomach/Bowel: The stomach is unremarkable in appearance. The small bowel is within normal limits. The appendix is normal in caliber, without evidence of appendicitis. Prominent diverticula are noted at the cecum, and a few additional scattered diverticula are noted along the remainder of the colon. Mild wall thickening along the ascending colon may  simply reflect intraluminal contents, though a mild infectious or inflammatory process might have a similar appearance. Lymphatic: No retroperitoneal or pelvic sidewall lymphadenopathy is seen. Reproductive: The bladder is mildly distended and grossly unremarkable. The patient is status post prostatectomy. Other: No additional soft tissue abnormalities are seen. Musculoskeletal: No acute osseous abnormalities are identified. The visualized musculature is unremarkable in appearance. Review of the MIP images confirms the above findings. IMPRESSION: 1. No evidence of aortic dissection. No evidence of aneurysmal dilatation. 2. No evidence of significant pulmonary embolus. 3. Mild soft tissue inflammation about the common bile duct and base of the gallbladder. This raises question for mild cholecystitis, though it may remain within normal limits. Would correlate with the patient's symptoms. 4. Mild wall thickening along the ascending colon may simply reflect intraluminal contents, though a mild infectious or inflammatory process might have a similar appearance. 5. Aortic Atherosclerosis (ICD10-I70.0). 6. Scattered coronary artery calcifications. 7. Prominent diverticula at the cecum, and scattered diverticula along the remainder of the colon. Electronically Signed   By: Garald Balding M.D.   On: 09/26/2017 03:27   US Abdomen Limited Ruq  Result Date: 09/26/2017 CLINICAL DATA:  Acute onset of right upper quadrant abdominal pain. EXAM: ULTRASOUND ABDOMEN LIMITED RIGHT UPPER QUADRANT COMPARISON:  Right upper quadrant ultrasound performed 04/29/2014 FINDINGS: Gallbladder: No gallstones or wall thickening visualized. No sonographic Murphy sign noted by sonographer. Common bile duct: Diameter: 0.3 cm, within normal limits in caliber. Liver: No focal lesion identified. Within normal limits in parenchymal echogenicity. Portal vein is patent on  color Doppler imaging with normal direction of blood flow towards the liver.  IMPRESSION: Unremarkable ultrasound of the right upper quadrant. Electronically Signed   By: Garald Balding M.D.   On: 09/26/2017 04:44    Lab Data:  CBC: Recent Labs  Lab 09/26/17 0142  09/27/17 0813 09/28/17 0155 09/29/17 0215 09/30/17 0236 10/01/17 0149  WBC 14.5*  --  13.7* 10.4 8.9 13.1* 9.7  NEUTROABS 12.3*  --   --   --   --   --   --   HGB 14.4   < > 12.5* 11.5* 11.7* 11.3* 12.0*  HCT 41.6   < > 36.7* 32.8* 33.9* 32.9* 35.2*  MCV 101.7*  --  104.0* 103.5* 101.8* 101.9* 101.1*  PLT 133*  --  65* 49* 66* 84* 96*   < > = values in this interval not displayed.   Basic Metabolic Panel: Recent Labs  Lab 09/27/17 0813 09/28/17 0155 09/28/17 0951 09/29/17 0215 09/30/17 0236 10/01/17 0149  NA 134* 134*  --  136 136 134*  K 3.6 4.2  --  3.4* 4.1 3.8  CL 104 105  --  102 102 100*  CO2 21* 22  --  24 26 26   GLUCOSE 130* 113*  --  219* 139* 173*  BUN 14 18  --  23* 20 21*  CREATININE 1.00 0.92  --  0.93 0.72 0.73  CALCIUM 6.9* 6.8*  --  7.3* 7.2* 7.4*  MG  --   --  1.5* 1.7  --   --    GFR: Estimated Creatinine Clearance: 83.4 mL/min (by C-G formula based on SCr of 0.73 mg/dL). Liver Function Tests: Recent Labs  Lab 09/26/17 0142 09/27/17 0813 09/28/17 0155 09/29/17 0215  AST 192* 155*  160* 128* 85*  ALT 80* 106*  108* 92* 79*  ALKPHOS 175* 135*  138* 122 129*  BILITOT 2.7* 7.8*  8.1* 7.2* 4.0*  PROT 5.8* 5.3*  5.0* 4.7* 5.4*  ALBUMIN 3.0* 2.5*  2.5* 2.3* 2.5*   Recent Labs  Lab 09/26/17 0142  LIPASE 41   No results for input(s): AMMONIA in the last 168 hours. Coagulation Profile: Recent Labs  Lab 09/26/17 0142  INR 1.26   Cardiac Enzymes: Recent Labs  Lab 09/26/17 0142 09/26/17 0507  TROPONINI <0.03 <0.03   BNP (last 3 results) No results for input(s): PROBNP in the last 8760 hours. HbA1C: No results for input(s): HGBA1C in the last 72 hours. CBG: No results for input(s): GLUCAP in the last 168 hours. Lipid Profile: No results for  input(s): CHOL, HDL, LDLCALC, TRIG, CHOLHDL, LDLDIRECT in the last 72 hours. Thyroid Function Tests: No results for input(s): TSH, T4TOTAL, FREET4, T3FREE, THYROIDAB in the last 72 hours. Anemia Panel: No results for input(s): VITAMINB12, FOLATE, FERRITIN, TIBC, IRON, RETICCTPCT in the last 72 hours. Urine analysis:    Component Value Date/Time   COLORURINE AMBER (A) 09/26/2017 1632   APPEARANCEUR CLEAR 09/26/2017 1632   LABSPEC >1.046 (H) 09/26/2017 1632   PHURINE 5.0 09/26/2017 1632   GLUCOSEU NEGATIVE 09/26/2017 1632   HGBUR NEGATIVE 09/26/2017 1632   BILIRUBINUR SMALL (A) 09/26/2017 1632   KETONESUR 5 (A) 09/26/2017 1632   PROTEINUR NEGATIVE 09/26/2017 1632   UROBILINOGEN 1.0 01/01/2013 1626   NITRITE NEGATIVE 09/26/2017 1632   LEUKOCYTESUR NEGATIVE 09/26/2017 1632     Rhodia Acres M.D. [facility] 10/01/2017, 9:35 AM  Pager: [phone] Between 7am to 7pm - call Pager - [phone]  After 7pm go to [email or web address] -  password [email or web address]  Call night coverage person covering after 7pm

## 2017-10-01 NOTE — Progress Notes (Signed)
  Echocardiogram 2D Echocardiogram has been performed.  Samuel Flores 10/01/2017, 10:30 AM

## 2017-10-01 NOTE — Progress Notes (Signed)
SLP Cancellation Note  Patient Details Name: Samuel Flores MRN: [id number] DOB: [birth year]   Cancelled treatment:       Reason Eval/Treat Not Completed: Patient at procedure or test/unavailable - pt currently having in-room procedure. Will continue efforts to follow up after BSE, for assessment of diet tolerance and to provide education.  Celia B. Quentin Ore [facility], CCC-SLP Speech Language Pathologist [phone]  Shonna Chock 10/01/2017, 9:57 AM

## 2017-10-01 NOTE — Plan of Care (Signed)
  Progressing SLP Dysphagia Goals Patient will utilize recommended strategies Description Patient will utilize recommended strategies during swallow to increase swallowing safety with min assist.  10/01/2017 1230 - Progressing by Colon Flattery B, CCC-SLP Flowsheets Taken 10/01/2017 1230  Patient will utilize recommended strategies during swallow to increase swallowing safety with  min assist

## 2017-10-01 NOTE — Progress Notes (Signed)
PT Cancellation Note  Patient Details Name: Samuel Flores MRN: [id number] DOB: Jan 15, 1941   Cancelled Treatment:    Reason Eval/Treat Not Completed: Other (comment). Pt currently receiving breathing treatment. Will follow-up for PT evaluation as schedule permits.  Mabeline Caras, PT, DPT Acute Rehab Services  Pager: [phone] 10/01/2017, 9:15 AM

## 2017-10-01 NOTE — Progress Notes (Signed)
  Speech Language Pathology Treatment: Dysphagia  Patient Details Name: Samuel Flores MRN: [id number] DOB: 11/28/1940 Today's Date: 10/01/2017 Time: 4825-0037 SLP Time Calculation (min) (ACUTE ONLY): 16 min  Assessment / Plan / Recommendation Clinical Impression  Pt seen at bedside to follow up after BSE completed 09/30/17. RN reports poor intake and itnermittent SOB, but tolerance of current diet. Pt indicates he does not have much of an appetite. No overt s/s aspiration observed on any consistency tested at this time. Safe swallow precautions were reviewed and posted at [facility]. ST will continue to follow to assess diet tolerance and provide education.    HPI HPI: Pt is a 76 y.o. male with PMH of EtOH use daily, CAD, gout, A.Fib rate controlled on eliquis. Patient presented to the ED 11/14with c/o central chest and epigastric pain, symptoms onset while resting at about 7:30pm. Pale on EMS arrival. ASA and NTG given and SBP dropped to 90s. Ongoing chest, epigastric pain, as well as nausea. Has Klebsiella pneumoniae bacteremia- unknown source. CXR showed Airspace consolidation consistent with pneumonia left lower lobe with left pleural effusion. Right lung clear. Had been on clear liquid diet; advanced to dysphagia 2 today. RN reported pt coughing at times during PO intake; seems to have more trouble with straws. Bedside swallow eval ordered.      SLP Plan  Continue with current plan of care       Recommendations  Diet recommendations: Dysphagia 3 (mechanical soft);Dysphagia 1 (puree) Medication Administration: Whole meds with liquid Supervision: Patient able to self feed Compensations: Slow rate;Small sips/bites;Minimize environmental distractions Postural Changes and/or Swallow Maneuvers: Seated upright 90 degrees                Oral Care Recommendations: Oral care BID Follow up Recommendations: Other (comment)(TBD) SLP Visit Diagnosis: Dysphagia, unspecified (R13.10) Plan:  Continue with current plan of care       [place]. Quentin Ore [facility], CCC-SLP Speech Language Pathologist [phone]  Shonna Chock 10/01/2017, 12:26 PM

## 2017-10-01 NOTE — Progress Notes (Addendum)
ANTICOAGULATION CONSULT NOTE - Follow Up Consult  Pharmacy Consult for Bivalirudin Indication: atrial fibrillation (rule out HIT)  Allergies  Allergen Reactions  . Heparin     HIT lab pending 09/28/17    Patient Measurements: Height: 5\' 9"  (175.3 cm) Weight: 193 lb 9 oz (87.8 kg) IBW/kg (Calculated) : 70.7 Bivalirudin Dosing Weight: 81.6 kg  Vital Signs: Temp: 97.9 F (36.6 C) (11/19 0700) Temp Source: Oral (11/19 0700) BP: 154/75 (11/19 0400) Pulse Rate: 97 (11/19 0400)  Labs: Recent Labs    09/29/17 0215 09/30/17 0236 10/01/17 0149  HGB 11.7* 11.3* 12.0*  HCT 33.9* 32.9* 35.2*  PLT 66* 84* 96*  APTT 75* 51* 35  CREATININE 0.93 0.72 0.73    Estimated Creatinine Clearance: 83.4 mL/min (by C-G formula based on SCr of 0.73 mg/dL).  Assessment:  76 yo male on Eliquis PTA for afib, with last dose 11/13 at 9 AM per patient report. Pharmacy was consulted to dose heparin whileEliquison holdfor any possible surgical intervention. Platelets trended down with 4T score of ~3. Decision was made by MD to change heparin to bivalrudin (direct thrombin inhibitor) on 11/16 and check HIT panel.    aPTT is now subtherapeutic (35 seconds) on Bivalirudin at 0.15 mg/kg/hr. Prior values were in target range, though trended down the last 2 days.  Platelet count up to 96K today, was 133 on admit 09/26/17.    HIT Ab in process from 11/17.  Goal of Therapy:  aPTT 50-85 seconds Monitor platelets by anticoagulation protocol: Yes   Plan:   Increase Bivalirudin to 0.18 mg/kg/hr, 20% increase.  Next aPTT ~2 hrs after increase.  Daily aPTT and CBC.  Follow up HIT Ab results.  Arty Baumgartner, [place] Pager: [phone], or [phone] 10/01/2017,8:42 AM  Addendum:  aPTT is now 58 seconds on 0.18 mg/kg/hr Bivalirudin.  At goal.  Will continue same rate.  Next aPTT and CBC in am.  Consuello Masse, RPh 10/01/2017 12:48 PM

## 2017-10-02 LAB — CBC
HCT: 33 % — ABNORMAL LOW (ref 39.0–52.0)
Hemoglobin: 11.6 g/dL — ABNORMAL LOW (ref 13.0–17.0)
MCH: 35.3 pg — AB (ref 26.0–34.0)
MCHC: 35.2 g/dL (ref 30.0–36.0)
MCV: 100.3 fL — ABNORMAL HIGH (ref 78.0–100.0)
PLATELETS: 131 10*3/uL — AB (ref 150–400)
RBC: 3.29 MIL/uL — ABNORMAL LOW (ref 4.22–5.81)
RDW: 12.5 % (ref 11.5–15.5)
WBC: 13.2 10*3/uL — ABNORMAL HIGH (ref 4.0–10.5)

## 2017-10-02 LAB — BASIC METABOLIC PANEL
Anion gap: 9 (ref 5–15)
BUN: 22 mg/dL — AB (ref 6–20)
CALCIUM: 7.7 mg/dL — AB (ref 8.9–10.3)
CO2: 30 mmol/L (ref 22–32)
Chloride: 96 mmol/L — ABNORMAL LOW (ref 101–111)
Creatinine, Ser: 0.83 mg/dL (ref 0.61–1.24)
GFR calc Af Amer: >60 mL/min (ref >60)
GLUCOSE: 154 mg/dL — AB (ref 65–99)
Potassium: 3.1 mmol/L — ABNORMAL LOW (ref 3.5–5.1)
Sodium: 135 mmol/L (ref 135–145)

## 2017-10-02 LAB — GLUCOSE, CAPILLARY: Glucose-Capillary: 102 mg/dL — ABNORMAL HIGH (ref 65–99)

## 2017-10-02 LAB — ECHOCARDIOGRAM COMPLETE
HEIGHTINCHES: 69 in
Weight: 3097.02 oz

## 2017-10-02 LAB — APTT: APTT: 58 s — AB (ref 24–36)

## 2017-10-02 MED ORDER — METOPROLOL TARTRATE 25 MG PO TABS
25.0000 mg | ORAL_TABLET | Freq: Four times a day (QID) | ORAL | Status: DC
  Administered 2017-10-02 – 2017-10-04 (×8): 25 mg via ORAL
  Filled 2017-10-02 (×8): qty 1

## 2017-10-02 MED ORDER — SENNOSIDES-DOCUSATE SODIUM 8.6-50 MG PO TABS
1.0000 | ORAL_TABLET | Freq: Two times a day (BID) | ORAL | Status: DC
  Administered 2017-10-02 – 2017-10-06 (×5): 1 via ORAL
  Filled 2017-10-02 (×8): qty 1

## 2017-10-02 MED ORDER — APIXABAN 5 MG PO TABS
5.0000 mg | ORAL_TABLET | Freq: Two times a day (BID) | ORAL | Status: DC
  Administered 2017-10-02 – 2017-10-06 (×9): 5 mg via ORAL
  Filled 2017-10-02 (×9): qty 1

## 2017-10-02 MED ORDER — POTASSIUM CHLORIDE CRYS ER 20 MEQ PO TBCR
40.0000 meq | EXTENDED_RELEASE_TABLET | Freq: Every day | ORAL | Status: DC
  Administered 2017-10-02 – 2017-10-03 (×2): 40 meq via ORAL
  Filled 2017-10-02 (×2): qty 2

## 2017-10-02 MED ORDER — DILTIAZEM HCL 30 MG PO TABS
30.0000 mg | ORAL_TABLET | Freq: Four times a day (QID) | ORAL | Status: DC
  Administered 2017-10-02 – 2017-10-05 (×12): 30 mg via ORAL
  Filled 2017-10-02 (×12): qty 1

## 2017-10-02 MED ORDER — POLYETHYLENE GLYCOL 3350 17 G PO PACK: 17.0000 g | PACK | Freq: Every day | ORAL | Status: DC | PRN

## 2017-10-02 MED ORDER — DIGOXIN 250 MCG PO TABS
0.2500 mg | ORAL_TABLET | Freq: Every day | ORAL | Status: DC
  Administered 2017-10-02 – 2017-10-03 (×2): 0.25 mg via ORAL
  Filled 2017-10-02 (×2): qty 1

## 2017-10-02 NOTE — Evaluation (Addendum)
Physical Therapy Evaluation Patient Details Name: Samuel Flores MRN: [id number] DOB: 12-Jul-1941 Today's Date: 10/02/2017   History of Present Illness  Pt is a 76 y.o. male who presented to ED on 09/26/17 acute onset central chest and epigastric pain. pt worked up for severe sepsis due to Klebsiella bactermia, suspected from pneumonia in LLL. CT head showed hypoattenuation in the left pons possible artifact. MRI of the brain negative for acute stroke. Pt also with chronic a-fib with RVR. Other pertinent PMH includes AKI, EtOH use, CAD, gout.    Clinical Impression  Pt presents with decreased activity tolerance and an overall decrease in functional mobility secondary to above. PTA, pt indep for ambulation with intermittent use of RW; lives with wife who is available for 24/7 supervision. Today, pt able to sit and stand with RW, only requiring intermittent min guard for balance; further mobility limited by c/o unsubsiding dizziness with transitional movements (see BP values below). SpO2 remained >90% on RA throughout. Feel that once this improves, pt will be able to progress well and be appropriate for HHPT services at d/c to maximize functional mobility and independence. Will continue to follow acutely to address established goals.  Sitting BP 129/70 Standing 130/109 Standing ~2 min 99/67 Return to supine 128/86    Follow Up Recommendations Home health PT;Supervision/Assistance - 24 hour    Equipment Recommendations  None recommended by PT    Recommendations for Other Services OT consult     Precautions / Restrictions Precautions Precautions: Fall Restrictions Weight Bearing Restrictions: No      Mobility  Bed Mobility Overal bed mobility: Needs Assistance Bed Mobility: Supine to Sit;Sit to Supine     Supine to sit: Supervision;HOB elevated Sit to supine: Supervision   General bed mobility comments: No physical assist required; able to scoot towards [place] before returning to  supine. C/o dizziness with sitting  Transfers Overall transfer level: Needs assistance Equipment used: Rolling walker (2 wheeled) Transfers: Sit to/from Stand Sit to Stand: Min guard         General transfer comment: Cues for hand placement on RW. Able to stand on 2nd attempt with min guard and minA to steady RW. C/o increased dizziness in standing  Ambulation/Gait             General Gait Details: Deferred secondary to drop in BP  Stairs            Wheelchair Mobility    Modified Rankin (Stroke Patients Only)       Balance Overall balance assessment: Needs assistance Sitting-balance support: No upper extremity supported;Feet unsupported;Feet supported Sitting balance-Leahy Scale: Good Sitting balance - Comments: Sat EOB >5 min for BP reading and UE/LE AROM waiting for dizziness to subside     Standing balance-Leahy Scale: Fair Standing balance comment: Able to static stand with no UE support. Stood >4 min for multiple BP readings secondary to increased dizziness                             Pertinent Vitals/Pain Pain Assessment: No/denies pain    Home Living Family/patient expects to be discharged to:: Private residence Living Arrangements: Spouse/significant other Available Help at Discharge: Family;Available 24 hours/day Type of Home: House Home Access: Level entry     Home Layout: Two level;Bed/bath upstairs Home Equipment: Walker - 2 wheels      Prior Function Level of Independence: Independent         Comments: Intermittent  use of RW for increased stability. Pt drives. Reports DOE amb ~150' to end of driveway     Hand Dominance        Extremity/Trunk Assessment   Upper Extremity Assessment Upper Extremity Assessment: Generalized weakness    Lower Extremity Assessment Lower Extremity Assessment: Generalized weakness       Communication   Communication: No difficulties  Cognition Arousal/Alertness:  Awake/alert Behavior During Therapy: WFL for tasks assessed/performed Overall Cognitive Status: Within Functional Limits for tasks assessed                                        General Comments      Exercises     Assessment/Plan    PT Assessment Patient needs continued PT services  PT Problem List Decreased strength;Decreased activity tolerance;Decreased balance;Decreased mobility;Cardiopulmonary status limiting activity       PT Treatment Interventions DME instruction;Gait training;Stair training;Functional mobility training;Therapeutic activities;Therapeutic exercise;Balance training;Patient/family education    PT Goals (Current goals can be found in the Care Plan section)  Acute Rehab PT Goals Patient Stated Goal: Return home PT Goal Formulation: With patient Time For Goal Achievement: 10/09/17 Potential to Achieve Goals: Good    Frequency Min 3X/week   Barriers to discharge        Co-evaluation               AM-PAC PT "6 Clicks" Daily Activity  Outcome Measure Difficulty turning over in bed (including adjusting bedclothes, sheets and blankets)?: None Difficulty moving from lying on back to sitting on the side of the bed? : None Difficulty sitting down on and standing up from a chair with arms (e.g., wheelchair, bedside commode, etc,.)?: A Little Help needed moving to and from a bed to chair (including a wheelchair)?: A Little Help needed walking in hospital room?: A Lot Help needed climbing 3-5 steps with a railing? : A Lot 6 Click Score: 18    End of Session Equipment Utilized During Treatment: Gait belt Activity Tolerance: Treatment limited secondary to medical complications (Comment);Patient tolerated treatment well Patient left: in bed;with call bell/phone within reach;with family/visitor present Nurse Communication: Mobility status;Other (comment)(drop in BP) PT Visit Diagnosis: Other abnormalities of gait and mobility (R26.89)     Time: 3474-2595 PT Time Calculation (min) (ACUTE ONLY): 36 min   Charges:   PT Evaluation $PT Eval Moderate Complexity: 1 Mod PT Treatments $Therapeutic Activity: 8-22 mins   PT G Codes:       Mabeline Caras, PT, DPT Acute Rehab Services  Pager: [place] 10/02/2017, 12:00 PM

## 2017-10-02 NOTE — Progress Notes (Signed)
ANTICOAGULATION CONSULT NOTE - Follow Up Consult  Pharmacy Consult for Bivalirudin > Eliquis  Indication: atrial fibrillation  Allergies  Allergen Reactions  . Heparin     HIT lab pending 09/28/17    Patient Measurements: Height: 5\' 9"  (175.3 cm) Weight: 188 lb 0.8 oz (85.3 kg) IBW/kg (Calculated) : 70.7 Bivalirudin Dosing Weight: 81.6 kg  Vital Signs: Temp: 97.9 F (36.6 C) (11/20 0717) Temp Source: Oral (11/20 0717) BP: 140/87 (11/20 0717) Pulse Rate: 87 (11/20 0717)  Labs: Recent Labs    09/30/17 0236 10/01/17 0149 10/01/17 1042 10/02/17 0236  HGB 11.3* 12.0*  --  11.6*  HCT 32.9* 35.2*  --  33.0*  PLT 84* 96*  --  131*  APTT 51* 35 58* 58*  CREATININE 0.72 0.73  --  0.83    Estimated Creatinine Clearance: 79.4 mL/min (by C-G formula based on SCr of 0.83 mg/dL).  Assessment:  76 yo male on Eliquis PTA for afib, with last dose 11/13 at 9 AM per patient report. Pharmacy was consulted to dose heparin whileEliquison holdfor any possible surgical intervention. Platelets trended down with 4T score of ~3. Decision was made by MD to change heparin to bivalrudin (direct thrombin inhibitor). HIT panel from 11/16 & 11/17 were negative.   APTT was therapeutic on IV bivalirudin this AM. Now resuming home apixaban. H/H low stable. Plt improved to 131   Goal of Therapy:  Stroke prevention Monitor platelets by anticoagulation protocol: Yes   Plan:  Stop IV bivalirudin.  Resume apixaban 5 mg twice daily  Monitor CBC and s/s of bleeding   Albertina Parr, PharmD., BCPS Clinical Pharmacist Pager [phone]

## 2017-10-02 NOTE — Progress Notes (Signed)
[facility]                                                                              Patient Demographics  Samuel Flores, is a 76 y.o. male, DOB - 01/31/1941, ZOX:[id number]  Admit date - 09/26/2017   Admitting Physician Etta Quill, DO  Outpatient Primary MD for the patient is [place], [place] Nunnery, MD  Outpatient specialists:   LOS - 6  days   Medical records reviewed and are as summarized below:    Chief Complaint  Patient presents with  . Chest Pain       Brief summary   Samuel Flores 76 y.o.malewith history ofdaily EtOH use, CAD, gout, and A.Fib on eliquis who presented to the ED with acute onset central chest and epigastric pain. On EMS arrival ASA and NTG given and SBP dropped to 90s. In the ED Trop was negative, EKG was w/o acute changes, and CTa demonstrated no dissection nor PE but did show mild inflamation around the CBD and gallbladder. RUQ [place] was unremarkable. Patient was found to have epigastric and RUQ tenderness. Lipase neg. AST 192, ALT 80, Bili 2.7   Assessment & Plan    Principal Problem:   Sepsis ([place]), severe due to Klebsiella bacteremia, LLL pneumonia -Suspected from pneumonia, chest x-ray 11/16 showed airspace consolidation consistent with pneumonia left lower lobe with left pleural effusion -CT abdomen with possible cholecystitis, ultrasound showed no gallstones or wall thickening, no sonographic Murphy sign -Continue IV Rocephin, blood cultures 11/16 so far negative -Continue dysphagia 3 diet after SLP evaluation   Active Problems: Acute metabolic encephalopathy - at baseline, confirmed by wife at the bedside, medication versus 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  MRI of the brain negative for acute stroke.  Suspected HIT  -Patient was placed on Angiomax and heparin drip was discontinued.   -Hit antibody is however negative, platelets improving -Doppler  ultrasound negative for DVT or SVT in the left upper extremity. -Discontinued Angiomax, restart Eliquis per outpatient dose   Chronic atrial fibrillation with RVR -Status post failed DCCV (Dr [name]) on 03/02/17 -Patient went back into RVR on 11/18 after the Cardizem drip was discontinued.  Restarted Cardizem drip, on scheduled metoprolol as well. -Patient's cardiologist, Dr. 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 -Improving, continue IV Lasix 40 mg daily, strict I's and O's and daily weights (180lbs on 11/14-> 193 lbs on 11/19-> 188 on 11/20 -Cardiology consulted -May be able to transition to oral Lasix in a.m.  Hypokalemia -Replaced    Elevated LFTs -Likely due to sepsis, hypotension and hypoperfusion, improving  Deconditioned/generalized debility -PT OT evaluation.  I have discussed in detail with patient's wife yesterday 11/19, requested skilled  nursing facility as she has a hard time taking care of him.   Code Status: Full CODE STATUS DVT Prophylaxis: Angiomax Family Communication: Discussed in detail with the patient, all imaging results, lab results explained to the patient.  Discussed with wife on 11/19  Disposition Plan: will follow in stepdown today, will need skilled nursing facility  Time Spent in minutes   25 minutes  Procedures:    Consultants:   Cardiology  Antimicrobials:   IV Rocephin 11/14>   Medications  Scheduled Meds: . allopurinol  200 mg Oral Daily  . aspirin EC  81 mg Oral Daily  . chlorhexidine  15 mL Mouth Rinse BID  .  feeding supplement (ENSURE ENLIVE)  237 mL Oral BID BM  . folic acid  1 mg Oral Daily  . furosemide  40 mg Intravenous Daily  . ipratropium  0.5 mg Nebulization TID   And  . levalbuterol  0.63 mg Nebulization TID  . mouth rinse  15 mL Mouth Rinse q12n4p  . metoprolol tartrate  5 mg Intravenous Q6H  . multivitamin with minerals  1 tablet Oral Daily  . pantoprazole  40 mg Oral Q1200  . pneumococcal 23 valent vaccine  0.5 mL Intramuscular Tomorrow-1000  . potassium chloride  40 mEq Oral Daily  . predniSONE  40 mg Oral Q breakfast  . senna-docusate  1 tablet Oral BID  . sodium chloride flush  3 mL Intravenous Q12H  . thiamine  100 mg Oral Daily   Continuous Infusions: . sodium chloride    . bivalirudin (ANGIOMAX) infusion 0.5 mg/mL (Non-ACS indications) 0.18 mg/kg/hr (10/02/17 0142)  . cefTRIAXone (ROCEPHIN)  IV Stopped (10/01/17 2310)  . diltiazem (CARDIZEM) infusion 10 mg/hr (10/02/17 0700)   PRN Meds:.sodium chloride, acetaminophen **OR** acetaminophen, fentaNYL (SUBLIMAZE) injection, gi cocktail, levalbuterol, LORazepam, ondansetron **OR** ondansetron (ZOFRAN) IV, polyethylene glycol, sodium chloride, sodium chloride flush   Antibiotics   Anti-infectives (From admission, onward)   Start     Dose/Rate Route Frequency Ordered Stop   09/26/17 2200  cefTRIAXone (ROCEPHIN) 2 g in dextrose 5 % 50 mL IVPB     2 g 100 mL/hr over 30 Minutes Intravenous Every 24 hours 09/26/17 2000     09/26/17 1200  piperacillin-tazobactam (ZOSYN) IVPB 3.375 g  Status:  Discontinued     3.375 g 12.5 mL/hr over 240 Minutes Intravenous Every 8 hours 09/26/17 0449 09/26/17 1958   09/26/17 0400  piperacillin-tazobactam (ZOSYN) IVPB 3.375 g     3.375 g 100 mL/hr over 30 Minutes Intravenous  Once 09/26/17 0353 09/26/17 0554        Subjective:   Samuel Flores was seen and examined today.  Very deconditioned, no fevers.  Heart rate improving on Cardizem drip.  Wheezing improving. Patient denies  dizziness, chest pain, abdominal pain, N/V/D/C, new weakness, numbess, tingling.    Objective:   Vitals:   10/01/17 1944 10/01/17 2354 10/02/17 0453 10/02/17 0717  BP:  (!) 153/87 (!) 162/69 140/87  Pulse:  78 (!) 148 87  Resp:  19 (!) 21 (!) 22  Temp:  (!) 97.4 F (36.3 C) 98.4 F (36.9 C) 97.9 F (36.6 C)  TempSrc:  Oral Oral Oral  SpO2: 98% 94% 99% 96%  Weight:   85.3 kg (188 lb 0.8 oz)   Height:        Intake/Output Summary (Last 24 hours) at 10/02/2017 1051 Last data filed at 10/02/2017 0926 Gross per 24 hour  Intake 1148 ml  Output 1550 ml  Net -  402 ml     Wt Readings from Last 3 Encounters:  10/02/17 85.3 kg (188 lb 0.8 oz)  04/25/17 82.1 kg (181 lb)  02/27/17 83.5 kg (184 lb)     Exam   General: Alert and oriented x 3, NAD, ill-appearing  Eyes:  HEENT:    Cardiovascular: S1 S2 irregularly irregular No pedal edema b/l  Respiratory: Decreased breath sound at the bases  Gastrointestinal: Soft, nontender, nondistended, + bowel sounds  Ext: no pedal edema bilaterally  Neuro: no new deficits  Musculoskeletal: No digital cyanosis, clubbing  Skin: No rashes  Psych: Normal affect and demeanor, alert and oriented x3    Data Reviewed:  I have personally reviewed following labs and imaging studies  Micro Results Recent Results (from the past 240 hour(s))  Blood culture (routine x 2)     Status: Abnormal   Collection Time: 09/26/17  5:00 AM  Result Value Ref Range Status   Specimen Description BLOOD RIGHT ANTECUBITAL  Final   Special Requests   Final    BOTTLES DRAWN AEROBIC AND ANAEROBIC Blood Culture results may not be optimal due to an excessive volume of blood received in culture bottles   Culture  Setup Time   Final    GRAM NEGATIVE RODS IN BOTH AEROBIC AND ANAEROBIC BOTTLES CRITICAL RESULT CALLED TO, READ BACK BY AND VERIFIED WITHMardella Layman PHARMD 1941 09/26/17 A BROWNING    Culture (A)  Final    KLEBSIELLA PNEUMONIAE SUSCEPTIBILITIES  PERFORMED ON PREVIOUS CULTURE WITHIN THE LAST 5 DAYS.    Report Status 09/28/2017 FINAL  Final  Blood culture (routine x 2)     Status: Abnormal   Collection Time: 09/26/17  5:10 AM  Result Value Ref Range Status   Specimen Description BLOOD RIGHT HAND  Final   Special Requests   Final    BOTTLES DRAWN AEROBIC AND ANAEROBIC Blood Culture results may not be optimal due to an excessive volume of blood received in culture bottles   Culture  Setup Time   Final    GRAM NEGATIVE RODS IN BOTH AEROBIC AND ANAEROBIC BOTTLES CRITICAL RESULT CALLED TO, READ BACK BY AND VERIFIED WITH: Mardella Layman PHARMD 1941 09/26/17 A BROWNING    Culture KLEBSIELLA PNEUMONIAE (A)  Final   Report Status 09/28/2017 FINAL  Final   Organism ID, Bacteria KLEBSIELLA PNEUMONIAE  Final      Susceptibility   Klebsiella pneumoniae - MIC*    AMPICILLIN RESISTANT Resistant     CEFAZOLIN <=4 SENSITIVE Sensitive     CEFEPIME <=1 SENSITIVE Sensitive     CEFTAZIDIME <=1 SENSITIVE Sensitive     CEFTRIAXONE <=1 SENSITIVE Sensitive     CIPROFLOXACIN <=0.25 SENSITIVE Sensitive     GENTAMICIN <=1 SENSITIVE Sensitive     IMIPENEM <=0.25 SENSITIVE Sensitive     TRIMETH/SULFA <=20 SENSITIVE Sensitive     AMPICILLIN/SULBACTAM <=2 SENSITIVE Sensitive     PIP/TAZO <=4 SENSITIVE Sensitive     Extended ESBL NEGATIVE Sensitive     * KLEBSIELLA PNEUMONIAE  Blood Culture ID Panel (Reflexed)     Status: Abnormal   Collection Time: 09/26/17  5:10 AM  Result Value Ref Range Status   Enterococcus species NOT DETECTED NOT DETECTED Final   Listeria monocytogenes NOT DETECTED NOT DETECTED Final   Staphylococcus species NOT DETECTED NOT DETECTED Final   Staphylococcus aureus NOT DETECTED NOT DETECTED Final   Streptococcus species NOT DETECTED NOT DETECTED Final   Streptococcus agalactiae NOT DETECTED NOT DETECTED Final  Streptococcus pneumoniae NOT DETECTED NOT DETECTED Final   Streptococcus pyogenes NOT DETECTED NOT DETECTED Final    Acinetobacter baumannii NOT DETECTED NOT DETECTED Final   Enterobacteriaceae species DETECTED (A) NOT DETECTED Final    Comment: Enterobacteriaceae represent a large family of gram-negative bacteria, not a single organism. CRITICAL RESULT CALLED TO, READ BACK BY AND VERIFIED WITH: Mardella Layman PHARMD [id number] 09/26/17 A BROWNING    Enterobacter cloacae complex NOT DETECTED NOT DETECTED Final   Escherichia coli NOT DETECTED NOT DETECTED Final   Klebsiella oxytoca NOT DETECTED NOT DETECTED Final   Klebsiella pneumoniae DETECTED (A) NOT DETECTED Final    Comment: CRITICAL RESULT CALLED TO, READ BACK BY AND VERIFIED WITH: Mardella Layman PHARMD 1941 09/26/17 A BROWNING    Proteus species NOT DETECTED NOT DETECTED Final   Serratia marcescens NOT DETECTED NOT DETECTED Final   Carbapenem resistance NOT DETECTED NOT DETECTED Final   Haemophilus influenzae NOT DETECTED NOT DETECTED Final   Neisseria meningitidis NOT DETECTED NOT DETECTED Final   Pseudomonas aeruginosa NOT DETECTED NOT DETECTED Final   Candida albicans NOT DETECTED NOT DETECTED Final   Candida glabrata NOT DETECTED NOT DETECTED Final   Candida krusei NOT DETECTED NOT DETECTED Final   Candida parapsilosis NOT DETECTED NOT DETECTED Final   Candida tropicalis NOT DETECTED NOT DETECTED Final  Culture, Urine     Status: None   Collection Time: 09/26/17  4:44 PM  Result Value Ref Range Status   Specimen Description URINE, CLEAN CATCH  Final   Special Requests NONE  Final   Culture NO GROWTH  Final   Report Status 09/27/2017 FINAL  Final  MRSA PCR Screening     Status: None   Collection Time: 09/26/17  8:42 PM  Result Value Ref Range Status   MRSA by PCR NEGATIVE NEGATIVE Final    Comment:        The GeneXpert MRSA Assay (FDA approved for NASAL specimens only), is one component of a comprehensive MRSA colonization surveillance program. It is not intended to diagnose MRSA infection nor to guide or monitor treatment for MRSA infections.     Culture, blood (Routine X 2) w Reflex to ID Panel     Status: None (Preliminary result)   Collection Time: 09/28/17  7:13 PM  Result Value Ref Range Status   Specimen Description BLOOD RIGHT ARM  Final   Special Requests   Final    BOTTLES DRAWN AEROBIC ONLY Blood Culture adequate volume   Culture NO GROWTH 3 DAYS  Final   Report Status PENDING  Incomplete  Culture, blood (Routine X 2) w Reflex to ID Panel     Status: None (Preliminary result)   Collection Time: 09/28/17  7:16 PM  Result Value Ref Range Status   Specimen Description BLOOD RIGHT HAND  Final   Special Requests   Final    BOTTLES DRAWN AEROBIC ONLY Blood Culture adequate volume   Culture NO GROWTH 3 DAYS  Final   Report Status PENDING  Incomplete    Radiology Reports Ct Head Wo Contrast  Result Date: 09/28/2017 CLINICAL DATA:  Altered mental status EXAM: CT HEAD WITHOUT CONTRAST TECHNIQUE: Contiguous axial images were obtained from the base of the skull through the vertex without intravenous contrast. COMPARISON:  Head CT 04/12/2011 FINDINGS: Brain: No mass lesion, intraparenchymal hemorrhage or extra-axial collection. There is a focal area of hypoattenuation within the anterior left pons on a single image. Brain parenchyma and CSF-containing spaces are otherwise normal for age. Vascular:  No hyperdense vessel or unexpected calcification. Skull: Normal visualized skull base, calvarium and extracranial soft tissues. Sinuses/Orbits: No sinus fluid levels or advanced mucosal thickening. No mastoid effusion. Normal orbits. IMPRESSION: 1. Hypoattenuation in the left pons. This is an area that is prone to artifact, but the appearance is somewhat atypical and MRI should be considered to exclude an ischemic event at this location. 2. No intracranial hemorrhage or mass effect. Electronically Signed   By: Ulyses Jarred M.D.   On: 09/28/2017 20:13   Mr Brain Wo Contrast  Result Date: 09/29/2017 CLINICAL DATA:  Altered level of  consciousness. Low-density in the left pons by CT. EXAM: MRI HEAD WITHOUT CONTRAST TECHNIQUE: Multiplanar, multiecho pulse sequences of the brain and surrounding structures were obtained without intravenous contrast. COMPARISON:  Head CT from yesterday FINDINGS: Brain: No acute infarction, hemorrhage, hydrocephalus, extra-axial collection or mass lesion. The left pontine finding a was artifactual. Mild to moderate for age cerebral atrophy that is generalized. No notable chronic ischemic injury. Vascular: Major flow voids are preserved Skull and upper cervical spine: Negative for marrow lesion. Remote right sided craniotomy, reportedly for subdural hematoma evacuation. Sinuses/Orbits: Bilateral cataract resection. Other: Intermittently motion degraded. IMPRESSION: 1. No acute finding.  The pontine finding by CT was artifactual. 2. Generalized cerebral atrophy. 3. Motion degraded. Electronically Signed   By: Monte Fantasia M.D.   On: 09/29/2017 18:21   Dg Chest Port 1 View  Result Date: 09/30/2017 CLINICAL DATA:  Shortness of Breath EXAM: PORTABLE CHEST 1 VIEW COMPARISON:  09/28/2017 FINDINGS: Cardiac shadow is mildly enlarged but stable. Lungs are hypoinflated without focal infiltrate or sizable effusion. Postsurgical changes in the cervical spine are noted. Previously seen changes in the left base have cleared in the interval. IMPRESSION: Significant improved aeration without focal infiltrate at this time. Electronically Signed   By: Inez Catalina M.D.   On: 09/30/2017 15:52   Dg Chest Port 1 View  Result Date: 09/28/2017 CLINICAL DATA:  Shortness of Breath EXAM: PORTABLE CHEST 1 VIEW COMPARISON:  Chest radiograph September 26, 2017 and chest CT September 26, 2017 FINDINGS: There is airspace consolidation in the left lower lobe with left pleural effusion. Lungs elsewhere are clear. Heart is upper normal in size with pulmonary vascularity are normal. No adenopathy. There is aortic atherosclerosis. No  adenopathy. There is postoperative change in the lower cervical spine region. IMPRESSION: Airspace consolidation consistent with pneumonia left lower lobe with left pleural effusion. Right lung clear. Stable cardiac silhouette. There is aortic atherosclerosis. Aortic Atherosclerosis (ICD10-I70.0). Electronically Signed   By: Lowella Grip III M.D.   On: 09/28/2017 09:46   Dg Chest Portable 1 View  Result Date: 09/26/2017 CLINICAL DATA:  Acute onset of substernal chest pain.  Nausea. EXAM: PORTABLE CHEST 1 VIEW COMPARISON:  Chest radiograph performed 06/02/2013 FINDINGS: The lungs are mildly hypoexpanded. Mild left basilar airspace opacity likely reflects atelectasis. No pleural effusion or pneumothorax is seen. The cardiomediastinal silhouette is borderline normal in size. No acute osseous abnormalities are identified. Cervical spinal fusion hardware is noted. IMPRESSION: Lungs mildly hypoexpanded. Mild left basilar airspace opacity likely reflects atelectasis. Electronically Signed   By: Garald Balding M.D.   On: 09/26/2017 02:22   Ct Angio Chest/abd/pel For Dissection W And/or Wo Contrast  Result Date: 09/26/2017 CLINICAL DATA:  Acute onset of substernal chest pain. Nausea and left-sided abdominal pain. EXAM: CT ANGIOGRAPHY CHEST, ABDOMEN AND PELVIS TECHNIQUE: Multidetector CT imaging through the chest, abdomen and pelvis was performed using the standard protocol  during bolus administration of intravenous contrast. Multiplanar reconstructed images and MIPs were obtained and reviewed to evaluate the vascular anatomy. CONTRAST:  147mL ISOVUE-370 IOPAMIDOL (ISOVUE-370) INJECTION 76% COMPARISON:  Chest radiograph performed earlier today at 2:34 a.m., and right upper quadrant ultrasound performed 04/29/2014 FINDINGS: CTA CHEST FINDINGS Cardiovascular: There is no evidence of aortic dissection. There is no evidence of aneurysmal dilatation. Mild calcification is seen along the aortic arch and proximal left  subclavian artery. There is no evidence of significant pulmonary embolus. The heart remains normal in size. Scattered coronary artery calcifications are seen. Mediastinum/Nodes: The mediastinum is unremarkable in appearance. No mediastinal lymphadenopathy is seen. No pericardial effusion is identified. The thyroid gland is unremarkable. No axillary lymphadenopathy is appreciated. Lungs/Pleura: Minimal bibasilar atelectasis is noted. No pleural effusion or pneumothorax is seen. No masses are identified. Musculoskeletal: No acute osseous abnormalities are identified. Anterior cervical spinal fusion hardware is noted. The visualized musculature is unremarkable in appearance. Review of the MIP images confirms the above findings. CTA ABDOMEN AND PELVIS FINDINGS VASCULAR Aorta: There is no evidence of aortic dissection. There is no evidence of aneurysmal dilatation. Mild calcification is seen along the abdominal aorta and its branches. Celiac: The celiac trunk remains patent. SMA: The superior mesenteric artery remains patent, with mild calcification at the origin of the superior mesenteric artery. Renals: The renal arteries appear patent bilaterally, with mild calcification at the origins of both renal arteries. IMA: The inferior mesenteric artery is unremarkable in appearance. Inflow: Mild scattered calcification is seen along the common and internal iliac arteries bilaterally, and along the left common femoral artery. Veins: The visualized venous structures are grossly unremarkable. Review of the MIP images confirms the above findings. NON-VASCULAR Hepatobiliary: Mild soft tissue inflammation is noted about the common bile duct and base of the gallbladder. This raises question for mild cholecystitis. The liver is unremarkable in appearance. Pancreas: The pancreas is within normal limits. Spleen: The spleen is unremarkable in appearance. Adrenals/Urinary Tract: The adrenal glands are unremarkable in appearance.  Nonspecific perinephric stranding is noted bilaterally. Mild bilateral renal scarring is noted. There is no evidence hydronephrosis. No renal or ureteral stones are identified. Stomach/Bowel: The stomach is unremarkable in appearance. The small bowel is within normal limits. The appendix is normal in caliber, without evidence of appendicitis. Prominent diverticula are noted at the cecum, and a few additional scattered diverticula are noted along the remainder of the colon. Mild wall thickening along the ascending colon may simply reflect intraluminal contents, though a mild infectious or inflammatory process might have a similar appearance. Lymphatic: No retroperitoneal or pelvic sidewall lymphadenopathy is seen. Reproductive: The bladder is mildly distended and grossly unremarkable. The patient is status post prostatectomy. Other: No additional soft tissue abnormalities are seen. Musculoskeletal: No acute osseous abnormalities are identified. The visualized musculature is unremarkable in appearance. Review of the MIP images confirms the above findings. IMPRESSION: 1. No evidence of aortic dissection. No evidence of aneurysmal dilatation. 2. No evidence of significant pulmonary embolus. 3. Mild soft tissue inflammation about the common bile duct and base of the gallbladder. This raises question for mild cholecystitis, though it may remain within normal limits. Would correlate with the patient's symptoms. 4. Mild wall thickening along the ascending colon may simply reflect intraluminal contents, though a mild infectious or inflammatory process might have a similar appearance. 5. Aortic Atherosclerosis (ICD10-I70.0). 6. Scattered coronary artery calcifications. 7. Prominent diverticula at the cecum, and scattered diverticula along the remainder of the colon. Electronically Signed  By: Garald Balding M.D.   On: 09/26/2017 03:27   US Abdomen Limited Ruq  Result Date: 09/26/2017 CLINICAL DATA:  Acute onset of right  upper quadrant abdominal pain. EXAM: ULTRASOUND ABDOMEN LIMITED RIGHT UPPER QUADRANT COMPARISON:  Right upper quadrant ultrasound performed 04/29/2014 FINDINGS: Gallbladder: No gallstones or wall thickening visualized. No sonographic Murphy sign noted by sonographer. Common bile duct: Diameter: 0.3 cm, within normal limits in caliber. Liver: No focal lesion identified. Within normal limits in parenchymal echogenicity. Portal vein is patent on color Doppler imaging with normal direction of blood flow towards the liver. IMPRESSION: Unremarkable ultrasound of the right upper quadrant. Electronically Signed   By: Garald Balding M.D.   On: 09/26/2017 04:44    Lab Data:  CBC: Recent Labs  Lab 09/26/17 0142  09/28/17 0155 09/29/17 0215 09/30/17 0236 10/01/17 0149 10/02/17 0236  WBC 14.5*   < > 10.4 8.9 13.1* 9.7 13.2*  NEUTROABS 12.3*  --   --   --   --   --   --   HGB 14.4   < > 11.5* 11.7* 11.3* 12.0* 11.6*  HCT 41.6   < > 32.8* 33.9* 32.9* 35.2* 33.0*  MCV 101.7*   < > 103.5* 101.8* 101.9* 101.1* 100.3*  PLT 133*   < > 49* 66* 84* 96* 131*   < > = values in this interval not displayed.   Basic Metabolic Panel: Recent Labs  Lab 09/28/17 0155 09/28/17 0951 09/29/17 0215 09/30/17 0236 10/01/17 0149 10/02/17 0236  NA 134*  --  136 136 134* 135  K 4.2  --  3.4* 4.1 3.8 3.1*  CL 105  --  102 102 100* 96*  CO2 22  --  24 26 26 30   GLUCOSE 113*  --  219* 139* 173* 154*  BUN 18  --  23* 20 21* 22*  CREATININE 0.92  --  0.93 0.72 0.73 0.83  CALCIUM 6.8*  --  7.3* 7.2* 7.4* 7.7*  MG  --  1.5* 1.7  --   --   --    GFR: Estimated Creatinine Clearance: 79.4 mL/min (by C-G formula based on SCr of 0.83 mg/dL). Liver Function Tests: Recent Labs  Lab 09/26/17 0142 09/27/17 0813 09/28/17 0155 09/29/17 0215  AST 192* 155*  160* 128* 85*  ALT 80* 106*  108* 92* 79*  ALKPHOS 175* 135*  138* 122 129*  BILITOT 2.7* 7.8*  8.1* 7.2* 4.0*  PROT 5.8* 5.3*  5.0* 4.7* 5.4*  ALBUMIN 3.0*  2.5*  2.5* 2.3* 2.5*   Recent Labs  Lab 09/26/17 0142  LIPASE 41   No results for input(s): AMMONIA in the last 168 hours. Coagulation Profile: Recent Labs  Lab 09/26/17 0142  INR 1.26   Cardiac Enzymes: Recent Labs  Lab 09/26/17 0142 09/26/17 0507  TROPONINI <0.03 <0.03   BNP (last 3 results) No results for input(s): PROBNP in the last 8760 hours. HbA1C: No results for input(s): HGBA1C in the last 72 hours. CBG: Recent Labs  Lab 10/02/17 0748  GLUCAP 102*   Lipid Profile: No results for input(s): CHOL, HDL, LDLCALC, TRIG, CHOLHDL, LDLDIRECT in the last 72 hours. Thyroid Function Tests: No results for input(s): TSH, T4TOTAL, FREET4, T3FREE, THYROIDAB in the last 72 hours. Anemia Panel: No results for input(s): VITAMINB12, FOLATE, FERRITIN, TIBC, IRON, RETICCTPCT in the last 72 hours. Urine analysis:    Component Value Date/Time   COLORURINE AMBER (A) 09/26/2017 1632   APPEARANCEUR CLEAR 09/26/2017 1632  LABSPEC >1.046 (H) 09/26/2017 1632   PHURINE 5.0 09/26/2017 1632   GLUCOSEU NEGATIVE 09/26/2017 1632   HGBUR NEGATIVE 09/26/2017 1632   BILIRUBINUR SMALL (A) 09/26/2017 1632   KETONESUR 5 (A) 09/26/2017 1632   PROTEINUR NEGATIVE 09/26/2017 1632   UROBILINOGEN 1.0 01/01/2013 1626   NITRITE NEGATIVE 09/26/2017 1632   LEUKOCYTESUR NEGATIVE 09/26/2017 1632     Ripudeep Rai M.D. [facility] 10/02/2017, 10:51 AM  Pager: [phone] Between 7am to 7pm - call Pager - [phone]  After 7pm go to [email or web address] - password [email or web address]  Call night coverage person covering after 7pm

## 2017-10-02 NOTE — Consult Note (Signed)
Reason for Consult:  A. Fib with RVR Referring Physician: Loraine Leriche, MD  Samuel Flores is an 76 y.o. male.  HPI: Patient with h/o chronic atrial fibrillation and on chronic anticoagulatin since Aug 2018, H/O excessive alcohol intake, hypertension, cirrhosis of the liver by history, prior tobacco use, admitted with abdominal discomfort and sepsis. He states that He is not having any further, discomfort, denies any diarrhea, bloody stools, nausea vomiting.  Previously was also mildly nauseous.  Symptoms started acutely few hours her to the presentation to the hospital ED.  He denies any chest pain or palpitations.  His daughter is present the bedside.  States that he feels is constipated but denies any abdominal discomfort.  No black stools or dark stools.  Past Medical History:  Diagnosis Date  . Alcohol abuse, daily use 08/18/2013  . Arthritis    osteoarthritis  . CAD (coronary artery disease)    Non obstructive  . Cancer [facility]) 1994   prostate  . Diastolic dysfunction Echo 2012   Grade 1  . Gout   . 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 Hx-TIA (transient ischemic attack) 2011  . Hypercholesteremia   . Hypertension     Past Surgical History:  Procedure Laterality Date  . BURR HOLE FOR SUBDURAL HEMATOMA  03/2011  . CARDIOVERSION N/A 02/20/2017   Performed by Adrian Prows, MD at [facility] ENDOSCOPY  . EYE SURGERY Left 03/2009   cataract  . LEFT HEART CATHETERIZATION WITH CORONARY ANGIOGRAM N/A 01/02/2013   Performed by Laverda Page, MD at [facility] CATH LAB  . PROSTATE SURGERY  1994  . SPINE SURGERY     cervical    Family History  Problem Relation Age of Onset  . Lung cancer Mother        never smoker  . Prostate cancer Father     Social History:  reports that he quit smoking about 4 years ago. His smoking use included pipe. He quit after 39.00 years of use. he has never used smokeless tobacco. He reports that he drinks about 33.6 oz of alcohol per week. He reports that he does not use  drugs.  Allergies:  Allergies  Allergen Reactions  . Heparin     HIT lab pending 09/28/17    Medications: I have reviewed the patient's current medications.  Results for orders placed or performed during the hospital encounter of 09/26/17 (from the past 48 hour(s))  CBC     Status: Abnormal   Collection Time: 10/01/17  1:49 AM  Result Value Ref Range   WBC 9.7 4.0 - 10.5 K/uL   RBC 3.48 (L) 4.22 - 5.81 MIL/uL   Hemoglobin 12.0 (L) 13.0 - 17.0 g/dL   HCT 35.2 (L) 39.0 - 52.0 %   MCV 101.1 (H) 78.0 - 100.0 fL   MCH 34.5 (H) 26.0 - 34.0 pg   MCHC 34.1 30.0 - 36.0 g/dL   RDW 12.8 11.5 - 15.5 %   Platelets 96 (L) 150 - 400 K/uL    Comment: CONSISTENT WITH PREVIOUS RESULT  APTT     Status: None   Collection Time: 10/01/17  1:49 AM  Result Value Ref Range   aPTT 35 24 - 36 seconds  Basic metabolic panel     Status: Abnormal   Collection Time: 10/01/17  1:49 AM  Result Value Ref Range   Sodium 134 (L) 135 - 145 mmol/L   Potassium 3.8 3.5 - 5.1 mmol/L   Chloride 100 (L) 101 - 111  mmol/L   CO2 26 22 - 32 mmol/L   Glucose, Bld 173 (H) 65 - 99 mg/dL   BUN 21 (H) 6 - 20 mg/dL   Creatinine, Ser 0.73 0.61 - 1.24 mg/dL   Calcium 7.4 (L) 8.9 - 10.3 mg/dL   GFR calc non Af Amer >60 >60 mL/min   GFR calc Af Amer >60 >60 mL/min    Comment: (NOTE) The eGFR has been calculated using the CKD EPI equation. This calculation has not been validated in all clinical situations. eGFR's persistently <60 mL/min signify possible Chronic Kidney Disease.    Anion gap 8 5 - 15  APTT     Status: Abnormal   Collection Time: 10/01/17 10:42 AM  Result Value Ref Range   aPTT 58 (H) 24 - 36 seconds    Comment:        IF BASELINE aPTT IS ELEVATED, SUGGEST PATIENT RISK ASSESSMENT BE USED TO DETERMINE APPROPRIATE ANTICOAGULANT THERAPY.   CBC     Status: Abnormal   Collection Time: 10/02/17  2:36 AM  Result Value Ref Range   WBC 13.2 (H) 4.0 - 10.5 K/uL   RBC 3.29 (L) 4.22 - 5.81 MIL/uL    Hemoglobin 11.6 (L) 13.0 - 17.0 g/dL   HCT 33.0 (L) 39.0 - 52.0 %   MCV 100.3 (H) 78.0 - 100.0 fL   MCH 35.3 (H) 26.0 - 34.0 pg   MCHC 35.2 30.0 - 36.0 g/dL   RDW 12.5 11.5 - 15.5 %   Platelets 131 (L) 150 - 400 K/uL  APTT     Status: Abnormal   Collection Time: 10/02/17  2:36 AM  Result Value Ref Range   aPTT 58 (H) 24 - 36 seconds    Comment:        IF BASELINE aPTT IS ELEVATED, SUGGEST PATIENT RISK ASSESSMENT BE USED TO DETERMINE APPROPRIATE ANTICOAGULANT THERAPY.   Basic metabolic panel     Status: Abnormal   Collection Time: 10/02/17  2:36 AM  Result Value Ref Range   Sodium 135 135 - 145 mmol/L   Potassium 3.1 (L) 3.5 - 5.1 mmol/L    Comment: DELTA CHECK NOTED   Chloride 96 (L) 101 - 111 mmol/L   CO2 30 22 - 32 mmol/L   Glucose, Bld 154 (H) 65 - 99 mg/dL   BUN 22 (H) 6 - 20 mg/dL   Creatinine, Ser 0.83 0.61 - 1.24 mg/dL   Calcium 7.7 (L) 8.9 - 10.3 mg/dL   GFR calc non Af Amer >60 >60 mL/min   GFR calc Af Amer >60 >60 mL/min    Comment: (NOTE) The eGFR has been calculated using the CKD EPI equation. This calculation has not been validated in all clinical situations. eGFR's persistently <60 mL/min signify possible Chronic Kidney Disease.    Anion gap 9 5 - 15  Glucose, capillary     Status: Abnormal   Collection Time: 10/02/17  7:48 AM  Result Value Ref Range   Glucose-Capillary 102 (H) 65 - 99 mg/dL   Comment 1 Notify RN    Comment 2 Document in Chart     Dg Chest Port 1 View  Result Date: 09/30/2017 CLINICAL DATA:  Shortness of Breath EXAM: PORTABLE CHEST 1 VIEW COMPARISON:  09/28/2017 FINDINGS: Cardiac shadow is mildly enlarged but stable. Lungs are hypoinflated without focal infiltrate or sizable effusion. Postsurgical changes in the cervical spine are noted. Previously seen changes in the left base have cleared in the interval. IMPRESSION: Significant improved aeration  without focal infiltrate at this time. Electronically Signed   By: Inez Catalina M.D.   On:  09/30/2017 15:52    Review of Systems  Constitutional: Positive for chills, diaphoresis and malaise/fatigue. Negative for fever and weight loss.  HENT: Negative for ear pain, hearing loss and tinnitus.   Eyes: Negative for blurred vision and double vision.  Respiratory: Positive for shortness of breath.   Cardiovascular: Negative for chest pain, palpitations, orthopnea and claudication.  Gastrointestinal: Positive for abdominal pain and heartburn. Negative for constipation, diarrhea, nausea and vomiting.  Genitourinary: Negative.   Musculoskeletal: Negative.   Skin: Positive for rash (Left arm ecchymosis).  Neurological: Positive for dizziness, tremors and weakness. Negative for focal weakness, seizures and loss of consciousness.  Psychiatric/Behavioral: Negative for depression and suicidal ideas.  All other systems reviewed and are negative.  Blood pressure 140/87, pulse 87, temperature 97.9 F (36.6 C), temperature source Oral, resp. rate (!) 22, height 5' 9" (1.753 m), weight 85.3 kg (188 lb 0.8 oz), SpO2 96 %.   Physical Exam  Constitutional: He is oriented to person, place, and time. He appears well-developed and well-nourished.  HENT:  Head: Atraumatic.  Eyes: Conjunctivae are normal.  Neck: Neck supple.  Cardiovascular: Exam reveals no friction rub.  No murmur heard. S1 variable, S2 normal, Distant heart sounds, Tachycardia present and no gallop appreciated  Respiratory: Effort normal and breath sounds normal.  GI: Soft. Bowel sounds are normal.  Musculoskeletal: Normal range of motion.  Neurological: He is alert and oriented to person, place, and time.  Skin: Skin is warm.  Left arm ecchymosis noted  Psychiatric: He has a normal mood and affect.     Scheduled Meds: . allopurinol  200 mg Oral Daily  . apixaban  5 mg Oral BID  . aspirin EC  81 mg Oral Daily  . chlorhexidine  15 mL Mouth Rinse BID  . feeding supplement (ENSURE ENLIVE)  237 mL Oral BID BM  . folic  acid  1 mg Oral Daily  . furosemide  40 mg Intravenous Daily  . ipratropium  0.5 mg Nebulization TID   And  . levalbuterol  0.63 mg Nebulization TID  . mouth rinse  15 mL Mouth Rinse q12n4p  . metoprolol tartrate  5 mg Intravenous Q6H  . multivitamin with minerals  1 tablet Oral Daily  . pantoprazole  40 mg Oral Q1200  . pneumococcal 23 valent vaccine  0.5 mL Intramuscular Tomorrow-1000  . potassium chloride  40 mEq Oral Daily  . predniSONE  40 mg Oral Q breakfast  . senna-docusate  1 tablet Oral BID  . sodium chloride flush  3 mL Intravenous Q12H  . thiamine  100 mg Oral Daily   Continuous Infusions: . sodium chloride    . bivalirudin (ANGIOMAX) infusion 0.5 mg/mL (Non-ACS indications) 0.18 mg/kg/hr (10/02/17 0142)  . cefTRIAXone (ROCEPHIN)  IV Stopped (10/01/17 2310)  . diltiazem (CARDIZEM) infusion 10 mg/hr (10/02/17 0700)   PRN Meds:.sodium chloride, acetaminophen **OR** acetaminophen, fentaNYL (SUBLIMAZE) injection, gi cocktail, levalbuterol, LORazepam, ondansetron **OR** ondansetron (ZOFRAN) IV, polyethylene glycol, sodium chloride, sodium chloride flush  Cardiac studies:  EKG 09/26/2017: Atrial fibrillation with controlled ventricular response at the rate of 74 bpm, inferior infarct old, poor R-wave progression, cannot exclude anterolateral infarct.  Frequent PVCs.  Echocardiogram 10/02/2017: Low normal LVEF, EF 50%, mild pulmonary hypertension  Assessment/Plan: 1. A. Fib with RVR due to underlying sepsis. CHA2DS2-VASCScore: Risk Score  3,  Yearly risk of stroke  3.2. Recommendation: ASA NO/Anticoagulation  Yes 2. H/O Alcoholic cardiomyopathy.  3. H/O Liver cirrohosis. Abnormal liver enzymes 4. Klebsiella pneumonia and LLL pneumonia 5. THrombocytopenia appears to be due to sepsis now back on oral anticoagulation and platelets improving  Rec: Will add Metoprolol PO and diltiazem PO today and continue IV for now. Add dig for rate control and will plan on stopping dig once  sepsis is controlled. No clinical e/o CHF. Once   Adrian Prows 10/02/2017, 11:13 AM

## 2017-10-03 LAB — CULTURE, BLOOD (ROUTINE X 2)
Culture: NO GROWTH
Culture: NO GROWTH
SPECIAL REQUESTS: ADEQUATE
Special Requests: ADEQUATE

## 2017-10-03 LAB — CBC
HEMATOCRIT: 35.1 % — AB (ref 39.0–52.0)
Hemoglobin: 11.9 g/dL — ABNORMAL LOW (ref 13.0–17.0)
MCH: 34.3 pg — ABNORMAL HIGH (ref 26.0–34.0)
MCHC: 33.9 g/dL (ref 30.0–36.0)
MCV: 101.2 fL — AB (ref 78.0–100.0)
PLATELETS: 185 10*3/uL (ref 150–400)
RBC: 3.47 MIL/uL — ABNORMAL LOW (ref 4.22–5.81)
RDW: 13 % (ref 11.5–15.5)
WBC: 14.1 10*3/uL — AB (ref 4.0–10.5)

## 2017-10-03 MED ORDER — POTASSIUM CHLORIDE CRYS ER 20 MEQ PO TBCR
40.0000 meq | EXTENDED_RELEASE_TABLET | Freq: Two times a day (BID) | ORAL | Status: DC
  Administered 2017-10-03 – 2017-10-06 (×6): 40 meq via ORAL
  Filled 2017-10-03 (×6): qty 2

## 2017-10-03 MED ORDER — LEVALBUTEROL HCL 0.63 MG/3ML IN NEBU
0.6300 mg | INHALATION_SOLUTION | Freq: Two times a day (BID) | RESPIRATORY_TRACT | Status: DC
  Administered 2017-10-03 – 2017-10-06 (×6): 0.63 mg via RESPIRATORY_TRACT
  Filled 2017-10-03 (×6): qty 3

## 2017-10-03 MED ORDER — DIGOXIN 125 MCG PO TABS
0.1250 mg | ORAL_TABLET | Freq: Every day | ORAL | Status: DC
  Administered 2017-10-04: 0.125 mg via ORAL
  Filled 2017-10-03: qty 1

## 2017-10-03 MED ORDER — IPRATROPIUM BROMIDE 0.02 % IN SOLN
0.5000 mg | Freq: Two times a day (BID) | RESPIRATORY_TRACT | Status: DC
  Administered 2017-10-03 – 2017-10-06 (×6): 0.5 mg via RESPIRATORY_TRACT
  Filled 2017-10-03 (×6): qty 2.5

## 2017-10-03 MED ORDER — CEFUROXIME AXETIL 500 MG PO TABS
500.0000 mg | ORAL_TABLET | Freq: Two times a day (BID) | ORAL | Status: AC
  Administered 2017-10-03 – 2017-10-04 (×3): 500 mg via ORAL
  Filled 2017-10-03 (×3): qty 1

## 2017-10-03 NOTE — Progress Notes (Signed)
Subjective:  Still has generalized weakness. Constipation has resolved.  Patient with h/o chronic atrial fibrillation and on chronic anticoagulatin, H/O excessive alcohol intake, hypertension, cirrhosis of the liver by history, prior tobacco use, admitted with abdominal discomfort and sepsis.   Objective:  Vital Signs in the last 24 hours: Temp:  [97.5 F (36.4 C)-98.2 F (36.8 C)] 97.7 F (36.5 C) (11/21 0808) Pulse Rate:  [54-139] 75 (11/21 0852) Resp:  [20-27] 27 (11/20 2337) BP: (106-126)/(63-87) 126/69 (11/21 0808) SpO2:  [94 %-99 %] 99 % (11/21 0732) Weight:  [85.3 kg (188 lb 0.8 oz)] 85.3 kg (188 lb 0.8 oz) (11/21 0427)  Intake/Output from previous day: 11/20 0701 - 11/21 0700 In: 880.6 [P.O.:720; I.V.:160.6] Out: 1826 [Urine:1825; Stool:1] Intake/Output from this shift: No intake/output data recorded.  Physical Exam: Blood pressure 126/69, pulse 75, temperature 97.7 F (36.5 C), temperature source Oral, resp. rate (!) 27, height 5\' 9"  (1.753 m), weight 85.3 kg (188 lb 0.8 oz), SpO2 99 %.  Constitutional: He is oriented to person, place, and time. He appears well-developed and well-nourished.  HENT:  Head: Atraumatic.  Eyes: Conjunctivae are normal.  Neck: Neck supple.  Cardiovascular: Exam reveals no friction rub.  No murmur heard. S1 variable, S2 normal, Distant heart sounds, Tachycardia present and no gallop appreciated  Respiratory: Effort normal and faint scattered crackles present.  GI: Soft. Bowel sounds are normal.  Musculoskeletal: Normal range of motion.  Neurological: He is alert and oriented to person, place, and time.  Skin: Skin is warm. Left arm ecchymosis noted  Psychiatric: Appears anxious.  Lab Results: Recent Labs    10/02/17 0236 10/03/17 0300  WBC 13.2* 14.1*  HGB 11.6* 11.9*  PLT 131* 185   Recent Labs    10/01/17 0149 10/02/17 0236  NA 134* 135  K 3.8 3.1*  CL 100* 96*  CO2 26 30  GLUCOSE 173* 154*  BUN 21* 22*  CREATININE 0.73  0.83   Imaging: No results found.  Cardiac Studies: EKG 09/26/2017: Atrial fibrillation with controlled ventricular response at the rate of 74 bpm, inferior infarct old, poor R-wave progression, cannot exclude anterolateral infarct.  Frequent PVCs.  Telemetry 10/03/2017: A. Fib with controlled ventricular response. Frequent PVC.   Echocardiogram 10/02/2017: Low normal LVEF, EF 50%, mild pulmonary hypertension  Assessment/Plan: 1. A. Fib with RVR due to underlying sepsis. CHA2DS2-VASCScore: Risk Score  3,  Yearly risk of stroke  3.2. Recommendation: ASA NO/Anticoagulation Yes 2. H/O Alcoholic cardiomyopathy, low normal LVEF.  3. H/O Liver cirrohosis. Abnormal liver enzymes 4. Klebsiella pneumonia and LLL pneumonia 5. Thrombocytopenia appears to be due to sepsis now back on oral anticoagulation and ASA.  Rec: Patient's heart rate is now well controlled on diltiazem, metoprolol.  Continue the same, blood pressure is also stable.  He was also started on digoxin yesterday, I will reduce the dose from 0.25 mg to 0.125 mg daily for now.  Once axis is all, I suspect he can come off of digoxin, prior to discharge would recommend discontinuing digoxin.  We'll discontinue aspirin due to thrombocytopenia and also patient being on chronic anticoagulation to reduce the risk of bleeding. Needs PT/OT   LOS: 7 days    Adrian Prows 10/03/2017, 9:30 AM

## 2017-10-03 NOTE — Progress Notes (Signed)
Received report on pt.

## 2017-10-03 NOTE — Progress Notes (Signed)
  Speech Language Pathology Treatment: Dysphagia  Patient Details Name: Samuel Flores MRN: [id number] DOB: September 09, 1941 Today's Date: 10/03/2017 Time: 5643-3295 SLP Time Calculation (min) (ACUTE ONLY): 17 min  Assessment / Plan / Recommendation Clinical Impression  Pt consumed solids and liquids with no obvious increase in shortness of breath and no overt s/s of aspiration, even upon challenging. He dislikes his current diet and wants to be advanced. Recommend advancement to regular textures, continuing thin liquids. SLP to sign off.   HPI HPI: Pt is a 76 y.o. male with PMH of EtOH use daily, CAD, gout, A.Fib rate controlled on eliquis. Patient presented to the ED 11/14with c/o central chest and epigastric pain, symptoms onset while resting at about 7:30pm. Pale on EMS arrival. ASA and NTG given and SBP dropped to 90s. Ongoing chest, epigastric pain, as well as nausea. Has Klebsiella pneumoniae bacteremia- unknown source. CXR showed Airspace consolidation consistent with pneumonia left lower lobe with left pleural effusion. Right lung clear. Had been on clear liquid diet; advanced to dysphagia 2 today. RN reported pt coughing at times during PO intake; seems to have more trouble with straws. Bedside swallow eval ordered.      SLP Plan  All goals met       Recommendations  Diet recommendations: Regular;Thin liquid Liquids provided via: Straw;Cup Medication Administration: Whole meds with liquid Supervision: Patient able to self feed Compensations: Slow rate;Small sips/bites;Minimize environmental distractions Postural Changes and/or Swallow Maneuvers: Seated upright 90 degrees                Oral Care Recommendations: Oral care BID Follow up Recommendations: None SLP Visit Diagnosis: Dysphagia, unspecified (R13.10) Plan: All goals met       GO                Germain Osgood 10/03/2017, 4:07 PM  Germain Osgood, M.A. CCC-SLP [phone]

## 2017-10-03 NOTE — Progress Notes (Signed)
Pt transferred to unit. Pt A&O x4. Call bell within reach. Will continue to assess.

## 2017-10-03 NOTE — Progress Notes (Signed)
PROGRESS NOTE [address]   Samuel Flores   VZD:[id number] DOB: Apr 02, 1941  DOA: 09/26/2017 PCP: Alycia Rossetti, MD   Brief Narrative:  Samuel Flores 76 y.o.malewith history ofdaily EtOH use, CAD, gout, and A.Fib on eliquis who presented to the ED with acute onset central chest and epigastric pain. On EMS arrival ASA and NTG given and SBP dropped to 90s. In the ED Trop was negative, EKG was w/o acute changes, and CTa demonstrated no dissection nor PE but did show mild inflamation around the CBD and gallbladder. RUQ [place] was unremarkable. Patient was found to have epigastric and RUQ tenderness. Lipase neg. AST 192, ALT 80, Bili 2.7  Patient was found to have Klebsiella bacteremia felt to be due to pneumonia. Patient Also developed afib and cardiology was consulted.   Subjective: Patient seen and examined, doing significantly better. HR better controlled with cardizem and metoprolol   Assessment & Plan: Severe sepsis due to Klebsiella bacteremia - LLL PNA  Suspected from pneumonia initial xray showed left basilar opacity - repeated CXR today confirm PNA  CT abd with possible cholecystitis, [place] no gallstones or wall thickening no sonographic Murphy sign. Initially treated with zosyn, narrowed to Rocephin 11/14 will switch to Ceftin for 3 more days.   Acute metabolic encephalopathy  Now at baseline -  ? 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  MRI brain negative for stroke  Avoid sedating agents   Treat underlying causes   Suspected HIT  Patient was placed on Angiomax and heparin was d/ced  Doppler US negative for DVT or SVT in left upper extremity  HIT antibodies negative   Angiomax d/ced -started on Eliquis   Chronic Afib with RVR Initially on Cardizem ggt, now on Cardizem PO, Dig and Metoprolol  Dr [name] following  S/p failed DCCV (ganji) 4//2018 Angiomax d/ced -started on Eliquis    Acute on chronic diastolic CHF  Chest x-ray showed airspace  consolidation with pneumonia left lower lobe and left pleural effusion. Treated with IV lasix 40 mg  No seem overloaded, hold Lasix for today   Elevated liver enzymes  Likely due to EtOH abuse and shock liver from sepsis  LFT's improved   ETOH abuse Alcohol cessation discussed   AKI - Resolved   DVT prophylaxis: Eliquis  Code Status: Full code  Family Communication: None at bedside  Disposition Plan: Home in the next 24-48 hrs   Consultants:   None   Procedures:   None  Antimicrobials: Anti-infectives (From admission, onward)   Start     Dose/Rate Route Frequency Ordered Stop   09/26/17 2200  cefTRIAXone (ROCEPHIN) 2 g in dextrose 5 % 50 mL IVPB     2 g 100 mL/hr over 30 Minutes Intravenous Every 24 hours 09/26/17 2000     09/26/17 1200  piperacillin-tazobactam (ZOSYN) IVPB 3.375 g  Status:  Discontinued     3.375 g 12.5 mL/hr over 240 Minutes Intravenous Every 8 hours 09/26/17 0449 09/26/17 1958   09/26/17 0400  piperacillin-tazobactam (ZOSYN) IVPB 3.375 g     3.375 g 100 mL/hr over 30 Minutes Intravenous  Once 09/26/17 0353 09/26/17 0554      Objective: Vitals:   10/03/17 0427 10/03/17 0732 10/03/17 0808 10/03/17 0852  BP:   126/69   Pulse:    75  Resp:      Temp:   97.7 F (36.5 C)   TempSrc:   Oral   SpO2:  99%    Weight: 85.3 kg (188  lb 0.8 oz)     Height:        Intake/Output Summary (Last 24 hours) at 10/03/2017 0925 Last data filed at 10/03/2017 0700 Gross per 24 hour  Intake 801.8 ml  Output 1626 ml  Net -824.2 ml   Filed Weights   10/01/17 0326 10/02/17 0453 10/03/17 0427  Weight: 87.8 kg (193 lb 9 oz) 85.3 kg (188 lb 0.8 oz) 85.3 kg (188 lb 0.8 oz)    Examination:  General: NAD Cardiovascular: RRR, S1/S2 +, no rubs, no gallops Respiratory: CTA bilaterally, no wheezing, no rhonchi Abdominal: Soft, NT, ND, bowel sounds + Extremities: no edema Skin: No rash noted, left arm discoloration    Data Reviewed: I have personally reviewed  following labs and imaging studies  CBC: Recent Labs  Lab 09/29/17 0215 09/30/17 0236 10/01/17 0149 10/02/17 0236 10/03/17 0300  WBC 8.9 13.1* 9.7 13.2* 14.1*  HGB 11.7* 11.3* 12.0* 11.6* 11.9*  HCT 33.9* 32.9* 35.2* 33.0* 35.1*  MCV 101.8* 101.9* 101.1* 100.3* 101.2*  PLT 66* 84* 96* 131* 161   Basic Metabolic Panel: Recent Labs  Lab 09/28/17 0155 09/28/17 0951 09/29/17 0215 09/30/17 0236 10/01/17 0149 10/02/17 0236  NA 134*  --  136 136 134* 135  K 4.2  --  3.4* 4.1 3.8 3.1*  CL 105  --  102 102 100* 96*  CO2 22  --  24 26 26 30   GLUCOSE 113*  --  219* 139* 173* 154*  BUN 18  --  23* 20 21* 22*  CREATININE 0.92  --  0.93 0.72 0.73 0.83  CALCIUM 6.8*  --  7.3* 7.2* 7.4* 7.7*  MG  --  1.5* 1.7  --   --   --    GFR: Estimated Creatinine Clearance: 79.4 mL/min (by C-G formula based on SCr of 0.83 mg/dL). Liver Function Tests: Recent Labs  Lab 09/27/17 0813 09/28/17 0155 09/29/17 0215  AST 155*  160* 128* 85*  ALT 106*  108* 92* 79*  ALKPHOS 135*  138* 122 129*  BILITOT 7.8*  8.1* 7.2* 4.0*  PROT 5.3*  5.0* 4.7* 5.4*  ALBUMIN 2.5*  2.5* 2.3* 2.5*   No results for input(s): LIPASE, AMYLASE in the last 168 hours. No results for input(s): AMMONIA in the last 168 hours. Coagulation Profile: No results for input(s): INR, PROTIME in the last 168 hours. Cardiac Enzymes: No results for input(s): CKTOTAL, CKMB, CKMBINDEX, TROPONINI in the last 168 hours. BNP (last 3 results) No results for input(s): PROBNP in the last 8760 hours. HbA1C: No results for input(s): HGBA1C in the last 72 hours. CBG: Recent Labs  Lab 10/02/17 0748  GLUCAP 102*   Lipid Profile: No results for input(s): CHOL, HDL, LDLCALC, TRIG, CHOLHDL, LDLDIRECT in the last 72 hours. Thyroid Function Tests: No results for input(s): TSH, T4TOTAL, FREET4, T3FREE, THYROIDAB in the last 72 hours. Anemia Panel: No results for input(s): VITAMINB12, FOLATE, FERRITIN, TIBC, IRON, RETICCTPCT in the  last 72 hours. Sepsis Labs: Recent Labs  Lab 09/27/17 0813  LATICACIDVEN 1.5    Recent Results (from the past 240 hour(s))  Blood culture (routine x 2)     Status: Abnormal   Collection Time: 09/26/17  5:00 AM  Result Value Ref Range Status   Specimen Description BLOOD RIGHT ANTECUBITAL  Final   Special Requests   Final    BOTTLES DRAWN AEROBIC AND ANAEROBIC Blood Culture results may not be optimal due to an excessive volume of blood received in culture bottles  Culture  Setup Time   Final    GRAM NEGATIVE RODS IN BOTH AEROBIC AND ANAEROBIC BOTTLES CRITICAL RESULT CALLED TO, READ BACK BY AND VERIFIED WITH: Mardella Layman PHARMD 1941 09/26/17 A BROWNING    Culture (A)  Final    KLEBSIELLA PNEUMONIAE SUSCEPTIBILITIES PERFORMED ON PREVIOUS CULTURE WITHIN THE LAST 5 DAYS.    Report Status 09/28/2017 FINAL  Final  Blood culture (routine x 2)     Status: Abnormal   Collection Time: 09/26/17  5:10 AM  Result Value Ref Range Status   Specimen Description BLOOD RIGHT HAND  Final   Special Requests   Final    BOTTLES DRAWN AEROBIC AND ANAEROBIC Blood Culture results may not be optimal due to an excessive volume of blood received in culture bottles   Culture  Setup Time   Final    GRAM NEGATIVE RODS IN BOTH AEROBIC AND ANAEROBIC BOTTLES CRITICAL RESULT CALLED TO, READ BACK BY AND VERIFIED WITH: Mardella Layman PHARMD 1941 09/26/17 A BROWNING    Culture KLEBSIELLA PNEUMONIAE (A)  Final   Report Status 09/28/2017 FINAL  Final   Organism ID, Bacteria KLEBSIELLA PNEUMONIAE  Final      Susceptibility   Klebsiella pneumoniae - MIC*    AMPICILLIN RESISTANT Resistant     CEFAZOLIN <=4 SENSITIVE Sensitive     CEFEPIME <=1 SENSITIVE Sensitive     CEFTAZIDIME <=1 SENSITIVE Sensitive     CEFTRIAXONE <=1 SENSITIVE Sensitive     CIPROFLOXACIN <=0.25 SENSITIVE Sensitive     GENTAMICIN <=1 SENSITIVE Sensitive     IMIPENEM <=0.25 SENSITIVE Sensitive     TRIMETH/SULFA <=20 SENSITIVE Sensitive      AMPICILLIN/SULBACTAM <=2 SENSITIVE Sensitive     PIP/TAZO <=4 SENSITIVE Sensitive     Extended ESBL NEGATIVE Sensitive     * KLEBSIELLA PNEUMONIAE  Blood Culture ID Panel (Reflexed)     Status: Abnormal   Collection Time: 09/26/17  5:10 AM  Result Value Ref Range Status   Enterococcus species NOT DETECTED NOT DETECTED Final   Listeria monocytogenes NOT DETECTED NOT DETECTED Final   Staphylococcus species NOT DETECTED NOT DETECTED Final   Staphylococcus aureus NOT DETECTED NOT DETECTED Final   Streptococcus species NOT DETECTED NOT DETECTED Final   Streptococcus agalactiae NOT DETECTED NOT DETECTED Final   Streptococcus pneumoniae NOT DETECTED NOT DETECTED Final   Streptococcus pyogenes NOT DETECTED NOT DETECTED Final   Acinetobacter baumannii NOT DETECTED NOT DETECTED Final   Enterobacteriaceae species DETECTED (A) NOT DETECTED Final    Comment: Enterobacteriaceae represent a large family of gram-negative bacteria, not a single organism. CRITICAL RESULT CALLED TO, READ BACK BY AND VERIFIED WITH: Mardella Layman PHARMD [id number] 09/26/17 A BROWNING    Enterobacter cloacae complex NOT DETECTED NOT DETECTED Final   Escherichia coli NOT DETECTED NOT DETECTED Final   Klebsiella oxytoca NOT DETECTED NOT DETECTED Final   Klebsiella pneumoniae DETECTED (A) NOT DETECTED Final    Comment: CRITICAL RESULT CALLED TO, READ BACK BY AND VERIFIED WITH: Mardella Layman PHARMD 1941 09/26/17 A BROWNING    Proteus species NOT DETECTED NOT DETECTED Final   Serratia marcescens NOT DETECTED NOT DETECTED Final   Carbapenem resistance NOT DETECTED NOT DETECTED Final   Haemophilus influenzae NOT DETECTED NOT DETECTED Final   Neisseria meningitidis NOT DETECTED NOT DETECTED Final   Pseudomonas aeruginosa NOT DETECTED NOT DETECTED Final   Candida albicans NOT DETECTED NOT DETECTED Final   Candida glabrata NOT DETECTED NOT DETECTED Final   Candida krusei NOT DETECTED  NOT DETECTED Final   Candida parapsilosis NOT DETECTED NOT  DETECTED Final   Candida tropicalis NOT DETECTED NOT DETECTED Final  Culture, Urine     Status: None   Collection Time: 09/26/17  4:44 PM  Result Value Ref Range Status   Specimen Description URINE, CLEAN CATCH  Final   Special Requests NONE  Final   Culture NO GROWTH  Final   Report Status 09/27/2017 FINAL  Final  MRSA PCR Screening     Status: None   Collection Time: 09/26/17  8:42 PM  Result Value Ref Range Status   MRSA by PCR NEGATIVE NEGATIVE Final    Comment:        The GeneXpert MRSA Assay (FDA approved for NASAL specimens only), is one component of a comprehensive MRSA colonization surveillance program. It is not intended to diagnose MRSA infection nor to guide or monitor treatment for MRSA infections.   Culture, blood (Routine X 2) w Reflex to ID Panel     Status: None (Preliminary result)   Collection Time: 09/28/17  7:13 PM  Result Value Ref Range Status   Specimen Description BLOOD RIGHT ARM  Final   Special Requests   Final    BOTTLES DRAWN AEROBIC ONLY Blood Culture adequate volume   Culture NO GROWTH 4 DAYS  Final   Report Status PENDING  Incomplete  Culture, blood (Routine X 2) w Reflex to ID Panel     Status: None (Preliminary result)   Collection Time: 09/28/17  7:16 PM  Result Value Ref Range Status   Specimen Description BLOOD RIGHT HAND  Final   Special Requests   Final    BOTTLES DRAWN AEROBIC ONLY Blood Culture adequate volume   Culture NO GROWTH 4 DAYS  Final   Report Status PENDING  Incomplete      Radiology Studies: No results found.    Scheduled Meds: . allopurinol  200 mg Oral Daily  . apixaban  5 mg Oral BID  . aspirin EC  81 mg Oral Daily  . chlorhexidine  15 mL Mouth Rinse BID  . digoxin  0.25 mg Oral Daily  . diltiazem  30 mg Oral Q6H  . feeding supplement (ENSURE ENLIVE)  237 mL Oral BID BM  . folic acid  1 mg Oral Daily  . ipratropium  0.5 mg Nebulization BID   And  . levalbuterol  0.63 mg Nebulization BID  . mouth rinse   15 mL Mouth Rinse q12n4p  . metoprolol tartrate  25 mg Oral Q6H  . multivitamin with minerals  1 tablet Oral Daily  . pantoprazole  40 mg Oral Q1200  . pneumococcal 23 valent vaccine  0.5 mL Intramuscular Tomorrow-1000  . potassium chloride  40 mEq Oral Daily  . predniSONE  40 mg Oral Q breakfast  . senna-docusate  1 tablet Oral BID  . sodium chloride flush  3 mL Intravenous Q12H  . thiamine  100 mg Oral Daily   Continuous Infusions: . sodium chloride    . cefTRIAXone (ROCEPHIN)  IV Stopped (10/02/17 2325)     LOS: 7 days    Time spent: Total of 25 minutes spent with pt, greater than 50% of which was spent in discussion of  treatment, counseling and coordination of care   Chipper Oman, MD Pager: Text Page via [email or web address]   If 7PM-7AM, please contact night-coverage [email or web address] 10/03/2017, 9:25 AM

## 2017-10-03 NOTE — Progress Notes (Signed)
Physical Therapy Treatment Patient Details Name: Samuel Flores MRN: [id number] DOB: 05/18/1941 Today's Date: 10/03/2017    History of Present Illness Pt is a 76 y.o. male who presented to ED on 09/26/17 acute onset central chest and epigastric pain. pt worked up for severe sepsis due to Klebsiella bactermia, suspected from pneumonia in LLL. CT head showed hypoattenuation in the left pons possible artifact. MRI of the brain negative for acute stroke. Pt also with chronic a-fib with RVR. Other pertinent PMH includes AKI, EtOH use, CAD.    PT Comments    Pt progressing well with mobility. Continues to remain limited by c/o dizziness and BP changes with transitions (see values below), but able to ambulate with RW and intermittent min guard (+ chair follow for safety). Wife present during session, and discussed need for supervision, RW, and HHPT. Pt remains discouraged by his current medical situation, but is motivated to participate with PT.  Supine BP 119/60 Sitting BP 108/72 Standing BP 98/59 Return to sit BP 105/69 Post-amb 132/68    Follow Up Recommendations  Home health PT;Supervision/Assistance - 24 hour     Equipment Recommendations  None recommended by PT(owns RW)    Recommendations for Other Services       Precautions / Restrictions Precautions Precautions: Fall Restrictions Weight Bearing Restrictions: No    Mobility  Bed Mobility Overal bed mobility: Needs Assistance Bed Mobility: Supine to Sit     Supine to sit: Supervision;HOB elevated Sit to supine: Min guard   General bed mobility comments: C/o slight dizziness in sitting  Transfers Overall transfer level: Needs assistance Equipment used: Rolling walker (2 wheeled) Transfers: Sit to/from Stand Sit to Stand: Min guard;Supervision         General transfer comment: Cues for hand placement on RW; stood from bed and 2x from chair with good technique, progressing to supervision for safety.    Ambulation/Gait Ambulation/Gait assistance: Min guard Ambulation Distance (Feet): 150 Feet Assistive device: Rolling walker (2 wheeled) Gait Pattern/deviations: Step-through pattern;Decreased stride length     General Gait Details: Min guard (+ chair follow) only secondary to c/o dizziness; overall, pt moving well. Actually required cues to slow down in order to keep close chair follow in case he needed to sit   Stairs            Wheelchair Mobility    Modified Rankin (Stroke Patients Only)       Balance Overall balance assessment: Needs assistance Sitting-balance support: No upper extremity supported;Feet unsupported;Feet supported Sitting balance-Leahy Scale: Good     Standing balance support: Bilateral upper extremity supported;No upper extremity supported Standing balance-Leahy Scale: Fair Standing balance comment: Can static stand with no UE support; reliant on BUE support for amb                            Cognition Arousal/Alertness: Awake/alert Behavior During Therapy: WFL for tasks assessed/performed Overall Cognitive Status: Within Functional Limits for tasks assessed                                 General Comments: Pt admitting he feels depressed about his situation and was receptive to a listening ear; when asked if he would like to speak with anyone (i.e. chaplain) he declined; states he talks to his wife      Exercises      General Comments General comments (skin  integrity, edema, etc.): Wife present; discussed need for supervision, RW, and HHPT      Pertinent Vitals/Pain Pain Assessment: No/denies pain    Home Living Family/patient expects to be discharged to:: Private residence Living Arrangements: Spouse/significant other Available Help at Discharge: Family;Available 24 hours/day(wife with recent knee surgery) Type of Home: House Home Access: Level entry   Home Layout: Two level;Bed/bath upstairs Home  Equipment: Walker - 2 wheels      Prior Function Level of Independence: Independent      Comments: Reports that he limits his activity due to weakness. Pt drives. Reported to OT that he does not use RW but to PT reported intermittent use of cane.    PT Goals (current goals can now be found in the care plan section) Acute Rehab PT Goals Patient Stated Goal: Return home PT Goal Formulation: With patient Time For Goal Achievement: 10/16/17 Potential to Achieve Goals: Good Progress towards PT goals: Progressing toward goals    Frequency    Min 3X/week      PT Plan Current plan remains appropriate    Co-evaluation              AM-PAC PT "6 Clicks" Daily Activity  Outcome Measure  Difficulty turning over in bed (including adjusting bedclothes, sheets and blankets)?: None Difficulty moving from lying on back to sitting on the side of the bed? : None Difficulty sitting down on and standing up from a chair with arms (e.g., wheelchair, bedside commode, etc,.)?: A Little Help needed moving to and from a bed to chair (including a wheelchair)?: A Little Help needed walking in hospital room?: A Little Help needed climbing 3-5 steps with a railing? : A Lot 6 Click Score: 19    End of Session Equipment Utilized During Treatment: Gait belt Activity Tolerance: Patient tolerated treatment well Patient left: in chair;with call bell/phone within reach;with family/visitor present Nurse Communication: Mobility status;Other (comment)(BP fluctuations) PT Visit Diagnosis: Other abnormalities of gait and mobility (R26.89)     Time: 9373-4287 PT Time Calculation (min) (ACUTE ONLY): 40 min  Charges:  $Gait Training: 8-22 mins $Therapeutic Activity: 23-37 mins                    G Codes:      Mabeline Caras, PT, DPT Acute Rehab Services  Pager: [place] 10/03/2017, 1:51 PM

## 2017-10-03 NOTE — Progress Notes (Signed)
Pt. Concerned bc he has had three liquid stools. Did not receive senokot today due to concerns

## 2017-10-03 NOTE — Evaluation (Signed)
Occupational Therapy Evaluation Patient Details Name: Samuel Flores MRN: [id number] DOB: 11/30/1940 Today's Date: 10/03/2017    History of Present Illness Pt is a 76 y.o. male who presented to ED on 09/26/17 acute onset central chest and epigastric pain. pt worked up for severe sepsis due to Klebsiella bactermia, suspected from pneumonia in LLL. CT head showed hypoattenuation in the left pons possible artifact. MRI of the brain negative for acute stroke. Pt also with chronic a-fib with RVR. Other pertinent PMH includes AKI, EtOH use, CAD,    Clinical Impression   PTA, pt reports independence with ADL and functional mobility. Note his report to PT previous date that he was intermittently utilizing RW although he reports no use of AD to OT. Pt reports significant fatigue at home and limiting his activity. He was able to complete LB ADL with min assist, UB ADL with min guard assist, and sit<>stand transfer in preparation for ADL with min assist and RW. Pt with significant drop in BP in both seated and standing positions requiring return to supine to improve (see ADL comments below). Pt would benefit from continued OT services while admitted to improve independence and safety with ADL and functional mobility prior to returning home. Feel once BP is under control, pt will progress well and recommend home health OT follow-up post-acute D/C.     Follow Up Recommendations  Home health OT;Supervision/Assistance - 24 hour    Equipment Recommendations  3 in 1 bedside commode    Recommendations for Other Services       Precautions / Restrictions Precautions Precautions: Fall Restrictions Weight Bearing Restrictions: No      Mobility Bed Mobility Overal bed mobility: Needs Assistance Bed Mobility: Supine to Sit;Sit to Supine     Supine to sit: Supervision;HOB elevated Sit to supine: Min guard   General bed mobility comments: Min guard assist for safety for return to supine due to low BP  and dizziness.   Transfers Overall transfer level: Needs assistance Equipment used: Rolling walker (2 wheeled) Transfers: Sit to/from Stand Sit to Stand: Min assist         General transfer comment: Min assist to steady and lift. Able to stand for approximately 30 seconds but required sitting due to dizziness and drop in BP.     Balance Overall balance assessment: Needs assistance Sitting-balance support: No upper extremity supported;Feet unsupported;Feet supported Sitting balance-Leahy Scale: Good     Standing balance support: Bilateral upper extremity supported;No upper extremity supported Standing balance-Leahy Scale: Poor Standing balance comment: Requiring B UE support this session.                            ADL either performed or assessed with clinical judgement   ADL Overall ADL's : Needs assistance/impaired Eating/Feeding: Set up;Sitting   Grooming: Set up;Sitting   Upper Body Bathing: Min guard;Sitting   Lower Body Bathing: Minimal assistance;Sit to/from stand   Upper Body Dressing : Min guard;Sitting   Lower Body Dressing: Minimal assistance;Sit to/from stand   Toilet Transfer: Minimal Insurance claims handler Details (indicate cue type and reason): Only able to complete sit<>stand this session due to drop in BP and needed to return to supine.            General ADL Comments: Pt very discouraged concerning issues with dizziness and low BP. Able to sit at EOB for ADL tasks and complete sit<>stand in hopes to ambulate to sink for oral care.  However, pt with significant drop in BP and returned to supine with near immediate increase. BP supine prior to activity: 134/83, once sitting at EOB: 114/67, after 2 minutes seated at EOB 115/74, on standing (completed once returned to sit): 69/57, and on return to supine 119/74.     Vision Patient Visual Report: No change from baseline Vision Assessment?: No apparent visual deficits     Perception      Praxis      Pertinent Vitals/Pain       Hand Dominance     Extremity/Trunk Assessment Upper Extremity Assessment Upper Extremity Assessment: Generalized weakness(L UE bruising)   Lower Extremity Assessment Lower Extremity Assessment: Generalized weakness       Communication Communication Communication: No difficulties   Cognition Arousal/Alertness: Awake/alert Behavior During Therapy: WFL for tasks assessed/performed Overall Cognitive Status: Within Functional Limits for tasks assessed                                 General Comments: Pt very disappointed that he has not been able to walk due to blood pressure problems.   General Comments       Exercises     Shoulder Instructions      Home Living Family/patient expects to be discharged to:: Private residence Living Arrangements: Spouse/significant other Available Help at Discharge: Family;Available 24 hours/day(wife with recent knee surgery) Type of Home: House Home Access: Level entry     Home Layout: Two level;Bed/bath upstairs Alternate Level Stairs-Number of Steps: Flight Alternate Level Stairs-Rails: Right;Left Bathroom Shower/Tub: Teacher, early years/pre: Standard     Home Equipment: Walker - 2 wheels          Prior Functioning/Environment Level of Independence: Independent        Comments: Reports that he limits his activity due to weakness. Pt drives. Reported to OT that he does not use RW but to PT reported intermittent use of cane.         OT Problem List: Decreased strength;Decreased activity tolerance;Impaired balance (sitting and/or standing);Decreased safety awareness;Decreased knowledge of use of DME or AE;Decreased knowledge of precautions;Cardiopulmonary status limiting activity;Impaired UE functional use      OT Treatment/Interventions: Self-care/ADL training;Therapeutic exercise;Energy conservation;DME and/or AE instruction;Therapeutic  activities;Patient/family education;Balance training    OT Goals(Current goals can be found in the care plan section) Acute Rehab OT Goals Patient Stated Goal: Return home OT Goal Formulation: With patient Time For Goal Achievement: 10/17/17 Potential to Achieve Goals: Good ADL Goals Pt Will Perform Grooming: with modified independence;standing Pt Will Perform Lower Body Dressing: with modified independence;sit to/from stand Pt Will Transfer to Toilet: with modified independence;ambulating;regular height toilet Pt Will Perform Toileting - Clothing Manipulation and hygiene: with modified independence;sit to/from stand Pt/caregiver will Perform Home Exercise Program: Increased strength;Both right and left upper extremity;With written HEP provided;With Supervision  OT Frequency: Min 2X/week   Barriers to D/C:            Co-evaluation              AM-PAC PT "6 Clicks" Daily Activity     Outcome Measure Help from another person eating meals?: A Little Help from another person taking care of personal grooming?: A Little Help from another person toileting, which includes using toliet, bedpan, or urinal?: A Little Help from another person bathing (including washing, rinsing, drying)?: A Little Help from another person to put on and taking off regular upper  body clothing?: A Little Help from another person to put on and taking off regular lower body clothing?: A Little 6 Click Score: 18   End of Session Equipment Utilized During Treatment: Gait belt;Rolling walker Nurse Communication: Mobility status(vitals)  Activity Tolerance: Treatment limited secondary to medical complications (Comment)(BP drop) Patient left: in bed;with call bell/phone within reach;with nursing/sitter in room  OT Visit Diagnosis: Unsteadiness on feet (R26.81);Other abnormalities of gait and mobility (R26.89);Muscle weakness (generalized) (M62.81)                Time: 3888-7579 OT Time Calculation (min): 24  min Charges:  OT General Charges $OT Visit: 1 Visit OT Evaluation $OT Eval Moderate Complexity: 1 Mod OT Treatments $Self Care/Home Management : 8-22 mins G-Codes:     Norman Herrlich, MS OTR/L  Pager: [place] Samuel Flores 10/03/2017, 10:16 AM

## 2017-10-04 LAB — CBC
HEMATOCRIT: 33.1 % — AB (ref 39.0–52.0)
Hemoglobin: 11.5 g/dL — ABNORMAL LOW (ref 13.0–17.0)
MCH: 35.6 pg — ABNORMAL HIGH (ref 26.0–34.0)
MCHC: 34.7 g/dL (ref 30.0–36.0)
MCV: 102.5 fL — ABNORMAL HIGH (ref 78.0–100.0)
PLATELETS: 217 10*3/uL (ref 150–400)
RBC: 3.23 MIL/uL — ABNORMAL LOW (ref 4.22–5.81)
RDW: 13.4 % (ref 11.5–15.5)
WBC: 15.3 10*3/uL — AB (ref 4.0–10.5)

## 2017-10-04 LAB — BASIC METABOLIC PANEL
Anion gap: 6 (ref 5–15)
BUN: 22 mg/dL — ABNORMAL HIGH (ref 6–20)
CALCIUM: 8.1 mg/dL — AB (ref 8.9–10.3)
CO2: 28 mmol/L (ref 22–32)
CREATININE: 0.97 mg/dL (ref 0.61–1.24)
Chloride: 101 mmol/L (ref 101–111)
Glucose, Bld: 122 mg/dL — ABNORMAL HIGH (ref 65–99)
Potassium: 4.6 mmol/L (ref 3.5–5.1)
Sodium: 135 mmol/L (ref 135–145)

## 2017-10-04 MED ORDER — PREDNISONE 20 MG PO TABS
30.0000 mg | ORAL_TABLET | Freq: Every day | ORAL | Status: DC
  Administered 2017-10-05 – 2017-10-06 (×2): 30 mg via ORAL
  Filled 2017-10-04 (×2): qty 1

## 2017-10-04 MED ORDER — METOPROLOL TARTRATE 50 MG PO TABS
50.0000 mg | ORAL_TABLET | Freq: Three times a day (TID) | ORAL | Status: DC
  Administered 2017-10-04 – 2017-10-05 (×3): 50 mg via ORAL
  Filled 2017-10-04 (×3): qty 1

## 2017-10-04 NOTE — Progress Notes (Signed)
Switched metoprolol to 50 mg tid, instead of 25 mg q6hr. Eventually, switch to 100 mg bid Continue diltiazem 30 q6 hr. Eventually switch to 180 CD daily Stopped digoxin.

## 2017-10-04 NOTE — Progress Notes (Signed)
PROGRESS NOTE [address]   Samuel Flores   ZOX:[id number] DOB: 25-Sep-1941  DOA: 09/26/2017 PCP: Alycia Rossetti, MD   Brief Narrative:  Samuel Flores 76 y.o.malewith history ofdaily EtOH use, CAD, gout, and A.Fib on eliquis who presented to the ED with acute onset central chest and epigastric pain. On EMS arrival ASA and NTG given and SBP dropped to 90s. In the ED Trop was negative, EKG was w/o acute changes, and CTa demonstrated no dissection nor PE but did show mild inflamation around the CBD and gallbladder. RUQ [place] was unremarkable. Patient was found to have epigastric and RUQ tenderness. Lipase neg. AST 192, ALT 80, Bili 2.7  Patient was found to have Klebsiella bacteremia felt to be due to pneumonia. Patient Also developed afib and cardiology was consulted.   Subjective: Patient seen and examined, doing significantly better. HR better controlled with cardizem and metoprolol   Assessment & Plan: Severe sepsis due to Klebsiella bacteremia - LLL PNA  Suspected from pneumonia initial xray showed left basilar opacity - repeated CXR today confirm PNA  CT abd with possible cholecystitis, [place] no gallstones or wall thickening no sonographic Murphy sign. Initially treated with zosyn, narrowed to Rocephin 11/14 will switch to Ceftin for 2 more days.   Acute metabolic encephalopathy - resolved  Now at baseline -  ? 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 Treated with IV lasix 40 mg  No seem overloaded, continue to hold lasix   Elevated liver enzymes  Likely due to EtOH abuse and shock liver from sepsis  LFT's improved   ETOH abuse Alcohol cessation discussed   AKI - Resolved   DVT prophylaxis: Eliquis  Code Status: Full code  Family Communication: None at bedside  Disposition Plan: Home in the next 24-48 hrs   Consultants:   None   Procedures:   None  Antimicrobials: Anti-infectives (From admission, onward)   Start     Dose/Rate Route Frequency Ordered Stop   10/03/17 1700  cefUROXime (CEFTIN) tablet 500 mg     500 mg Oral 2 times daily with meals 10/03/17 1438 10/05/17 0659   09/26/17 2200  cefTRIAXone (ROCEPHIN) 2 g in dextrose 5 % 50 mL IVPB  Status:  Discontinued     2 g 100 mL/hr over 30 Minutes Intravenous Every 24 hours 09/26/17 2000 10/03/17 1438   09/26/17 1200  piperacillin-tazobactam (ZOSYN) IVPB 3.375 g  Status:  Discontinued     3.375 g 12.5 mL/hr over 240 Minutes Intravenous Every 8 hours 09/26/17 0449 09/26/17 1958   09/26/17 0400  piperacillin-tazobactam (ZOSYN) IVPB 3.375 g     3.375 g 100 mL/hr over 30 Minutes Intravenous  Once 09/26/17 0353 09/26/17 0554      Objective: Vitals:   10/04/17 0506 10/04/17 0815 10/04/17 0829 10/04/17 1216  BP: 118/69  115/66  Pulse: 85 77  76  Resp: 18     Temp: 99 F (37.2 C)     TempSrc: Oral     SpO2:   95%   Weight: 85.7 kg (189 lb)     Height:        Intake/Output Summary (Last 24 hours) at 10/04/2017 1447 Last data filed at 10/04/2017 9518 Gross per 24 hour  Intake 150 ml  Output 700 ml  Net -550 ml   Filed Weights   10/02/17 0453 10/03/17 0427 10/04/17 0506  Weight: 85.3 kg (188 lb 0.8 oz) 85.3 kg (188 lb 0.8 oz) 85.7 kg (189 lb)    Examination: No changes on exam from 10/03/17  General:  NAD Cardiovascular: RRR, S1/S2 +, no rubs, no gallops Respiratory: CTA bilaterally, no wheezing, no rhonchi Abdominal: Soft, NT, ND, bowel sounds + Extremities: no edema Skin: No rash noted, left arm discoloration    Data Reviewed: I have personally reviewed following labs and imaging studies  CBC: Recent Labs  Lab 09/30/17 0236 10/01/17 0149 10/02/17 0236 10/03/17 0300 10/04/17 0228  WBC 13.1* 9.7 13.2* 14.1* 15.3*  HGB 11.3* 12.0* 11.6* 11.9* 11.5*  HCT 32.9* 35.2* 33.0* 35.1* 33.1*  MCV 101.9* 101.1* 100.3* 101.2* 102.5*  PLT 84* 96* 131* 185 841   Basic Metabolic Panel: Recent Labs  Lab 09/28/17 0951 09/29/17 0215 09/30/17 0236 10/01/17 0149 10/02/17 0236 10/04/17 0228  NA  --  136 136 134* 135 135  K  --  3.4* 4.1 3.8 3.1* 4.6  CL  --  102 102 100* 96* 101  CO2  --  24 26 26 30 28   GLUCOSE  --  219* 139* 173* 154* 122*  BUN  --  23* 20 21* 22* 22*  CREATININE  --  0.93 0.72 0.73 0.83 0.97  CALCIUM  --  7.3* 7.2* 7.4* 7.7* 8.1*  MG 1.5* 1.7  --   --   --   --    GFR: Estimated Creatinine Clearance: 68.1 mL/min (by C-G formula based on SCr of 0.97 mg/dL). Liver Function Tests: Recent Labs  Lab 09/28/17 0155 09/29/17 0215  AST 128* 85*  ALT 92* 79*  ALKPHOS 122 129*  BILITOT 7.2* 4.0*  PROT 4.7* 5.4*  ALBUMIN 2.3* 2.5*   No results for input(s): LIPASE, AMYLASE in the last 168 hours. No results for input(s): AMMONIA in the last 168 hours. Coagulation Profile: No results for input(s): INR, PROTIME in the last 168 hours. Cardiac Enzymes: No results for input(s): CKTOTAL, CKMB, CKMBINDEX, TROPONINI in the last 168 hours. BNP (last 3 results) No results for input(s): PROBNP in the last 8760 hours. HbA1C: No results for input(s): HGBA1C in the last 72 hours. CBG: Recent Labs  Lab 10/02/17 0748  GLUCAP 102*   Lipid Profile: No results for input(s): CHOL, HDL, LDLCALC, TRIG, CHOLHDL, LDLDIRECT in the last 72 hours. Thyroid Function Tests: No  results for input(s): TSH, T4TOTAL, FREET4, T3FREE, THYROIDAB in the last 72 hours. Anemia Panel: No results for input(s): VITAMINB12, FOLATE, FERRITIN, TIBC, IRON, RETICCTPCT in the last 72 hours. Sepsis Labs: No results for input(s): PROCALCITON, LATICACIDVEN in the last 168 hours.  Recent Results (from the past 240 hour(s))  Blood culture (routine x 2)     Status: Abnormal   Collection Time: 09/26/17  5:00 AM  Result Value Ref Range Status   Specimen Description BLOOD RIGHT ANTECUBITAL  Final   Special Requests   Final    BOTTLES DRAWN  AEROBIC AND ANAEROBIC Blood Culture results may not be optimal due to an excessive volume of blood received in culture bottles   Culture  Setup Time   Final    GRAM NEGATIVE RODS IN BOTH AEROBIC AND ANAEROBIC BOTTLES CRITICAL RESULT CALLED TO, READ BACK BY AND VERIFIED WITH: Mardella Layman PHARMD 1941 09/26/17 A BROWNING    Culture (A)  Final    KLEBSIELLA PNEUMONIAE SUSCEPTIBILITIES PERFORMED ON PREVIOUS CULTURE WITHIN THE LAST 5 DAYS.    Report Status 09/28/2017 FINAL  Final  Blood culture (routine x 2)     Status: Abnormal   Collection Time: 09/26/17  5:10 AM  Result Value Ref Range Status   Specimen Description BLOOD RIGHT HAND  Final   Special Requests   Final    BOTTLES DRAWN AEROBIC AND ANAEROBIC Blood Culture results may not be optimal due to an excessive volume of blood received in culture bottles   Culture  Setup Time   Final    GRAM NEGATIVE RODS IN BOTH AEROBIC AND ANAEROBIC BOTTLES CRITICAL RESULT CALLED TO, READ BACK BY AND VERIFIED WITH: Mardella Layman PHARMD 1941 09/26/17 A BROWNING    Culture KLEBSIELLA PNEUMONIAE (A)  Final   Report Status 09/28/2017 FINAL  Final   Organism ID, Bacteria KLEBSIELLA PNEUMONIAE  Final      Susceptibility   Klebsiella pneumoniae - MIC*    AMPICILLIN RESISTANT Resistant     CEFAZOLIN <=4 SENSITIVE Sensitive     CEFEPIME <=1 SENSITIVE Sensitive     CEFTAZIDIME <=1 SENSITIVE Sensitive     CEFTRIAXONE <=1  SENSITIVE Sensitive     CIPROFLOXACIN <=0.25 SENSITIVE Sensitive     GENTAMICIN <=1 SENSITIVE Sensitive     IMIPENEM <=0.25 SENSITIVE Sensitive     TRIMETH/SULFA <=20 SENSITIVE Sensitive     AMPICILLIN/SULBACTAM <=2 SENSITIVE Sensitive     PIP/TAZO <=4 SENSITIVE Sensitive     Extended ESBL NEGATIVE Sensitive     * KLEBSIELLA PNEUMONIAE  Blood Culture ID Panel (Reflexed)     Status: Abnormal   Collection Time: 09/26/17  5:10 AM  Result Value Ref Range Status   Enterococcus species NOT DETECTED NOT DETECTED Final   Listeria monocytogenes NOT DETECTED NOT DETECTED Final   Staphylococcus species NOT DETECTED NOT DETECTED Final   Staphylococcus aureus NOT DETECTED NOT DETECTED Final   Streptococcus species NOT DETECTED NOT DETECTED Final   Streptococcus agalactiae NOT DETECTED NOT DETECTED Final   Streptococcus pneumoniae NOT DETECTED NOT DETECTED Final   Streptococcus pyogenes NOT DETECTED NOT DETECTED Final   Acinetobacter baumannii NOT DETECTED NOT DETECTED Final   Enterobacteriaceae species DETECTED (A) NOT DETECTED Final    Comment: Enterobacteriaceae represent a large family of gram-negative bacteria, not a single organism. CRITICAL RESULT CALLED TO, READ BACK BY AND VERIFIED WITH: Mardella Layman PHARMD 1610 09/26/17 A BROWNING    Enterobacter cloacae complex NOT DETECTED NOT DETECTED Final   Escherichia coli NOT DETECTED NOT DETECTED Final   Klebsiella oxytoca NOT DETECTED NOT DETECTED Final   Klebsiella pneumoniae DETECTED (A) NOT DETECTED Final    Comment: CRITICAL RESULT CALLED TO, READ BACK BY AND VERIFIED WITH: Mardella Layman PHARMD 1941 09/26/17 A BROWNING    Proteus species NOT DETECTED NOT DETECTED Final   Serratia marcescens NOT DETECTED NOT DETECTED Final   Carbapenem resistance NOT DETECTED NOT DETECTED Final   Haemophilus influenzae NOT DETECTED NOT DETECTED Final   Neisseria meningitidis NOT DETECTED NOT DETECTED Final   Pseudomonas aeruginosa NOT DETECTED NOT DETECTED Final  Candida albicans NOT DETECTED NOT DETECTED Final   Candida glabrata NOT DETECTED NOT DETECTED Final   Candida krusei NOT DETECTED NOT DETECTED Final   Candida parapsilosis NOT DETECTED NOT DETECTED Final   Candida tropicalis NOT DETECTED NOT DETECTED Final  Culture, Urine     Status: None   Collection Time: 09/26/17  4:44 PM  Result Value Ref Range Status   Specimen Description URINE, CLEAN CATCH  Final   Special Requests NONE  Final   Culture NO GROWTH  Final   Report Status 09/27/2017 FINAL  Final  MRSA PCR Screening     Status: None   Collection Time: 09/26/17  8:42 PM  Result Value Ref Range Status   MRSA by PCR NEGATIVE NEGATIVE Final    Comment:        The GeneXpert MRSA Assay (FDA approved for NASAL specimens only), is one component of a comprehensive MRSA colonization surveillance program. It is not intended to diagnose MRSA infection nor to guide or monitor treatment for MRSA infections.   Culture, blood (Routine X 2) w Reflex to ID Panel     Status: None   Collection Time: 09/28/17  7:13 PM  Result Value Ref Range Status   Specimen Description BLOOD RIGHT ARM  Final   Special Requests   Final    BOTTLES DRAWN AEROBIC ONLY Blood Culture adequate volume   Culture NO GROWTH 5 DAYS  Final   Report Status 10/03/2017 FINAL  Final  Culture, blood (Routine X 2) w Reflex to ID Panel     Status: None   Collection Time: 09/28/17  7:16 PM  Result Value Ref Range Status   Specimen Description BLOOD RIGHT HAND  Final   Special Requests   Final    BOTTLES DRAWN AEROBIC ONLY Blood Culture adequate volume   Culture NO GROWTH 5 DAYS  Final   Report Status 10/03/2017 FINAL  Final      Radiology Studies: No results found.    Scheduled Meds: . allopurinol  200 mg Oral Daily  . apixaban  5 mg Oral BID  . cefUROXime  500 mg Oral BID WC  . chlorhexidine  15 mL Mouth Rinse BID  . diltiazem  30 mg Oral Q6H  . feeding supplement (ENSURE ENLIVE)  237 mL Oral BID BM  . folic  acid  1 mg Oral Daily  . ipratropium  0.5 mg Nebulization BID   And  . levalbuterol  0.63 mg Nebulization BID  . mouth rinse  15 mL Mouth Rinse q12n4p  . metoprolol tartrate  50 mg Oral Q8H  . multivitamin with minerals  1 tablet Oral Daily  . pantoprazole  40 mg Oral Q1200  . pneumococcal 23 valent vaccine  0.5 mL Intramuscular Tomorrow-1000  . potassium chloride  40 mEq Oral BID  . [START ON 10/05/2017] predniSONE  30 mg Oral Q breakfast  . senna-docusate  1 tablet Oral BID  . sodium chloride flush  3 mL Intravenous Q12H  . thiamine  100 mg Oral Daily   Continuous Infusions: . sodium chloride       LOS: 8 days    Time spent: Total of 15 minutes spent with pt, greater than 50% of which was spent in discussion of  treatment, counseling and coordination of care   Chipper Oman, MD Pager: Text Page via [email or web address]   If 7PM-7AM, please contact night-coverage [email or web address] 10/04/2017, 2:47 PM

## 2017-10-04 NOTE — Progress Notes (Signed)
Subjective:  Feels better No chest pain. Shortness of breath Wants to get out of bed and ambulate  Pressure ulcer per nursing notes.  Objective:  Vital Signs in the last 24 hours: Temp:  [97.6 F (36.4 C)-99 F (37.2 C)] 99 F (37.2 C) (11/22 0506) Pulse Rate:  [47-123] 77 (11/22 0815) Resp:  [18-25] 18 (11/22 0506) BP: (102-133)/(64-79) 118/69 (11/22 0506) SpO2:  [92 %-98 %] 95 % (11/22 0829) Weight:  [85.7 kg (189 lb)] 85.7 kg (189 lb) (11/22 0506)  Intake/Output from previous day: 11/21 0701 - 11/22 0700 In: 240 [P.O.:240] Out: 850 [Urine:850] Intake/Output from this shift: Total I/O In: 150 [P.O.:150] Out: -   Physical Exam:  Constitutional: He isoriented to person, place, and time. He appearswell-developedand well-nourished.  HENT:  Head:Atraumatic.  Eyes:Conjunctivaeare normal.  Neck:Neck supple.  Cardiovascular: Exam revealsno friction rub. No murmurheard. S1 variable, S2 normal Respiratory:Effort normaland faint scattered crackles present.  LO:VFIE.Bowel sounds are normal.  Musculoskeletal:Normal range of motion.  Neurological: He isalertand oriented to person, place, and time.  Skin: Skin iswarm.Left arm ecchymosis noted. Pressure ulcer with dressing on sacral spine Psychiatric: Appears anxious.     Lab Results: Recent Labs    10/03/17 0300 10/04/17 0228  WBC 14.1* 15.3*  HGB 11.9* 11.5*  PLT 185 217   Recent Labs    10/02/17 0236 10/04/17 0228  NA 135 135  K 3.1* 4.6  CL 96* 101  CO2 30 28  GLUCOSE 154* 122*  BUN 22* 22*  CREATININE 0.83 0.97    Imaging: CXR 09/30/2017 CLINICAL DATA:  Shortness of Breath  EXAM: PORTABLE CHEST 1 VIEW  COMPARISON:  09/28/2017  FINDINGS: Cardiac shadow is mildly enlarged but stable. Lungs are hypoinflated without focal infiltrate or sizable effusion. Postsurgical changes in the cervical spine are noted. Previously seen changes in the left base have cleared in the  interval.  IMPRESSION: Significant improved aeration without focal infiltrate at this time.   Electronically Signed   By: Inez Catalina M.D.   On: 09/30/2017 15:52  Cardiac Studies: Echocardiogram 10/01/2017: Study Conclusions  - Left ventricle: The cavity size was normal. There was mild   concentric hypertrophy. Systolic function was normal. The   estimated ejection fraction was in the range of 50% to 55%.   Regional wall motion abnormalities cannot be excluded. The study   is not technically sufficient to allow evaluation of LV diastolic   function. - Left atrium: The atrium was moderately dilated. - Right ventricle: The cavity size was mildly dilated. Wall   thickness was normal. - Atrial septum: No defect or patent foramen ovale was identified. - Pulmonary arteries: Systolic pressure was mildly increased. PA   peak pressure: 32 mm Hg (S).    Assessment:  Afib w/RVR: Secondary to sepsis. Now improving CHA2DS2-VASCScore: Risk Score3, Yearly risk of stroke 3.2. H/o alcoholic cardiomyopathy. EF low normal H/O Liver cirrohosis. Abnormal liver enzymes Klebsiella pneumonia and LLL pneumonia Thrombocytopenia: Resolving  Recommendations:  Switched metoprolol to 50 mg tid, instead of 25 mg q6hr. Eventually, switch to 100 mg bid Continue diltiazem 30 q6 hr. Eventually switch to 180 CD daily Stopped digoxin. Continue apixaban 5 mg bid Rest of the management per the primary team      LOS: 8 days    Samuel Flores 10/04/2017, 11:58 AM  Nigel Mormon, MD [facility] Pager: [phone] Office: [phone] If no answer Cell [phone]

## 2017-10-05 LAB — URINALYSIS, ROUTINE W REFLEX MICROSCOPIC
Bilirubin Urine: NEGATIVE
GLUCOSE, UA: NEGATIVE mg/dL
HGB URINE DIPSTICK: NEGATIVE
Ketones, ur: NEGATIVE mg/dL
LEUKOCYTES UA: NEGATIVE
Nitrite: NEGATIVE
PH: 5 (ref 5.0–8.0)
Protein, ur: NEGATIVE mg/dL
SPECIFIC GRAVITY, URINE: 1.017 (ref 1.005–1.030)

## 2017-10-05 LAB — DIFFERENTIAL
BASOS ABS: 0 10*3/uL (ref 0.0–0.1)
Basophils Relative: 0 %
Eosinophils Absolute: 0.1 10*3/uL (ref 0.0–0.7)
Eosinophils Relative: 1 %
LYMPHS ABS: 1.7 10*3/uL (ref 0.7–4.0)
Lymphocytes Relative: 9 %
MONO ABS: 1.4 10*3/uL — AB (ref 0.1–1.0)
MONOS PCT: 7 %
NEUTROS ABS: 15.7 10*3/uL — AB (ref 1.7–7.7)
Neutrophils Relative %: 83 %

## 2017-10-05 LAB — CBC
HEMATOCRIT: 36.1 % — AB (ref 39.0–52.0)
HEMOGLOBIN: 12.1 g/dL — AB (ref 13.0–17.0)
MCH: 34.5 pg — ABNORMAL HIGH (ref 26.0–34.0)
MCHC: 33.5 g/dL (ref 30.0–36.0)
MCV: 102.8 fL — AB (ref 78.0–100.0)
Platelets: 253 10*3/uL (ref 150–400)
RBC: 3.51 MIL/uL — ABNORMAL LOW (ref 4.22–5.81)
RDW: 13.3 % (ref 11.5–15.5)
WBC: 19.3 10*3/uL — AB (ref 4.0–10.5)

## 2017-10-05 MED ORDER — METOPROLOL TARTRATE 50 MG PO TABS
50.0000 mg | ORAL_TABLET | Freq: Two times a day (BID) | ORAL | Status: DC
  Administered 2017-10-05 – 2017-10-06 (×3): 50 mg via ORAL
  Filled 2017-10-05 (×3): qty 1

## 2017-10-05 MED ORDER — CEFUROXIME AXETIL 500 MG PO TABS
500.0000 mg | ORAL_TABLET | Freq: Two times a day (BID) | ORAL | Status: DC
  Administered 2017-10-05 – 2017-10-06 (×2): 500 mg via ORAL
  Filled 2017-10-05 (×3): qty 1

## 2017-10-05 MED ORDER — FLUTICASONE PROPIONATE 50 MCG/ACT NA SUSP
2.0000 | Freq: Every day | NASAL | Status: DC
  Administered 2017-10-05 – 2017-10-06 (×2): 2 via NASAL
  Filled 2017-10-05: qty 16

## 2017-10-05 MED ORDER — DILTIAZEM HCL ER COATED BEADS 120 MG PO CP24: 120.0000 mg | ORAL_CAPSULE | Freq: Every day | ORAL | Status: DC

## 2017-10-05 MED ORDER — DILTIAZEM HCL ER COATED BEADS 180 MG PO CP24
180.0000 mg | ORAL_CAPSULE | Freq: Every day | ORAL | Status: DC
  Administered 2017-10-05 – 2017-10-06 (×2): 180 mg via ORAL
  Filled 2017-10-05 (×2): qty 1

## 2017-10-05 MED ORDER — METOPROLOL TARTRATE 100 MG PO TABS: 100.0000 mg | ORAL_TABLET | Freq: Two times a day (BID) | ORAL | Status: DC

## 2017-10-05 NOTE — Progress Notes (Signed)
PROGRESS NOTE [address]   Samuel Flores   ZSW:[id number] DOB: 1941/09/19  DOA: 09/26/2017 PCP: Alycia Rossetti, MD   Brief Narrative:  Samuel Flores 76 y.o.malewith history ofdaily EtOH use, CAD, gout, and A.Fib on eliquis who presented to the ED with acute onset central chest and epigastric pain. On EMS arrival ASA and NTG given and SBP dropped to 90s. In the ED Trop was negative, EKG was w/o acute changes, and CTa demonstrated no dissection nor PE but did show mild inflamation around the CBD and gallbladder. RUQ [place] was unremarkable. Patient was found to have epigastric and RUQ tenderness. Lipase neg. AST 192, ALT 80, Bili 2.7  Patient was found to have Klebsiella bacteremia felt to be due to pneumonia. Patient Also developed afib and cardiology was consulted.   Subjective: Patient doing well, c/o nasal congestion, wants to go home.   Assessment & Plan: Severe sepsis due to Klebsiella bacteremia - LLL PNA  Suspected from pneumonia initial xray showed left basilar opacity - repeated CXR today confirm PNA  CT abd with possible cholecystitis, [place] no gallstones or wall thickening no sonographic Murphy sign. Initially treated with zosyn, narrowed to Rocephin 11/14 will switch to Ceftin for 3 more days.  Wean O2 as tolerated  Will add Flonase for nasal congestion   Leukocytosis  Likely from prednisone, although WBC increasing  Will check UA, patient remains afebrile  No other sources of infections identified  Check CBC in AM   Acute metabolic encephalopathy - resolved  Now at baseline -  ? 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 Treated with IV lasix 40 mg  No seem overloaded, continue to hold lasix   Elevated liver enzymes  Likely due to EtOH abuse and shock liver from sepsis  LFT's improved   ETOH abuse Alcohol cessation discussed   AKI - Resolved   DVT prophylaxis: Eliquis  Code Status: Full code  Family Communication: None at bedside  Disposition Plan: Home in AM if WBC stable   Consultants:   None   Procedures:   None  Antimicrobials: Anti-infectives (From admission, onward)   Start     Dose/Rate Route Frequency Ordered Stop   10/05/17 1700  cefUROXime (CEFTIN) tablet 500 mg     500 mg Oral 2 times daily with meals 10/05/17 1625 10/08/17 1659   10/03/17 1700  cefUROXime (CEFTIN) tablet 500 mg     500 mg Oral 2 times daily with meals 10/03/17 1438 10/04/17 1609   09/26/17 2200  cefTRIAXone (ROCEPHIN) 2 g in dextrose 5 % 50 mL IVPB  Status:  Discontinued     2 g 100 mL/hr over 30 Minutes Intravenous Every 24 hours 09/26/17 2000 10/03/17 1438   09/26/17 1200  piperacillin-tazobactam (ZOSYN) IVPB 3.375 g  Status:  Discontinued     3.375 g 12.5 mL/hr  over 240 Minutes Intravenous Every 8 hours 09/26/17 0449 09/26/17 1958   09/26/17 0400  piperacillin-tazobactam (ZOSYN) IVPB 3.375 g     3.375 g 100 mL/hr over 30 Minutes Intravenous  Once 09/26/17 0353 09/26/17 0554      Objective: Vitals:   10/05/17 0543 10/05/17 0806 10/05/17 1241 10/05/17 1404  BP: 135/65  (!) 98/57 122/85  Pulse: 74  (!) 58 75  Resp: 18   18  Temp: 97.6 F (36.4 C)   97.7 F (36.5 C)  TempSrc: Oral   Oral  SpO2: 96% 96%  97%  Weight:      Height:        Intake/Output Summary (Last 24 hours) at 10/05/2017  1630 Last data filed at 10/05/2017 1341 Gross per 24 hour  Intake 3 ml  Output 1475 ml  Net -1472 ml   Filed Weights   10/03/17 0427 10/04/17 0506 10/05/17 0500  Weight: 85.3 kg (188 lb 0.8 oz) 85.7 kg (189 lb) 83.6 kg (184 lb 4.9 oz)    Examination:   General: Pt is alert, awake, not in acute distress Cardiovascular: RRR, S1/S2 +, no rubs, no gallops Respiratory: CTA bilaterally, no wheezing, no rhonchi Genitourinary: Condom cath in place, urine dark yellow, no blood noted   Data Reviewed: I have personally reviewed following labs and imaging studies  CBC: Recent Labs  Lab 10/01/17 0149 10/02/17 0236 10/03/17 0300 10/04/17 0228 10/05/17 0537  WBC 9.7 13.2* 14.1* 15.3* 19.3*  NEUTROABS  --   --   --   --  15.7*  HGB 12.0* 11.6* 11.9* 11.5* 12.1*  HCT 35.2* 33.0* 35.1* 33.1* 36.1*  MCV 101.1* 100.3* 101.2* 102.5* 102.8*  PLT 96* 131* 185 217 409   Basic Metabolic Panel: Recent Labs  Lab 09/29/17 0215 09/30/17 0236 10/01/17 0149 10/02/17 0236 10/04/17 0228  NA 136 136 134* 135 135  K 3.4* 4.1 3.8 3.1* 4.6  CL 102 102 100* 96* 101  CO2 24 26 26 30 28   GLUCOSE 219* 139* 173* 154* 122*  BUN 23* 20 21* 22* 22*  CREATININE 0.93 0.72 0.73 0.83 0.97  CALCIUM 7.3* 7.2* 7.4* 7.7* 8.1*  MG 1.7  --   --   --   --    GFR: Estimated Creatinine Clearance: 62.8 mL/min (by C-G formula based on SCr of 0.97 mg/dL). Liver Function Tests: Recent Labs  Lab 09/29/17 0215  AST 85*  ALT 79*  ALKPHOS 129*  BILITOT 4.0*  PROT 5.4*  ALBUMIN 2.5*   No results for input(s): LIPASE, AMYLASE in the last 168 hours. No results for input(s): AMMONIA in the last 168 hours. Coagulation Profile: No results for input(s): INR, PROTIME in the last 168 hours. Cardiac Enzymes: No results for input(s): CKTOTAL, CKMB, CKMBINDEX, TROPONINI in the last 168 hours. BNP (last 3 results) No results for input(s): PROBNP in the last 8760 hours. HbA1C: No results for input(s): HGBA1C in the last  72 hours. CBG: Recent Labs  Lab 10/02/17 0748  GLUCAP 102*   Lipid Profile: No results for input(s): CHOL, HDL, LDLCALC, TRIG, CHOLHDL, LDLDIRECT in the last 72 hours. Thyroid Function Tests: No results for input(s): TSH, T4TOTAL, FREET4, T3FREE, THYROIDAB in the last 72 hours. Anemia Panel: No results for input(s): VITAMINB12, FOLATE, FERRITIN, TIBC, IRON, RETICCTPCT in the last 72 hours. Sepsis Labs: No results for input(s): PROCALCITON, LATICACIDVEN in the last 168 hours.  Recent Results (from the past 240 hour(s))  Blood culture (routine x 2)  Status: Abnormal   Collection Time: 09/26/17  5:00 AM  Result Value Ref Range Status   Specimen Description BLOOD RIGHT ANTECUBITAL  Final   Special Requests   Final    BOTTLES DRAWN AEROBIC AND ANAEROBIC Blood Culture results may not be optimal due to an excessive volume of blood received in culture bottles   Culture  Setup Time   Final    GRAM NEGATIVE RODS IN BOTH AEROBIC AND ANAEROBIC BOTTLES CRITICAL RESULT CALLED TO, READ BACK BY AND VERIFIED WITHMardella Layman PHARMD 1941 09/26/17 A BROWNING    Culture (A)  Final    KLEBSIELLA PNEUMONIAE SUSCEPTIBILITIES PERFORMED ON PREVIOUS CULTURE WITHIN THE LAST 5 DAYS.    Report Status 09/28/2017 FINAL  Final  Blood culture (routine x 2)     Status: Abnormal   Collection Time: 09/26/17  5:10 AM  Result Value Ref Range Status   Specimen Description BLOOD RIGHT HAND  Final   Special Requests   Final    BOTTLES DRAWN AEROBIC AND ANAEROBIC Blood Culture results may not be optimal due to an excessive volume of blood received in culture bottles   Culture  Setup Time   Final    GRAM NEGATIVE RODS IN BOTH AEROBIC AND ANAEROBIC BOTTLES CRITICAL RESULT CALLED TO, READ BACK BY AND VERIFIED WITH: Mardella Layman PHARMD 1941 09/26/17 A BROWNING    Culture KLEBSIELLA PNEUMONIAE (A)  Final   Report Status 09/28/2017 FINAL  Final   Organism ID, Bacteria KLEBSIELLA PNEUMONIAE  Final      Susceptibility    Klebsiella pneumoniae - MIC*    AMPICILLIN RESISTANT Resistant     CEFAZOLIN <=4 SENSITIVE Sensitive     CEFEPIME <=1 SENSITIVE Sensitive     CEFTAZIDIME <=1 SENSITIVE Sensitive     CEFTRIAXONE <=1 SENSITIVE Sensitive     CIPROFLOXACIN <=0.25 SENSITIVE Sensitive     GENTAMICIN <=1 SENSITIVE Sensitive     IMIPENEM <=0.25 SENSITIVE Sensitive     TRIMETH/SULFA <=20 SENSITIVE Sensitive     AMPICILLIN/SULBACTAM <=2 SENSITIVE Sensitive     PIP/TAZO <=4 SENSITIVE Sensitive     Extended ESBL NEGATIVE Sensitive     * KLEBSIELLA PNEUMONIAE  Blood Culture ID Panel (Reflexed)     Status: Abnormal   Collection Time: 09/26/17  5:10 AM  Result Value Ref Range Status   Enterococcus species NOT DETECTED NOT DETECTED Final   Listeria monocytogenes NOT DETECTED NOT DETECTED Final   Staphylococcus species NOT DETECTED NOT DETECTED Final   Staphylococcus aureus NOT DETECTED NOT DETECTED Final   Streptococcus species NOT DETECTED NOT DETECTED Final   Streptococcus agalactiae NOT DETECTED NOT DETECTED Final   Streptococcus pneumoniae NOT DETECTED NOT DETECTED Final   Streptococcus pyogenes NOT DETECTED NOT DETECTED Final   Acinetobacter baumannii NOT DETECTED NOT DETECTED Final   Enterobacteriaceae species DETECTED (A) NOT DETECTED Final    Comment: Enterobacteriaceae represent a large family of gram-negative bacteria, not a single organism. CRITICAL RESULT CALLED TO, READ BACK BY AND VERIFIED WITH: Mardella Layman PHARMD [id number] 09/26/17 A BROWNING    Enterobacter cloacae complex NOT DETECTED NOT DETECTED Final   Escherichia coli NOT DETECTED NOT DETECTED Final   Klebsiella oxytoca NOT DETECTED NOT DETECTED Final   Klebsiella pneumoniae DETECTED (A) NOT DETECTED Final    Comment: CRITICAL RESULT CALLED TO, READ BACK BY AND VERIFIED WITHMardella Layman PHARMD 1941 09/26/17 A BROWNING    Proteus species NOT DETECTED NOT DETECTED Final   Serratia marcescens NOT DETECTED NOT DETECTED Final  Carbapenem resistance NOT  DETECTED NOT DETECTED Final   Haemophilus influenzae NOT DETECTED NOT DETECTED Final   Neisseria meningitidis NOT DETECTED NOT DETECTED Final   Pseudomonas aeruginosa NOT DETECTED NOT DETECTED Final   Candida albicans NOT DETECTED NOT DETECTED Final   Candida glabrata NOT DETECTED NOT DETECTED Final   Candida krusei NOT DETECTED NOT DETECTED Final   Candida parapsilosis NOT DETECTED NOT DETECTED Final   Candida tropicalis NOT DETECTED NOT DETECTED Final  Culture, Urine     Status: None   Collection Time: 09/26/17  4:44 PM  Result Value Ref Range Status   Specimen Description URINE, CLEAN CATCH  Final   Special Requests NONE  Final   Culture NO GROWTH  Final   Report Status 09/27/2017 FINAL  Final  MRSA PCR Screening     Status: None   Collection Time: 09/26/17  8:42 PM  Result Value Ref Range Status   MRSA by PCR NEGATIVE NEGATIVE Final    Comment:        The GeneXpert MRSA Assay (FDA approved for NASAL specimens only), is one component of a comprehensive MRSA colonization surveillance program. It is not intended to diagnose MRSA infection nor to guide or monitor treatment for MRSA infections.   Culture, blood (Routine X 2) w Reflex to ID Panel     Status: None   Collection Time: 09/28/17  7:13 PM  Result Value Ref Range Status   Specimen Description BLOOD RIGHT ARM  Final   Special Requests   Final    BOTTLES DRAWN AEROBIC ONLY Blood Culture adequate volume   Culture NO GROWTH 5 DAYS  Final   Report Status 10/03/2017 FINAL  Final  Culture, blood (Routine X 2) w Reflex to ID Panel     Status: None   Collection Time: 09/28/17  7:16 PM  Result Value Ref Range Status   Specimen Description BLOOD RIGHT HAND  Final   Special Requests   Final    BOTTLES DRAWN AEROBIC ONLY Blood Culture adequate volume   Culture NO GROWTH 5 DAYS  Final   Report Status 10/03/2017 FINAL  Final      Radiology Studies: No results found.    Scheduled Meds: . allopurinol  200 mg Oral Daily    . apixaban  5 mg Oral BID  . cefUROXime  500 mg Oral BID WC  . chlorhexidine  15 mL Mouth Rinse BID  . diltiazem  180 mg Oral Daily  . feeding supplement (ENSURE ENLIVE)  237 mL Oral BID BM  . fluticasone  2 spray Each Nare Daily  . folic acid  1 mg Oral Daily  . ipratropium  0.5 mg Nebulization BID   And  . levalbuterol  0.63 mg Nebulization BID  . mouth rinse  15 mL Mouth Rinse q12n4p  . metoprolol tartrate  50 mg Oral BID  . multivitamin with minerals  1 tablet Oral Daily  . pantoprazole  40 mg Oral Q1200  . pneumococcal 23 valent vaccine  0.5 mL Intramuscular Tomorrow-1000  . potassium chloride  40 mEq Oral BID  . predniSONE  30 mg Oral Q breakfast  . senna-docusate  1 tablet Oral BID  . sodium chloride flush  3 mL Intravenous Q12H  . thiamine  100 mg Oral Daily   Continuous Infusions: . sodium chloride       LOS: 9 days   Time spent: Total of 15 minutes spent with pt, greater than 50% of which was spent in discussion  of  treatment, counseling and coordination of care  Chipper Oman, MD Pager: Text Page via [email or web address]   If 7PM-7AM, please contact night-coverage [email or web address] 10/05/2017, 4:30 PM

## 2017-10-05 NOTE — Care Management Note (Signed)
Case Management Note  Patient Details  Name: OBADIAH DENNARD MRN: [id number] Date of Birth: [birth year]  Subjective/Objective:                 Spoke with patient at the bedside and wife over the phone. Patient states he has a walker and 3/1 at home, has experience with [facility] and would like to use again. Currently on oxygent, non dependent.    Action/Plan:  Referral placed to [facility], clinical liaison Tim H.  Need to watch for Home Oxygen.    Expected Discharge Date:                  Expected Discharge Plan:  [place]  In-House Referral:     Discharge planning Services  CM Consult  Post Acute Care Choice:  Home Health Choice offered to:  Patient, Spouse  DME Arranged:    DME Agency:     HH Arranged:    HH Agency:     Status of Service:  In process, will continue to follow  If discussed at Long Length of Stay Meetings, dates discussed:    Additional Comments:  Carles Collet, RN 10/05/2017, 9:45 AM

## 2017-10-05 NOTE — Progress Notes (Addendum)
Subjective:  Feels better No chest pain. Shortness of breath Wants to get out of bed and ambulate Wide complex beats seen on monitor, likely aberrant conduction  Pressure ulcer per nursing notes.  Objective:  Vital Signs in the last 24 hours: Temp:  [97.5 F (36.4 C)-97.8 F (36.6 C)] 97.6 F (36.4 C) (11/23 0543) Pulse Rate:  [60-76] 74 (11/23 0543) Resp:  [14-22] 18 (11/23 0543) BP: (109-135)/(60-74) 135/65 (11/23 0543) SpO2:  [95 %-98 %] 96 % (11/23 0806) Weight:  [83.6 kg (184 lb 4.9 oz)] 83.6 kg (184 lb 4.9 oz) (11/23 0500)  Intake/Output from previous day: 11/22 0701 - 11/23 0700 In: 153 [P.O.:150; I.V.:3] Out: 1475 [Urine:1475] Intake/Output from this shift: No intake/output data recorded.  Physical Exam:  Constitutional: He isoriented to person, place, and time. He appearswell-developedand well-nourished.  HENT:  Head:Atraumatic.  Eyes:Conjunctivaeare normal.  Neck:Neck supple.  Cardiovascular: Exam revealsno friction rub. No murmurheard. S1 variable, S2 normal Respiratory:Effort normaland faint scattered crackles present.  UY:QIHK.Bowel sounds are normal.  Musculoskeletal:Normal range of motion.  Neurological: He isalertand oriented to person, place, and time.  Skin: Skin iswarm.Left arm ecchymosis noted. Pressure ulcer with dressing on sacral spine Psychiatric: Appears anxious.     Lab Results: Recent Labs    10/04/17 0228 10/05/17 0537  WBC 15.3* 19.3*  HGB 11.5* 12.1*  PLT 217 253   Recent Labs    10/04/17 0228  NA 135  K 4.6  CL 101  CO2 28  GLUCOSE 122*  BUN 22*  CREATININE 0.97    Imaging: CXR 09/30/2017 CLINICAL DATA:  Shortness of Breath  EXAM: PORTABLE CHEST 1 VIEW  COMPARISON:  09/28/2017  FINDINGS: Cardiac shadow is mildly enlarged but stable. Lungs are hypoinflated without focal infiltrate or sizable effusion. Postsurgical changes in the cervical spine are noted. Previously seen changes in the  left base have cleared in the interval.  IMPRESSION: Significant improved aeration without focal infiltrate at this time.   Electronically Signed   By: Inez Catalina M.D.   On: 09/30/2017 15:52  Cardiac Studies: Echocardiogram 10/01/2017: Study Conclusions  - Left ventricle: The cavity size was normal. There was mild   concentric hypertrophy. Systolic function was normal. The   estimated ejection fraction was in the range of 50% to 55%.   Regional wall motion abnormalities cannot be excluded. The study   is not technically sufficient to allow evaluation of LV diastolic   function. - Left atrium: The atrium was moderately dilated. - Right ventricle: The cavity size was mildly dilated. Wall   thickness was normal. - Atrial septum: No defect or patent foramen ovale was identified. - Pulmonary arteries: Systolic pressure was mildly increased. PA   peak pressure: 32 mm Hg (S).    Assessment:  Afib w/RVR: Secondary to sepsis. Now improving CHA2DS2-VASCScore: Risk Score3, Yearly risk of stroke 3.2. H/o alcoholic cardiomyopathy. EF low normal H/O Liver cirrohosis. Abnormal liver enzymes Klebsiella pneumonia and LLL pneumonia Thrombocytopenia: Resolving  Recommendations: Switched diltiazem 30 q6 hr to diltiazem CD 180 mg to closely match his home regimen. Switched metoprolol 50 mg tid to 50 mg bid. If he remains inpatient for 1-2 more days, could uptitrate back to 100 mg bid home dose. Continue apixaban 5 mg bid Rest of the management per the primary team      LOS: 9 days    Coletta Lockner J Sherell Christoffel 10/05/2017, 10:46 AM  Nigel Mormon, MD [facility] Pager: [phone] Office: [phone] If no answer Cell [phone]

## 2017-10-05 NOTE — Progress Notes (Signed)
Occupational Therapy Treatment Patient Details Name: Samuel Flores MRN: [id number] DOB: Apr 29, 1941 Today's Date: 10/05/2017    History of present illness Pt is a 76 y.o. male who presented to ED on 09/26/17 acute onset central chest and epigastric pain. pt worked up for severe sepsis due to Klebsiella bactermia, suspected from pneumonia in LLL. CT head showed hypoattenuation in the left pons possible artifact. MRI of the brain negative for acute stroke. Pt also with chronic a-fib with RVR. Other pertinent PMH includes AKI, EtOH use, CAD,    OT comments  Pt progressing towards OT goals this session, HHOT and 24 hour supervision remain appropriate. Continue to follow in the acute setting for activity tolerance, and continued improvement in independence in ADL and functional transfers.  Follow Up Recommendations  Home health OT;Supervision/Assistance - 24 hour    Equipment Recommendations  None recommended by OT(Pt states he has A 3 in 1)    Recommendations for Other Services      Precautions / Restrictions Precautions Precautions: Fall Precaution Comments: watch BP Restrictions Weight Bearing Restrictions: No       Mobility Bed Mobility Overal bed mobility: Needs Assistance Bed Mobility: Supine to Sit     Supine to sit: Supervision;HOB elevated Sit to supine: Supervision   General bed mobility comments: C/o slight dizziness in sitting  Transfers Overall transfer level: Needs assistance Equipment used: Rolling walker (2 wheeled) Transfers: Sit to/from Stand Sit to Stand: Supervision         General transfer comment: supervision for safety and monitor in case of dizziness/orthostatics    Balance Overall balance assessment: Needs assistance Sitting-balance support: No upper extremity supported;Feet unsupported;Feet supported Sitting balance-Leahy Scale: Good     Standing balance support: Bilateral upper extremity supported;No upper extremity supported Standing  balance-Leahy Scale: Fair Standing balance comment: Can static stand with no UE support; reliant on BUE support for amb                           ADL either performed or assessed with clinical judgement   ADL Overall ADL's : Needs assistance/impaired     Grooming: Set up;Wash/dry hands;Wash/dry face;Sitting;Oral care         Lower Body Bathing Details (indicate cue type and reason): educated Pt on use of 3 in 1 as tub chair         Toilet Transfer: Min guard;RW           Functional mobility during ADLs: Min Youth worker     Praxis      Cognition Arousal/Alertness: Awake/alert Behavior During Therapy: WFL for tasks assessed/performed Overall Cognitive Status: Within Functional Limits for tasks assessed                                          Exercises     Shoulder Instructions       General Comments      Pertinent Vitals/ Pain       Pain Assessment: No/denies pain  Home Living                                          Prior Functioning/Environment  Frequency  Min 2X/week        Progress Toward Goals  OT Goals(current goals can now be found in the care plan section)  Progress towards OT goals: Progressing toward goals  Acute Rehab OT Goals Patient Stated Goal: Return home OT Goal Formulation: With patient Time For Goal Achievement: 10/17/17 Potential to Achieve Goals: Good  Plan Discharge plan remains appropriate;Frequency remains appropriate    Co-evaluation                 AM-PAC PT "6 Clicks" Daily Activity     Outcome Measure   Help from another person eating meals?: None Help from another person taking care of personal grooming?: A Little Help from another person toileting, which includes using toliet, bedpan, or urinal?: A Little Help from another person bathing (including washing, rinsing, drying)?: A Little Help from  another person to put on and taking off regular upper body clothing?: None Help from another person to put on and taking off regular lower body clothing?: A Little 6 Click Score: 20    End of Session Equipment Utilized During Treatment: Gait belt;Rolling walker  OT Visit Diagnosis: Unsteadiness on feet (R26.81);Other abnormalities of gait and mobility (R26.89);Muscle weakness (generalized) (M62.81)   Activity Tolerance Patient tolerated treatment well   Patient Left in bed;with call bell/phone within reach   Nurse Communication Mobility status        Time: 3154-0086 OT Time Calculation (min): 22 min  Charges: OT General Charges $OT Visit: 1 Visit OT Treatments $Self Care/Home Management : 8-22 mins  Hulda Humphrey OTR/L [place] 10/05/2017, 6:12 PM

## 2017-10-06 DIAGNOSIS — L8990 Pressure ulcer of unspecified site, unspecified stage: Secondary | ICD-10-CM

## 2017-10-06 LAB — CBC WITH DIFFERENTIAL/PLATELET
BASOS ABS: 0 10*3/uL (ref 0.0–0.1)
BASOS PCT: 0 %
EOS ABS: 0.1 10*3/uL (ref 0.0–0.7)
Eosinophils Relative: 0 %
HEMATOCRIT: 33.7 % — AB (ref 39.0–52.0)
HEMOGLOBIN: 11.4 g/dL — AB (ref 13.0–17.0)
Lymphocytes Relative: 6 %
Lymphs Abs: 1.1 10*3/uL (ref 0.7–4.0)
MCH: 35 pg — ABNORMAL HIGH (ref 26.0–34.0)
MCHC: 33.8 g/dL (ref 30.0–36.0)
MCV: 103.4 fL — ABNORMAL HIGH (ref 78.0–100.0)
Monocytes Absolute: 1 10*3/uL (ref 0.1–1.0)
Monocytes Relative: 6 %
NEUTROS ABS: 15 10*3/uL — AB (ref 1.7–7.7)
NEUTROS PCT: 88 %
Platelets: 243 10*3/uL (ref 150–400)
RBC: 3.26 MIL/uL — ABNORMAL LOW (ref 4.22–5.81)
RDW: 13.6 % (ref 11.5–15.5)
WBC: 17.1 10*3/uL — ABNORMAL HIGH (ref 4.0–10.5)

## 2017-10-06 MED ORDER — CEFUROXIME AXETIL 500 MG PO TABS: 500.0000 mg | ORAL_TABLET | Freq: Two times a day (BID) | ORAL | 0 refills | Status: AC

## 2017-10-06 MED ORDER — PREDNISONE 10 MG PO TABS
ORAL_TABLET | ORAL | 0 refills | Status: DC
Start: 2017-10-06 — End: 2017-11-23

## 2017-10-06 MED ORDER — FLUTICASONE PROPIONATE 50 MCG/ACT NA SUSP: 2.0000 | Freq: Every day | NASAL | 0 refills | Status: DC

## 2017-10-06 MED ORDER — FOLIC ACID 1 MG PO TABS: 1.0000 mg | ORAL_TABLET | Freq: Every day | ORAL | 0 refills | Status: DC

## 2017-10-06 MED ORDER — PANTOPRAZOLE SODIUM 40 MG PO TBEC: 40.0000 mg | DELAYED_RELEASE_TABLET | Freq: Every day | ORAL | 0 refills | Status: DC

## 2017-10-06 MED ORDER — METOPROLOL TARTRATE 50 MG PO TABS: 100.0000 mg | ORAL_TABLET | Freq: Two times a day (BID) | ORAL | 3 refills | Status: DC

## 2017-10-06 MED ORDER — METOPROLOL TARTRATE 50 MG PO TABS: 50.0000 mg | ORAL_TABLET | Freq: Two times a day (BID) | ORAL | 3 refills | Status: DC

## 2017-10-06 NOTE — Progress Notes (Signed)
Physical Therapy Treatment Patient Details Name: Samuel Flores MRN: [id number] DOB: Nov 29, 1940 Today's Date: 10/06/2017    History of Present Illness Pt is a 76 y.o. male who presented to ED on 09/26/17 acute onset central chest and epigastric pain. pt worked up for severe sepsis due to Klebsiella bactermia, suspected from pneumonia in [facility]. CT head showed hypoattenuation in the left pons possible artifact. MRI of the brain negative for acute stroke. Pt also with chronic a-fib with RVR. Other pertinent PMH includes AKI, EtOH use, CAD,     PT Comments    Pt progressing with mobility but limited by dizziness & SOB with activity.  02 sats at 98% RA in bed & sitting EOB.  Completed OOB activity/gait without supplemental 02.  Pt c/o dizziness & SOB with gt.  Ambulated 80' x 2 with min guard.   Unable to get reading of 02 sats with dynamap pulse ox.  When returned back to room & hooked back to pulse ox box in room, reading was at 95-98%.      Follow Up Recommendations   Home health PT; supervision/assistance- 24 hour     Equipment Recommendations    None recommended by PT (owns RW)   Recommendations for Other Services       Precautions / Restrictions Precautions Precautions: Fall Precaution Comments: watch BP Restrictions Weight Bearing Restrictions: No    Mobility  Bed Mobility Overal bed mobility: Needs Assistance Bed Mobility: Supine to Sit;Sit to Supine     Supine to sit: Supervision;HOB elevated Sit to supine: Supervision;HOB elevated   General bed mobility comments: cues to scoot hips closer to [facility] and towards center of bed prior to returning to supine.    Transfers Overall transfer level: Needs assistance Equipment used: Rolling walker (2 wheeled) Transfers: Sit to/from Stand Sit to Stand: Supervision         General transfer comment: cues for hand placement.    Ambulation/Gait Ambulation/Gait assistance: Min guard Ambulation Distance (Feet): 160 Feet(80' x  2) Assistive device: Rolling walker (2 wheeled) Gait Pattern/deviations: Step-through pattern;Decreased step length - right;Decreased step length - left;Decreased stride length Gait velocity: decreased   General Gait Details: guarding for safety due to pt reporting dizziness.  completed without supplemental 02.  unable to get reading via dynamap pulse ox or personal pulse ox.  provided cues for pursed lip breathing.    Stairs            Wheelchair Mobility    Modified Rankin (Stroke Patients Only)       Balance                                            Cognition Arousal/Alertness: Awake/alert Behavior During Therapy: WFL for tasks assessed/performed Overall Cognitive Status: Within Functional Limits for tasks assessed                                        Exercises      General Comments        Pertinent Vitals/Pain Pain Assessment: No/denies pain    Home Living                      Prior Function            PT  Goals (current goals can now be found in the care plan section) Acute Rehab PT Goals Patient Stated Goal: to go home PT Goal Formulation: With patient Time For Goal Achievement: 10/16/17 Potential to Achieve Goals: Good Progress towards PT goals: Progressing toward goals    Frequency           PT Plan      Co-evaluation              AM-PAC PT "6 Clicks" Daily Activity  Outcome Measure  Difficulty turning over in bed (including adjusting bedclothes, sheets and blankets)?: Unable Difficulty moving from lying on back to sitting on the side of the bed? : Unable Difficulty sitting down on and standing up from a chair with arms (e.g., wheelchair, bedside commode, etc,.)?: Unable Help needed moving to and from a bed to chair (including a wheelchair)?: A Little Help needed walking in hospital room?: A Little Help needed climbing 3-5 steps with a railing? : A Little 6 Click Score: 12     End of Session               Time: 2233-6122 PT Time Calculation (min) (ACUTE ONLY): 58 min  Charges:  $Gait Training: 23-37 mins $Therapeutic Activity: 23-37 mins                    G CodesSarajane Marek, [place] [phone] 10/06/2017    Sena Hitch 10/06/2017, 10:10 AM

## 2017-10-06 NOTE — Discharge Summary (Signed)
Physician Discharge Summary  Samuel Flores  BPZ:[id number]  DOB: 1941/01/10  DOA: 09/26/2017 PCP: Alycia Rossetti, MD  Admit date: 09/26/2017 Discharge date: 10/06/2017  Admitted From: Home  Disposition:  Home   Recommendations for Outpatient Follow-up:  1. Follow up with PCP in 3-5 days for lab workup  2. Please obtain BMP/CBC 3-5 days monitor renal function and WBC  3. Follow up with cardiology Dr. [name] 4. Complete prednisone taper for 14 days   Home Health: PT/OT, Aid and RN  Equipment/Devices: Walker, O2 supplementation 2L [place]   Discharge Condition: Stable   CODE STATUS: Full code  Diet recommendation: Heart Healthy   Brief/Interim Summary: Samuel Flores 76 y.o.malewith history ofdaily EtOH use, CAD, gout, and A.Fib on eliquis who presented to the ED with acute onset central chest and epigastric pain. On EMS arrival ASA and NTG given and SBP dropped to 90s. In the ED Trop was negative, EKG was w/o acute changes, and CTa demonstrated no dissection nor PE but did show mild inflamation around the CBD and gallbladder. RUQ [place] was unremarkable. Patient was found to have epigastric and RUQ tenderness. Lipase neg. AST 192, ALT 80, Bili 2.7  Patient was found to have Klebsiella bacteremia felt to be due to pneumonia. Patient Also developed afib and cardiology was consulted. Patient was treated with Cardizem ggt, subsequently switched to PO. He was treated with broad spectrum antibiotics that where narrowed to Ceftin. Patient was evaluated by physical therapy and recommended home health PT. Patient was discharge to follow up with PCP   Subjective: Patient seen and examined, he continues to improve, his breathing is much better. Remains afebrile, tolerating diet well and ambulating with physical therapy. No acute events overnight.   Discharge Diagnoses/Hospital Course:  Severe sepsis due to Klebsiella bacteremia - LLL PNA - sepsis physiology resolved  Suspected from  pneumonia initial xray showed left basilar opacity - repeated CXR today confirm PNA  CT abd with possible cholecystitis, [place] no gallstones or wall thickening no sonographic Murphy sign. Initially treated with zosyn, narrowed to Rocephin 11/14 switched to Ceftin to complete total of 14 days of abx  Wean O2 as tolerated  Prednisone taper plus PPI until steroids are completed  Continue Flonase for nasal congestion   Leukocytosis - improving  Felt to be related to prednisone Taper  UA negative, CXR improving, remains afebrile e  No other sources of infections identified   Acute metabolic encephalopathy - resolved  Now at baseline -  ? 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  Patient was placed on Angiomax and heparin was d/ced  Doppler US negative for DVT or SVT in left upper extremity  HIT antibodies negative   Angiomax d/ced -started on Eliquis   Chronic Afib with RVR Initially on Cardizem ggt, transitioned to PO  Cardiology was consulted and adjusted medications  Patient will be discharge on Diltiazem 180 mg daily and Metoprolol 50 mg BID  Follow up with Dr [name] for non invasive ischemic work up as outpatient  S/p failed DCCV (ganji) 4//2018 Cntinue Eliquis, ASA d/ced   Acute on chronic diastolic CHF  Chest x-ray showed airspace consolidation with pneumonia left lower lobe and left pleural effusion. Repeated CXR on 11/18 - significantly improved  ECHO shows EF 50-55% , unable to evaluated diastolic function  Treated with IV lasix 40 mg  No seem overloaded, Lasix was held. Patient  euvolemic will continue to hold lasix for now.  Enalapril and Aldactone were held by cardiology as well.  Defer to PCP if need to resume in the near future    Elevated liver enzymes - Resolved  Due to EtOH abuse and shock liver from sepsis   ETOH abuse Alcohol cessation discussed   AKI - Resolved    All other chronic medical condition were stable during the hospitalization.  Patient was seen by physical therapy, recommending home health PT  On the day of the discharge the patient's vitals were stable, and no other acute medical condition were reported by patient. the patient was felt safe to be discharge to home   Discharge Instructions  You were cared for by a hospitalist during your hospital stay. If you have any questions about your discharge medications or the care you received while you were in the hospital after you are discharged, you can call the unit and asked to speak with the hospitalist on call if the hospitalist that took care of you is not available. Once you are discharged, your primary care physician will handle any further medical issues. Please note that NO REFILLS for any discharge medications will be authorized once you are discharged, as it is imperative that you return to your primary care physician (or establish a relationship with a primary care physician if you do not have one) for your aftercare needs so that they can reassess your need for medications and monitor your lab values.  Discharge Instructions    Diet - low sodium heart healthy   Complete by:  As directed    Increase activity slowly   Complete by:  As directed      Allergies as of 10/06/2017   No Active Allergies     Medication List    STOP taking these medications   aspirin EC 81 MG tablet   enalapril 20 MG tablet Commonly known as:  VASOTEC   spironolactone 25 MG tablet Commonly known as:  ALDACTONE     TAKE these medications   allopurinol 100 MG tablet Commonly known as:  ZYLOPRIM Take 2 tablets (200 mg total) by mouth daily.   apixaban 5 MG Tabs tablet Commonly known as:  ELIQUIS Take 1 tablet (5 mg total) by mouth 2 (two) times daily.   atorvastatin 20 MG tablet Commonly known as:  LIPITOR TAKE 1/2 TABLET BY MOUTH DAILY   cefUROXime 500 MG tablet Commonly known as:   CEFTIN Take 1 tablet (500 mg total) by mouth 2 (two) times daily with a meal for 4 days.   diltiazem 180 MG 24 hr capsule Commonly known as:  DILACOR XR Take 180 mg daily by mouth.   fluticasone 50 MCG/ACT nasal spray Commonly known as:  FLONASE Place 2 sprays into both nostrils daily. Start taking on:  22/12/5425   folic acid 1 MG tablet Commonly known as:  FOLVITE Take 1 tablet (1 mg total) by mouth daily. Start taking on:  10/07/2017   metoprolol tartrate 50 MG tablet Commonly known as:  LOPRESSOR Take 1 tablet (50 mg total) by mouth 2 (two) times daily. What changed:    medication strength  how much to take   pantoprazole 40 MG tablet Commonly known as:  PROTONIX Take 1 tablet (40 mg total) by mouth daily at 12 noon for 14 days.   predniSONE 10 MG tablet Commonly known as:  DELTASONE Take 3 tablets for 4 days; Take 2 tablets for 3 days; Take 1 tablet  for 4 days            Durable Medical Equipment  (From admission, onward)        Start     Ordered   10/06/17 1053  DME Oxygen  Once    Question Answer Comment  Mode or (Route) Nasal cannula   Liters per Minute 2   Frequency Continuous (stationary and portable oxygen unit needed)   Oxygen delivery system Gas      10/06/17 1052     Follow-up Information    Home, [facility] Follow up.   Specialty:  Home Health Services Why:  For home health services Contact information: [place] [address] [phone]        Adrian Prows, MD. Call.   Specialty:  Cardiology Contact information: [address] [place] [address] [phone]          No Active Allergies  Consultations:  Cardiology - Dr [name]    Procedures/Studies: Ct Head Wo Contrast  Result Date: 09/28/2017 CLINICAL DATA:  Altered mental status EXAM: CT HEAD WITHOUT CONTRAST TECHNIQUE: Contiguous axial images were obtained from the base of the skull through the vertex without intravenous  contrast. COMPARISON:  Head CT 04/12/2011 FINDINGS: Brain: No mass lesion, intraparenchymal hemorrhage or extra-axial collection. There is a focal area of hypoattenuation within the anterior left pons on a single image. Brain parenchyma and CSF-containing spaces are otherwise normal for age. Vascular: No hyperdense vessel or unexpected calcification. Skull: Normal visualized skull base, calvarium and extracranial soft tissues. Sinuses/Orbits: No sinus fluid levels or advanced mucosal thickening. No mastoid effusion. Normal orbits. IMPRESSION: 1. Hypoattenuation in the left pons. This is an area that is prone to artifact, but the appearance is somewhat atypical and MRI should be considered to exclude an ischemic event at this location. 2. No intracranial hemorrhage or mass effect. Electronically Signed   By: Ulyses Jarred M.D.   On: 09/28/2017 20:13   Mr Brain Wo Contrast  Result Date: 09/29/2017 CLINICAL DATA:  Altered level of consciousness. Low-density in the left pons by CT. EXAM: MRI HEAD WITHOUT CONTRAST TECHNIQUE: Multiplanar, multiecho pulse sequences of the brain and surrounding structures were obtained without intravenous contrast. COMPARISON:  Head CT from yesterday FINDINGS: Brain: No acute infarction, hemorrhage, hydrocephalus, extra-axial collection or mass lesion. The left pontine finding a was artifactual. Mild to moderate for age cerebral atrophy that is generalized. No notable chronic ischemic injury. Vascular: Major flow voids are preserved Skull and upper cervical spine: Negative for marrow lesion. Remote right sided craniotomy, reportedly for subdural hematoma evacuation. Sinuses/Orbits: Bilateral cataract resection. Other: Intermittently motion degraded. IMPRESSION: 1. No acute finding.  The pontine finding by CT was artifactual. 2. Generalized cerebral atrophy. 3. Motion degraded. Electronically Signed   By: Monte Fantasia M.D.   On: 09/29/2017 18:21   Dg Chest Port 1 View  Result  Date: 09/30/2017 CLINICAL DATA:  Shortness of Breath EXAM: PORTABLE CHEST 1 VIEW COMPARISON:  09/28/2017 FINDINGS: Cardiac shadow is mildly enlarged but stable. Lungs are hypoinflated without focal infiltrate or sizable effusion. Postsurgical changes in the cervical spine are noted. Previously seen changes in the left base have cleared in the interval. IMPRESSION: Significant improved aeration without focal infiltrate at this time. Electronically Signed   By: Inez Catalina M.D.   On: 09/30/2017 15:52   Dg Chest Port 1 View  Result Date: 09/28/2017 CLINICAL DATA:  Shortness of Breath EXAM: PORTABLE CHEST 1 VIEW  COMPARISON:  Chest radiograph September 26, 2017 and chest CT September 26, 2017 FINDINGS: There is airspace consolidation in the left lower lobe with left pleural effusion. Lungs elsewhere are clear. Heart is upper normal in size with pulmonary vascularity are normal. No adenopathy. There is aortic atherosclerosis. No adenopathy. There is postoperative change in the lower cervical spine region. IMPRESSION: Airspace consolidation consistent with pneumonia left lower lobe with left pleural effusion. Right lung clear. Stable cardiac silhouette. There is aortic atherosclerosis. Aortic Atherosclerosis (ICD10-I70.0). Electronically Signed   By: Lowella Grip III M.D.   On: 09/28/2017 09:46   Dg Chest Portable 1 View  Result Date: 09/26/2017 CLINICAL DATA:  Acute onset of substernal chest pain.  Nausea. EXAM: PORTABLE CHEST 1 VIEW COMPARISON:  Chest radiograph performed 06/02/2013 FINDINGS: The lungs are mildly hypoexpanded. Mild left basilar airspace opacity likely reflects atelectasis. No pleural effusion or pneumothorax is seen. The cardiomediastinal silhouette is borderline normal in size. No acute osseous abnormalities are identified. Cervical spinal fusion hardware is noted. IMPRESSION: Lungs mildly hypoexpanded. Mild left basilar airspace opacity likely reflects atelectasis. Electronically Signed    By: Garald Balding M.D.   On: 09/26/2017 02:22   Ct Angio Chest/abd/pel For Dissection W And/or Wo Contrast  Result Date: 09/26/2017 CLINICAL DATA:  Acute onset of substernal chest pain. Nausea and left-sided abdominal pain. EXAM: CT ANGIOGRAPHY CHEST, ABDOMEN AND PELVIS TECHNIQUE: Multidetector CT imaging through the chest, abdomen and pelvis was performed using the standard protocol during bolus administration of intravenous contrast. Multiplanar reconstructed images and MIPs were obtained and reviewed to evaluate the vascular anatomy. CONTRAST:  143mL ISOVUE-370 IOPAMIDOL (ISOVUE-370) INJECTION 76% COMPARISON:  Chest radiograph performed earlier today at 2:34 a.m., and right upper quadrant ultrasound performed 04/29/2014 FINDINGS: CTA CHEST FINDINGS Cardiovascular: There is no evidence of aortic dissection. There is no evidence of aneurysmal dilatation. Mild calcification is seen along the aortic arch and proximal left subclavian artery. There is no evidence of significant pulmonary embolus. The heart remains normal in size. Scattered coronary artery calcifications are seen. Mediastinum/Nodes: The mediastinum is unremarkable in appearance. No mediastinal lymphadenopathy is seen. No pericardial effusion is identified. The thyroid gland is unremarkable. No axillary lymphadenopathy is appreciated. Lungs/Pleura: Minimal bibasilar atelectasis is noted. No pleural effusion or pneumothorax is seen. No masses are identified. Musculoskeletal: No acute osseous abnormalities are identified. Anterior cervical spinal fusion hardware is noted. The visualized musculature is unremarkable in appearance. Review of the MIP images confirms the above findings. CTA ABDOMEN AND PELVIS FINDINGS VASCULAR Aorta: There is no evidence of aortic dissection. There is no evidence of aneurysmal dilatation. Mild calcification is seen along the abdominal aorta and its branches. Celiac: The celiac trunk remains patent. SMA: The superior  mesenteric artery remains patent, with mild calcification at the origin of the superior mesenteric artery. Renals: The renal arteries appear patent bilaterally, with mild calcification at the origins of both renal arteries. IMA: The inferior mesenteric artery is unremarkable in appearance. Inflow: Mild scattered calcification is seen along the common and internal iliac arteries bilaterally, and along the left common femoral artery. Veins: The visualized venous structures are grossly unremarkable. Review of the MIP images confirms the above findings. NON-VASCULAR Hepatobiliary: Mild soft tissue inflammation is noted about the common bile duct and base of the gallbladder. This raises question for mild cholecystitis. The liver is unremarkable in appearance. Pancreas: The pancreas is within normal limits. Spleen: The spleen is unremarkable in appearance. Adrenals/Urinary Tract: The adrenal glands are unremarkable in appearance. Nonspecific  perinephric stranding is noted bilaterally. Mild bilateral renal scarring is noted. There is no evidence hydronephrosis. No renal or ureteral stones are identified. Stomach/Bowel: The stomach is unremarkable in appearance. The small bowel is within normal limits. The appendix is normal in caliber, without evidence of appendicitis. Prominent diverticula are noted at the cecum, and a few additional scattered diverticula are noted along the remainder of the colon. Mild wall thickening along the ascending colon may simply reflect intraluminal contents, though a mild infectious or inflammatory process might have a similar appearance. Lymphatic: No retroperitoneal or pelvic sidewall lymphadenopathy is seen. Reproductive: The bladder is mildly distended and grossly unremarkable. The patient is status post prostatectomy. Other: No additional soft tissue abnormalities are seen. Musculoskeletal: No acute osseous abnormalities are identified. The visualized musculature is unremarkable in  appearance. Review of the MIP images confirms the above findings. IMPRESSION: 1. No evidence of aortic dissection. No evidence of aneurysmal dilatation. 2. No evidence of significant pulmonary embolus. 3. Mild soft tissue inflammation about the common bile duct and base of the gallbladder. This raises question for mild cholecystitis, though it may remain within normal limits. Would correlate with the patient's symptoms. 4. Mild wall thickening along the ascending colon may simply reflect intraluminal contents, though a mild infectious or inflammatory process might have a similar appearance. 5. Aortic Atherosclerosis (ICD10-I70.0). 6. Scattered coronary artery calcifications. 7. Prominent diverticula at the cecum, and scattered diverticula along the remainder of the colon. Electronically Signed   By: Garald Balding M.D.   On: 09/26/2017 03:27   US Abdomen Limited Ruq  Result Date: 09/26/2017 CLINICAL DATA:  Acute onset of right upper quadrant abdominal pain. EXAM: ULTRASOUND ABDOMEN LIMITED RIGHT UPPER QUADRANT COMPARISON:  Right upper quadrant ultrasound performed 04/29/2014 FINDINGS: Gallbladder: No gallstones or wall thickening visualized. No sonographic Murphy sign noted by sonographer. Common bile duct: Diameter: 0.3 cm, within normal limits in caliber. Liver: No focal lesion identified. Within normal limits in parenchymal echogenicity. Portal vein is patent on color Doppler imaging with normal direction of blood flow towards the liver. IMPRESSION: Unremarkable ultrasound of the right upper quadrant. Electronically Signed   By: Garald Balding M.D.   On: 09/26/2017 04:44   ECHO 10/01/17 ------------------------------------------------------------------- Study Conclusions  - Left ventricle: The cavity size was normal. There was mild   concentric hypertrophy. Systolic function was normal. The   estimated ejection fraction was in the range of 50% to 55%.   Regional wall motion abnormalities cannot be  excluded. The study   is not technically sufficient to allow evaluation of LV diastolic   function. - Left atrium: The atrium was moderately dilated. - Right ventricle: The cavity size was mildly dilated. Wall   thickness was normal. - Atrial septum: No defect or patent foramen ovale was identified. - Pulmonary arteries: Systolic pressure was mildly increased. PA   peak pressure: 32 mm Hg (S).  Discharge Exam: Vitals:   10/06/17 0502 10/06/17 0900  BP: (!) 107/59   Pulse: 66 67  Resp: 18 18  Temp: 98.4 F (36.9 C)   SpO2: 96% 100%   Vitals:   10/05/17 2106 10/06/17 0500 10/06/17 0502 10/06/17 0900  BP: (!) 111/50  (!) 107/59   Pulse: 74  66 67  Resp: 18  18 18   Temp: 98 F (36.7 C)  98.4 F (36.9 C)   TempSrc: Oral  Oral   SpO2: 95%  96% 100%  Weight:  82.9 kg (182 lb 12.2 oz)    Height:  General: Pt is alert, awake, not in acute distress Cardiovascular: RRR, S1/S2 +, no rubs, no gallops Respiratory: CTA bilaterally, no wheezing, no rhonchi Abdominal: Soft, NT, ND, bowel sounds + Extremities: no edema, no cyanosis Skin: Dark discoloration on left upper extremities  The results of significant diagnostics from this hospitalization (including imaging, microbiology, ancillary and laboratory) are listed below for reference.     Microbiology: Recent Results (from the past 240 hour(s))  Culture, Urine     Status: None   Collection Time: 09/26/17  4:44 PM  Result Value Ref Range Status   Specimen Description URINE, CLEAN CATCH  Final   Special Requests NONE  Final   Culture NO GROWTH  Final   Report Status 09/27/2017 FINAL  Final  MRSA PCR Screening     Status: None   Collection Time: 09/26/17  8:42 PM  Result Value Ref Range Status   MRSA by PCR NEGATIVE NEGATIVE Final    Comment:        The GeneXpert MRSA Assay (FDA approved for NASAL specimens only), is one component of a comprehensive MRSA colonization surveillance program. It is not intended to  diagnose MRSA infection nor to guide or monitor treatment for MRSA infections.   Culture, blood (Routine X 2) w Reflex to ID Panel     Status: None   Collection Time: 09/28/17  7:13 PM  Result Value Ref Range Status   Specimen Description BLOOD RIGHT ARM  Final   Special Requests   Final    BOTTLES DRAWN AEROBIC ONLY Blood Culture adequate volume   Culture NO GROWTH 5 DAYS  Final   Report Status 10/03/2017 FINAL  Final  Culture, blood (Routine X 2) w Reflex to ID Panel     Status: None   Collection Time: 09/28/17  7:16 PM  Result Value Ref Range Status   Specimen Description BLOOD RIGHT HAND  Final   Special Requests   Final    BOTTLES DRAWN AEROBIC ONLY Blood Culture adequate volume   Culture NO GROWTH 5 DAYS  Final   Report Status 10/03/2017 FINAL  Final     Labs: BNP (last 3 results) Recent Labs    09/29/17 1019  BNP 956.2*   Basic Metabolic Panel: Recent Labs  Lab 09/30/17 0236 10/01/17 0149 10/02/17 0236 10/04/17 0228  NA 136 134* 135 135  K 4.1 3.8 3.1* 4.6  CL 102 100* 96* 101  CO2 26 26 30 28   GLUCOSE 139* 173* 154* 122*  BUN 20 21* 22* 22*  CREATININE 0.72 0.73 0.83 0.97  CALCIUM 7.2* 7.4* 7.7* 8.1*   Liver Function Tests: No results for input(s): AST, ALT, ALKPHOS, BILITOT, PROT, ALBUMIN in the last 168 hours. No results for input(s): LIPASE, AMYLASE in the last 168 hours. No results for input(s): AMMONIA in the last 168 hours. CBC: Recent Labs  Lab 10/02/17 0236 10/03/17 0300 10/04/17 0228 10/05/17 0537 10/06/17 0416  WBC 13.2* 14.1* 15.3* 19.3* 17.1*  NEUTROABS  --   --   --  15.7* 15.0*  HGB 11.6* 11.9* 11.5* 12.1* 11.4*  HCT 33.0* 35.1* 33.1* 36.1* 33.7*  MCV 100.3* 101.2* 102.5* 102.8* 103.4*  PLT 131* 185 217 253 243   Cardiac Enzymes: No results for input(s): CKTOTAL, CKMB, CKMBINDEX, TROPONINI in the last 168 hours. BNP: Invalid input(s): POCBNP CBG: Recent Labs  Lab 10/02/17 0748  GLUCAP 102*   D-Dimer No results for  input(s): DDIMER in the last 72 hours. Hgb A1c No results for input(s):  HGBA1C in the last 72 hours. Lipid Profile No results for input(s): CHOL, HDL, LDLCALC, TRIG, CHOLHDL, LDLDIRECT in the last 72 hours. Thyroid function studies No results for input(s): TSH, T4TOTAL, T3FREE, THYROIDAB in the last 72 hours.  Invalid input(s): FREET3 Anemia work up No results for input(s): VITAMINB12, FOLATE, FERRITIN, TIBC, IRON, RETICCTPCT in the last 72 hours. Urinalysis    Component Value Date/Time   COLORURINE AMBER (A) 10/05/2017 1555   APPEARANCEUR HAZY (A) 10/05/2017 1555   LABSPEC 1.017 10/05/2017 1555   PHURINE 5.0 10/05/2017 1555   GLUCOSEU NEGATIVE 10/05/2017 1555   HGBUR NEGATIVE 10/05/2017 1555   BILIRUBINUR NEGATIVE 10/05/2017 1555   KETONESUR NEGATIVE 10/05/2017 1555   PROTEINUR NEGATIVE 10/05/2017 1555   UROBILINOGEN 1.0 01/01/2013 1626   NITRITE NEGATIVE 10/05/2017 1555   LEUKOCYTESUR NEGATIVE 10/05/2017 1555   Sepsis Labs Invalid input(s): PROCALCITONIN,  WBC,  LACTICIDVEN Microbiology Recent Results (from the past 240 hour(s))  Culture, Urine     Status: None   Collection Time: 09/26/17  4:44 PM  Result Value Ref Range Status   Specimen Description URINE, CLEAN CATCH  Final   Special Requests NONE  Final   Culture NO GROWTH  Final   Report Status 09/27/2017 FINAL  Final  MRSA PCR Screening     Status: None   Collection Time: 09/26/17  8:42 PM  Result Value Ref Range Status   MRSA by PCR NEGATIVE NEGATIVE Final    Comment:        The GeneXpert MRSA Assay (FDA approved for NASAL specimens only), is one component of a comprehensive MRSA colonization surveillance program. It is not intended to diagnose MRSA infection nor to guide or monitor treatment for MRSA infections.   Culture, blood (Routine X 2) w Reflex to ID Panel     Status: None   Collection Time: 09/28/17  7:13 PM  Result Value Ref Range Status   Specimen Description BLOOD RIGHT ARM  Final   Special  Requests   Final    BOTTLES DRAWN AEROBIC ONLY Blood Culture adequate volume   Culture NO GROWTH 5 DAYS  Final   Report Status 10/03/2017 FINAL  Final  Culture, blood (Routine X 2) w Reflex to ID Panel     Status: None   Collection Time: 09/28/17  7:16 PM  Result Value Ref Range Status   Specimen Description BLOOD RIGHT HAND  Final   Special Requests   Final    BOTTLES DRAWN AEROBIC ONLY Blood Culture adequate volume   Culture NO GROWTH 5 DAYS  Final   Report Status 10/03/2017 FINAL  Final    Time coordinating discharge: 35 minutes  SIGNED:  Chipper Oman, MD  [facility] 10/06/2017, 10:53 AM  Pager please text page via  [email or web address] Password [email or web address]

## 2017-10-06 NOTE — Progress Notes (Signed)
SATURATION QUALIFICATIONS: (This note is used to comply with regulatory documentation for home oxygen)  Patient Saturations on Room Air at Rest = 88%  Patient Saturations on Room Air while Ambulating = 80%  Patient Saturations on 2 Liters of oxygen while Ambulating = 93%  Please briefly explain why patient needs home oxygen: patient de sats when ambulating a few feet in the hall. Patient needed to take a break to catch breath half way through the assessment.

## 2017-10-06 NOTE — Progress Notes (Signed)
Subjective:  Feels better No chest pain. Shortness of breath Wants to get out of bed and ambulate Wide complex beats seen on monitor, likely aberrant conduction.  Pressure ulcer per nursing notes.  Objective:  Vital Signs in the last 24 hours: Temp:  [97.7 F (36.5 C)-98.4 F (36.9 C)] 98.4 F (36.9 C) (11/24 0502) Pulse Rate:  [58-75] 67 (11/24 0900) Resp:  [18] 18 (11/24 0900) BP: (98-130)/(50-85) 107/59 (11/24 0502) SpO2:  [95 %-100 %] 100 % (11/24 0900) FiO2 (%):  [28 %] 28 % (11/24 0900) Weight:  [82.9 kg (182 lb 12.2 oz)] 82.9 kg (182 lb 12.2 oz) (11/24 0500)  Intake/Output from previous day: 11/23 0701 - 11/24 0700 In: 200 [P.O.:200] Out: 550 [Urine:550] Intake/Output from this shift: No intake/output data recorded.  Physical Exam:  Constitutional: He isoriented to person, place, and time. He appearswell-developedand well-nourished.  HENT:  Head:Atraumatic.  Eyes:Conjunctivaeare normal.  Neck:Neck supple.  Cardiovascular: Exam revealsno friction rub. No murmurheard. S1 variable, S2 normal Respiratory:Effort normaland faint scattered crackles present.  YI:RSWN.Bowel sounds are normal.  Musculoskeletal:Normal range of motion.  Neurological: He isalertand oriented to person, place, and time.  Skin: Skin iswarm.Left arm ecchymosis noted, improved. Pressure ulcer with dressing on sacral spine Psychiatric: Appears anxious.     Lab Results: Recent Labs    10/05/17 0537 10/06/17 0416  WBC 19.3* 17.1*  HGB 12.1* 11.4*  PLT 253 243   Recent Labs    10/04/17 0228  NA 135  K 4.6  CL 101  CO2 28  GLUCOSE 122*  BUN 22*  CREATININE 0.97    Imaging: CXR 09/30/2017 CLINICAL DATA:  Shortness of Breath  EXAM: PORTABLE CHEST 1 VIEW  COMPARISON:  09/28/2017  FINDINGS: Cardiac shadow is mildly enlarged but stable. Lungs are hypoinflated without focal infiltrate or sizable effusion. Postsurgical changes in the cervical spine are  noted. Previously seen changes in the left base have cleared in the interval.  IMPRESSION: Significant improved aeration without focal infiltrate at this time.   Electronically Signed   By: Inez Catalina M.D.   On: 09/30/2017 15:52  Cardiac Studies: Echocardiogram 10/01/2017: Study Conclusions  - Left ventricle: The cavity size was normal. There was mild   concentric hypertrophy. Systolic function was normal. The   estimated ejection fraction was in the range of 50% to 55%.   Regional wall motion abnormalities cannot be excluded. The study   is not technically sufficient to allow evaluation of LV diastolic   function. - Left atrium: The atrium was moderately dilated. - Right ventricle: The cavity size was mildly dilated. Wall   thickness was normal. - Atrial septum: No defect or patent foramen ovale was identified. - Pulmonary arteries: Systolic pressure was mildly increased. PA   peak pressure: 32 mm Hg (S).    Assessment:  Afib w/RVR: Secondary to sepsis. Now resolved CHA2DS2-VASCScore: Risk Score3, Yearly risk of stroke 3.2% CAD: Moderate diffuse CAD on cath in 2014. H/o alcoholic cardiomyopathy. EF low normal H/O Liver cirrohosis. Abnormal liver enzymes Klebsiella pneumonia and LLL pneumonia Thrombocytopenia: Resolving  Recommendations: Discharge home on diltiazem CD 180 mg, metoprolol 50 mg bid. Continue apixaban 5 mg bid Rest of the management per the primary team   Cardiology signing off. Will arrange outpatient follow up and noninvasive ischemia testing given Afib, nonsustained wide complex tachycardia.      LOS: 10 days    Elania Crowl J Jalyssa Fleisher 10/06/2017, 9:23 AM  [place], MD [facility] [place] Pager: [phone] Office: [phone]  If no answer Cell [phone]

## 2017-10-06 NOTE — Care Management Note (Addendum)
Case Management Note Previous Note Created by   Patient Details  Name: Samuel Flores MRN: [id number] Date of Birth: June 24, 1941  Subjective/Objective:                 Spoke with patient at the bedside and wife over the phone. Patient states he has a walker and 3/1 at home, has experience with [facility] and would like to use again. Currently on oxygent, non dependent.    Action/Plan:  Referral placed to [facility], clinical liaison Tim H.  Need to watch for Home Oxygen.    Expected Discharge Date:  10/06/17               Expected Discharge Plan:  [place]  In-House Referral:     Discharge planning Services  CM Consult  Post Acute Care Choice:  Home Health Choice offered to:  Patient, Spouse  DME Arranged:  Oxygen DME Agency:  [place]:  RN, PT, OT, Nurse's Aide [place] Agency:  [facility] (now [facility])  Status of Service:  In process, will continue to follow  If discussed at Long Length of Stay Meetings, dates discussed:    Additional Comments: 10/06/2017  Pulmonary Note has been entered in [place] will accept referral for DME  Pt to discharge home via private vehicle.  Pt's wife will provide recommended supervision at discharge.  CM confirmed that pt's choice of [place] agency continues to be [facility] - referral already given to agency - pt declined SW.  CM informed agency of pts discharge home today.  CM offered DME choice for home oxygen - pt chose [facility] - agency contacted and tentative referral accepted (requiring pulm note) .  CM spoke with bedside nurse Thurmond Butts and informed that home oxygen can not be arranged without pulm amb note - bedside nurse will walk with pt and document required information on specific template in [place], [place], RN 10/06/2017, 11:20 AM

## 2017-10-06 NOTE — Discharge Instructions (Signed)

## 2017-10-09 DIAGNOSIS — I482 Chronic atrial fibrillation: Secondary | ICD-10-CM | POA: Diagnosis not present

## 2017-10-09 DIAGNOSIS — Z8679 Personal history of other diseases of the circulatory system: Secondary | ICD-10-CM | POA: Diagnosis not present

## 2017-10-09 DIAGNOSIS — K7031 Alcoholic cirrhosis of liver with ascites: Secondary | ICD-10-CM | POA: Diagnosis not present

## 2017-10-09 DIAGNOSIS — I2510 Atherosclerotic heart disease of native coronary artery without angina pectoris: Secondary | ICD-10-CM | POA: Diagnosis not present

## 2017-10-12 ENCOUNTER — Other Ambulatory Visit: Payer: Self-pay

## 2017-10-12 ENCOUNTER — Ambulatory Visit (INDEPENDENT_AMBULATORY_CARE_PROVIDER_SITE_OTHER): Payer: Medicare Other | Admitting: Family Medicine

## 2017-10-12 ENCOUNTER — Telehealth: Payer: Self-pay | Admitting: *Deleted

## 2017-10-12 ENCOUNTER — Encounter: Payer: Self-pay | Admitting: Family Medicine

## 2017-10-12 VITALS — BP 128/72 | HR 110 | Temp 98.1°F | Resp 16 | Ht 69.0 in | Wt 185.0 lb

## 2017-10-12 DIAGNOSIS — K7030 Alcoholic cirrhosis of liver without ascites: Secondary | ICD-10-CM

## 2017-10-12 DIAGNOSIS — I482 Chronic atrial fibrillation, unspecified: Secondary | ICD-10-CM

## 2017-10-12 DIAGNOSIS — J189 Pneumonia, unspecified organism: Secondary | ICD-10-CM | POA: Insufficient documentation

## 2017-10-12 DIAGNOSIS — I426 Alcoholic cardiomyopathy: Secondary | ICD-10-CM | POA: Diagnosis not present

## 2017-10-12 DIAGNOSIS — N179 Acute kidney failure, unspecified: Secondary | ICD-10-CM | POA: Diagnosis not present

## 2017-10-12 DIAGNOSIS — A419 Sepsis, unspecified organism: Secondary | ICD-10-CM

## 2017-10-12 DIAGNOSIS — I5033 Acute on chronic diastolic (congestive) heart failure: Secondary | ICD-10-CM | POA: Diagnosis not present

## 2017-10-12 DIAGNOSIS — I2510 Atherosclerotic heart disease of native coronary artery without angina pectoris: Secondary | ICD-10-CM | POA: Diagnosis not present

## 2017-10-12 DIAGNOSIS — I272 Pulmonary hypertension, unspecified: Secondary | ICD-10-CM | POA: Diagnosis not present

## 2017-10-12 DIAGNOSIS — K7460 Unspecified cirrhosis of liver: Secondary | ICD-10-CM | POA: Diagnosis not present

## 2017-10-12 NOTE — Patient Instructions (Signed)
CXR to be done 2nd week in December [facility]  [place] [address] COntinue current medications REferral to [facility] for his liver F/U 2nd week of January

## 2017-10-12 NOTE — Telephone Encounter (Signed)
Received call from [place], [place] [phone]~ telephone.   Requested order to extend [facility] SN 2x 3weeks, 1x 5weeks, and 2 PRN visits to monitor VS, medication management and patient education for post hospitalization PNA, CHF.  VO given.

## 2017-10-12 NOTE — Assessment & Plan Note (Signed)
Sepsis in the setting of pneumonia.  He is completed his antibiotics.  I will check his CBC and metabolic panel today to make sure that his leukocytosis has resolved as well as his acute kidney injury and hopefully his liver enzymes have returned at least down to their baseline which were still elevated secondary to his cirrhosis. Pneumonia he will have a repeat chest x-ray in 4 weeks we will plan to do his pneumonia vaccine and her follow-up appointment in January.  He agrees to see gastroenterology we will set this up for January for his cirrhosis.  At this time he is measuring his abdominal girth and reporting this to his cardiologist per his wife.  A. fib this is being followed by cardiology he is currently rate controlled but is chronically in A. fib.  He is also on Eliquis.  Alcohol abuse states that he has not had any alcohol since discharge.
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
## 2017-10-12 NOTE — Telephone Encounter (Signed)
Okay 

## 2017-10-13 LAB — CBC WITH DIFFERENTIAL/PLATELET
BASOS ABS: 20 {cells}/uL (ref 0–200)
Basophils Relative: 0.1 %
EOS PCT: 0.1 %
Eosinophils Absolute: 20 cells/uL (ref 15–500)
HEMATOCRIT: 37 % — AB (ref 38.5–50.0)
HEMOGLOBIN: 12.5 g/dL — AB (ref 13.2–17.1)
LYMPHS ABS: 355 {cells}/uL — AB (ref 850–3900)
MCH: 34.7 pg — ABNORMAL HIGH (ref 27.0–33.0)
MCHC: 33.8 g/dL (ref 32.0–36.0)
MCV: 102.8 fL — ABNORMAL HIGH (ref 80.0–100.0)
MONOS PCT: 1.2 %
MPV: 10.7 fL (ref 7.5–12.5)
NEUTROS ABS: 19070 {cells}/uL — AB (ref 1500–7800)
Neutrophils Relative %: 96.8 %
Platelets: 264 10*3/uL (ref 140–400)
RBC: 3.6 10*6/uL — AB (ref 4.20–5.80)
RDW: 11.8 % (ref 11.0–15.0)
Total Lymphocyte: 1.8 %
WBC mixed population: 236 cells/uL (ref 200–950)
WBC: 19.7 10*3/uL — AB (ref 3.8–10.8)

## 2017-10-13 LAB — COMPREHENSIVE METABOLIC PANEL
AG RATIO: 1.5 (calc) (ref 1.0–2.5)
ALBUMIN MSPROF: 3.2 g/dL — AB (ref 3.6–5.1)
ALT: 134 U/L — AB (ref 9–46)
AST: 67 U/L — ABNORMAL HIGH (ref 10–35)
Alkaline phosphatase (APISO): 150 U/L — ABNORMAL HIGH (ref 40–115)
BUN: 19 mg/dL (ref 7–25)
CO2: 24 mmol/L (ref 20–32)
Calcium: 8.5 mg/dL — ABNORMAL LOW (ref 8.6–10.3)
Chloride: 101 mmol/L (ref 98–110)
Creat: 1.03 mg/dL (ref 0.70–1.18)
GLUCOSE: 242 mg/dL — AB (ref 65–99)
Globulin: 2.2 g/dL (calc) (ref 1.9–3.7)
POTASSIUM: 5 mmol/L (ref 3.5–5.3)
SODIUM: 138 mmol/L (ref 135–146)
TOTAL PROTEIN: 5.4 g/dL — AB (ref 6.1–8.1)
Total Bilirubin: 2.1 mg/dL — ABNORMAL HIGH (ref 0.2–1.2)

## 2017-10-15 ENCOUNTER — Other Ambulatory Visit: Payer: Self-pay | Admitting: *Deleted

## 2017-10-15 DIAGNOSIS — K7460 Unspecified cirrhosis of liver: Secondary | ICD-10-CM | POA: Diagnosis not present

## 2017-10-15 DIAGNOSIS — I482 Chronic atrial fibrillation: Secondary | ICD-10-CM | POA: Diagnosis not present

## 2017-10-15 DIAGNOSIS — I426 Alcoholic cardiomyopathy: Secondary | ICD-10-CM | POA: Diagnosis not present

## 2017-10-15 DIAGNOSIS — I2510 Atherosclerotic heart disease of native coronary artery without angina pectoris: Secondary | ICD-10-CM | POA: Diagnosis not present

## 2017-10-15 DIAGNOSIS — I5033 Acute on chronic diastolic (congestive) heart failure: Secondary | ICD-10-CM | POA: Diagnosis not present

## 2017-10-15 DIAGNOSIS — I272 Pulmonary hypertension, unspecified: Secondary | ICD-10-CM | POA: Diagnosis not present

## 2017-10-17 ENCOUNTER — Other Ambulatory Visit: Payer: Self-pay | Admitting: *Deleted

## 2017-10-17 ENCOUNTER — Other Ambulatory Visit: Payer: Medicare Other

## 2017-10-17 DIAGNOSIS — D72829 Elevated white blood cell count, unspecified: Secondary | ICD-10-CM

## 2017-10-17 LAB — CBC WITH DIFFERENTIAL/PLATELET
Basophils Absolute: 31 cells/uL (ref 0–200)
Basophils Relative: 0.2 %
Eosinophils Absolute: 156 cells/uL (ref 15–500)
Eosinophils Relative: 1 %
HCT: 38.4 % — ABNORMAL LOW (ref 38.5–50.0)
Hemoglobin: 13 g/dL — ABNORMAL LOW (ref 13.2–17.1)
Lymphs Abs: 764 cells/uL — ABNORMAL LOW (ref 850–3900)
MCH: 33.9 pg — ABNORMAL HIGH (ref 27.0–33.0)
MCHC: 33.9 g/dL (ref 32.0–36.0)
MCV: 100 fL (ref 80.0–100.0)
MPV: 10.4 fL (ref 7.5–12.5)
Monocytes Relative: 4.2 %
Neutro Abs: 13993 cells/uL — ABNORMAL HIGH (ref 1500–7800)
Neutrophils Relative %: 89.7 %
Platelets: 223 10*3/uL (ref 140–400)
RBC: 3.84 10*6/uL — ABNORMAL LOW (ref 4.20–5.80)
RDW: 12.2 % (ref 11.0–15.0)
Total Lymphocyte: 4.9 %
WBC mixed population: 655 cells/uL (ref 200–950)
WBC: 15.6 10*3/uL — ABNORMAL HIGH (ref 3.8–10.8)

## 2017-11-07 DIAGNOSIS — Z8679 Personal history of other diseases of the circulatory system: Secondary | ICD-10-CM | POA: Diagnosis not present

## 2017-11-07 DIAGNOSIS — I2510 Atherosclerotic heart disease of native coronary artery without angina pectoris: Secondary | ICD-10-CM | POA: Diagnosis not present

## 2017-11-07 DIAGNOSIS — I482 Chronic atrial fibrillation: Secondary | ICD-10-CM | POA: Diagnosis not present

## 2017-11-07 DIAGNOSIS — K7031 Alcoholic cirrhosis of liver with ascites: Secondary | ICD-10-CM | POA: Diagnosis not present

## 2017-11-23 ENCOUNTER — Ambulatory Visit (INDEPENDENT_AMBULATORY_CARE_PROVIDER_SITE_OTHER): Payer: Medicare Other | Admitting: Family Medicine

## 2017-11-23 ENCOUNTER — Other Ambulatory Visit: Payer: Self-pay

## 2017-11-23 ENCOUNTER — Encounter: Payer: Self-pay | Admitting: Family Medicine

## 2017-11-23 VITALS — BP 130/82 | HR 100 | Temp 97.9°F | Resp 16 | Ht 69.0 in | Wt 180.0 lb

## 2017-11-23 DIAGNOSIS — K7030 Alcoholic cirrhosis of liver without ascites: Secondary | ICD-10-CM

## 2017-11-23 DIAGNOSIS — I10 Essential (primary) hypertension: Secondary | ICD-10-CM

## 2017-11-23 DIAGNOSIS — E441 Mild protein-calorie malnutrition: Secondary | ICD-10-CM

## 2017-11-23 DIAGNOSIS — I482 Chronic atrial fibrillation, unspecified: Secondary | ICD-10-CM

## 2017-11-23 DIAGNOSIS — Z23 Encounter for immunization: Secondary | ICD-10-CM | POA: Diagnosis not present

## 2017-11-23 DIAGNOSIS — E46 Unspecified protein-calorie malnutrition: Secondary | ICD-10-CM | POA: Insufficient documentation

## 2017-11-23 NOTE — Progress Notes (Signed)
   Subjective:    Patient ID: Samuel Flores, male    DOB: 03/23/1941, 77 y.o.   MRN: [id number]  Patient presents for Follow-up (is not fasting)  Pt here to f/u, no concerns today. Denies any Chest pain, cough. Had PNA with sepsis back in Nov. He did not get f/u CXR in Dec, but feels fine. Seen by his cardiologist,states bP was dropping told to take his "capsule" at bedtime since then has been fine  Weight down 5lbs, states he is not drinking, but does not have best appetite, will drink 1 ensure and nibble. No Abd pain, no vomiting. He has appt to see GI in 2 weeks.    Review Of Systems:  GEN- denies fatigue, fever, +weight loss,weakness, recent illness HEENT- denies eye drainage, change in vision, nasal discharge, CVS- denies chest pain, palpitations RESP- denies SOB, cough, wheeze ABD- denies N/V, change in stools, abd pain GU- denies dysuria, hematuria, dribbling, incontinence MSK- denies joint pain, muscle aches, injury Neuro- denies headache, dizziness, syncope, seizure activity       Objective:    BP 130/82   Pulse 100   Temp 97.9 F (36.6 C) (Oral)   Resp 16   Ht 5\' 9"  (1.753 m)   Wt 180 lb (81.6 kg)   SpO2 97%   BMI 26.58 kg/m  GEN- NAD, alert and oriented x3 HEENT- PERRL, EOMI, non injected sclera, pink conjunctiva, MMM, oropharynx clear Neck- Supple, no thyromegaly CVS- irregular rhythem, normal rate no murmur RESP-CTAB ABD-NABS,soft,NT,ND EXT-trace ankle edema Pulses- Radial 2+        Assessment & Plan:      Problem List Items Addressed This Visit      Unprioritized   HBP (high blood pressure) - Primary   Protein-calorie malnutrition (HCC)   Cirrhosis of liver ([place])    Concern is cirrhosis , chronic ETOH, has worsned his malnutrition, he declines taking appetite medication. Will increase ensure to BID  He will see GI this month      A-fib (HCC)    Rate controlled, BP looks good  Given prevnar 13 vaccine Since sats are good 2 months out  from PNA and last xray did not show specific infiltrate okay to hold off on repeat CXR at this time        Other Visit Diagnoses    Need for prophylactic vaccination against Streptococcus pneumoniae (pneumococcus)       Relevant Orders   Pneumococcal conjugate vaccine 13-valent IM (Completed)      Note: This dictation was prepared with Dragon dictation along with smaller phrase technology. Any transcriptional errors that result from this process are unintentional.

## 2017-11-23 NOTE — Patient Instructions (Signed)
Drink ensure twice a day  Continue current medications F/U 4 months

## 2017-11-24 ENCOUNTER — Encounter: Payer: Self-pay | Admitting: Family Medicine

## 2017-11-24 NOTE — Assessment & Plan Note (Signed)
Concern is cirrhosis , chronic ETOH, has worsned his malnutrition, he declines taking appetite medication. Will increase ensure to BID  He will see GI this month

## 2017-11-24 NOTE — Assessment & Plan Note (Signed)
Rate controlled, BP looks good  Given prevnar 13 vaccine Since sats are good 2 months out from PNA and last xray did not show specific infiltrate okay to hold off on repeat CXR at this time

## 2017-12-03 DIAGNOSIS — R748 Abnormal levels of other serum enzymes: Secondary | ICD-10-CM | POA: Diagnosis not present

## 2017-12-03 DIAGNOSIS — I4891 Unspecified atrial fibrillation: Secondary | ICD-10-CM | POA: Diagnosis not present

## 2017-12-03 DIAGNOSIS — R945 Abnormal results of liver function studies: Secondary | ICD-10-CM | POA: Diagnosis not present

## 2017-12-03 DIAGNOSIS — D649 Anemia, unspecified: Secondary | ICD-10-CM | POA: Diagnosis not present

## 2017-12-03 DIAGNOSIS — R9389 Abnormal findings on diagnostic imaging of other specified body structures: Secondary | ICD-10-CM | POA: Diagnosis not present

## 2017-12-03 DIAGNOSIS — Z862 Personal history of diseases of the blood and blood-forming organs and certain disorders involving the immune mechanism: Secondary | ICD-10-CM | POA: Diagnosis not present

## 2017-12-03 DIAGNOSIS — R140 Abdominal distension (gaseous): Secondary | ICD-10-CM | POA: Diagnosis not present

## 2017-12-26 ENCOUNTER — Other Ambulatory Visit: Payer: Self-pay | Admitting: *Deleted

## 2017-12-26 MED ORDER — APIXABAN 5 MG PO TABS: 5.0000 mg | ORAL_TABLET | Freq: Two times a day (BID) | ORAL | 3 refills | Status: DC

## 2017-12-27 ENCOUNTER — Encounter: Payer: Self-pay | Admitting: *Deleted

## 2018-01-02 ENCOUNTER — Other Ambulatory Visit: Payer: Self-pay

## 2018-01-02 MED ORDER — APIXABAN 5 MG PO TABS: 5.0000 mg | ORAL_TABLET | Freq: Two times a day (BID) | ORAL | 3 refills | Status: DC

## 2018-01-16 ENCOUNTER — Telehealth: Payer: Self-pay | Admitting: *Deleted

## 2018-01-16 NOTE — Telephone Encounter (Signed)
Received call from patient.   Reports that he now has insurance that will cover Eliquis. ($140/ 90 days). States that he no longer requires assistance forms.

## 2018-02-04 DIAGNOSIS — D696 Thrombocytopenia, unspecified: Secondary | ICD-10-CM | POA: Diagnosis not present

## 2018-02-04 DIAGNOSIS — Z8679 Personal history of other diseases of the circulatory system: Secondary | ICD-10-CM | POA: Diagnosis not present

## 2018-02-04 DIAGNOSIS — K7031 Alcoholic cirrhosis of liver with ascites: Secondary | ICD-10-CM | POA: Diagnosis not present

## 2018-02-04 DIAGNOSIS — I482 Chronic atrial fibrillation: Secondary | ICD-10-CM | POA: Diagnosis not present

## 2018-02-14 DIAGNOSIS — I482 Chronic atrial fibrillation: Secondary | ICD-10-CM | POA: Diagnosis not present

## 2018-02-18 DIAGNOSIS — D696 Thrombocytopenia, unspecified: Secondary | ICD-10-CM | POA: Diagnosis not present

## 2018-02-18 DIAGNOSIS — K7031 Alcoholic cirrhosis of liver with ascites: Secondary | ICD-10-CM | POA: Diagnosis not present

## 2018-02-18 DIAGNOSIS — I482 Chronic atrial fibrillation: Secondary | ICD-10-CM | POA: Diagnosis not present

## 2018-02-18 DIAGNOSIS — Z8679 Personal history of other diseases of the circulatory system: Secondary | ICD-10-CM | POA: Diagnosis not present

## 2018-02-20 ENCOUNTER — Other Ambulatory Visit: Payer: Self-pay | Admitting: Gastroenterology

## 2018-02-20 DIAGNOSIS — R945 Abnormal results of liver function studies: Secondary | ICD-10-CM | POA: Diagnosis not present

## 2018-02-20 DIAGNOSIS — R140 Abdominal distension (gaseous): Secondary | ICD-10-CM | POA: Diagnosis not present

## 2018-02-20 DIAGNOSIS — R9389 Abnormal findings on diagnostic imaging of other specified body structures: Secondary | ICD-10-CM

## 2018-02-20 DIAGNOSIS — I4891 Unspecified atrial fibrillation: Secondary | ICD-10-CM | POA: Diagnosis not present

## 2018-02-23 ENCOUNTER — Other Ambulatory Visit: Payer: Medicare Other

## 2018-02-28 DIAGNOSIS — R945 Abnormal results of liver function studies: Secondary | ICD-10-CM | POA: Diagnosis not present

## 2018-03-24 ENCOUNTER — Other Ambulatory Visit: Payer: Self-pay | Admitting: Family Medicine

## 2018-03-25 ENCOUNTER — Ambulatory Visit (INDEPENDENT_AMBULATORY_CARE_PROVIDER_SITE_OTHER): Payer: Medicare Other | Admitting: Family Medicine

## 2018-03-25 ENCOUNTER — Encounter: Payer: Self-pay | Admitting: Family Medicine

## 2018-03-25 VITALS — BP 122/80 | HR 98 | Temp 97.9°F | Resp 16 | Ht 69.0 in | Wt 167.0 lb

## 2018-03-25 DIAGNOSIS — E441 Mild protein-calorie malnutrition: Secondary | ICD-10-CM | POA: Diagnosis not present

## 2018-03-25 DIAGNOSIS — I482 Chronic atrial fibrillation, unspecified: Secondary | ICD-10-CM

## 2018-03-25 DIAGNOSIS — K7030 Alcoholic cirrhosis of liver without ascites: Secondary | ICD-10-CM | POA: Diagnosis not present

## 2018-03-25 DIAGNOSIS — R634 Abnormal weight loss: Secondary | ICD-10-CM | POA: Diagnosis not present

## 2018-03-25 NOTE — Assessment & Plan Note (Signed)
Worsening protein malnutrition with significant weight loss.  Try to explain to him that this is not due to diuresis.  He does not want any further work-up at this time discussed that I review his records from GI there may be something in there convincing I can get him to proceed with the CT scan.  My concern is that his cirrhosis of the liver has worsen or he may even have some type of cancer.  Discussed this with him.  Have blood work done by cardiology and GI recently so I will obtain those records.  Advised him to continue drinking Ensure or some type of protein supplement.  Will not add any new medications today.

## 2018-03-25 NOTE — Progress Notes (Signed)
   Subjective:    Patient ID: Samuel Flores, male    DOB: November 27, 1940, 77 y.o.   MRN: [id number]  Patient presents for Follow-up (is not fasting)  Pt here to f/u, no new concerns  Has chronic SOB which is unchanged He has lost weight was 180lbs in Jan and today 167lbs  At home he states he weighed 188lbs in March He saw Cardiology- Dr. Nadyne Coombes- now on Demadex 10mg   Appetite is up and down, no pain with eating , has soft stool, but no diarrhea Seen at [facility] for due to elevated LFT/GERD Declined getting CT scan from GI He has been drinking ensure most days Ask about declining the scan and further work-up he states that he would rather talk to Dr. [name] on Monday he thinks it is his fluid pill that is causing the loose all of the weight??      Review Of Systems:  GEN- denies fatigue, fever, +weight loss,weakness, recent illness HEENT- denies eye drainage, change in vision, nasal discharge, CVS- denies chest pain, palpitations RESP- +chronic SOB, denies cough, wheeze ABD- denies N/V, change in stools, abd pain GU- denies dysuria, hematuria, dribbling, incontinence MSK- denies joint pain, muscle aches, injury Neuro- denies headache, dizziness, syncope, seizure activity       Objective:    BP 122/80   Pulse 98   Temp 97.9 F (36.6 C) (Oral)   Resp 16   Ht 5\' 9"  (1.753 m)   Wt 167 lb (75.8 kg)   SpO2 96%   BMI 24.66 kg/m  GEN- NAD, alert and oriented x3, no jaundice  HEENT- PERRL, EOMI, non injected sclera, pink conjunctiva, MMM, oropharynx clear Neck- Supple, no thyromegaly CVS- irregular rhythem, no murmur RESP-CTAB ABD-NABS,soft,NT,ND EXT- No edema Pulses- Radial  2+        Assessment & Plan:      Problem List Items Addressed This Visit      Unprioritized   Cirrhosis of liver (HCC)   Protein-calorie malnutrition ([place])    Worsening protein malnutrition with significant weight loss.  Try to explain to him that this is not due to diuresis.  He does not want  any further work-up at this time discussed that I review his records from GI there may be something in there convincing I can get him to proceed with the CT scan.  My concern is that his cirrhosis of the liver has worsen or he may even have some type of cancer.  Discussed this with him.  Have blood work done by cardiology and GI recently so I will obtain those records.  Advised him to continue drinking Ensure or some type of protein supplement.  Will not add any new medications today.      A-fib (HCC)    Controlled and on anticoagulation.      Relevant Medications   torsemide (DEMADEX) 20 MG tablet   spironolactone (ALDACTONE) 50 MG tablet    Other Visit Diagnoses    Weight loss    -  Primary      Note: This dictation was prepared with Dragon dictation along with smaller phrase technology. Any transcriptional errors that result from this process are unintentional.

## 2018-03-25 NOTE — Assessment & Plan Note (Signed)
Controlled and on anticoagulation.

## 2018-03-25 NOTE — Patient Instructions (Signed)
Release of records- [facility]  Continue current medications F/U pending review of records

## 2018-04-01 DIAGNOSIS — I482 Chronic atrial fibrillation: Secondary | ICD-10-CM | POA: Diagnosis not present

## 2018-04-01 DIAGNOSIS — K7031 Alcoholic cirrhosis of liver with ascites: Secondary | ICD-10-CM | POA: Diagnosis not present

## 2018-04-01 DIAGNOSIS — D696 Thrombocytopenia, unspecified: Secondary | ICD-10-CM | POA: Diagnosis not present

## 2018-07-01 DIAGNOSIS — D696 Thrombocytopenia, unspecified: Secondary | ICD-10-CM | POA: Diagnosis not present

## 2018-07-01 DIAGNOSIS — K7031 Alcoholic cirrhosis of liver with ascites: Secondary | ICD-10-CM | POA: Diagnosis not present

## 2018-07-01 DIAGNOSIS — Z0189 Encounter for other specified special examinations: Secondary | ICD-10-CM | POA: Diagnosis not present

## 2018-07-01 DIAGNOSIS — I482 Chronic atrial fibrillation: Secondary | ICD-10-CM | POA: Diagnosis not present

## 2018-07-10 DIAGNOSIS — D485 Neoplasm of uncertain behavior of skin: Secondary | ICD-10-CM | POA: Diagnosis not present

## 2018-07-10 DIAGNOSIS — L218 Other seborrheic dermatitis: Secondary | ICD-10-CM | POA: Diagnosis not present

## 2018-07-10 DIAGNOSIS — C44319 Basal cell carcinoma of skin of other parts of face: Secondary | ICD-10-CM | POA: Diagnosis not present

## 2018-07-10 DIAGNOSIS — L570 Actinic keratosis: Secondary | ICD-10-CM | POA: Diagnosis not present

## 2018-07-10 DIAGNOSIS — Z85828 Personal history of other malignant neoplasm of skin: Secondary | ICD-10-CM | POA: Diagnosis not present

## 2018-07-29 ENCOUNTER — Other Ambulatory Visit: Payer: Self-pay

## 2018-07-29 ENCOUNTER — Encounter (HOSPITAL_COMMUNITY): Payer: Self-pay

## 2018-07-29 ENCOUNTER — Inpatient Hospital Stay (HOSPITAL_COMMUNITY)
Admission: EM | Admit: 2018-07-29 | Discharge: 2018-08-10 | DRG: 853 | Disposition: A | Discharge status: Home / Self Care | Payer: Medicare Other | Attending: Internal Medicine | Admitting: Internal Medicine

## 2018-07-29 ENCOUNTER — Emergency Department (HOSPITAL_COMMUNITY): Payer: Medicare Other

## 2018-07-29 DIAGNOSIS — J9601 Acute respiratory failure with hypoxia: Secondary | ICD-10-CM | POA: Diagnosis present

## 2018-07-29 DIAGNOSIS — E7800 Pure hypercholesterolemia, unspecified: Secondary | ICD-10-CM | POA: Diagnosis present

## 2018-07-29 DIAGNOSIS — R945 Abnormal results of liver function studies: Secondary | ICD-10-CM | POA: Diagnosis not present

## 2018-07-29 DIAGNOSIS — J189 Pneumonia, unspecified organism: Secondary | ICD-10-CM | POA: Diagnosis present

## 2018-07-29 DIAGNOSIS — R1010 Upper abdominal pain, unspecified: Secondary | ICD-10-CM | POA: Diagnosis not present

## 2018-07-29 DIAGNOSIS — R0902 Hypoxemia: Secondary | ICD-10-CM | POA: Diagnosis not present

## 2018-07-29 DIAGNOSIS — R0600 Dyspnea, unspecified: Secondary | ICD-10-CM

## 2018-07-29 DIAGNOSIS — Z419 Encounter for procedure for purposes other than remedying health state, unspecified: Secondary | ICD-10-CM

## 2018-07-29 DIAGNOSIS — J90 Pleural effusion, not elsewhere classified: Secondary | ICD-10-CM | POA: Diagnosis not present

## 2018-07-29 DIAGNOSIS — B179 Acute viral hepatitis, unspecified: Secondary | ICD-10-CM | POA: Diagnosis present

## 2018-07-29 DIAGNOSIS — I340 Nonrheumatic mitral (valve) insufficiency: Secondary | ICD-10-CM | POA: Diagnosis not present

## 2018-07-29 DIAGNOSIS — R6521 Severe sepsis with septic shock: Secondary | ICD-10-CM | POA: Diagnosis not present

## 2018-07-29 DIAGNOSIS — A419 Sepsis, unspecified organism: Secondary | ICD-10-CM | POA: Diagnosis not present

## 2018-07-29 DIAGNOSIS — R000 Tachycardia, unspecified: Secondary | ICD-10-CM | POA: Diagnosis not present

## 2018-07-29 DIAGNOSIS — K7689 Other specified diseases of liver: Secondary | ICD-10-CM | POA: Diagnosis not present

## 2018-07-29 DIAGNOSIS — K7460 Unspecified cirrhosis of liver: Secondary | ICD-10-CM | POA: Diagnosis not present

## 2018-07-29 DIAGNOSIS — Z7901 Long term (current) use of anticoagulants: Secondary | ICD-10-CM

## 2018-07-29 DIAGNOSIS — Z8042 Family history of malignant neoplasm of prostate: Secondary | ICD-10-CM

## 2018-07-29 DIAGNOSIS — K812 Acute cholecystitis with chronic cholecystitis: Secondary | ICD-10-CM | POA: Diagnosis present

## 2018-07-29 DIAGNOSIS — I471 Supraventricular tachycardia: Secondary | ICD-10-CM | POA: Diagnosis not present

## 2018-07-29 DIAGNOSIS — I959 Hypotension, unspecified: Secondary | ICD-10-CM

## 2018-07-29 DIAGNOSIS — Z8546 Personal history of malignant neoplasm of prostate: Secondary | ICD-10-CM

## 2018-07-29 DIAGNOSIS — K7031 Alcoholic cirrhosis of liver with ascites: Secondary | ICD-10-CM

## 2018-07-29 DIAGNOSIS — M19011 Primary osteoarthritis, right shoulder: Secondary | ICD-10-CM | POA: Diagnosis present

## 2018-07-29 DIAGNOSIS — N179 Acute kidney failure, unspecified: Secondary | ICD-10-CM | POA: Diagnosis present

## 2018-07-29 DIAGNOSIS — R748 Abnormal levels of other serum enzymes: Secondary | ICD-10-CM

## 2018-07-29 DIAGNOSIS — I2510 Atherosclerotic heart disease of native coronary artery without angina pectoris: Secondary | ICD-10-CM | POA: Diagnosis present

## 2018-07-29 DIAGNOSIS — R079 Chest pain, unspecified: Secondary | ICD-10-CM

## 2018-07-29 DIAGNOSIS — Z8673 Personal history of transient ischemic attack (TIA), and cerebral infarction without residual deficits: Secondary | ICD-10-CM

## 2018-07-29 DIAGNOSIS — Z801 Family history of malignant neoplasm of trachea, bronchus and lung: Secondary | ICD-10-CM

## 2018-07-29 DIAGNOSIS — I481 Persistent atrial fibrillation: Secondary | ICD-10-CM | POA: Diagnosis not present

## 2018-07-29 DIAGNOSIS — R7989 Other specified abnormal findings of blood chemistry: Secondary | ICD-10-CM

## 2018-07-29 DIAGNOSIS — I5032 Chronic diastolic (congestive) heart failure: Secondary | ICD-10-CM | POA: Diagnosis not present

## 2018-07-29 DIAGNOSIS — R7881 Bacteremia: Secondary | ICD-10-CM | POA: Diagnosis not present

## 2018-07-29 DIAGNOSIS — R0989 Other specified symptoms and signs involving the circulatory and respiratory systems: Secondary | ICD-10-CM

## 2018-07-29 DIAGNOSIS — D696 Thrombocytopenia, unspecified: Secondary | ICD-10-CM | POA: Diagnosis present

## 2018-07-29 DIAGNOSIS — I110 Hypertensive heart disease with heart failure: Secondary | ICD-10-CM | POA: Diagnosis present

## 2018-07-29 DIAGNOSIS — K660 Peritoneal adhesions (postprocedural) (postinfection): Secondary | ICD-10-CM | POA: Diagnosis present

## 2018-07-29 DIAGNOSIS — K7030 Alcoholic cirrhosis of liver without ascites: Secondary | ICD-10-CM

## 2018-07-29 DIAGNOSIS — Z8701 Personal history of pneumonia (recurrent): Secondary | ICD-10-CM | POA: Diagnosis not present

## 2018-07-29 DIAGNOSIS — Z7951 Long term (current) use of inhaled steroids: Secondary | ICD-10-CM

## 2018-07-29 DIAGNOSIS — R197 Diarrhea, unspecified: Secondary | ICD-10-CM | POA: Diagnosis not present

## 2018-07-29 DIAGNOSIS — R140 Abdominal distension (gaseous): Secondary | ICD-10-CM | POA: Diagnosis not present

## 2018-07-29 DIAGNOSIS — R188 Other ascites: Secondary | ICD-10-CM | POA: Diagnosis not present

## 2018-07-29 DIAGNOSIS — E872 Acidosis: Secondary | ICD-10-CM | POA: Diagnosis not present

## 2018-07-29 DIAGNOSIS — R1011 Right upper quadrant pain: Secondary | ICD-10-CM | POA: Diagnosis not present

## 2018-07-29 DIAGNOSIS — K652 Spontaneous bacterial peritonitis: Secondary | ICD-10-CM | POA: Diagnosis not present

## 2018-07-29 DIAGNOSIS — M109 Gout, unspecified: Secondary | ICD-10-CM | POA: Diagnosis present

## 2018-07-29 DIAGNOSIS — Z9842 Cataract extraction status, left eye: Secondary | ICD-10-CM

## 2018-07-29 DIAGNOSIS — Z87891 Personal history of nicotine dependence: Secondary | ICD-10-CM

## 2018-07-29 DIAGNOSIS — R110 Nausea: Secondary | ICD-10-CM | POA: Diagnosis not present

## 2018-07-29 DIAGNOSIS — J9811 Atelectasis: Secondary | ICD-10-CM | POA: Diagnosis not present

## 2018-07-29 DIAGNOSIS — K810 Acute cholecystitis: Secondary | ICD-10-CM | POA: Diagnosis not present

## 2018-07-29 DIAGNOSIS — R109 Unspecified abdominal pain: Secondary | ICD-10-CM

## 2018-07-29 DIAGNOSIS — T501X5A Adverse effect of loop [high-ceiling] diuretics, initial encounter: Secondary | ICD-10-CM | POA: Diagnosis not present

## 2018-07-29 DIAGNOSIS — K5900 Constipation, unspecified: Secondary | ICD-10-CM | POA: Diagnosis not present

## 2018-07-29 DIAGNOSIS — Z66 Do not resuscitate: Secondary | ICD-10-CM | POA: Diagnosis present

## 2018-07-29 DIAGNOSIS — E785 Hyperlipidemia, unspecified: Secondary | ICD-10-CM | POA: Diagnosis present

## 2018-07-29 DIAGNOSIS — A4151 Sepsis due to Escherichia coli [E. coli]: Principal | ICD-10-CM | POA: Diagnosis present

## 2018-07-29 DIAGNOSIS — K828 Other specified diseases of gallbladder: Secondary | ICD-10-CM | POA: Diagnosis not present

## 2018-07-29 DIAGNOSIS — M7989 Other specified soft tissue disorders: Secondary | ICD-10-CM | POA: Diagnosis not present

## 2018-07-29 DIAGNOSIS — R0602 Shortness of breath: Secondary | ICD-10-CM | POA: Diagnosis not present

## 2018-07-29 DIAGNOSIS — K8000 Calculus of gallbladder with acute cholecystitis without obstruction: Secondary | ICD-10-CM | POA: Diagnosis not present

## 2018-07-29 DIAGNOSIS — R112 Nausea with vomiting, unspecified: Secondary | ICD-10-CM | POA: Diagnosis not present

## 2018-07-29 DIAGNOSIS — I951 Orthostatic hypotension: Secondary | ICD-10-CM | POA: Diagnosis not present

## 2018-07-29 DIAGNOSIS — E876 Hypokalemia: Secondary | ICD-10-CM | POA: Diagnosis not present

## 2018-07-29 DIAGNOSIS — R531 Weakness: Secondary | ICD-10-CM | POA: Diagnosis not present

## 2018-07-29 DIAGNOSIS — I4891 Unspecified atrial fibrillation: Secondary | ICD-10-CM | POA: Diagnosis not present

## 2018-07-29 LAB — CBC WITH DIFFERENTIAL/PLATELET
Basophils Absolute: 0 10*3/uL (ref 0.0–0.1)
Basophils Relative: 0 %
Eosinophils Absolute: 0 10*3/uL (ref 0.0–0.7)
Eosinophils Relative: 0 %
HEMATOCRIT: 44.6 % (ref 39.0–52.0)
HEMOGLOBIN: 15.9 g/dL (ref 13.0–17.0)
LYMPHS ABS: 0.5 10*3/uL — AB (ref 0.7–4.0)
Lymphocytes Relative: 2 %
MCH: 32.8 pg (ref 26.0–34.0)
MCHC: 35.7 g/dL (ref 30.0–36.0)
MCV: 92 fL (ref 78.0–100.0)
MONOS PCT: 6 %
Monocytes Absolute: 1.7 10*3/uL — ABNORMAL HIGH (ref 0.1–1.0)
NEUTROS ABS: 26.6 10*3/uL — AB (ref 1.7–7.7)
Neutrophils Relative %: 92 %
Platelets: 152 10*3/uL (ref 150–400)
RBC: 4.85 MIL/uL (ref 4.22–5.81)
RDW: 14.3 % (ref 11.5–15.5)
WBC: 28.8 10*3/uL — AB (ref 4.0–10.5)

## 2018-07-29 LAB — URINALYSIS, ROUTINE W REFLEX MICROSCOPIC
Glucose, UA: NEGATIVE mg/dL
Hgb urine dipstick: NEGATIVE
Ketones, ur: 5 mg/dL — AB
LEUKOCYTES UA: NEGATIVE
Nitrite: NEGATIVE
PH: 5 (ref 5.0–8.0)
Protein, ur: NEGATIVE mg/dL
SPECIFIC GRAVITY, URINE: 1.019 (ref 1.005–1.030)

## 2018-07-29 LAB — LACTIC ACID, PLASMA
Lactic Acid, Venous: 4.5 mmol/L (ref 0.5–1.9)
Lactic Acid, Venous: 3.6 mmol/L (ref 0.5–1.9)

## 2018-07-29 LAB — COMPREHENSIVE METABOLIC PANEL
ALT: 54 U/L — ABNORMAL HIGH (ref 0–44)
ANION GAP: 16 — AB (ref 5–15)
AST: 97 U/L — ABNORMAL HIGH (ref 15–41)
Albumin: 3.3 g/dL — ABNORMAL LOW (ref 3.5–5.0)
Alkaline Phosphatase: 172 U/L — ABNORMAL HIGH (ref 38–126)
BUN: 29 mg/dL — ABNORMAL HIGH (ref 8–23)
CHLORIDE: 100 mmol/L (ref 98–111)
CO2: 21 mmol/L — ABNORMAL LOW (ref 22–32)
Calcium: 9 mg/dL (ref 8.9–10.3)
Creatinine, Ser: 1.65 mg/dL — ABNORMAL HIGH (ref 0.61–1.24)
GFR calc non Af Amer: 38 mL/min — ABNORMAL LOW (ref >60)
GFR, EST AFRICAN AMERICAN: 44 mL/min — AB (ref >60)
Glucose, Bld: 144 mg/dL — ABNORMAL HIGH (ref 70–99)
POTASSIUM: 3.6 mmol/L (ref 3.5–5.1)
Sodium: 137 mmol/L (ref 135–145)
Total Bilirubin: 5.3 mg/dL — ABNORMAL HIGH (ref 0.3–1.2)
Total Protein: 6.2 g/dL — ABNORMAL LOW (ref 6.5–8.1)

## 2018-07-29 LAB — I-STAT TROPONIN, ED: TROPONIN I, POC: 0.01 ng/mL (ref 0.00–0.08)

## 2018-07-29 LAB — MAGNESIUM: MAGNESIUM: 1.3 mg/dL — AB (ref 1.7–2.4)

## 2018-07-29 LAB — INFLUENZA PANEL BY PCR (TYPE A & B)
Influenza A By PCR: NEGATIVE
Influenza B By PCR: NEGATIVE

## 2018-07-29 LAB — PROTIME-INR
INR: 1.64
Prothrombin Time: 19.2 seconds — ABNORMAL HIGH (ref 11.4–15.2)

## 2018-07-29 LAB — BRAIN NATRIURETIC PEPTIDE: B NATRIURETIC PEPTIDE 5: 438.6 pg/mL — AB (ref 0.0–100.0)

## 2018-07-29 LAB — TROPONIN I: TROPONIN I: 0.04 ng/mL — AB (ref <0.03)

## 2018-07-29 LAB — PROCALCITONIN: Procalcitonin: 59.06 ng/mL

## 2018-07-29 LAB — LIPASE, BLOOD: LIPASE: 28 U/L (ref 11–51)

## 2018-07-29 LAB — MRSA PCR SCREENING: MRSA by PCR: NEGATIVE

## 2018-07-29 MED ORDER — APIXABAN 5 MG PO TABS
5.0000 mg | ORAL_TABLET | Freq: Two times a day (BID) | ORAL | Status: DC
  Administered 2018-07-29 – 2018-08-05 (×14): 5 mg via ORAL
  Filled 2018-07-29 (×15): qty 1

## 2018-07-29 MED ORDER — FLUTICASONE PROPIONATE 50 MCG/ACT NA SUSP
2.0000 | Freq: Every day | NASAL | Status: DC
  Administered 2018-08-01 – 2018-08-09 (×8): 2 via NASAL
  Filled 2018-07-29: qty 16

## 2018-07-29 MED ORDER — SODIUM CHLORIDE 0.9 % IV BOLUS
500.0000 mL | Freq: Once | INTRAVENOUS | Status: AC
  Administered 2018-07-29: 500 mL via INTRAVENOUS

## 2018-07-29 MED ORDER — METOPROLOL TARTRATE 25 MG PO TABS: 25.0000 mg | ORAL_TABLET | Freq: Two times a day (BID) | ORAL | Status: DC

## 2018-07-29 MED ORDER — SODIUM CHLORIDE 0.9% FLUSH
3.0000 mL | Freq: Two times a day (BID) | INTRAVENOUS | Status: DC
  Administered 2018-07-29 – 2018-08-10 (×23): 3 mL via INTRAVENOUS

## 2018-07-29 MED ORDER — VANCOMYCIN HCL 10 G IV SOLR
1500.0000 mg | INTRAVENOUS | Status: AC
  Administered 2018-07-29: 1500 mg via INTRAVENOUS
  Filled 2018-07-29: qty 1500

## 2018-07-29 MED ORDER — SODIUM CHLORIDE 0.9 % IV BOLUS
1000.0000 mL | Freq: Once | INTRAVENOUS | Status: AC
  Administered 2018-07-29: 1000 mL via INTRAVENOUS

## 2018-07-29 MED ORDER — ALLOPURINOL 100 MG PO TABS
200.0000 mg | ORAL_TABLET | Freq: Every day | ORAL | Status: DC
  Administered 2018-07-29 – 2018-08-08 (×11): 200 mg via ORAL
  Filled 2018-07-29 (×11): qty 2

## 2018-07-29 MED ORDER — PROMETHAZINE HCL 25 MG PO TABS
12.5000 mg | ORAL_TABLET | Freq: Four times a day (QID) | ORAL | Status: DC | PRN
  Administered 2018-07-31: 12.5 mg via ORAL
  Filled 2018-07-29: qty 1

## 2018-07-29 MED ORDER — VANCOMYCIN HCL 10 G IV SOLR: 1500.0000 mg | INTRAVENOUS | Status: DC

## 2018-07-29 MED ORDER — PIPERACILLIN-TAZOBACTAM 3.375 G IVPB 30 MIN
3.3750 g | Freq: Once | INTRAVENOUS | Status: AC
  Administered 2018-07-29: 3.375 g via INTRAVENOUS
  Filled 2018-07-29: qty 50

## 2018-07-29 MED ORDER — SODIUM CHLORIDE 0.9 % IV SOLN
INTRAVENOUS | Status: DC
  Administered 2018-07-29 – 2018-07-31 (×4): via INTRAVENOUS

## 2018-07-29 MED ORDER — PIPERACILLIN-TAZOBACTAM 3.375 G IVPB
3.3750 g | Freq: Three times a day (TID) | INTRAVENOUS | Status: DC
  Administered 2018-07-29 – 2018-07-30 (×2): 3.375 g via INTRAVENOUS
  Filled 2018-07-29 (×2): qty 50

## 2018-07-29 MED ORDER — MAGNESIUM SULFATE 2 GM/50ML IV SOLN
2.0000 g | Freq: Once | INTRAVENOUS | Status: AC
  Administered 2018-07-29: 2 g via INTRAVENOUS
  Filled 2018-07-29: qty 50

## 2018-07-29 NOTE — ED Notes (Signed)
Date and time results received: 07/29/18 10:19 AM  (use smartphrase ".now" to insert current time)  Test: Lactic Critical Value: 4.5  Name of Provider Notified: Dr. Francia Greaves

## 2018-07-29 NOTE — ED Notes (Signed)
Not able to weigh pt at the time. Pt's BP is low and pt is very weak.

## 2018-07-29 NOTE — ED Notes (Signed)
Condom Cath has been placed. Pt has been made aware that MD will need UA sample

## 2018-07-29 NOTE — ED Notes (Signed)
ED TO INPATIENT HANDOFF REPORT  Name/Age/Gender Samuel Flores 77 y.o. male  Code Status Code Status History    Date Active Date Inactive Code Status Order ID Comments User Context   09/26/2017 0504 10/06/2017 1813 Full Code [id number]  Etta Quill, DO ED   01/02/2013 0849 01/02/2013 1336 Full Code [id number]  Laverda Page, MD Inpatient      Home/SNF/Other Home  Chief Complaint abd pain  Level of Care/Admitting Diagnosis ED Disposition    ED Disposition Condition [facility]: [facility] [[id number]]  Level of Care: Stepdown [14]  Admit to SDU based on following criteria: Hemodynamic compromise or significant risk of instability:  Patient requiring short term acute titration and management of vasoactive drips, and invasive monitoring (i.e., CVP and Arterial line).  Admit to SDU based on following criteria: Cardiac Instability:  Patients experiencing chest pain, unconfirmed MI and stable, arrhythmias and CHF requiring medical management and potentially compromising patient's stability  Diagnosis: Sepsis [facility]) [[id number]]  Admitting Physician: Nita Sells [phone]  Attending Physician: Nita Sells [phone]  Estimated length of stay: 3 - 4 days  Certification:: I certify this patient will need inpatient services for at least 2 midnights  PT Class (Do Not Modify): Inpatient [101]  PT Acc Code (Do Not Modify): Private [1]       Medical History Past Medical History:  Diagnosis Date  . Alcohol abuse, daily use 08/18/2013  . Arthritis    osteoarthritis  . CAD (coronary artery disease)    Non obstructive  . Cancer [facility]) 1994   prostate  . Diastolic dysfunction Echo 2012   Grade 1  . Gout   . 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 Hx-TIA (transient ischemic attack) 2011  . Hypercholesteremia   . Hypertension     Allergies No Known Allergies  IV Location/Drains/Wounds Patient Lines/Drains/Airways Status   Active  Line/Drains/Airways    Name:   Placement date:   Placement time:   Site:   Days:   Peripheral IV 09/26/17 Right Hand   09/26/17    0513    Hand   306   Peripheral IV 09/26/17 Right;Anterior Forearm   09/26/17    2232    Forearm   306   Peripheral IV 07/29/18 Right Forearm   07/29/18    0922    Forearm   less than 1   Peripheral IV 07/29/18 Left Antecubital   07/29/18    0927    Antecubital   less than 1   External Urinary Catheter   10/04/17    2257    -   298   Pressure Injury 10/03/17 Stage I -  Intact skin with non-blanchable redness of a localized area usually over a bony prominence.   10/03/17    1847     299          Labs/Imaging Results for orders placed or performed during the hospital encounter of 07/29/18 (from the past 48 hour(s))  CBC with Differential     Status: Abnormal   Collection Time: 07/29/18  9:04 AM  Result Value Ref Range   WBC 28.8 (H) 4.0 - 10.5 K/uL   RBC 4.85 4.22 - 5.81 MIL/uL   Hemoglobin 15.9 13.0 - 17.0 g/dL   HCT 44.6 39.0 - 52.0 %   MCV 92.0 78.0 - 100.0 fL   MCH 32.8 26.0 - 34.0 pg   MCHC 35.7 30.0 - 36.0 g/dL   RDW  14.3 11.5 - 15.5 %   Platelets 152 150 - 400 K/uL   Neutrophils Relative % 92 %   Neutro Abs 26.6 (H) 1.7 - 7.7 K/uL   Lymphocytes Relative 2 %   Lymphs Abs 0.5 (L) 0.7 - 4.0 K/uL   Monocytes Relative 6 %   Monocytes Absolute 1.7 (H) 0.1 - 1.0 K/uL   Eosinophils Relative 0 %   Eosinophils Absolute 0.0 0.0 - 0.7 K/uL   Basophils Relative 0 %   Basophils Absolute 0.0 0.0 - 0.1 K/uL   WBC Morphology MILD LEFT SHIFT (1-5% METAS, OCC MYELO, OCC BANDS)     Comment: VACUOLATED NEUTROPHILS Performed at [place] [place] [address] [place], [place] [address]   Protime-INR     Status: Abnormal   Collection Time: 07/29/18  9:04 AM  Result Value Ref Range   Prothrombin Time 19.2 (H) 11.4 - 15.2 seconds   INR 1.64     Comment: Performed at [facility], [place] [address], [place], [place] [address]   Troponin I     Status: Abnormal   Collection Time: 07/29/18  9:04 AM  Result Value Ref Range   Troponin I 0.04 (HH) <0.03 ng/mL    Comment: CRITICAL RESULT CALLED TO, READ BACK BY AND VERIFIED WITHTonie Griffith RN AT 1004 07/29/18 MULLINS,T Performed at [facility], [place] [address] [place], [place] [address]   Brain natriuretic peptide     Status: Abnormal   Collection Time: 07/29/18  9:04 AM  Result Value Ref Range   B Natriuretic Peptide 438.6 (H) 0.0 - 100.0 pg/mL    Comment: Performed at [facility], [place] [address] [place], [place] [address]  Comprehensive metabolic panel     Status: Abnormal   Collection Time: 07/29/18  9:04 AM  Result Value Ref Range   Sodium 137 135 - 145 mmol/L   Potassium 3.6 3.5 - 5.1 mmol/L   Chloride 100 98 - 111 mmol/L   CO2 21 (L) 22 - 32 mmol/L   Glucose, Bld 144 (H) 70 - 99 mg/dL   BUN 29 (H) 8 - 23 mg/dL   Creatinine, Ser 1.65 (H) 0.61 - 1.24 mg/dL   Calcium 9.0 8.9 - 10.3 mg/dL   Total Protein 6.2 (L) 6.5 - 8.1 g/dL   Albumin 3.3 (L) 3.5 - 5.0 g/dL   AST 97 (H) 15 - 41 U/L   ALT 54 (H) 0 - 44 U/L   Alkaline Phosphatase 172 (H) 38 - 126 U/L   Total Bilirubin 5.3 (H) 0.3 - 1.2 mg/dL   GFR calc non Af Amer 38 (L) >60 mL/min   GFR calc Af Amer 44 (L) >60 mL/min    Comment: (NOTE) The eGFR has been calculated using the CKD EPI equation. This calculation has not been validated in all clinical situations. eGFR's persistently <60 mL/min signify possible Chronic Kidney Disease.    Anion gap 16 (H) 5 - 15    Comment: Performed at [facility], [place] [address] [place], [place] [address]  Lipase, blood     Status: None   Collection Time: 07/29/18  9:04 AM  Result Value Ref Range   Lipase 28 11 - 51 U/L    Comment: Performed at [facility], [place] [address], [place], [place] [address]  I-stat troponin, ED     Status: None   Collection Time: 07/29/18  9:10 AM  Result  Value Ref Range  Troponin i, poc 0.01 0.00 - 0.08 ng/mL   Comment 3            Comment: Due to the release kinetics of cTnI, a negative result within the first hours of the onset of symptoms does not rule out myocardial infarction with certainty. If myocardial infarction is still suspected, repeat the test at appropriate intervals.   Lactic acid, plasma     Status: Abnormal   Collection Time: 07/29/18  9:28 AM  Result Value Ref Range   Lactic Acid, Venous 4.5 (HH) 0.5 - 1.9 mmol/L    Comment: CRITICAL RESULT CALLED TO, READ BACK BY AND VERIFIED WITHTonie Griffith RN AT 9470 07/29/18 MULLINS,T Performed at [facility], [place] [address] [place], [place] [address]   Lactic acid, plasma     Status: Abnormal   Collection Time: 07/29/18 11:50 AM  Result Value Ref Range   Lactic Acid, Venous 3.6 (HH) 0.5 - 1.9 mmol/L    Comment: CRITICAL RESULT CALLED TO, READ BACK BY AND VERIFIED WITHMarjie Skiff RN AT 1252 07/29/18 MULLINS,T Performed at [facility], [place] [address] [place], [place] [address]   Procalcitonin     Status: None   Collection Time: 07/29/18 11:50 AM  Result Value Ref Range   Procalcitonin 59.06 ng/mL    Comment:        Interpretation: PCT >= 10 ng/mL: Important systemic inflammatory response, almost exclusively due to severe bacterial sepsis or septic shock. (NOTE)       Sepsis PCT Algorithm           Lower Respiratory Tract                                      Infection PCT Algorithm    ----------------------------     ----------------------------         PCT < 0.25 ng/mL                PCT < 0.10 ng/mL         Strongly encourage             Strongly discourage   discontinuation of antibiotics    initiation of antibiotics    ----------------------------     -----------------------------       PCT 0.25 - 0.50 ng/mL            PCT 0.10 - 0.25 ng/mL               OR       >80% decrease in PCT            Discourage initiation  of                                            antibiotics      Encourage discontinuation           of antibiotics    ----------------------------     -----------------------------         PCT >= 0.50 ng/mL              PCT 0.26 - 0.50 ng/mL                AND       <80% decrease in PCT  Encourage initiation of                                             antibiotics       Encourage continuation           of antibiotics    ----------------------------     -----------------------------        PCT >= 0.50 ng/mL                  PCT > 0.50 ng/mL               AND         increase in PCT                  Strongly encourage                                      initiation of antibiotics    Strongly encourage escalation           of antibiotics                                     -----------------------------                                           PCT <= 0.25 ng/mL                                                 OR                                        > 80% decrease in PCT                                     Discontinue / Do not initiate                                             antibiotics Performed at [facility] [address], [place], [place] [address]   Urinalysis, Routine w reflex microscopic     Status: Abnormal   Collection Time: 07/29/18  1:28 PM  Result Value Ref Range   Color, Urine AMBER (A) YELLOW    Comment: BIOCHEMICALS MAY BE AFFECTED BY COLOR   APPearance HAZY (A) CLEAR   Specific Gravity, Urine 1.019 1.005 - 1.030   pH 5.0 5.0 - 8.0   Glucose, UA NEGATIVE NEGATIVE mg/dL   Hgb urine dipstick NEGATIVE NEGATIVE   Bilirubin Urine SMALL (A) NEGATIVE   Ketones, ur 5 (A) NEGATIVE mg/dL   Protein, ur NEGATIVE NEGATIVE mg/dL   Nitrite NEGATIVE NEGATIVE   Leukocytes, UA NEGATIVE NEGATIVE  Comment: Performed at [facility], [place] [address] [place], [place] [address]   Dg Chest 1 View  Result Date:  07/29/2018 CLINICAL DATA:  Shortness of breath, hypertension EXAM: CHEST  1 VIEW COMPARISON:  09/30/2017 FINDINGS: Low volumes with bibasilar atelectasis. Heart is borderline in size. No effusions or acute bony abnormality. IMPRESSION: Low lung volumes with bibasilar atelectasis. Electronically Signed   By: Rolm Baptise M.D.   On: 07/29/2018 09:19    Pending Labs Unresulted Labs (From admission, onward)    Start     Ordered   07/29/18 1250  Influenza panel by PCR (type A & B)  (Influenza PCR Panel)  Once,   R     07/29/18 1249   07/29/18 0857  Culture, blood (routine x 2)  BLOOD CULTURE X 2,   STAT     07/29/18 0856   Signed and Held  Hemoglobin A1c  Once,   R     Signed and Held   Signed and Held  Comprehensive metabolic panel  Tomorrow morning,   R     Signed and Held   Signed and Held  CBC WITH DIFFERENTIAL  Tomorrow morning,   R     Signed and Held   Signed and Held  Culture, sputum-assessment  Once,   R     Signed and Held   Signed and Held  Magnesium  Once,   R     Signed and Held          Vitals/Pain Today's Vitals   07/29/18 1245 07/29/18 1300 07/29/18 1330 07/29/18 1350  BP: (!) 89/54 (!) 87/59 (!) 83/50 (!) 82/54  Pulse: 94 (!) 146 94 (!) 30  Resp: (!) 22 (!) 27 (!) 28 (!) 28  Temp:      TempSrc:      SpO2: 98% 98% 98% 97%    Isolation Precautions Droplet precaution  Medications Medications  sodium chloride 0.9 % bolus 500 mL (500 mLs Intravenous New Bag/Given 07/29/18 1350)  sodium chloride 0.9 % bolus 500 mL (0 mLs Intravenous Stopped 07/29/18 1058)  piperacillin-tazobactam (ZOSYN) IVPB 3.375 g (0 g Intravenous Stopped 07/29/18 1018)  vancomycin (VANCOCIN) 1,500 mg in sodium chloride 0.9 % 500 mL IVPB (0 mg Intravenous Stopped 07/29/18 1242)  sodium chloride 0.9 % bolus 500 mL (0 mLs Intravenous Stopped 07/29/18 1131)  sodium chloride 0.9 % bolus 500 mL (0 mLs Intravenous Stopped 07/29/18 1328)    Mobility

## 2018-07-29 NOTE — H&P (Addendum)
HPI  Samuel Flores QJF:[id number] DOB: Apr 11, 1941 DOA: 07/29/2018  PCP: Alycia Rossetti, MD  Dr. [name] is her cardiologist   Chief Complaint: Feeling weak  HPI:  77 year old male reformed ethanol user November since admission for severe sepsis with bacteremia Atrial fibrillation chads score >3 apixaban Diastolic heart failure followed in the outpatient last echocardiogram-19/18 showed EF 50-55% and LV function was not able to be read properly PA peak pressure was 32 Prior smoker 5 years ago Gout prostate cancer status post resection in the 90s Dr. Gaynelle Arabian  Wife was ill up to a week ago-had congestive symptoms and was treated with varied antibiotics-unclear if flu test was performed-states patient was doing fine and has been doing fine but yesterday evening developed some coughing progressing to posttussive vomitus of no bloody material mucoid-some mild abdominal pain epigastric-resolved overnight after admission 9/16 Feeling weak-unable to ambulate and feeling dizzy-no chest pain currently-fever noted at home-no rash no blurred vision no double vision no diarrhea other than last night symptoms which seem to have been associated with 1-2 episodes of this Slept very poorly last p.m.    Patient ambulates down his driveway 893 feet but feels winded-this is been going on antecedent to admission last year   Patient given 3 boluses of 1500 cc total started as below  ED Course: Early goal-directed sepsis Rx started procalcitonin 59 lactic acid 4.5 WBC 28 BNP 438 troponin 0 0.04 BUN/creatinine 29/1.6 alk phos 172 AST/ALT up from baseline to 97/54 CO2 20    Review of Systems:   In addition to above feels overall weak but no unilateral weakness no focal deficits   Past Medical History:  Diagnosis Date  . Alcohol abuse, daily use 08/18/2013  . Arthritis    osteoarthritis  . CAD (coronary artery disease)    Non obstructive  . Cancer [facility]) 1994   prostate  . Diastolic  dysfunction Echo 2012   Grade 1  . Gout   . 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 Hx-TIA (transient ischemic attack) 2011  . Hypercholesteremia   . Hypertension     Past Surgical History:  Procedure Laterality Date  . BURR HOLE FOR SUBDURAL HEMATOMA  03/2011  . CARDIOVERSION N/A 02/20/2017   Procedure: CARDIOVERSION;  Surgeon: Adrian Prows, MD;  Location: [place];  Service: Cardiovascular;  Laterality: N/A;  . EYE SURGERY Left 03/2009   cataract  . LEFT HEART CATHETERIZATION WITH CORONARY ANGIOGRAM N/A 01/02/2013   Procedure: LEFT HEART CATHETERIZATION WITH CORONARY ANGIOGRAM;  Surgeon: Laverda Page, MD;  Location: [facility] CATH LAB;  Service: Cardiovascular;  Laterality: N/A;  . PROSTATE SURGERY  1994  . SPINE SURGERY     cervical     reports that he quit smoking about 5 years ago. His smoking use included pipe. He quit after 39.00 years of use. He has never used smokeless tobacco. He reports that he drinks about 56.0 standard drinks of alcohol per week. He reports that he does not use drugs. Mobility: As above in my HPI  No Known Allergies None Family History  Problem Relation Age of Onset  . Lung cancer Mother        never smoker  . Prostate cancer Father      Prior to Admission medications   Medication Sig Start Date End Date Taking? Authorizing Provider  allopurinol (ZYLOPRIM) 100 MG tablet TAKE 2 TABLETS BY MOUTH EVERY DAY 03/25/18  Yes [place], Modena Nunnery, MD  apixaban (ELIQUIS) 5 MG TABS tablet Take  1 tablet (5 mg total) by mouth 2 (two) times daily. 01/02/18  Yes [place], Modena Nunnery, MD  diltiazem (CARDIZEM CD) 180 MG 24 hr capsule Take 180 mg by mouth daily. 05/15/18  Yes [provider]  fluticasone (FLONASE) 50 MCG/ACT nasal spray Place 2 sprays into both nostrils daily. Patient taking differently: Place 2 sprays into both nostrils daily as needed for allergies.  10/07/17  Yes Patrecia Pour, Christean Grief, MD  folic acid (FOLVITE) 1 MG tablet Take 1 tablet (1 mg total) by  mouth daily. 10/07/17  Yes Patrecia Pour, Christean Grief, MD  metoprolol tartrate (LOPRESSOR) 100 MG tablet Take 100 mg by mouth 2 (two) times daily.   Yes [provider]  spironolactone (ALDACTONE) 50 MG tablet Take 50 mg by mouth daily.   Yes [provider]  torsemide (DEMADEX) 20 MG tablet Take 10 mg by mouth daily.    Yes [provider]  metoprolol tartrate (LOPRESSOR) 50 MG tablet Take 1 tablet (50 mg total) by mouth 2 (two) times daily. Patient not taking: Reported on 07/29/2018 10/06/17   Patrecia Pour, Christean Grief, MD    Physical Exam:  Vitals:   07/29/18 1145 07/29/18 1200  BP: (!) 80/49 (!) 85/56  Pulse: 89 89  Resp: (!) 30 (!) 26  Temp:    SpO2: 97% 98%     EOMI NCAT  Neck soft supple no lymphadenopathy, patient has no oropharyngeal lesion  Chest is relatively clear with no added rales or rhonchi there is no fremitus  Abdomen is soft no rebound no guarding  Range of motion intact bilaterally power 5/5 although patient is overall weak  Neurologically intact moving all 4 limbs  Reflexes deferred given nonfocal findings grossly  No lower extremity edema  Psych is euthymic  I have personally reviewed following labs and imaging studies  Labs:   As above  Imaging studies:   Chest x-ray shows low lung volumes with atelectasis  Medical tests:   EKG independently reviewed: A. fib with PR interval 0.08 QRS axis is -50 ST-T wave depression noted in V2 but no contiguous findings compared to EKG done before previously there is resolution of PVCs and ischemic-like findings  Test discussed with performing physician:  y   Decision to obtain old records:   y   Review and summation of old records:   y   Active Problems:   * No active hospital problems. *   Assessment/Plan severe sepsis acidosis and mild transaminitis Atrial fibrillation without RVR Nausea vomiting likely viral prodrome Elevated BNP, bump in troponin secondary to  sepsis   Severe sepsis likely source is pulmonary-x-ray does not confirm any source at this juncture so I will get a flu swab and start empiric coverage with antibiotics Vanco and Zosyn get MRSA-if no further source and GI symptoms with scan belly and chest Continue sepsis bundle and bolus IV fluids for hypotension procalcitonin is elevated in the 50s Given he is mentating fairly well and is DNR I have held on critical care consulting but I have had an extensive discussion with him and his family and I will honor his wishes for comfort if he declines  Prior heavy alcoholism-quit November 2018-transaminitis is secondary to sepsis  Elevated BNP bumped point-of-care troponin-secondary to sepsis he is not having chest pain at this time EKG looks benign no further work-up at this juncture unless needed  AKI-Baseline BUN/creatinine is 19/1.0 today is 29/1.6 with acidosis CO2 21 likely secondary to renal acidosis versus sepsis de novo-we  will hydrate copiously-he is on Demadex which has been held  Atrial fibrillation without RVR chads score >4-given hypotension which is resolving in the ED we will continue IV resuscitation attempts-he will need Xarelto if he is able to tolerate diet over the course of today I will have to hold rate controlling agents of Cardizem and metoprolol XL and if necessary and he flips into A. fib he will need Cardizem gtt. and discussion with Dr. [name] of cardiology who is his general cardiologist  -If the patient's heart rate and blood pressure are elevated tomorrow he may be able to use lower doses of metoprolol XL and I have ordered that for the morning  Possible diastolic heart failure?-Last echo was not clear for diastolic parameters-this would not change management at this juncture Aldactone as well as Demadex on hold-continue IV resuscitation at this time  Prior history of prostate cancer-operated in 1990s no further work-up-outpatient PSA and screening as per  urologist  Gout-May continue allopurinol  Severity of Illness: The appropriate patient status for this patient is INPATIENT. Inpatient status is judged to be reasonable and necessary in order to provide the required intensity of service to ensure the patient's safety. The patient's presenting symptoms, physical exam findings, and initial radiographic and laboratory data in the context of their chronic comorbidities is felt to place them at high risk for further clinical deterioration. Furthermore, it is not anticipated that the patient will be medically stable for discharge from the hospital within 2 midnights of admission. The following factors support the patient status of inpatient.   " The patient's presenting symptoms include weakness as well as fever. " The worrisome physical exam findings include overall weakness and infection. " The initial radiographic and laboratory data are worrisome because of possible viral illness. " The chronic co-morbidities include ethanolism smoking A. fib heart failure obesity.   * I certify that at the point of admission it is my clinical judgment that the patient will require inpatient hospital care spanning beyond 2 midnights from the point of admission due to high intensity of service, high risk for further deterioration and high frequency of surveillance required.*     DVT prophylaxis: Systemic anticoagulation Xarelto Code Status: DNR confirmed at the bedside Family Communication: Inpatient discussed clearly with wife Consults called: None as yet I have asked critical care to [facility] on him this evening and put him on the list to be seen if something is to happen at this juncture he is DNR and if he decompensates we would allow nature take its course  Time spent: 75 minutes  CRITICAL CARE Performed by: Nita Sells   Total critical care time: 45 minutes  Critical care time was exclusive of separately billable procedures and treating other  patients.  Critical care was necessary to treat or prevent imminent or life-threatening deterioration.  Critical care was time spent personally by me on the following activities: development of treatment plan with patient and/or surrogate as well as nursing, discussions with consultants, evaluation of patient's response to treatment, examination of patient, obtaining history from patient or surrogate, ordering and performing treatments and interventions, ordering and review of laboratory studies, ordering and review of radiographic studies, pulse oximetry and re-evaluation of patient's condition.   Verlon Au, MD  [facility] Direct contact: [phone] --Via amion app OR  --[email or web address]; password [email or web address]  7PM-7AM contact night coverage as above  07/29/2018, 12:41 PM

## 2018-07-29 NOTE — ED Notes (Signed)
Date and time results received: 07/29/18 10:06 AM (use smartphrase ".now" to insert current time)  Test: Troponin  Critical Value: 0.04  Name of Provider Notified: Dr. Francia Greaves  Orders Received? Or Actions Taken?: Orders Received - See Orders for details

## 2018-07-29 NOTE — ED Notes (Signed)
Unable to weigh patient at this time. Patients BP is too low to have patient stand up.

## 2018-07-29 NOTE — ED Provider Notes (Signed)
[facility] DEPT Provider Note   CSN: [id number] Arrival date & time: 07/29/18  0844     History   Chief Complaint Chief Complaint  Patient presents with  . Hypotension  . Nausea  . Diarrhea  . Emesis    HPI Samuel Flores is a 77 y.o. male.  77 year old male with prior medical history as detailed below presents with complaint of weakness, nausea, vomiting, and epigastric discomfort.  Symptoms started 3 to 4 days prior.  He denies fever.  He denies chest pain.  He reports that he felt particularly uncomfortable last night.  Symptoms have significantly improved upon his evaluation in the ED today.  He was noted to be hypotensive by EMS.  His last admission was in November 2018 for pneumonia.  The history is provided by the patient.  Illness  This is a new problem. The current episode started 2 days ago. The problem occurs constantly. The problem has not changed since onset.Pertinent negatives include no chest pain, no abdominal pain, no headaches and no shortness of breath. Nothing aggravates the symptoms. Nothing relieves the symptoms. He has tried nothing for the symptoms.    Past Medical History:  Diagnosis Date  . Alcohol abuse, daily use 08/18/2013  . Arthritis    osteoarthritis  . CAD (coronary artery disease)    Non obstructive  . Cancer [facility]) 1994   prostate  . Diastolic dysfunction Echo 2012   Grade 1  . Gout   . 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 Hx-TIA (transient ischemic attack) 2011  . Hypercholesteremia   . Hypertension     Patient Active Problem List   Diagnosis Date Noted  . Protein-calorie malnutrition ([place]) 11/23/2017  . CAP (community acquired pneumonia) 10/12/2017  . Cirrhosis of liver ([place]) 10/12/2017  . Pressure injury of skin 10/06/2017  . Lactic acidosis 09/26/2017  . Sepsis ([place]) 09/26/2017  . Epigastric pain 09/26/2017  . A-fib ([place]) 02/18/2017  . Chronic diarrhea 11/27/2014  . Elevated LFTs 07/27/2014   . CHF with left ventricular diastolic dysfunction, NYHA class 2 ([place]) 03/24/2014  . Hypokalemia 12/24/2013  . Peripheral edema 10/07/2013  . Ventricular bigeminy seen on cardiac monitor 07/29/13 at CP[facility] 08/27/2013  . Alcohol abuse, daily use 08/18/2013  . HBP (high blood pressure) 07/15/2013  . Pulmonary hypertension ([place]) 05/28/2013  . DOE (dyspnea on exertion) 04/28/2013  . Dizziness and giddiness 04/28/2013  . Hypercholesteremia   . Cancer ([place])   . Gout   . Hx of subdural hematoma     Past Surgical History:  Procedure Laterality Date  . BURR HOLE FOR SUBDURAL HEMATOMA  03/2011  . CARDIOVERSION N/A 02/20/2017   Procedure: CARDIOVERSION;  Surgeon: Adrian Prows, MD;  Location: [facility];  Service: Cardiovascular;  Laterality: N/A;  . EYE SURGERY Left 03/2009   cataract  . LEFT HEART CATHETERIZATION WITH CORONARY ANGIOGRAM N/A 01/02/2013   Procedure: LEFT HEART CATHETERIZATION WITH CORONARY ANGIOGRAM;  Surgeon: Laverda Page, MD;  Location: [facility] CATH LAB;  Service: Cardiovascular;  Laterality: N/A;  . PROSTATE SURGERY  1994  . SPINE SURGERY     cervical        Home Medications    Prior to Admission medications   Medication Sig Start Date End Date Taking? Authorizing Provider  allopurinol (ZYLOPRIM) 100 MG tablet TAKE 2 TABLETS BY MOUTH EVERY DAY 03/25/18  Yes [place], Modena Nunnery, MD  apixaban (ELIQUIS) 5 MG TABS tablet Take 1 tablet (5 mg total) by mouth 2 (  two) times daily. 01/02/18  Yes [place], Modena Nunnery, MD  diltiazem (CARDIZEM CD) 180 MG 24 hr capsule Take 180 mg by mouth daily. 05/15/18  Yes [provider]  fluticasone (FLONASE) 50 MCG/ACT nasal spray Place 2 sprays into both nostrils daily. Patient taking differently: Place 2 sprays into both nostrils daily as needed for allergies.  10/07/17  Yes Patrecia Pour, Christean Grief, MD  folic acid (FOLVITE) 1 MG tablet Take 1 tablet (1 mg total) by mouth daily. 10/07/17  Yes Patrecia Pour, Christean Grief, MD  metoprolol tartrate (LOPRESSOR)  100 MG tablet Take 100 mg by mouth 2 (two) times daily.   Yes [provider]  spironolactone (ALDACTONE) 50 MG tablet Take 50 mg by mouth daily.   Yes [provider]  torsemide (DEMADEX) 20 MG tablet Take 10 mg by mouth daily.    Yes [provider]  metoprolol tartrate (LOPRESSOR) 50 MG tablet Take 1 tablet (50 mg total) by mouth 2 (two) times daily. Patient not taking: Reported on 07/29/2018 10/06/17   Doreatha Lew, MD    Family History Family History  Problem Relation Age of Onset  . Lung cancer Mother        never smoker  . Prostate cancer Father     Social History Social History   Tobacco Use  . Smoking status: Former Smoker    Years: 39.00    Types: Pipe    Last attempt to quit: 04/09/2013    Years since quitting: 5.3  . Smokeless tobacco: Never Used  Substance Use Topics  . Alcohol use: Yes    Alcohol/week: 56.0 standard drinks    Types: 14 Cans of beer, 42 Shots of liquor per week  . Drug use: No     Allergies   Patient has no known allergies.   Review of Systems Review of Systems  Respiratory: Negative for shortness of breath.   Cardiovascular: Negative for chest pain.  Gastrointestinal: Negative for abdominal pain.  Neurological: Negative for headaches.  All other systems reviewed and are negative.    Physical Exam Updated Vital Signs BP (!) 90/56   Pulse 99   Temp 97.9 F (36.6 C) (Oral)   Resp (!) 27   SpO2 97%   Physical Exam  Constitutional: He is oriented to person, place, and time. He appears well-developed and well-nourished. No distress.  HENT:  Head: Normocephalic and atraumatic.  Mouth/Throat: Oropharynx is clear and moist.  Eyes: Pupils are equal, round, and reactive to light. Conjunctivae and EOM are normal.  Neck: Normal range of motion. Neck supple.  Cardiovascular: Normal rate, regular rhythm and normal heart sounds.  Pulmonary/Chest: Effort normal. No respiratory distress.  Decreased BS at  bilateral bases   Abdominal: Soft. He exhibits no distension. There is no tenderness.  Musculoskeletal: Normal range of motion. He exhibits no edema or deformity.  Neurological: He is alert and oriented to person, place, and time.  Skin: Skin is warm and dry.  Psychiatric: He has a normal mood and affect.  Nursing note and vitals reviewed.    ED Treatments / Results  Labs (all labs ordered are listed, but only abnormal results are displayed) Labs Reviewed  URINALYSIS, ROUTINE W REFLEX MICROSCOPIC - Abnormal; Notable for the following components:      Result Value   Color, Urine AMBER (*)    APPearance HAZY (*)    Bilirubin Urine SMALL (*)    Ketones, ur 5 (*)    All other components within normal limits  CBC WITH DIFFERENTIAL/PLATELET - Abnormal; Notable for the following components:   WBC 28.8 (*)    Neutro Abs 26.6 (*)    Lymphs Abs 0.5 (*)    Monocytes Absolute 1.7 (*)    All other components within normal limits  PROTIME-INR - Abnormal; Notable for the following components:   Prothrombin Time 19.2 (*)    All other components within normal limits  TROPONIN I - Abnormal; Notable for the following components:   Troponin I 0.04 (*)    All other components within normal limits  BRAIN NATRIURETIC PEPTIDE - Abnormal; Notable for the following components:   B Natriuretic Peptide 438.6 (*)    All other components within normal limits  COMPREHENSIVE METABOLIC PANEL - Abnormal; Notable for the following components:   CO2 21 (*)    Glucose, Bld 144 (*)    BUN 29 (*)    Creatinine, Ser 1.65 (*)    Total Protein 6.2 (*)    Albumin 3.3 (*)    AST 97 (*)    ALT 54 (*)    Alkaline Phosphatase 172 (*)    Total Bilirubin 5.3 (*)    GFR calc non Af Amer 38 (*)    GFR calc Af Amer 44 (*)    Anion gap 16 (*)    All other components within normal limits  LACTIC ACID, PLASMA - Abnormal; Notable for the following components:   Lactic Acid, Venous 4.5 (*)    All other components within  normal limits  LACTIC ACID, PLASMA - Abnormal; Notable for the following components:   Lactic Acid, Venous 3.6 (*)    All other components within normal limits  CULTURE, BLOOD (ROUTINE X 2)  CULTURE, BLOOD (ROUTINE X 2)  LIPASE, BLOOD  PROCALCITONIN  INFLUENZA PANEL BY PCR (TYPE A & B)  I-STAT TROPONIN, ED    EKG EKG Interpretation  Date/Time:  Monday July 29 2018 09:11:41 EDT Ventricular Rate:  106 PR Interval:    QRS Duration: 90 QT Interval:  344 QTC Calculation: 457 R Axis:   -56 Text Interpretation:  Atrial fibrillation Inferior infarct, old Consider anterior infarct Lateral leads are also involved Confirmed by Dene Gentry [phone]) on 07/29/2018 9:16:23 AM Also confirmed by Dene Gentry [phone]), editor Philomena Doheny [phone])  on 07/29/2018 11:09:41 AM   Radiology Dg Chest 1 View  Result Date: 07/29/2018 CLINICAL DATA:  Shortness of breath, hypertension EXAM: CHEST  1 VIEW COMPARISON:  09/30/2017 FINDINGS: Low volumes with bibasilar atelectasis. Heart is borderline in size. No effusions or acute bony abnormality. IMPRESSION: Low lung volumes with bibasilar atelectasis. Electronically Signed   By: Rolm Baptise M.D.   On: 07/29/2018 09:19    Procedures Procedures (including critical care time) CRITICAL CARE Performed by: Valarie Merino   Total critical care time: 45 minutes  Critical care time was exclusive of separately billable procedures and treating other patients.  Critical care was necessary to treat or prevent imminent or life-threatening deterioration.  Critical care was time spent personally by me on the following activities: development of treatment plan with patient and/or surrogate as well as nursing, discussions with consultants, evaluation of patient's response to treatment, examination of patient, obtaining history from patient or surrogate, ordering and performing treatments and interventions, ordering and review of laboratory studies, ordering and  review of radiographic studies, pulse oximetry and re-evaluation of patient's condition.   Medications Ordered in ED Medications  vancomycin (VANCOCIN) 1,500 mg in sodium chloride 0.9 % 500 mL IVPB (1,500  mg Intravenous New Bag/Given 07/29/18 0958)  sodium chloride 0.9 % bolus 500 mL (500 mLs Intravenous New Bag/Given 07/29/18 1150)  sodium chloride 0.9 % bolus 500 mL (0 mLs Intravenous Stopped 07/29/18 1058)  piperacillin-tazobactam (ZOSYN) IVPB 3.375 g (0 g Intravenous Stopped 07/29/18 1018)  sodium chloride 0.9 % bolus 500 mL (0 mLs Intravenous Stopped 07/29/18 1131)     Initial Impression / Assessment and Plan / ED Course  I have reviewed the triage vital signs and the nursing notes.  Pertinent labs & imaging results that were available during my care of the patient were reviewed by me and considered in my medical decision making (see chart for details).     MDM  Screen complete  Patient is presenting for evaluation of weakness, fatigue, and hypertension.  Patient with possible dehydration and/or early sepsis.  Patient will be admitted for further work-up and treatment.  Patient given gentle IV fluid resuscitation in the ED.  Patient given broad-spectrum antibiotics.  Patient appears to be improved following ED work-up and initial treatment.  Hospitalist service is aware of case and will evaluate for admission.  Intensivist service made aware of case - but will defer pending admission to hospitalist service unless they are called back.  Final Clinical Impressions(s) / ED Diagnoses   Final diagnoses:  Hypotension, unspecified hypotension type    ED Discharge Orders    None       Valarie Merino, MD 07/29/18 1429

## 2018-07-29 NOTE — Progress Notes (Signed)
A consult was received from an ED physician for Vancomycin per pharmacy dosing.  The patient's profile has been reviewed for ht/wt/allergies/indication/available labs.   A one time order has been placed for Vancomycin 1500mg  IV x 1 dose (using weight of 75.8kg from 03/25/2018).  Further antibiotics/pharmacy consults should be ordered by admitting physician if indicated.                       Thank you, Everette Rank, PharmD 07/29/2018  9:45 AM

## 2018-07-29 NOTE — ED Triage Notes (Signed)
Patient arrived via [facility] from home. C/o of abdominal pain 3/10. Onset 1:00am. C/o N/V/D and chills. Hypotensive. 79/47, 90/40, 82/55. A.FIb and hx. Of pneumonia last november. No known allergies, no recent med changes, patient has not taken any medications today.  713ml NS bolus given per ems.  4mg  zofran giver per ems Patient last ate last night- shrimp,corn, bakes potato

## 2018-07-29 NOTE — ED Notes (Signed)
Report given.

## 2018-07-29 NOTE — Progress Notes (Signed)
Pharmacy Antibiotic Note  Samuel Flores is a 77 y.o. male admitted on 07/29/2018 with sepsis.  Pharmacy has been consulted for piperacillin/tazobactam + vancomycin dosing.  Today, 07/29/18  WBC 28.8  SCr 1.65 (baseline ~1)  PCT 59  Lactate 3.6  Afebrile  Patient received vancomycin 1500 mg IV and piperacillin/tazobactam 3.375 g IV once earlier today  Plan:  Piperacillin/tazobactam 3.375 g IV q8h EI  Vancomycin 1500 mg IV q48h  Goal AUC 400-500  Follow cultures  Closely monitor renal function and adjust vancomycin dose as needed  Check vancomycin levels once at steady state  Height: 5\' 9"  (175.3 cm) Weight: 182 lb 12.2 oz (82.9 kg) IBW/kg (Calculated) : 70.7  Temp (24hrs), Avg:97.5 F (36.4 C), Min:97.2 F (36.2 C), Max:97.9 F (36.6 C)  Recent Labs  Lab 07/29/18 0904 07/29/18 0928 07/29/18 1150  WBC 28.8*  --   --   CREATININE 1.65*  --   --   LATICACIDVEN  --  4.5* 3.6*    Estimated Creatinine Clearance: 36.3 mL/min (A) (by C-G formula based on SCr of 1.65 mg/dL (H)).    No Known Allergies  Antimicrobials this admission: vancomycin 9/16 >>  Piperacillin/tazobactam 9/16 >>   Dose adjustments this admission:  Microbiology results: 9/16 BCx: Sent 9/16 UCx: Sent  9/16 MRSA PCR: Sent  Thank you for allowing pharmacy to be a part of this patient's care.  Lenis Noon, PharmD, BCPS Clinical Pharmacist 07/29/2018 8:22 PM

## 2018-07-30 ENCOUNTER — Inpatient Hospital Stay (HOSPITAL_COMMUNITY): Payer: Medicare Other

## 2018-07-30 ENCOUNTER — Encounter (HOSPITAL_COMMUNITY): Payer: Self-pay | Admitting: *Deleted

## 2018-07-30 DIAGNOSIS — J189 Pneumonia, unspecified organism: Secondary | ICD-10-CM

## 2018-07-30 LAB — CBC WITH DIFFERENTIAL/PLATELET
Basophils Absolute: 0 10*3/uL (ref 0.0–0.1)
Basophils Relative: 0 %
EOS ABS: 0 10*3/uL (ref 0.0–0.7)
Eosinophils Relative: 0 %
HEMATOCRIT: 41.8 % (ref 39.0–52.0)
Hemoglobin: 14.3 g/dL (ref 13.0–17.0)
LYMPHS ABS: 0.8 10*3/uL (ref 0.7–4.0)
Lymphocytes Relative: 3 %
MCH: 32 pg (ref 26.0–34.0)
MCHC: 34.2 g/dL (ref 30.0–36.0)
MCV: 93.5 fL (ref 78.0–100.0)
MONOS PCT: 4 %
Monocytes Absolute: 0.9 10*3/uL (ref 0.1–1.0)
NEUTROS PCT: 93 %
Neutro Abs: 22.6 10*3/uL — ABNORMAL HIGH (ref 1.7–7.7)
Platelets: 123 10*3/uL — ABNORMAL LOW (ref 150–400)
RBC: 4.47 MIL/uL (ref 4.22–5.81)
RDW: 14.7 % (ref 11.5–15.5)
WBC: 24.2 10*3/uL — AB (ref 4.0–10.5)

## 2018-07-30 LAB — COMPREHENSIVE METABOLIC PANEL
ALT: 58 U/L — ABNORMAL HIGH (ref 0–44)
AST: 80 U/L — ABNORMAL HIGH (ref 15–41)
Albumin: 3 g/dL — ABNORMAL LOW (ref 3.5–5.0)
Alkaline Phosphatase: 139 U/L — ABNORMAL HIGH (ref 38–126)
Anion gap: 12 (ref 5–15)
BUN: 22 mg/dL (ref 8–23)
CHLORIDE: 102 mmol/L (ref 98–111)
CO2: 24 mmol/L (ref 22–32)
CREATININE: 1.22 mg/dL (ref 0.61–1.24)
Calcium: 8.4 mg/dL — ABNORMAL LOW (ref 8.9–10.3)
GFR, EST NON AFRICAN AMERICAN: 55 mL/min — AB (ref >60)
Glucose, Bld: 121 mg/dL — ABNORMAL HIGH (ref 70–99)
POTASSIUM: 4.6 mmol/L (ref 3.5–5.1)
SODIUM: 138 mmol/L (ref 135–145)
Total Bilirubin: 6.2 mg/dL — ABNORMAL HIGH (ref 0.3–1.2)
Total Protein: 6 g/dL — ABNORMAL LOW (ref 6.5–8.1)

## 2018-07-30 LAB — BLOOD CULTURE ID PANEL (REFLEXED)
Acinetobacter baumannii: NOT DETECTED
CANDIDA ALBICANS: NOT DETECTED
CANDIDA GLABRATA: NOT DETECTED
CANDIDA KRUSEI: NOT DETECTED
Candida parapsilosis: NOT DETECTED
Candida tropicalis: NOT DETECTED
Carbapenem resistance: NOT DETECTED
ENTEROBACTER CLOACAE COMPLEX: NOT DETECTED
ENTEROCOCCUS SPECIES: NOT DETECTED
ESCHERICHIA COLI: DETECTED — AB
Enterobacteriaceae species: DETECTED — AB
Haemophilus influenzae: NOT DETECTED
KLEBSIELLA OXYTOCA: NOT DETECTED
Klebsiella pneumoniae: NOT DETECTED
LISTERIA MONOCYTOGENES: NOT DETECTED
NEISSERIA MENINGITIDIS: NOT DETECTED
PROTEUS SPECIES: NOT DETECTED
Pseudomonas aeruginosa: NOT DETECTED
SERRATIA MARCESCENS: NOT DETECTED
STREPTOCOCCUS AGALACTIAE: NOT DETECTED
STREPTOCOCCUS PNEUMONIAE: NOT DETECTED
Staphylococcus aureus (BCID): NOT DETECTED
Staphylococcus species: NOT DETECTED
Streptococcus pyogenes: NOT DETECTED
Streptococcus species: NOT DETECTED

## 2018-07-30 LAB — LACTIC ACID, PLASMA: Lactic Acid, Venous: 2.3 mmol/L (ref 0.5–1.9)

## 2018-07-30 LAB — HEMOGLOBIN A1C
HEMOGLOBIN A1C: 5.8 % — AB (ref 4.8–5.6)
MEAN PLASMA GLUCOSE: 120 mg/dL

## 2018-07-30 LAB — MAGNESIUM: MAGNESIUM: 2 mg/dL (ref 1.7–2.4)

## 2018-07-30 MED ORDER — DILTIAZEM HCL ER COATED BEADS 180 MG PO CP24: 180.0000 mg | ORAL_CAPSULE | Freq: Every day | ORAL | Status: DC

## 2018-07-30 MED ORDER — HYDROMORPHONE HCL 1 MG/ML IJ SOLN
1.0000 mg | INTRAMUSCULAR | Status: AC
  Administered 2018-07-30: 1 mg via INTRAVENOUS

## 2018-07-30 MED ORDER — SODIUM CHLORIDE 0.9 % IV SOLN
2.0000 g | INTRAVENOUS | Status: DC
  Administered 2018-07-30: 2 g via INTRAVENOUS
  Filled 2018-07-30: qty 20
  Filled 2018-07-30: qty 2

## 2018-07-30 MED ORDER — METOPROLOL TARTRATE 5 MG/5ML IV SOLN
INTRAVENOUS | Status: AC
  Administered 2018-07-30: 5 mg
  Filled 2018-07-30: qty 5

## 2018-07-30 MED ORDER — DILTIAZEM HCL 25 MG/5ML IV SOLN
10.0000 mg | Freq: Once | INTRAVENOUS | Status: AC
  Administered 2018-07-30: 10 mg via INTRAVENOUS
  Filled 2018-07-30: qty 5

## 2018-07-30 MED ORDER — METOPROLOL TARTRATE 5 MG/5ML IV SOLN
5.0000 mg | Freq: Once | INTRAVENOUS | Status: AC
  Administered 2018-07-30: 5 mg via INTRAVENOUS

## 2018-07-30 MED ORDER — SODIUM CHLORIDE 0.9 % IV SOLN
500.0000 mg | INTRAVENOUS | Status: AC
  Administered 2018-07-30 – 2018-08-03 (×5): 500 mg via INTRAVENOUS
  Filled 2018-07-30 (×5): qty 500

## 2018-07-30 MED ORDER — METOPROLOL TARTRATE 25 MG PO TABS: 25.0000 mg | ORAL_TABLET | Freq: Once | ORAL | Status: AC

## 2018-07-30 MED ORDER — HYDROMORPHONE HCL 1 MG/ML IJ SOLN
INTRAMUSCULAR | Status: AC
  Administered 2018-07-30: 1 mg via INTRAVENOUS
  Filled 2018-07-30: qty 1

## 2018-07-30 MED ORDER — SODIUM CHLORIDE 0.9 % IV BOLUS
500.0000 mL | Freq: Once | INTRAVENOUS | Status: AC
  Administered 2018-07-30: 500 mL via INTRAVENOUS

## 2018-07-30 MED ORDER — DILTIAZEM HCL-DEXTROSE 100-5 MG/100ML-% IV SOLN (PREMIX)
5.0000 mg/h | INTRAVENOUS | Status: DC
  Administered 2018-07-30: 5 mg/h via INTRAVENOUS
  Administered 2018-07-31 – 2018-08-01 (×3): 12.5 mg/h via INTRAVENOUS
  Administered 2018-08-01 – 2018-08-02 (×2): 5 mg/h via INTRAVENOUS
  Filled 2018-07-30 (×7): qty 100

## 2018-07-30 MED ORDER — ACETAMINOPHEN 325 MG PO TABS
650.0000 mg | ORAL_TABLET | Freq: Four times a day (QID) | ORAL | Status: DC | PRN
  Administered 2018-07-30 – 2018-08-06 (×7): 650 mg via ORAL
  Filled 2018-07-30 (×7): qty 2

## 2018-07-30 MED ORDER — HYDROMORPHONE HCL 1 MG/ML IJ SOLN
1.0000 mg | Freq: Once | INTRAMUSCULAR | Status: AC
  Administered 2018-07-30: 1 mg via INTRAVENOUS
  Filled 2018-07-30: qty 1

## 2018-07-30 NOTE — Progress Notes (Signed)
PHARMACY - PHYSICIAN COMMUNICATION CRITICAL VALUE ALERT - BLOOD CULTURE IDENTIFICATION (BCID)  Samuel Flores is an 77 y.o. male who presented to [facility] on 07/29/2018 with a chief complaint of sepsis  Assessment:  Patient with severe sepsis but no clear source at this time. (include suspected source if known)  Name of physician (or Provider) Contacted: K. Schorr  Current antibiotics: Vancomycin and Zosyn   Changes to prescribed antibiotics recommended:  Continue Vancomycin and Zosyn for now and have AM rounding team decide if wish to narrow to Ceftriaxone 2gm iv q24hr (with flagyl added for intra-abdominal source) Recommendations accepted by provider  Results for orders placed or performed during the hospital encounter of 07/29/18  Blood Culture ID Panel (Reflexed) (Collected: 07/29/2018  9:04 AM)  Result Value Ref Range   Enterococcus species NOT DETECTED NOT DETECTED   Listeria monocytogenes NOT DETECTED NOT DETECTED   Staphylococcus species NOT DETECTED NOT DETECTED   Staphylococcus aureus NOT DETECTED NOT DETECTED   Streptococcus species NOT DETECTED NOT DETECTED   Streptococcus agalactiae NOT DETECTED NOT DETECTED   Streptococcus pneumoniae NOT DETECTED NOT DETECTED   Streptococcus pyogenes NOT DETECTED NOT DETECTED   Acinetobacter baumannii NOT DETECTED NOT DETECTED   Enterobacteriaceae species DETECTED (A) NOT DETECTED   Enterobacter cloacae complex NOT DETECTED NOT DETECTED   Escherichia coli DETECTED (A) NOT DETECTED   Klebsiella oxytoca NOT DETECTED NOT DETECTED   Klebsiella pneumoniae NOT DETECTED NOT DETECTED   Proteus species NOT DETECTED NOT DETECTED   Serratia marcescens NOT DETECTED NOT DETECTED   Carbapenem resistance NOT DETECTED NOT DETECTED   Haemophilus influenzae NOT DETECTED NOT DETECTED   Neisseria meningitidis NOT DETECTED NOT DETECTED   Pseudomonas aeruginosa NOT DETECTED NOT DETECTED   Candida albicans NOT DETECTED NOT DETECTED   Candida  glabrata NOT DETECTED NOT DETECTED   Candida krusei NOT DETECTED NOT DETECTED   Candida parapsilosis NOT DETECTED NOT DETECTED   Candida tropicalis NOT DETECTED NOT DETECTED    [name] 07/30/2018  6:21 AM

## 2018-07-30 NOTE — Progress Notes (Addendum)
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 Gaynelle Arabian  Wife was ill up to a week ago-had congestive symptoms and was treated with varied antibiotics-unclear if flu test was performed-states patient was doing fine and has been doing fine but yesterday evening developed some coughing progressing to posttussive vomitus of no bloody material mucoid-some mild abdominal pain epigastric-resolved overnight after admission 9/16 Feeling weak-unable to ambulate and feeling dizzy-no chest pain currently-fever noted at home-no rash no blurred vision no double vision no diarrhea other than last night symptoms which seem to have been associated with 1-2 episodes of this Slept very poorly last p.m.    Patient ambulates down his driveway 324 feet but feels winded-this is been going on antecedent to admission last year Patient given 3 boluses of 1500 cc total started as below  ED Course: Early goal-directed sepsis Rx started procalcitonin 59 lactic acid 4.5 WBC 28 BNP 438 troponin 0 0.04 BUN/creatinine 29/1.6 alk phos 172 AST/ALT up from baseline to 97/54 CO2 20   Assessment & Plan:   Active Problems:   Sepsis ([place])   1] sepsis blood culture positive for E. coli 1 out of 2.  MRSA PCR negative DC vancomycin and START ROCEPHIN.  I do not have a urine culture on him.  Chest x-ray shows mild patchy and linear airspace opacities in the lingula and left lower lobe may reflect bronchopneumonia or bronchitis.  Follow-up final blood  culture.  Initial lactic acid 4.5 down to 3.6 procalcitonin 59.06.  Leukocytosis on admission 28.8 down to 24.2.  Slow IV hydration.  BNP was elevated at 438.  Addendum 6.10-ct scan chest with minimal pleural effusion and RLL atelectasis or pneumonia.this was done as patient was c/o severe pain right side of chest.gall bladder ultrasound  Pending.?PLEURITIS.  2] A. fib RVR with hypotension started on Cardizem drip continue.  Metoprolol is on hold due to soft blood pressure.  Dr Virgina Jock to see patient tomorrow.dw him  3] AKI improved with hydration creatinine down to 1.22.  Upon admission it was 1.65.  4] transaminitis monitor and follow.  Total bilirubin 5.3 on admission increased to 6.2.  Possibly secondary to sepsis.  He reports he quit drinking alcohol many months ago #2018.  Alkaline phosphatase is also mildly elevated at 139.  I will order  right upper quadrant ultrasound.  5] history of prostate cancer  6] history of gout continue allopurinol.   DVT prophylaxis: Eliquis Code Status: DO NOT RESUSCITATE Family Communication: dw wife Disposition Plan: TBD   Consultants: Called cardiology consulted with Dr. [name]  Procedures: None Antimicrobials: Zosyn and Vanco  Subjective: Sitting up in the chair he denies any chest pain has some difficulty breathing denies any nausea vomiting diarrhea or abdominal pain.   Objective: Vitals:   07/30/18 0800 07/30/18 0900 07/30/18 1000 07/30/18 1032  BP: (!) 78/44 (!) 80/47 96/74 (!) 93/55  Pulse: (!) 140 (!) 130 (!) 138 (!) 128  Resp: (!) 28 (!) 31 (!) 31 19  Temp:      TempSrc:      SpO2:  99% 93% 95% 96%  Weight:      Height:        Intake/Output Summary (Last 24 hours) at 07/30/2018 1145 Last data filed at 07/30/2018 1052 Gross per 24 hour  Intake 4938.02 ml  Output 1650 ml  Net 3288.02 ml   Filed Weights   07/29/18 1432  Weight: 82.9 kg    Examination:  General exam: Appears calm and comfortable  Respiratory system:  Scattered rhonchi auscultation. Respiratory effort normal. Cardiovascular system: S1 & S2 heard, RRR. No JVD, murmurs, rubs, gallops or clicks. No pedal edema. Gastrointestinal system: Abdomen is nondistended, soft and nontender. No organomegaly or masses felt. Normal bowel sounds heard. Central nervous system: Alert and oriented. No focal neurological deficits. Extremities trace edema Skin: No rashes, lesions or ulcers Psychiatry: Judgement and insight appear normal. Mood & affect appropriate.     Data Reviewed: I have personally reviewed following labs and imaging studies  CBC: Recent Labs  Lab 07/29/18 0904 07/30/18 0259  WBC 28.8* 24.2*  NEUTROABS 26.6* 22.6*  HGB 15.9 14.3  HCT 44.6 41.8  MCV 92.0 93.5  PLT 152 476*   Basic Metabolic Panel: Recent Labs  Lab 07/29/18 0904 07/29/18 1455 07/30/18 0259  NA 137  --  138  K 3.6  --  4.6  CL 100  --  102  CO2 21*  --  24  GLUCOSE 144*  --  121*  BUN 29*  --  22  CREATININE 1.65*  --  1.22  CALCIUM 9.0  --  8.4*  MG  --  1.3* 2.0   GFR: Estimated Creatinine Clearance: 49.1 mL/min (by C-G formula based on SCr of 1.22 mg/dL). Liver Function Tests: Recent Labs  Lab 07/29/18 0904 07/30/18 0259  AST 97* 80*  ALT 54* 58*  ALKPHOS 172* 139*  BILITOT 5.3* 6.2*  PROT 6.2* 6.0*  ALBUMIN 3.3* 3.0*   Recent Labs  Lab 07/29/18 0904  LIPASE 28   No results for input(s): AMMONIA in the last 168 hours. Coagulation Profile: Recent Labs  Lab 07/29/18 0904  INR 1.64   Cardiac Enzymes: Recent Labs  Lab 07/29/18 0904  TROPONINI 0.04*   BNP (last 3 results) No results for input(s): PROBNP in the last 8760 hours. HbA1C: Recent Labs    07/29/18 1455  HGBA1C 5.8*   CBG: No results for input(s): GLUCAP in the last 168 hours. Lipid Profile: No results for input(s): CHOL, HDL, LDLCALC, TRIG, CHOLHDL, LDLDIRECT in the last 72 hours. Thyroid Function Tests: No results for input(s): TSH, T4TOTAL, FREET4, T3FREE,  THYROIDAB in the last 72 hours. Anemia Panel: No results for input(s): VITAMINB12, FOLATE, FERRITIN, TIBC, IRON, RETICCTPCT in the last 72 hours. Sepsis Labs: Recent Labs  Lab 07/29/18 0928 07/29/18 1150  PROCALCITON  --  59.06  LATICACIDVEN 4.5* 3.6*    Recent Results (from the past 240 hour(s))  Culture, blood (routine x 2)     Status: None (Preliminary result)   Collection Time: 07/29/18  9:04 AM  Result Value Ref Range Status   Specimen Description   Final    BLOOD LEFT ANTECUBITAL Performed at [facility] [place] [address] [place], [place] [address]    Special Requests   Final    BOTTLES DRAWN AEROBIC AND ANAEROBIC Blood Culture adequate volume Performed at [facility] [address], [place], [place] [address]    Culture  Setup Time   Final    GRAM NEGATIVE RODS IN BOTH AEROBIC AND  ANAEROBIC BOTTLES CRITICAL VALUE NOTED.  VALUE IS CONSISTENT WITH PREVIOUSLY REPORTED AND CALLED VALUE. Performed at [facility], [place] [address], [place], [place] [address]    Culture PENDING  Incomplete   Report Status PENDING  Incomplete  Culture, blood (routine x 2)     Status: None (Preliminary result)   Collection Time: 07/29/18  9:04 AM  Result Value Ref Range Status   Specimen Description   Final    BLOOD BLOOD RIGHT FOREARM Performed at [facility] [address] [place], [place] [address]    Special Requests   Final    BOTTLES DRAWN AEROBIC AND ANAEROBIC Blood Culture results may not be optimal due to an excessive volume of blood received in culture bottles Performed at [facility] [address], [place], [place] [address]    Culture  Setup Time   Final    IN BOTH AEROBIC AND ANAEROBIC BOTTLES GRAM NEGATIVE RODS Organism ID to follow CRITICAL RESULT CALLED TO, READ BACK BY AND VERIFIED WITHLavell Luster PHARMD [id number] 07/30/18 A BROWNING Performed at [facility], [place] [address],  [place], [place] [address]    Culture GRAM NEGATIVE RODS  Final   Report Status PENDING  Incomplete  Blood Culture ID Panel (Reflexed)     Status: Abnormal   Collection Time: 07/29/18  9:04 AM  Result Value Ref Range Status   Enterococcus species NOT DETECTED NOT DETECTED Final   Listeria monocytogenes NOT DETECTED NOT DETECTED Final   Staphylococcus species NOT DETECTED NOT DETECTED Final   Staphylococcus aureus NOT DETECTED NOT DETECTED Final   Streptococcus species NOT DETECTED NOT DETECTED Final   Streptococcus agalactiae NOT DETECTED NOT DETECTED Final   Streptococcus pneumoniae NOT DETECTED NOT DETECTED Final   Streptococcus pyogenes NOT DETECTED NOT DETECTED Final   Acinetobacter baumannii NOT DETECTED NOT DETECTED Final   Enterobacteriaceae species DETECTED (A) NOT DETECTED Final    Comment: Enterobacteriaceae represent a large family of gram-negative bacteria, not a single organism. CRITICAL RESULT CALLED TO, READ BACK BY AND VERIFIED WITH: Lavell Luster PHARMD [id number] 07/30/18 A BROWNING    Enterobacter cloacae complex NOT DETECTED NOT DETECTED Final   Escherichia coli DETECTED (A) NOT DETECTED Final    Comment: CRITICAL RESULT CALLED TO, READ BACK BY AND VERIFIED WITH: Lavell Luster PHARMD [id number] 07/30/18 A BROWNING    Klebsiella oxytoca NOT DETECTED NOT DETECTED Final   Klebsiella pneumoniae NOT DETECTED NOT DETECTED Final   Proteus species NOT DETECTED NOT DETECTED Final   Serratia marcescens NOT DETECTED NOT DETECTED Final   Carbapenem resistance NOT DETECTED NOT DETECTED Final   Haemophilus influenzae NOT DETECTED NOT DETECTED Final   Neisseria meningitidis NOT DETECTED NOT DETECTED Final   Pseudomonas aeruginosa NOT DETECTED NOT DETECTED Final   Candida albicans NOT DETECTED NOT DETECTED Final   Candida glabrata NOT DETECTED NOT DETECTED Final   Candida krusei NOT DETECTED NOT DETECTED Final   Candida parapsilosis NOT DETECTED NOT DETECTED Final   Candida tropicalis NOT DETECTED  NOT DETECTED Final    Comment: Performed at [facility], [place] [address], [place], [place] [address]  MRSA PCR Screening     Status: None   Collection Time: 07/29/18  2:54 PM  Result Value Ref Range Status   MRSA by PCR NEGATIVE NEGATIVE Final    Comment:        The GeneXpert MRSA Assay (FDA approved for NASAL specimens only), is one component of  a comprehensive MRSA colonization surveillance program. It is not intended to diagnose MRSA infection nor to guide or monitor treatment for MRSA infections. Performed at [facility], [place] [address] [place], [place] [address]          Radiology Studies: Dg Chest 1 View  Result Date: 07/29/2018 CLINICAL DATA:  Shortness of breath, hypertension EXAM: CHEST  1 VIEW COMPARISON:  09/30/2017 FINDINGS: Low volumes with bibasilar atelectasis. Heart is borderline in size. No effusions or acute bony abnormality. IMPRESSION: Low lung volumes with bibasilar atelectasis. Electronically Signed   By: Rolm Baptise M.D.   On: 07/29/2018 09:19   X-ray Chest Pa And Lateral  Result Date: 07/30/2018 CLINICAL DATA:  77 year old male with a diagnosis of pneumonia EXAM: CHEST - 2 VIEW COMPARISON:  Prior chest x-ray yesterday 07/29/2018 FINDINGS: Stable cardiac and mediastinal contours with borderline cardiomegaly. Atherosclerotic calcifications again noted in the transverse aorta. Mild patchy and linear airspace opacities in the lingula and left lower lobe. No pleural effusion, pneumothorax or pulmonary edema. The right lung is clear. Visualized upper abdominal bowel gas pattern is unremarkable. No acute osseous abnormality. Incompletely imaged cervicothoracic anterior stabilization hardware. IMPRESSION: 1. Mild patchy and linear airspace opacities in the lingula and left lower lobe may reflect bronchopneumonia or acute on chronic bronchitis. 2. Stable mild cardiomegaly. 3.  Aortic Atherosclerosis (ICD10-170.0) Electronically Signed   By:  Jacqulynn Cadet M.D.   On: 07/30/2018 09:24        Scheduled Meds: . allopurinol  200 mg Oral Daily  . apixaban  5 mg Oral BID  . fluticasone  2 spray Each Nare Daily  . sodium chloride flush  3 mL Intravenous Q12H   Continuous Infusions: . sodium chloride 100 mL/hr at 07/30/18 0640  . diltiazem (CARDIZEM) infusion 2.5 mg/hr (07/30/18 6629)  . piperacillin-tazobactam (ZOSYN)  IV Stopped (07/30/18 1052)  . [START ON 07/31/2018] vancomycin       LOS: 1 day    Georgette Shell, MD [facility]  If 7PM-7AM, please contact night-coverage [email or web address] Password [email or web address] 07/30/2018, 11:45 AM

## 2018-07-30 NOTE — Progress Notes (Signed)
MD aware of pt's BP throughout shift at times <90 SBP even on 2.5mg /hr of Cardizem drip. Will continue to monitor pt and current rate of Cardizem still at 5mg /hr

## 2018-07-30 NOTE — Progress Notes (Signed)
Titrated Cardizem to 5mg /hr per v/o from Dr. Zigmund Daniel from Cardiology.  Will monitor BP and HR per protocol

## 2018-07-30 NOTE — Care Management Note (Signed)
Case Management Note  Patient Details  Name: Samuel Flores MRN: [id number] Date of Birth: June 04, 1941  Subjective/Objective:     HR-178, temp-101.3,160/136,iv ns at 100cc/hrs,iv cardizem drip, iv zosyn and vancomycin, wbc 24.2 , bld cultures + for GNR.  Action/Plan: Will follow for progression of care and clinical status. Will follow for case management needs none present at this time.  Expected Discharge Date:  (unknown)               Expected Discharge Plan:  Home/Self Care  In-House Referral:     Discharge planning Services  CM Consult  Post Acute Care Choice:    Choice offered to:     DME Arranged:    DME Agency:     HH Arranged:    HH Agency:     Status of Service:  In process, will continue to follow  If discussed at Long Length of Stay Meetings, dates discussed:    Additional Comments:  Leeroy Cha, RN 07/30/2018, 8:52 AM

## 2018-07-31 ENCOUNTER — Encounter (HOSPITAL_COMMUNITY): Payer: Self-pay | Admitting: *Deleted

## 2018-07-31 ENCOUNTER — Inpatient Hospital Stay (HOSPITAL_COMMUNITY): Payer: Medicare Other

## 2018-07-31 DIAGNOSIS — N179 Acute kidney failure, unspecified: Secondary | ICD-10-CM

## 2018-07-31 DIAGNOSIS — A4151 Sepsis due to Escherichia coli [E. coli]: Principal | ICD-10-CM

## 2018-07-31 DIAGNOSIS — R140 Abdominal distension (gaseous): Secondary | ICD-10-CM

## 2018-07-31 DIAGNOSIS — I4891 Unspecified atrial fibrillation: Secondary | ICD-10-CM

## 2018-07-31 LAB — CBC WITH DIFFERENTIAL/PLATELET
BASOS ABS: 0 10*3/uL (ref 0.0–0.1)
Basophils Relative: 0 %
EOS ABS: 0 10*3/uL (ref 0.0–0.7)
Eosinophils Relative: 0 %
HEMATOCRIT: 42.5 % (ref 39.0–52.0)
Hemoglobin: 14.5 g/dL (ref 13.0–17.0)
LYMPHS ABS: 0.5 10*3/uL — AB (ref 0.7–4.0)
Lymphocytes Relative: 4 %
MCH: 31.7 pg (ref 26.0–34.0)
MCHC: 34.1 g/dL (ref 30.0–36.0)
MCV: 93 fL (ref 78.0–100.0)
MONO ABS: 0.9 10*3/uL (ref 0.1–1.0)
Monocytes Relative: 7 %
NEUTROS ABS: 12 10*3/uL — AB (ref 1.7–7.7)
Neutrophils Relative %: 89 %
PLATELETS: 84 10*3/uL — AB (ref 150–400)
RBC: 4.57 MIL/uL (ref 4.22–5.81)
RDW: 15 % (ref 11.5–15.5)
WBC: 13.4 10*3/uL — ABNORMAL HIGH (ref 4.0–10.5)

## 2018-07-31 LAB — COMPREHENSIVE METABOLIC PANEL
ALBUMIN: 2.5 g/dL — AB (ref 3.5–5.0)
ALK PHOS: 111 U/L (ref 38–126)
ALT: 37 U/L (ref 0–44)
AST: 48 U/L — ABNORMAL HIGH (ref 15–41)
Anion gap: 9 (ref 5–15)
BILIRUBIN TOTAL: 6.3 mg/dL — AB (ref 0.3–1.2)
BUN: 15 mg/dL (ref 8–23)
CALCIUM: 7.6 mg/dL — AB (ref 8.9–10.3)
CO2: 22 mmol/L (ref 22–32)
CREATININE: 0.65 mg/dL (ref 0.61–1.24)
Chloride: 104 mmol/L (ref 98–111)
GFR calc Af Amer: >60 mL/min (ref >60)
Glucose, Bld: 126 mg/dL — ABNORMAL HIGH (ref 70–99)
Potassium: 4 mmol/L (ref 3.5–5.1)
Sodium: 135 mmol/L (ref 135–145)
TOTAL PROTEIN: 5.3 g/dL — AB (ref 6.5–8.1)

## 2018-07-31 LAB — CULTURE, BLOOD (ROUTINE X 2)

## 2018-07-31 MED ORDER — METOPROLOL TARTRATE 5 MG/5ML IV SOLN
5.0000 mg | Freq: Once | INTRAVENOUS | Status: AC
  Administered 2018-07-31: 5 mg via INTRAVENOUS
  Filled 2018-07-31: qty 5

## 2018-07-31 MED ORDER — HYDROMORPHONE HCL 1 MG/ML IJ SOLN
1.0000 mg | Freq: Once | INTRAMUSCULAR | Status: AC
  Administered 2018-07-31: 1 mg via INTRAVENOUS
  Filled 2018-07-31: qty 1

## 2018-07-31 MED ORDER — ONDANSETRON HCL 4 MG/2ML IJ SOLN
4.0000 mg | Freq: Four times a day (QID) | INTRAMUSCULAR | Status: DC | PRN
  Administered 2018-08-08: 4 mg via INTRAVENOUS
  Filled 2018-07-31: qty 2

## 2018-07-31 MED ORDER — MORPHINE SULFATE (PF) 4 MG/ML IV SOLN
4.0000 mg | INTRAVENOUS | Status: DC | PRN
  Administered 2018-07-31 – 2018-08-02 (×4): 4 mg via INTRAVENOUS
  Filled 2018-07-31 (×5): qty 1

## 2018-07-31 MED ORDER — OXYCODONE-ACETAMINOPHEN 5-325 MG PO TABS
1.0000 | ORAL_TABLET | ORAL | Status: DC | PRN
  Administered 2018-08-02 – 2018-08-03 (×2): 2 via ORAL
  Filled 2018-07-31 (×2): qty 2

## 2018-07-31 MED ORDER — METOPROLOL TARTRATE 25 MG PO TABS
25.0000 mg | ORAL_TABLET | Freq: Two times a day (BID) | ORAL | Status: DC
  Administered 2018-07-31 (×2): 25 mg via ORAL
  Filled 2018-07-31 (×2): qty 1

## 2018-07-31 MED ORDER — SODIUM CHLORIDE 0.9 % IV SOLN
3.0000 g | Freq: Four times a day (QID) | INTRAVENOUS | Status: DC
  Administered 2018-07-31 – 2018-08-05 (×19): 3 g via INTRAVENOUS
  Filled 2018-07-31 (×22): qty 3

## 2018-07-31 MED ORDER — OXYCODONE-ACETAMINOPHEN 5-325 MG PO TABS
1.0000 | ORAL_TABLET | ORAL | Status: DC | PRN
  Administered 2018-07-31: 2 via ORAL
  Filled 2018-07-31: qty 2

## 2018-07-31 NOTE — Progress Notes (Addendum)
PROGRESS NOTE    Samuel Flores  TOI:[id number] DOB: [birth year] DOA: 07/29/2018 PCP: Alycia Rossetti, MD   Brief Narrative: Samuel Flores is a 77 y.o. male with a history of atrial fibrillation, alcohol abuse. He presented secondary to feeling weak and found to have concern for sepsis secondary to e. Coli bacteremia, in addition to atrial fibrillation with RVR. He is being treated with IV antibiotics.   Assessment & Plan:   Active Problems:   Hypotension   Sepsis ([place])   Pneumonia due to infectious organism   Sepsis Secondary to E. Coli bacteremia. Still with tachycardia in setting of atrial fibrillation -Continue antibiotics  Atrial fibrillation with RVR In setting of sepsis. Complicated by mild hypotension. Cardiology consulted.  -Continue Cardizem drip -Metoprolol -Cardiology recommendations -Eliquis  Acute kidney injury Baseline of 1. Up to 1.65 on admission. Resolved.  Elevated AST/ALT RUQ ultrasound significant for questionable hepatocellular disease. Improved.  History of prostate cancer  Gout -Continue allopurinol  Abdominal distension Minimal bowel sounds, mild pain. Ordered abdominal x-ray which suggests possible ileus -CT abdomen/pelvis   DVT prophylaxis: Eliquis Code Status:   Code Status: DNR Family Communication: None at bedside Disposition Plan: Discharge pending improvement of atrial fibrillation and transition to oral antibiotics for bacteremia   Consultants:   9/17: Cardiology, Dr. [name]  Procedures:   None  Antimicrobials:  Vancomycin  Zosyn  Ceftriaxone  Azithromycin   Unasyn   Subjective: Abdominal pain. Nausea this morning without emesis.  Objective: Vitals:   07/31/18 0423 07/31/18 0500 07/31/18 0600 07/31/18 0700  BP: 121/65 (!) 116/59 126/70 114/61  Pulse: (!) 139 (!) 139 (!) 137 (!) 158  Resp: (!) 24 (!) 31 (!) 28 (!) 25  Temp: 98.3 F (36.8 C)     TempSrc: Oral     SpO2: 97% 97% 96% 96%  Weight:       Height:        Intake/Output Summary (Last 24 hours) at 07/31/2018 0802 Last data filed at 07/31/2018 0728 Gross per 24 hour  Intake 3045.28 ml  Output 1550 ml  Net 1495.28 ml   Filed Weights   07/29/18 1432  Weight: 82.9 kg    Examination:  General exam: Appears calm and comfortable Respiratory system: Clear to auscultation. Respiratory effort normal. Cardiovascular system: S1 & S2 heard, fast rate with irregular rhythm. No murmurs, rubs, gallops or clicks. Gastrointestinal system: Abdomen is distended, soft and mildly tender especially in LUQ. Diminished bowel sounds. Central nervous system: Alert and oriented. No focal neurological deficits. Extremities: No edema. No calf tenderness Skin: No cyanosis. No rashes Psychiatry: Judgement and insight appear normal. Mood & affect appropriate.     Data Reviewed: I have personally reviewed following labs and imaging studies  CBC: Recent Labs  Lab 07/29/18 0904 07/30/18 0259 07/31/18 0304  WBC 28.8* 24.2* 13.4*  NEUTROABS 26.6* 22.6* 12.0*  HGB 15.9 14.3 14.5  HCT 44.6 41.8 42.5  MCV 92.0 93.5 93.0  PLT 152 123* 84*   Basic Metabolic Panel: Recent Labs  Lab 07/29/18 0904 07/29/18 1455 07/30/18 0259 07/31/18 0304  NA 137  --  138 135  K 3.6  --  4.6 4.0  CL 100  --  102 104  CO2 21*  --  24 22  GLUCOSE 144*  --  121* 126*  BUN 29*  --  22 15  CREATININE 1.65*  --  1.22 0.65  CALCIUM 9.0  --  8.4* 7.6*  MG  --  1.3* 2.0  --  GFR: Estimated Creatinine Clearance: 74.9 mL/min (by C-G formula based on SCr of 0.65 mg/dL). Liver Function Tests: Recent Labs  Lab 07/29/18 0904 07/30/18 0259 07/31/18 0304  AST 97* 80* 48*  ALT 54* 58* 37  ALKPHOS 172* 139* 111  BILITOT 5.3* 6.2* 6.3*  PROT 6.2* 6.0* 5.3*  ALBUMIN 3.3* 3.0* 2.5*   Recent Labs  Lab 07/29/18 0904  LIPASE 28   No results for input(s): AMMONIA in the last 168 hours. Coagulation Profile: Recent Labs  Lab 07/29/18 0904  INR 1.64    Cardiac Enzymes: Recent Labs  Lab 07/29/18 0904  TROPONINI 0.04*   BNP (last 3 results) No results for input(s): PROBNP in the last 8760 hours. HbA1C: Recent Labs    07/29/18 1455  HGBA1C 5.8*   CBG: No results for input(s): GLUCAP in the last 168 hours. Lipid Profile: No results for input(s): CHOL, HDL, LDLCALC, TRIG, CHOLHDL, LDLDIRECT in the last 72 hours. Thyroid Function Tests: No results for input(s): TSH, T4TOTAL, FREET4, T3FREE, THYROIDAB in the last 72 hours. Anemia Panel: No results for input(s): VITAMINB12, FOLATE, FERRITIN, TIBC, IRON, RETICCTPCT in the last 72 hours. Sepsis Labs: Recent Labs  Lab 07/29/18 0928 07/29/18 1150 07/30/18 1659  PROCALCITON  --  59.06  --   LATICACIDVEN 4.5* 3.6* 2.3*    Recent Results (from the past 240 hour(s))  Culture, blood (routine x 2)     Status: None (Preliminary result)   Collection Time: 07/29/18  9:04 AM  Result Value Ref Range Status   Specimen Description   Final    BLOOD LEFT ANTECUBITAL Performed at [facility], [place] [address], [place], [place] [address]    Special Requests   Final    BOTTLES DRAWN AEROBIC AND ANAEROBIC Blood Culture adequate volume Performed at [facility] [address] [place], [place] [address]    Culture  Setup Time   Final    GRAM NEGATIVE RODS IN BOTH AEROBIC AND ANAEROBIC BOTTLES CRITICAL VALUE NOTED.  VALUE IS CONSISTENT WITH PREVIOUSLY REPORTED AND CALLED VALUE. Performed at [facility], [place] [address] [place], [place] [address]    Culture GRAM NEGATIVE RODS  Final   Report Status PENDING  Incomplete  Culture, blood (routine x 2)     Status: Abnormal   Collection Time: 07/29/18  9:04 AM  Result Value Ref Range Status   Specimen Description   Final    BLOOD BLOOD RIGHT FOREARM Performed at [place] [facility] [address], [place], [place] [address]    Special Requests   Final    BOTTLES DRAWN AEROBIC AND  ANAEROBIC Blood Culture results may not be optimal due to an excessive volume of blood received in culture bottles Performed at [facility] [address], [place], [place] [address]    Culture  Setup Time   Final    IN BOTH AEROBIC AND ANAEROBIC BOTTLES GRAM NEGATIVE RODS CRITICAL RESULT CALLED TO, READ BACK BY AND VERIFIED WITHLavell Luster PHARMD [id number] 07/30/18 A BROWNING Performed at [facility], [place] [address], [place], [place] [address]    Culture ESCHERICHIA COLI (A)  Final   Report Status 07/31/2018 FINAL  Final   Organism ID, Bacteria ESCHERICHIA COLI  Final      Susceptibility   Escherichia coli - MIC*    AMPICILLIN <=2 SENSITIVE Sensitive     CEFAZOLIN <=4 SENSITIVE Sensitive     CEFEPIME <=1 SENSITIVE Sensitive  CEFTAZIDIME <=1 SENSITIVE Sensitive     CEFTRIAXONE <=1 SENSITIVE Sensitive     CIPROFLOXACIN <=0.25 SENSITIVE Sensitive     GENTAMICIN <=1 SENSITIVE Sensitive     IMIPENEM <=0.25 SENSITIVE Sensitive     TRIMETH/SULFA <=20 SENSITIVE Sensitive     AMPICILLIN/SULBACTAM <=2 SENSITIVE Sensitive     PIP/TAZO <=4 SENSITIVE Sensitive     Extended ESBL NEGATIVE Sensitive     * ESCHERICHIA COLI  Blood Culture ID Panel (Reflexed)     Status: Abnormal   Collection Time: 07/29/18  9:04 AM  Result Value Ref Range Status   Enterococcus species NOT DETECTED NOT DETECTED Final   Listeria monocytogenes NOT DETECTED NOT DETECTED Final   Staphylococcus species NOT DETECTED NOT DETECTED Final   Staphylococcus aureus NOT DETECTED NOT DETECTED Final   Streptococcus species NOT DETECTED NOT DETECTED Final   Streptococcus agalactiae NOT DETECTED NOT DETECTED Final   Streptococcus pneumoniae NOT DETECTED NOT DETECTED Final   Streptococcus pyogenes NOT DETECTED NOT DETECTED Final   Acinetobacter baumannii NOT DETECTED NOT DETECTED Final   Enterobacteriaceae species DETECTED (A) NOT DETECTED Final    Comment: Enterobacteriaceae represent a large family of  gram-negative bacteria, not a single organism. CRITICAL RESULT CALLED TO, READ BACK BY AND VERIFIED WITH: Lavell Luster PHARMD [id number] 07/30/18 A BROWNING    Enterobacter cloacae complex NOT DETECTED NOT DETECTED Final   Escherichia coli DETECTED (A) NOT DETECTED Final    Comment: CRITICAL RESULT CALLED TO, READ BACK BY AND VERIFIED WITH: Lavell Luster PHARMD [id number] 07/30/18 A BROWNING    Klebsiella oxytoca NOT DETECTED NOT DETECTED Final   Klebsiella pneumoniae NOT DETECTED NOT DETECTED Final   Proteus species NOT DETECTED NOT DETECTED Final   Serratia marcescens NOT DETECTED NOT DETECTED Final   Carbapenem resistance NOT DETECTED NOT DETECTED Final   Haemophilus influenzae NOT DETECTED NOT DETECTED Final   Neisseria meningitidis NOT DETECTED NOT DETECTED Final   Pseudomonas aeruginosa NOT DETECTED NOT DETECTED Final   Candida albicans NOT DETECTED NOT DETECTED Final   Candida glabrata NOT DETECTED NOT DETECTED Final   Candida krusei NOT DETECTED NOT DETECTED Final   Candida parapsilosis NOT DETECTED NOT DETECTED Final   Candida tropicalis NOT DETECTED NOT DETECTED Final    Comment: Performed at [facility], [place] [address], [place], [place] [address]  MRSA PCR Screening     Status: None   Collection Time: 07/29/18  2:54 PM  Result Value Ref Range Status   MRSA by PCR NEGATIVE NEGATIVE Final    Comment:        The GeneXpert MRSA Assay (FDA approved for NASAL specimens only), is one component of a comprehensive MRSA colonization surveillance program. It is not intended to diagnose MRSA infection nor to guide or monitor treatment for MRSA infections. Performed at [facility], [place] [address], [place], [place] [address]          Radiology Studies: Dg Chest 1 View  Result Date: 07/29/2018 CLINICAL DATA:  Shortness of breath, hypertension EXAM: CHEST  1 VIEW COMPARISON:  09/30/2017 FINDINGS: Low volumes with bibasilar atelectasis. Heart is borderline in size.  No effusions or acute bony abnormality. IMPRESSION: Low lung volumes with bibasilar atelectasis. Electronically Signed   By: Rolm Baptise M.D.   On: 07/29/2018 09:19   X-ray Chest Pa And Lateral  Result Date: 07/30/2018 CLINICAL DATA:  77 year old male with a diagnosis of pneumonia EXAM: CHEST - 2 VIEW COMPARISON:  Prior chest x-ray yesterday 07/29/2018  FINDINGS: Stable cardiac and mediastinal contours with borderline cardiomegaly. Atherosclerotic calcifications again noted in the transverse aorta. Mild patchy and linear airspace opacities in the lingula and left lower lobe. No pleural effusion, pneumothorax or pulmonary edema. The right lung is clear. Visualized upper abdominal bowel gas pattern is unremarkable. No acute osseous abnormality. Incompletely imaged cervicothoracic anterior stabilization hardware. IMPRESSION: 1. Mild patchy and linear airspace opacities in the lingula and left lower lobe may reflect bronchopneumonia or acute on chronic bronchitis. 2. Stable mild cardiomegaly. 3.  Aortic Atherosclerosis (ICD10-170.0) Electronically Signed   By: Jacqulynn Cadet M.D.   On: 07/30/2018 09:24   Ct Chest Wo Contrast  Result Date: 07/30/2018 CLINICAL DATA:  Shortness of breath. History of hypertension, prostate cancer. EXAM: CT CHEST WITHOUT CONTRAST TECHNIQUE: Multidetector CT imaging of the chest was performed following the standard protocol without IV contrast. COMPARISON:  Chest radiograph July 30, 2018 and CT chest September 26, 2017 FINDINGS: CARDIOVASCULAR: The heart is mildly enlarged. Severe coronary artery calcifications. No pericardial effusion. Thoracic aorta is normal course and caliber, mild calcific atherosclerosis. MEDIASTINUM/NODES: No mediastinal mass. No lymphadenopathy by CT size criteria. Normal appearance of thoracic esophagus though not tailored for evaluation. LUNGS/PLEURA: Tracheobronchial tree is patent, no pneumothorax. Mild bronchial wall thickening. Minimal pleural  effusions. RIGHT lower lobe consolidation with air bronchograms. Punctate RIGHT lower lobe calcifications. Mild lower lobe bronchiectasis. Stable 3 mm subsolid LEFT upper lobe subpleural nodule. UPPER ABDOMEN: Elevated RIGHT hemidiaphragm. Nodular cirrhotic liver. Distended gallbladder with mild suspected wall edema, similar to prior CT. MUSCULOSKELETAL: Nonacute. ACDF. Severe RIGHT acromioclavicular osteoarthrosis. IMPRESSION: 1. Minimal pleural effusions and RIGHT lower lobe atelectasis and/or pneumonia. 2. Cirrhosis. Mild gallbladder distension and edema appear chronic though if there are referable symptoms consider RIGHT upper quadrant ultrasound. Aortic Atherosclerosis (ICD10-I70.0). Electronically Signed   By: Elon Alas M.D.   On: 07/30/2018 17:38   Dg Chest Port 1 View  Result Date: 07/30/2018 CLINICAL DATA:  Worsening right-sided chest pain since this morning. History of coronary artery disease, sepsis, pneumonia. EXAM: PORTABLE CHEST 1 VIEW COMPARISON:  PA and lateral chest x-ray of May 29, 2018. FINDINGS: The lungs are mildly hypoinflated. The right hemidiaphragm is slightly higher than the left. There is patchy density at the right lung base. There is hazy increased density obscuring the lateral aspect of the left hemidiaphragm. The cardiac silhouette is enlarged. The pulmonary vascularity is normal. There is calcification in the wall of the aortic arch. The bony thorax exhibits no acute abnormality. IMPRESSION: Bibasilar atelectasis or pneumonia not greatly changed from the earlier study. Enlargement of the cardiac silhouette without pulmonary vascular congestion. Thoracic aortic atherosclerosis. Electronically Signed   By: David  Martinique M.D.   On: 07/30/2018 13:20   US Abdomen Limited Ruq  Result Date: 07/30/2018 CLINICAL DATA:  Abnormal liver function studies EXAM: ULTRASOUND ABDOMEN LIMITED RIGHT UPPER QUADRANT COMPARISON:  Right upper quadrant abdominal ultrasound of September 26, 2017 FINDINGS: Gallbladder: No gallstones or wall thickening visualized. No sonographic Murphy sign noted by sonographer. Common bile duct: Diameter: 4.2 mm Liver: The hepatic echotexture is heterogeneous. The surface contour of the liver remains smooth. There is no focal mass nor ductal dilation. Portal vein is patent on color Doppler imaging with normal direction of blood flow towards the liver. IMPRESSION: Heterogeneous hepatic echotexture may reflect hepatocellular disease. There is no discrete mass nor ductal dilation however. Normal appearance of the gallbladder and common bile duct. Electronically Signed   By: David  Martinique M.D.   On:  07/30/2018 14:48        Scheduled Meds: . allopurinol  200 mg Oral Daily  . apixaban  5 mg Oral BID  . fluticasone  2 spray Each Nare Daily  . sodium chloride flush  3 mL Intravenous Q12H   Continuous Infusions: . sodium chloride 100 mL/hr at 07/31/18 0606  . azithromycin Stopped (07/30/18 1447)  . cefTRIAXone (ROCEPHIN)  IV Stopped (07/30/18 1528)  . diltiazem (CARDIZEM) infusion 15 mg/hr (07/31/18 0734)     LOS: 2 days     Cordelia Poche, MD [facility] 07/31/2018, 8:02 AM Pager: [phone]  If 7PM-7AM, please contact night-coverage [email or web address] 07/31/2018, 8:02 AM

## 2018-07-31 NOTE — Consult Note (Signed)
Reason for Consult: Afib Referring Physician: [facility] hospitalist  JERETT ODONOHUE is an 77 y.o. male.  HPI:   77 y/o [place] male with persistent Afib, oderate CAD (cath 2014), hypertension, hyperlipidemia, h/o TIA, h/o subdural hematoma, prostate Ca, admitted with sepsis, likely source being pneumonia. Cardiolgy were consulted for management of Afib.   Patient has has borderline blood pressure, but not requiring vasopressors, has been on diltiazem ggt. HR 120s for the most part, down to 80s this morning. Leukocytosis and lactic acid level much improved this morning.   At baseline, patient has rate controlled persistent Afib. His home medications include diltiazem 180 mg daily, metoprolol 100 mg bid, spironolactone 60 mg daily, torsemide 20 mg daily.   Past Medical History:  Diagnosis Date  . Alcohol abuse, daily use 08/18/2013  . Arthritis    osteoarthritis  . CAD (coronary artery disease)    Non obstructive  . Cancer [facility]) 1994   prostate  . Diastolic dysfunction Echo 2012   Grade 1  . Gout   . 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 Hx-TIA (transient ischemic attack) 2011  . Hypercholesteremia   . Hypertension     Past Surgical History:  Procedure Laterality Date  . BURR HOLE FOR SUBDURAL HEMATOMA  03/2011  . CARDIOVERSION N/A 02/20/2017   Procedure: CARDIOVERSION;  Surgeon: Adrian Prows, MD;  Location: [place];  Service: Cardiovascular;  Laterality: N/A;  . EYE SURGERY Left 03/2009   cataract  . LEFT HEART CATHETERIZATION WITH CORONARY ANGIOGRAM N/A 01/02/2013   Procedure: LEFT HEART CATHETERIZATION WITH CORONARY ANGIOGRAM;  Surgeon: Laverda Page, MD;  Location: [facility] CATH LAB;  Service: Cardiovascular;  Laterality: N/A;  . PROSTATE SURGERY  1994  . SPINE SURGERY     cervical    Family History  Problem Relation Age of Onset  . Lung cancer Mother        never smoker  . Prostate cancer Father     Social History:  reports that he quit smoking about 5 years ago. His  smoking use included pipe. He quit after 39.00 years of use. He has never used smokeless tobacco. He reports that he drinks about 56.0 standard drinks of alcohol per week. He reports that he does not use drugs.  Allergies: No Known Allergies  Medications: I have reviewed the patient's current medications.  Results for orders placed or performed during the hospital encounter of 07/29/18 (from the past 48 hour(s))  I-stat troponin, ED     Status: None   Collection Time: 07/29/18  9:10 AM  Result Value Ref Range   Troponin i, poc 0.01 0.00 - 0.08 ng/mL   Comment 3            Comment: Due to the release kinetics of cTnI, a negative result within the first hours of the onset of symptoms does not rule out myocardial infarction with certainty. If myocardial infarction is still suspected, repeat the test at appropriate intervals.   Lactic acid, plasma     Status: Abnormal   Collection Time: 07/29/18  9:28 AM  Result Value Ref Range   Lactic Acid, Venous 4.5 (HH) 0.5 - 1.9 mmol/L    Comment: CRITICAL RESULT CALLED TO, READ BACK BY AND VERIFIED WITHTonie Griffith RN AT 3664 07/29/18 MULLINS,T Performed at [facility], [place] [address], [place], [place] [address]   Lactic acid, plasma     Status: Abnormal   Collection Time: 07/29/18 11:50 AM  Result Value Ref Range  Lactic Acid, Venous 3.6 (HH) 0.5 - 1.9 mmol/L    Comment: CRITICAL RESULT CALLED TO, READ BACK BY AND VERIFIED WITHMarjie Skiff RN AT 1252 07/29/18 MULLINS,T Performed at [facility], [place] [address] [place], [place] [address]   Procalcitonin     Status: None   Collection Time: 07/29/18 11:50 AM  Result Value Ref Range   Procalcitonin 59.06 ng/mL    Comment:        Interpretation: PCT >= 10 ng/mL: Important systemic inflammatory response, almost exclusively due to severe bacterial sepsis or septic shock. (NOTE)       Sepsis PCT Algorithm           Lower Respiratory Tract                                       Infection PCT Algorithm    ----------------------------     ----------------------------         PCT < 0.25 ng/mL                PCT < 0.10 ng/mL         Strongly encourage             Strongly discourage   discontinuation of antibiotics    initiation of antibiotics    ----------------------------     -----------------------------       PCT 0.25 - 0.50 ng/mL            PCT 0.10 - 0.25 ng/mL               OR       >80% decrease in PCT            Discourage initiation of                                            antibiotics      Encourage discontinuation           of antibiotics    ----------------------------     -----------------------------         PCT >= 0.50 ng/mL              PCT 0.26 - 0.50 ng/mL                AND       <80% decrease in PCT             Encourage initiation of                                             antibiotics       Encourage continuation           of antibiotics    ----------------------------     -----------------------------        PCT >= 0.50 ng/mL                  PCT > 0.50 ng/mL               AND         increase in PCT  Strongly encourage                                      initiation of antibiotics    Strongly encourage escalation           of antibiotics                                     -----------------------------                                           PCT <= 0.25 ng/mL                                                 OR                                        > 80% decrease in PCT                                     Discontinue / Do not initiate                                             antibiotics Performed at [place] [address], [place], [place] [address]   Urinalysis, Routine w reflex microscopic     Status: Abnormal   Collection Time: 07/29/18  1:28 PM  Result Value Ref Range   Color, Urine AMBER (A) YELLOW    Comment: BIOCHEMICALS MAY BE AFFECTED BY COLOR    APPearance HAZY (A) CLEAR   Specific Gravity, Urine 1.019 1.005 - 1.030   pH 5.0 5.0 - 8.0   Glucose, UA NEGATIVE NEGATIVE mg/dL   Hgb urine dipstick NEGATIVE NEGATIVE   Bilirubin Urine SMALL (A) NEGATIVE   Ketones, ur 5 (A) NEGATIVE mg/dL   Protein, ur NEGATIVE NEGATIVE mg/dL   Nitrite NEGATIVE NEGATIVE   Leukocytes, UA NEGATIVE NEGATIVE    Comment: Performed at [facility], [place] [address], [place], [place] [address]  Influenza panel by PCR (type A & B)     Status: None   Collection Time: 07/29/18  1:51 PM  Result Value Ref Range   Influenza A By PCR NEGATIVE NEGATIVE   Influenza B By PCR NEGATIVE NEGATIVE    Comment: (NOTE) The Xpert Xpress Flu assay is intended as an aid in the diagnosis of  influenza and should not be used as a sole basis for treatment.  This  assay is FDA approved for nasopharyngeal swab specimens only. Nasal  washings and aspirates are unacceptable for Xpert Xpress Flu testing. Performed at [facility], [place] [address] [place], [place] [address]   MRSA PCR Screening     Status: None   Collection Time: 07/29/18  2:54 PM  Result Value  Ref Range   MRSA by PCR NEGATIVE NEGATIVE    Comment:        The GeneXpert MRSA Assay (FDA approved for NASAL specimens only), is one component of a comprehensive MRSA colonization surveillance program. It is not intended to diagnose MRSA infection nor to guide or monitor treatment for MRSA infections. Performed at [facility], [place] [address] [place], [place] [address]   Hemoglobin A1c     Status: Abnormal   Collection Time: 07/29/18  2:55 PM  Result Value Ref Range   Hgb A1c MFr Bld 5.8 (H) 4.8 - 5.6 %    Comment: (NOTE)         Prediabetes: 5.7 - 6.4         Diabetes: >6.4         Glycemic control for adults with diabetes: <7.0    Mean Plasma Glucose 120 mg/dL    Comment: (NOTE) Performed At: [facility] [place], [place]  [id number] Rush Farmer MD LM:[id number]   Magnesium     Status: Abnormal   Collection Time: 07/29/18  2:55 PM  Result Value Ref Range   Magnesium 1.3 (L) 1.7 - 2.4 mg/dL    Comment: Performed at [facility], [place] [address] [place], [place] [address]  Comprehensive metabolic panel     Status: Abnormal   Collection Time: 07/30/18  2:59 AM  Result Value Ref Range   Sodium 138 135 - 145 mmol/L   Potassium 4.6 3.5 - 5.1 mmol/L    Comment: DELTA CHECK NOTED REPEATED TO VERIFY    Chloride 102 98 - 111 mmol/L   CO2 24 22 - 32 mmol/L   Glucose, Bld 121 (H) 70 - 99 mg/dL   BUN 22 8 - 23 mg/dL   Creatinine, Ser 1.22 0.61 - 1.24 mg/dL   Calcium 8.4 (L) 8.9 - 10.3 mg/dL   Total Protein 6.0 (L) 6.5 - 8.1 g/dL   Albumin 3.0 (L) 3.5 - 5.0 g/dL   AST 80 (H) 15 - 41 U/L   ALT 58 (H) 0 - 44 U/L   Alkaline Phosphatase 139 (H) 38 - 126 U/L   Total Bilirubin 6.2 (H) 0.3 - 1.2 mg/dL   GFR calc non Af Amer 55 (L) >60 mL/min   GFR calc Af Amer >60 >60 mL/min    Comment: (NOTE) The eGFR has been calculated using the CKD EPI equation. This calculation has not been validated in all clinical situations. eGFR's persistently <60 mL/min signify possible Chronic Kidney Disease.    Anion gap 12 5 - 15    Comment: Performed at [facility], [place], [place] [address], [place], [place] [address]  CBC WITH DIFFERENTIAL     Status: Abnormal   Collection Time: 07/30/18  2:59 AM  Result Value Ref Range   WBC 24.2 (H) 4.0 - 10.5 K/uL   RBC 4.47 4.22 - 5.81 MIL/uL   Hemoglobin 14.3 13.0 - 17.0 g/dL   HCT 41.8 39.0 - 52.0 %   MCV 93.5 78.0 - 100.0 fL   MCH 32.0 26.0 - 34.0 pg   MCHC 34.2 30.0 - 36.0 g/dL   RDW 14.7 11.5 - 15.5 %   Platelets 123 (L) 150 - 400 K/uL   Neutrophils Relative % 93 %   Neutro Abs 22.6 (H) 1.7 - 7.7 K/uL   Lymphocytes Relative 3 %   Lymphs Abs 0.8 0.7 - 4.0 K/uL   Monocytes Relative 4 %  Monocytes Absolute 0.9 0.1 - 1.0 K/uL   Eosinophils Relative  0 %   Eosinophils Absolute 0.0 0.0 - 0.7 K/uL   Basophils Relative 0 %   Basophils Absolute 0.0 0.0 - 0.1 K/uL    Comment: Performed at [facility], [place] [address], [place], [place] [address]  Magnesium     Status: None   Collection Time: 07/30/18  2:59 AM  Result Value Ref Range   Magnesium 2.0 1.7 - 2.4 mg/dL    Comment: Performed at [facility], [place] [address], [place], [place] [address]  Lactic acid, plasma     Status: Abnormal   Collection Time: 07/30/18  4:59 PM  Result Value Ref Range   Lactic Acid, Venous 2.3 (HH) 0.5 - 1.9 mmol/L    Comment: CRITICAL RESULT CALLED TO, READ BACK BY AND VERIFIED WITH: M.TUCKER AT 1810 ON 07/30/18 BY N.THOMPSON Performed at [facility], [place] [address], [place], [place] [address]   Comprehensive metabolic panel     Status: Abnormal   Collection Time: 07/31/18  3:04 AM  Result Value Ref Range   Sodium 135 135 - 145 mmol/L   Potassium 4.0 3.5 - 5.1 mmol/L   Chloride 104 98 - 111 mmol/L   CO2 22 22 - 32 mmol/L   Glucose, Bld 126 (H) 70 - 99 mg/dL   BUN 15 8 - 23 mg/dL   Creatinine, Ser 0.65 0.61 - 1.24 mg/dL   Calcium 7.6 (L) 8.9 - 10.3 mg/dL   Total Protein 5.3 (L) 6.5 - 8.1 g/dL   Albumin 2.5 (L) 3.5 - 5.0 g/dL   AST 48 (H) 15 - 41 U/L   ALT 37 0 - 44 U/L   Alkaline Phosphatase 111 38 - 126 U/L   Total Bilirubin 6.3 (H) 0.3 - 1.2 mg/dL   GFR calc non Af Amer >60 >60 mL/min   GFR calc Af Amer >60 >60 mL/min    Comment: (NOTE) The eGFR has been calculated using the CKD EPI equation. This calculation has not been validated in all clinical situations. eGFR's persistently <60 mL/min signify possible Chronic Kidney Disease.    Anion gap 9 5 - 15    Comment: Performed at [facility], [place] [address], [place], [place] [address]  CBC with Differential/Platelet     Status: Abnormal   Collection Time: 07/31/18  3:04 AM  Result Value Ref Range   WBC 13.4 (H) 4.0 -  10.5 K/uL    Comment: WHITE COUNT CONFIRMED ON SMEAR   RBC 4.57 4.22 - 5.81 MIL/uL   Hemoglobin 14.5 13.0 - 17.0 g/dL   HCT 42.5 39.0 - 52.0 %   MCV 93.0 78.0 - 100.0 fL   MCH 31.7 26.0 - 34.0 pg   MCHC 34.1 30.0 - 36.0 g/dL   RDW 15.0 11.5 - 15.5 %   Platelets 84 (L) 150 - 400 K/uL    Comment: REPEATED TO VERIFY SPECIMEN CHECKED FOR CLOTS PLATELET COUNT CONFIRMED BY SMEAR    Neutrophils Relative % 89 %   Lymphocytes Relative 4 %   Monocytes Relative 7 %   Eosinophils Relative 0 %   Basophils Relative 0 %   Neutro Abs 12.0 (H) 1.7 - 7.7 K/uL   Lymphs Abs 0.5 (L) 0.7 - 4.0 K/uL   Monocytes Absolute 0.9 0.1 - 1.0 K/uL   Eosinophils Absolute 0.0 0.0 - 0.7 K/uL   Basophils Absolute 0.0 0.0 - 0.1 K/uL   WBC  Morphology VACUOLATED NEUTROPHILS     Comment: DOHLE BODIES Performed at [facility], [place] [address], [place], [place] [address]     Dg Chest 1 View  Result Date: 07/29/2018 CLINICAL DATA:  Shortness of breath, hypertension EXAM: CHEST  1 VIEW COMPARISON:  09/30/2017 FINDINGS: Low volumes with bibasilar atelectasis. Heart is borderline in size. No effusions or acute bony abnormality. IMPRESSION: Low lung volumes with bibasilar atelectasis. Electronically Signed   By: Rolm Baptise M.D.   On: 07/29/2018 09:19   X-ray Chest Pa And Lateral  Result Date: 07/30/2018 CLINICAL DATA:  77 year old male with a diagnosis of pneumonia EXAM: CHEST - 2 VIEW COMPARISON:  Prior chest x-ray yesterday 07/29/2018 FINDINGS: Stable cardiac and mediastinal contours with borderline cardiomegaly. Atherosclerotic calcifications again noted in the transverse aorta. Mild patchy and linear airspace opacities in the lingula and left lower lobe. No pleural effusion, pneumothorax or pulmonary edema. The right lung is clear. Visualized upper abdominal bowel gas pattern is unremarkable. No acute osseous abnormality. Incompletely imaged cervicothoracic anterior stabilization hardware. IMPRESSION:  1. Mild patchy and linear airspace opacities in the lingula and left lower lobe may reflect bronchopneumonia or acute on chronic bronchitis. 2. Stable mild cardiomegaly. 3.  Aortic Atherosclerosis (ICD10-170.0) Electronically Signed   By: Jacqulynn Cadet M.D.   On: 07/30/2018 09:24   Ct Chest Wo Contrast  Result Date: 07/30/2018 CLINICAL DATA:  Shortness of breath. History of hypertension, prostate cancer. EXAM: CT CHEST WITHOUT CONTRAST TECHNIQUE: Multidetector CT imaging of the chest was performed following the standard protocol without IV contrast. COMPARISON:  Chest radiograph July 30, 2018 and CT chest September 26, 2017 FINDINGS: CARDIOVASCULAR: The heart is mildly enlarged. Severe coronary artery calcifications. No pericardial effusion. Thoracic aorta is normal course and caliber, mild calcific atherosclerosis. MEDIASTINUM/NODES: No mediastinal mass. No lymphadenopathy by CT size criteria. Normal appearance of thoracic esophagus though not tailored for evaluation. LUNGS/PLEURA: Tracheobronchial tree is patent, no pneumothorax. Mild bronchial wall thickening. Minimal pleural effusions. RIGHT lower lobe consolidation with air bronchograms. Punctate RIGHT lower lobe calcifications. Mild lower lobe bronchiectasis. Stable 3 mm subsolid LEFT upper lobe subpleural nodule. UPPER ABDOMEN: Elevated RIGHT hemidiaphragm. Nodular cirrhotic liver. Distended gallbladder with mild suspected wall edema, similar to prior CT. MUSCULOSKELETAL: Nonacute. ACDF. Severe RIGHT acromioclavicular osteoarthrosis. IMPRESSION: 1. Minimal pleural effusions and RIGHT lower lobe atelectasis and/or pneumonia. 2. Cirrhosis. Mild gallbladder distension and edema appear chronic though if there are referable symptoms consider RIGHT upper quadrant ultrasound. Aortic Atherosclerosis (ICD10-I70.0). Electronically Signed   By: Elon Alas M.D.   On: 07/30/2018 17:38   Dg Chest Port 1 View  Result Date: 07/30/2018 CLINICAL DATA:   Worsening right-sided chest pain since this morning. History of coronary artery disease, sepsis, pneumonia. EXAM: PORTABLE CHEST 1 VIEW COMPARISON:  PA and lateral chest x-ray of May 29, 2018. FINDINGS: The lungs are mildly hypoinflated. The right hemidiaphragm is slightly higher than the left. There is patchy density at the right lung base. There is hazy increased density obscuring the lateral aspect of the left hemidiaphragm. The cardiac silhouette is enlarged. The pulmonary vascularity is normal. There is calcification in the wall of the aortic arch. The bony thorax exhibits no acute abnormality. IMPRESSION: Bibasilar atelectasis or pneumonia not greatly changed from the earlier study. Enlargement of the cardiac silhouette without pulmonary vascular congestion. Thoracic aortic atherosclerosis. Electronically Signed   By: David  Martinique M.D.   On: 07/30/2018 13:20   US Abdomen Limited Ruq  Result Date: 07/30/2018 CLINICAL DATA:  Abnormal liver function studies EXAM: ULTRASOUND ABDOMEN LIMITED RIGHT UPPER QUADRANT COMPARISON:  Right upper quadrant abdominal ultrasound of September 26, 2017 FINDINGS: Gallbladder: No gallstones or wall thickening visualized. No sonographic Murphy sign noted by sonographer. Common bile duct: Diameter: 4.2 mm Liver: The hepatic echotexture is heterogeneous. The surface contour of the liver remains smooth. There is no focal mass nor ductal dilation. Portal vein is patent on color Doppler imaging with normal direction of blood flow towards the liver. IMPRESSION: Heterogeneous hepatic echotexture may reflect hepatocellular disease. There is no discrete mass nor ductal dilation however. Normal appearance of the gallbladder and common bile duct. Electronically Signed   By: David  Martinique M.D.   On: 07/30/2018 14:48    Cardiac studies: EKG 07/30/2018: Afib w/RVR. LAD. Old inferior infarct. No acute ischemic changes  Echocardiogram 10/01/2017: Study Conclusions  - Left ventricle:  The cavity size was normal. There was mild   concentric hypertrophy. Systolic function was normal. The   estimated ejection fraction was in the range of 50% to 55%.   Regional wall motion abnormalities cannot be excluded. The study   is not technically sufficient to allow evaluation of LV diastolic   function. - Left atrium: The atrium was moderately dilated. - Right ventricle: The cavity size was mildly dilated. Wall   thickness was normal. - Atrial septum: No defect or patent foramen ovale was identified. - Pulmonary arteries: Systolic pressure was mildly increased. PA   peak pressure: 32 mm Hg (S).   Review of Systems  Constitutional: Positive for fever and malaise/fatigue.  HENT: Positive for congestion.   Eyes: Negative.   Respiratory: Positive for cough, sputum production and shortness of breath.   Cardiovascular: Negative for chest pain.  Gastrointestinal: Negative.   Genitourinary: Negative.   Musculoskeletal: Positive for back pain.  Neurological: Negative for dizziness and loss of consciousness.  Psychiatric/Behavioral: Negative.   All other systems reviewed and are negative.  Blood pressure (!) 93/55, pulse 100, temperature (!) 97.5 F (36.4 C), temperature source Oral, resp. rate (!) 25, height 5' 9"  (1.753 m), weight 82.9 kg, SpO2 95 %. Physical Exam  Nursing note and vitals reviewed. Constitutional: He is oriented to person, place, and time. He appears well-developed and well-nourished. No distress.  HENT:  Head: Normocephalic and atraumatic.  Eyes: Pupils are equal, round, and reactive to light. Conjunctivae are normal.  Neck: No JVD present.  Cardiovascular: Intact distal pulses.  No murmur heard. Irregularly irregular, tachycardic  Respiratory: Effort normal.  Bibasilar rhonchi  GI: Soft. Bowel sounds are normal. There is tenderness. There is no rebound.  Musculoskeletal: He exhibits no edema.  Neurological: He is alert and oriented to person, place, and  time. No cranial nerve deficit.  Skin: Skin is warm and dry.  Psychiatric: He has a normal mood and affect.    Assessment:  77 y/o [place] male with persistent Afib, oderate CAD (cath 2014), hypertension, hyperlipidemia, h/o TIA, h/o subdural hematoma, prostate Ca, admitted with sepsis, likely source being pneumonia. Cardiolgy were consulted for management of Afib.   A. fib with RVR:  CHA2DS2VAsc score 3, annual stroke risk 3.2%  RVR is response to patient's underlying sepsis, and not being on baseline home medications owing to low blood pressure in the setting of sepsis. Sepsis, possible source pneumonia History of hypertension Hyper lipidemia Moderate coronary artery disease History of TIA, subdural hematoma H/o prostate cancer  Recommendations:  Continue diltiazem drip. Resume metoprolol tartrate at lower dose, 25 mg twice daily. Wean off  diltiazem drip as tolerated. I expect his ventricular rate to improve as his sepsis improves and home medications are resumed. It is also possible that he had supply demand mismatch in the setting of A. fib RVR and sepsis. No other workup and investigations needed at this time. Will obtain echocardiogram on patient when his rate is better controlled.  We will follow the patient's chart routinely. Please call us back if any questions.  Charlisa Cham J Daneli Butkiewicz 07/31/2018, 9:05 AM   [place], MD [facility] [place] Pager: [phone] Office: [phone] If no answer Cell [phone]

## 2018-08-01 ENCOUNTER — Inpatient Hospital Stay (HOSPITAL_COMMUNITY): Payer: Medicare Other

## 2018-08-01 DIAGNOSIS — K7030 Alcoholic cirrhosis of liver without ascites: Secondary | ICD-10-CM

## 2018-08-01 DIAGNOSIS — J9601 Acute respiratory failure with hypoxia: Secondary | ICD-10-CM

## 2018-08-01 DIAGNOSIS — K7031 Alcoholic cirrhosis of liver with ascites: Secondary | ICD-10-CM

## 2018-08-01 HISTORY — DX: Alcoholic cirrhosis of liver with ascites: K70.31

## 2018-08-01 LAB — AMYLASE, PLEURAL OR PERITONEAL FLUID: AMYLASE FL: 597 U/L

## 2018-08-01 LAB — CBC WITH DIFFERENTIAL/PLATELET
BASOS ABS: 0 10*3/uL (ref 0.0–0.1)
Basophils Relative: 0 %
EOS PCT: 0 %
Eosinophils Absolute: 0.1 10*3/uL (ref 0.0–0.7)
HCT: 39.9 % (ref 39.0–52.0)
Hemoglobin: 13.9 g/dL (ref 13.0–17.0)
Lymphocytes Relative: 5 %
Lymphs Abs: 0.6 10*3/uL — ABNORMAL LOW (ref 0.7–4.0)
MCH: 32 pg (ref 26.0–34.0)
MCHC: 34.8 g/dL (ref 30.0–36.0)
MCV: 91.7 fL (ref 78.0–100.0)
MONO ABS: 1.2 10*3/uL — AB (ref 0.1–1.0)
Monocytes Relative: 9 %
Neutro Abs: 11 10*3/uL — ABNORMAL HIGH (ref 1.7–7.7)
Neutrophils Relative %: 86 %
PLATELETS: 87 10*3/uL — AB (ref 150–400)
RBC: 4.35 MIL/uL (ref 4.22–5.81)
RDW: 14.9 % (ref 11.5–15.5)
WBC: 12.9 10*3/uL — ABNORMAL HIGH (ref 4.0–10.5)

## 2018-08-01 LAB — CULTURE, BLOOD (ROUTINE X 2): Special Requests: ADEQUATE

## 2018-08-01 LAB — COMPREHENSIVE METABOLIC PANEL
ALBUMIN: 2.5 g/dL — AB (ref 3.5–5.0)
ALK PHOS: 103 U/L (ref 38–126)
ALT: 28 U/L (ref 0–44)
AST: 25 U/L (ref 15–41)
Anion gap: 9 (ref 5–15)
BILIRUBIN TOTAL: 5.6 mg/dL — AB (ref 0.3–1.2)
BUN: 16 mg/dL (ref 8–23)
CALCIUM: 7.9 mg/dL — AB (ref 8.9–10.3)
CO2: 24 mmol/L (ref 22–32)
CREATININE: 0.78 mg/dL (ref 0.61–1.24)
Chloride: 101 mmol/L (ref 98–111)
GFR calc Af Amer: >60 mL/min (ref >60)
GFR calc non Af Amer: >60 mL/min (ref >60)
GLUCOSE: 116 mg/dL — AB (ref 70–99)
Potassium: 4 mmol/L (ref 3.5–5.1)
Sodium: 134 mmol/L — ABNORMAL LOW (ref 135–145)
TOTAL PROTEIN: 5.2 g/dL — AB (ref 6.5–8.1)

## 2018-08-01 LAB — AMMONIA: Ammonia: 23 umol/L (ref 9–35)

## 2018-08-01 LAB — ALBUMIN, PLEURAL OR PERITONEAL FLUID: ALBUMIN FL: 1.1 g/dL

## 2018-08-01 LAB — BODY FLUID CELL COUNT WITH DIFFERENTIAL
Eos, Fluid: 0 %
Lymphs, Fluid: 3 %
Monocyte-Macrophage-Serous Fluid: 3 % — ABNORMAL LOW (ref 50–90)
Neutrophil Count, Fluid: 94 % — ABNORMAL HIGH (ref 0–25)
WBC FLUID: 49335 uL — AB (ref 0–1000)

## 2018-08-01 LAB — GLUCOSE, PLEURAL OR PERITONEAL FLUID: Glucose, Fluid: 141 mg/dL

## 2018-08-01 LAB — URINE CULTURE: Culture: NO GROWTH

## 2018-08-01 LAB — LACTATE DEHYDROGENASE, PLEURAL OR PERITONEAL FLUID: LD FL: 460 U/L — AB (ref 3–23)

## 2018-08-01 LAB — GRAM STAIN

## 2018-08-01 LAB — PROTIME-INR
INR: 1.62
Prothrombin Time: 19.1 seconds — ABNORMAL HIGH (ref 11.4–15.2)

## 2018-08-01 LAB — PROTEIN, PLEURAL OR PERITONEAL FLUID: Total protein, fluid: <3 g/dL

## 2018-08-01 MED ORDER — ORAL CARE MOUTH RINSE
15.0000 mL | Freq: Two times a day (BID) | OROMUCOSAL | Status: DC
  Administered 2018-08-01 – 2018-08-10 (×12): 15 mL via OROMUCOSAL

## 2018-08-01 MED ORDER — SODIUM CHLORIDE 0.9 % IV SOLN
INTRAVENOUS | Status: DC | PRN
  Administered 2018-08-01 – 2018-08-03 (×2): 250 mL via INTRAVENOUS

## 2018-08-01 MED ORDER — DILTIAZEM HCL ER COATED BEADS 120 MG PO CP24
120.0000 mg | ORAL_CAPSULE | Freq: Every day | ORAL | Status: DC
  Administered 2018-08-01 – 2018-08-10 (×10): 120 mg via ORAL
  Filled 2018-08-01 (×10): qty 1

## 2018-08-01 MED ORDER — METOPROLOL TARTRATE 50 MG PO TABS
50.0000 mg | ORAL_TABLET | Freq: Two times a day (BID) | ORAL | Status: DC
  Administered 2018-08-01 – 2018-08-04 (×7): 50 mg via ORAL
  Filled 2018-08-01 (×3): qty 2
  Filled 2018-08-01 (×2): qty 1
  Filled 2018-08-01 (×2): qty 2

## 2018-08-01 MED ORDER — FUROSEMIDE 10 MG/ML IJ SOLN
20.0000 mg | Freq: Once | INTRAMUSCULAR | Status: AC
  Administered 2018-08-01: 20 mg via INTRAVENOUS
  Filled 2018-08-01: qty 2

## 2018-08-01 NOTE — Progress Notes (Signed)
PROGRESS NOTE    Samuel Flores  OBS:[id number] DOB: 06-27-1941 DOA: 07/29/2018 PCP: Alycia Rossetti, MD   Brief Narrative: Samuel Flores is a 77 y.o. male with a history of atrial fibrillation, alcohol abuse. He presented secondary to feeling weak and found to have concern for sepsis secondary to e. Coli bacteremia, in addition to atrial fibrillation with RVR. He is being treated with IV antibiotics.   Assessment & Plan:   Active Problems:   Hypotension   Sepsis ([place])   Pneumonia due to infectious organism   Alcoholic cirrhosis of liver with ascites ([facility])   Atrial fibrillation with RVR ([name])   Sepsis Secondary to E. Coli bacteremia. Still with tachycardia in setting of atrial fibrillation -Continue Unasyn  E. Coli bacteremia Unknown source. -Management above  Atrial fibrillation with RVR In setting of sepsis. Complicated by mild hypotension. Cardiology consulted. Still significantly tachycardic -Continue Cardizem drip -Increase to metoprolol 50 mg BID as his blood pressure is better tolerating medication -Cardiology recommendations -Eliquis  Possible RLL pneumonia -Continue Unasyn/azithromycin  Acute respiratory failure with hypoxia In setting of possible pneumonia. Mild pleural effusion. -Wean oxygen to room air  Acute kidney injury Baseline of 1. Up to 1.65 on admission. Resolved.  History of prostate cancer  Gout -Continue allopurinol  Ascites In setting of cirrhosis, likely alcoholic. Patient is uncomfortable. -US paracentesis with labs  Cirrhosis Likely alcoholic. Likely acute hepatitis on admission. Patient denies alcohol intake for the last 10 months so alcohol hepatitis unlikely. No alcohol level available from admission. AST/ALT improved -Hepatitis panel -Repeat PT/INR  Elevated alkaline phosphatase Initial concern for possible cholecystitis. RUQ ultrasound without cholelithiasis, cholecystitis or ductal dilation.    DVT prophylaxis:  Eliquis Code Status:   Code Status: DNR Family Communication: None at bedside Disposition Plan: Discharge pending improvement of atrial fibrillation and transition to oral antibiotics for bacteremia   Consultants:   9/17: Cardiology, Dr. [name]  Procedures:   None  Antimicrobials:  Vancomycin  Zosyn  Ceftriaxone  Azithromycin   Unasyn   Subjective: Abdominal pain. Nausea this morning without emesis.  Objective: Vitals:   08/01/18 0500 08/01/18 0600 08/01/18 0700 08/01/18 0712  BP: (!) 127/54 128/60 124/87   Pulse: (!) 144 70 (!) 39   Resp: (!) 28 (!) 29 19   Temp:    98.1 F (36.7 C)  TempSrc:    Oral  SpO2: 95% 93% (!) 79%   Weight:      Height:        Intake/Output Summary (Last 24 hours) at 08/01/2018 0723 Last data filed at 07/31/2018 1907 Gross per 24 hour  Intake 1728.29 ml  Output 600 ml  Net 1128.29 ml   Filed Weights   07/29/18 1432  Weight: 82.9 kg    Examination:  General exam: Appears calm and comfortable Respiratory system: Clear to auscultation. Respiratory effort normal. Cardiovascular system: S1 & S2 heard, fast rate with irregular rhythm. No murmurs, rubs, gallops or clicks. Gastrointestinal system: Abdomen is distended, soft and mildly tender especially in LUQ. Diminished bowel sounds. Central nervous system: Alert and oriented. No focal neurological deficits. Extremities: No edema. No calf tenderness Skin: No cyanosis. No rashes Psychiatry: Judgement and insight appear normal. Mood & affect appropriate.     Data Reviewed: I have personally reviewed following labs and imaging studies  CBC: Recent Labs  Lab 07/29/18 0904 07/30/18 0259 07/31/18 0304 08/01/18 0329  WBC 28.8* 24.2* 13.4* 12.9*  NEUTROABS 26.6* 22.6* 12.0* 11.0*  HGB 15.9 14.3  14.5 13.9  HCT 44.6 41.8 42.5 39.9  MCV 92.0 93.5 93.0 91.7  PLT 152 123* 84* 87*   Basic Metabolic Panel: Recent Labs  Lab 07/29/18 0904 07/29/18 1455 07/30/18 0259  07/31/18 0304 08/01/18 0329  NA 137  --  138 135 134*  K 3.6  --  4.6 4.0 4.0  CL 100  --  102 104 101  CO2 21*  --  24 22 24   GLUCOSE 144*  --  121* 126* 116*  BUN 29*  --  22 15 16   CREATININE 1.65*  --  1.22 0.65 0.78  CALCIUM 9.0  --  8.4* 7.6* 7.9*  MG  --  1.3* 2.0  --   --    GFR: Estimated Creatinine Clearance: 74.9 mL/min (by C-G formula based on SCr of 0.78 mg/dL). Liver Function Tests: Recent Labs  Lab 07/29/18 0904 07/30/18 0259 07/31/18 0304 08/01/18 0329  AST 97* 80* 48* 25  ALT 54* 58* 37 28  ALKPHOS 172* 139* 111 103  BILITOT 5.3* 6.2* 6.3* 5.6*  PROT 6.2* 6.0* 5.3* 5.2*  ALBUMIN 3.3* 3.0* 2.5* 2.5*   Recent Labs  Lab 07/29/18 0904  LIPASE 28   No results for input(s): AMMONIA in the last 168 hours. Coagulation Profile: Recent Labs  Lab 07/29/18 0904  INR 1.64   Cardiac Enzymes: Recent Labs  Lab 07/29/18 0904  TROPONINI 0.04*   BNP (last 3 results) No results for input(s): PROBNP in the last 8760 hours. HbA1C: Recent Labs    07/29/18 1455  HGBA1C 5.8*   CBG: No results for input(s): GLUCAP in the last 168 hours. Lipid Profile: No results for input(s): CHOL, HDL, LDLCALC, TRIG, CHOLHDL, LDLDIRECT in the last 72 hours. Thyroid Function Tests: No results for input(s): TSH, T4TOTAL, FREET4, T3FREE, THYROIDAB in the last 72 hours. Anemia Panel: No results for input(s): VITAMINB12, FOLATE, FERRITIN, TIBC, IRON, RETICCTPCT in the last 72 hours. Sepsis Labs: Recent Labs  Lab 07/29/18 0928 07/29/18 1150 07/30/18 1659  PROCALCITON  --  59.06  --   LATICACIDVEN 4.5* 3.6* 2.3*    Recent Results (from the past 240 hour(s))  Culture, blood (routine x 2)     Status: None (Preliminary result)   Collection Time: 07/29/18  9:04 AM  Result Value Ref Range Status   Specimen Description   Final    BLOOD LEFT ANTECUBITAL Performed at [facility], [place] [address], [place], [place] [address]    Special Requests   Final     BOTTLES DRAWN AEROBIC AND ANAEROBIC Blood Culture adequate volume Performed at [place] [address], [place], [place] [address]    Culture  Setup Time   Final    GRAM NEGATIVE RODS IN BOTH AEROBIC AND ANAEROBIC BOTTLES CRITICAL VALUE NOTED.  VALUE IS CONSISTENT WITH PREVIOUSLY REPORTED AND CALLED VALUE. Performed at [facility], [place] [address], [place], [place] [address]    Culture GRAM NEGATIVE RODS  Final   Report Status PENDING  Incomplete  Culture, blood (routine x 2)     Status: Abnormal   Collection Time: 07/29/18  9:04 AM  Result Value Ref Range Status   Specimen Description   Final    BLOOD BLOOD RIGHT FOREARM Performed at [place] [address], [place], [place] [address]    Special Requests   Final    BOTTLES DRAWN AEROBIC AND ANAEROBIC Blood Culture results may not be optimal due to an excessive volume  of blood received in culture bottles Performed at [facility], [place] [address], [place], [place] [address]    Culture  Setup Time   Final    IN BOTH AEROBIC AND ANAEROBIC BOTTLES GRAM NEGATIVE RODS CRITICAL RESULT CALLED TO, READ BACK BY AND VERIFIED WITHLavell Luster PHARMD [id number] 07/30/18 A BROWNING Performed at [facility], [place] [address], [place], [place] [address]    Culture ESCHERICHIA COLI (A)  Final   Report Status 07/31/2018 FINAL  Final   Organism ID, Bacteria ESCHERICHIA COLI  Final      Susceptibility   Escherichia coli - MIC*    AMPICILLIN <=2 SENSITIVE Sensitive     CEFAZOLIN <=4 SENSITIVE Sensitive     CEFEPIME <=1 SENSITIVE Sensitive     CEFTAZIDIME <=1 SENSITIVE Sensitive     CEFTRIAXONE <=1 SENSITIVE Sensitive     CIPROFLOXACIN <=0.25 SENSITIVE Sensitive     GENTAMICIN <=1 SENSITIVE Sensitive     IMIPENEM <=0.25 SENSITIVE Sensitive     TRIMETH/SULFA <=20 SENSITIVE Sensitive     AMPICILLIN/SULBACTAM <=2 SENSITIVE Sensitive     PIP/TAZO <=4 SENSITIVE Sensitive      Extended ESBL NEGATIVE Sensitive     * ESCHERICHIA COLI  Blood Culture ID Panel (Reflexed)     Status: Abnormal   Collection Time: 07/29/18  9:04 AM  Result Value Ref Range Status   Enterococcus species NOT DETECTED NOT DETECTED Final   Listeria monocytogenes NOT DETECTED NOT DETECTED Final   Staphylococcus species NOT DETECTED NOT DETECTED Final   Staphylococcus aureus NOT DETECTED NOT DETECTED Final   Streptococcus species NOT DETECTED NOT DETECTED Final   Streptococcus agalactiae NOT DETECTED NOT DETECTED Final   Streptococcus pneumoniae NOT DETECTED NOT DETECTED Final   Streptococcus pyogenes NOT DETECTED NOT DETECTED Final   Acinetobacter baumannii NOT DETECTED NOT DETECTED Final   Enterobacteriaceae species DETECTED (A) NOT DETECTED Final    Comment: Enterobacteriaceae represent a large family of gram-negative bacteria, not a single organism. CRITICAL RESULT CALLED TO, READ BACK BY AND VERIFIED WITH: Lavell Luster PHARMD [id number] 07/30/18 A BROWNING    Enterobacter cloacae complex NOT DETECTED NOT DETECTED Final   Escherichia coli DETECTED (A) NOT DETECTED Final    Comment: CRITICAL RESULT CALLED TO, READ BACK BY AND VERIFIED WITH: Lavell Luster PHARMD [id number] 07/30/18 A BROWNING    Klebsiella oxytoca NOT DETECTED NOT DETECTED Final   Klebsiella pneumoniae NOT DETECTED NOT DETECTED Final   Proteus species NOT DETECTED NOT DETECTED Final   Serratia marcescens NOT DETECTED NOT DETECTED Final   Carbapenem resistance NOT DETECTED NOT DETECTED Final   Haemophilus influenzae NOT DETECTED NOT DETECTED Final   Neisseria meningitidis NOT DETECTED NOT DETECTED Final   Pseudomonas aeruginosa NOT DETECTED NOT DETECTED Final   Candida albicans NOT DETECTED NOT DETECTED Final   Candida glabrata NOT DETECTED NOT DETECTED Final   Candida krusei NOT DETECTED NOT DETECTED Final   Candida parapsilosis NOT DETECTED NOT DETECTED Final   Candida tropicalis NOT DETECTED NOT DETECTED Final    Comment: Performed  at [facility], [place] [address] [place], [place] [address]  MRSA PCR Screening     Status: None   Collection Time: 07/29/18  2:54 PM  Result Value Ref Range Status   MRSA by PCR NEGATIVE NEGATIVE Final    Comment:        The GeneXpert MRSA Assay (FDA approved for NASAL specimens only), is one component of a comprehensive MRSA colonization surveillance  program. It is not intended to diagnose MRSA infection nor to guide or monitor treatment for MRSA infections. Performed at [facility], [place] [address], [place], [place] [address]          Radiology Studies: Ct Abdomen Pelvis Wo Contrast  Result Date: 07/31/2018 CLINICAL DATA:  Diffuse abdominal pain and distension today. EXAM: CT ABDOMEN AND PELVIS WITHOUT CONTRAST TECHNIQUE: Multidetector CT imaging of the abdomen and pelvis was performed following the standard protocol without IV contrast. COMPARISON:  Abdominal radiographs July 31, 2018 and CT chest July 30, 2018. FINDINGS: LOWER CHEST: Small bilateral pleural effusions with worsening underlying consolidation and punctate calcifications. Lingular atelectasis. Mild bibasilar bronchial wall thickening. Heart size is mildly enlarged. Severe coronary artery calcification. Small pericardial effusion. HEPATOBILIARY: Nodular hepatic contour, mildly enlarged caudate lobe and LEFT lobe of the liver. Distended gallbladder with mild gallbladder wall thickening. PANCREAS: Normal. SPLEEN: Normal. ADRENALS/URINARY TRACT: Kidneys are orthotopic, demonstrating normal size and morphology. No nephrolithiasis, hydronephrosis; limited assessment for renal masses by nonenhanced CT. The unopacified ureters are normal in course and caliber. Urinary bladder decompressed by Foley catheter. Normal adrenal glands. STOMACH/BOWEL: The stomach, small and large bowel are normal in course and caliber without inflammatory changes, sensitivity decreased by lack of enteric contrast. Mild  colonic diverticulosis. Normal appendix. VASCULAR/LYMPHATIC: Aortoiliac vessels are normal in course and caliber. Moderate calcific atherosclerosis. No lymphadenopathy by CT size criteria. Greater than expected number small lymph nodes in upper abdomen. REPRODUCTIVE: Status post prostatectomy. OTHER: Small to moderate volume ascites. Somewhat matted appearance of the omentum LEFT abdomen (for example, series 2, image 54). Single subcentimeter bubble of gas adjacent to the duodenum (series 2, image 47), posterior to the common bile duct. Small bilateral fat containing inguinal hernias. MUSCULOSKELETAL: Non-acute. Severe L5-S1 degenerative disc. Grade 1 L4-5 anterolisthesis without spondylolysis. Osteopenia and Schmorl's nodes. IMPRESSION: 1. Possible acute cholecystitis though, given cirrhosis and small to moderate volume ascites this may be reactive. Ultrasound may be of added value. 2. Matted appearance to the omentum LEFT abdomen, carcinomatosis not excluded. 3. Single bubble of gas adjacent to the duodenum, possible diverticulum, less likely extraluminal. 4. Small pleural effusions with worsening bilateral lower lobe atelectasis versus pneumonia. Aortic Atherosclerosis (ICD10-I70.0). Electronically Signed   By: Elon Alas M.D.   On: 07/31/2018 17:04   X-ray Chest Pa And Lateral  Result Date: 07/30/2018 CLINICAL DATA:  77 year old male with a diagnosis of pneumonia EXAM: CHEST - 2 VIEW COMPARISON:  Prior chest x-ray yesterday 07/29/2018 FINDINGS: Stable cardiac and mediastinal contours with borderline cardiomegaly. Atherosclerotic calcifications again noted in the transverse aorta. Mild patchy and linear airspace opacities in the lingula and left lower lobe. No pleural effusion, pneumothorax or pulmonary edema. The right lung is clear. Visualized upper abdominal bowel gas pattern is unremarkable. No acute osseous abnormality. Incompletely imaged cervicothoracic anterior stabilization hardware.  IMPRESSION: 1. Mild patchy and linear airspace opacities in the lingula and left lower lobe may reflect bronchopneumonia or acute on chronic bronchitis. 2. Stable mild cardiomegaly. 3.  Aortic Atherosclerosis (ICD10-170.0) Electronically Signed   By: Jacqulynn Cadet M.D.   On: 07/30/2018 09:24   Ct Chest Wo Contrast  Result Date: 07/30/2018 CLINICAL DATA:  Shortness of breath. History of hypertension, prostate cancer. EXAM: CT CHEST WITHOUT CONTRAST TECHNIQUE: Multidetector CT imaging of the chest was performed following the standard protocol without IV contrast. COMPARISON:  Chest radiograph July 30, 2018 and CT chest September 26, 2017 FINDINGS: CARDIOVASCULAR: The heart is mildly enlarged. Severe coronary artery  calcifications. No pericardial effusion. Thoracic aorta is normal course and caliber, mild calcific atherosclerosis. MEDIASTINUM/NODES: No mediastinal mass. No lymphadenopathy by CT size criteria. Normal appearance of thoracic esophagus though not tailored for evaluation. LUNGS/PLEURA: Tracheobronchial tree is patent, no pneumothorax. Mild bronchial wall thickening. Minimal pleural effusions. RIGHT lower lobe consolidation with air bronchograms. Punctate RIGHT lower lobe calcifications. Mild lower lobe bronchiectasis. Stable 3 mm subsolid LEFT upper lobe subpleural nodule. UPPER ABDOMEN: Elevated RIGHT hemidiaphragm. Nodular cirrhotic liver. Distended gallbladder with mild suspected wall edema, similar to prior CT. MUSCULOSKELETAL: Nonacute. ACDF. Severe RIGHT acromioclavicular osteoarthrosis. IMPRESSION: 1. Minimal pleural effusions and RIGHT lower lobe atelectasis and/or pneumonia. 2. Cirrhosis. Mild gallbladder distension and edema appear chronic though if there are referable symptoms consider RIGHT upper quadrant ultrasound. Aortic Atherosclerosis (ICD10-I70.0). Electronically Signed   By: Elon Alas M.D.   On: 07/30/2018 17:38   Dg Chest Port 1 View  Result Date:  07/30/2018 CLINICAL DATA:  Worsening right-sided chest pain since this morning. History of coronary artery disease, sepsis, pneumonia. EXAM: PORTABLE CHEST 1 VIEW COMPARISON:  PA and lateral chest x-ray of May 29, 2018. FINDINGS: The lungs are mildly hypoinflated. The right hemidiaphragm is slightly higher than the left. There is patchy density at the right lung base. There is hazy increased density obscuring the lateral aspect of the left hemidiaphragm. The cardiac silhouette is enlarged. The pulmonary vascularity is normal. There is calcification in the wall of the aortic arch. The bony thorax exhibits no acute abnormality. IMPRESSION: Bibasilar atelectasis or pneumonia not greatly changed from the earlier study. Enlargement of the cardiac silhouette without pulmonary vascular congestion. Thoracic aortic atherosclerosis. Electronically Signed   By: David  Martinique M.D.   On: 07/30/2018 13:20   Dg Abd Portable 1v  Result Date: 07/31/2018 CLINICAL DATA:  Abdominal pain EXAM: PORTABLE ABDOMEN - 1 VIEW COMPARISON:  07/30/2018 FINDINGS: Scattered large and small bowel gas is noted. The small bowel loops in the mid abdomen are minimally prominent. This may be related to an underlying ileus. No free air is noted. No mass lesion is seen. Degenerative changes of lumbar spine are noted. IMPRESSION: Mild prominence of small bowel gas pattern. Correlation with the physical exam is recommended. CT may be helpful for further evaluation. Electronically Signed   By: Inez Catalina M.D.   On: 07/31/2018 11:56   US Abdomen Limited Ruq  Result Date: 07/30/2018 CLINICAL DATA:  Abnormal liver function studies EXAM: ULTRASOUND ABDOMEN LIMITED RIGHT UPPER QUADRANT COMPARISON:  Right upper quadrant abdominal ultrasound of September 26, 2017 FINDINGS: Gallbladder: No gallstones or wall thickening visualized. No sonographic Murphy sign noted by sonographer. Common bile duct: Diameter: 4.2 mm Liver: The hepatic echotexture is  heterogeneous. The surface contour of the liver remains smooth. There is no focal mass nor ductal dilation. Portal vein is patent on color Doppler imaging with normal direction of blood flow towards the liver. IMPRESSION: Heterogeneous hepatic echotexture may reflect hepatocellular disease. There is no discrete mass nor ductal dilation however. Normal appearance of the gallbladder and common bile duct. Electronically Signed   By: David  Martinique M.D.   On: 07/30/2018 14:48        Scheduled Meds: . allopurinol  200 mg Oral Daily  . apixaban  5 mg Oral BID  . fluticasone  2 spray Each Nare Daily  . mouth rinse  15 mL Mouth Rinse BID  . metoprolol tartrate  25 mg Oral BID  . sodium chloride flush  3 mL Intravenous Q12H  Continuous Infusions: . ampicillin-sulbactam (UNASYN) IV Stopped (08/01/18 0550)  . azithromycin Stopped (07/31/18 1441)  . diltiazem (CARDIZEM) infusion 15 mg/hr (08/01/18 0706)     LOS: 3 days     Cordelia Poche, MD [facility] 08/01/2018, 7:23 AM Pager: [phone]  If 7PM-7AM, please contact night-coverage [email or web address] 08/01/2018, 7:23 AM

## 2018-08-01 NOTE — Progress Notes (Signed)
Tried to wean patient's oxygen to 1L from 2L but he complained of worsening shortness of breath. Increased oxygen nasal cannula back to 2L. Awaiting for parencentesis procedure. Will continue to monitor patient.

## 2018-08-01 NOTE — Progress Notes (Addendum)
Chart reviewed. Reintroduce diltiazem at 120 mg daily in hopes of decreasing IV diltiazem rate. Uptitrate PO metoprolol as tolerated.  Nigel Mormon, MD [facility] Pager: [phone] Office: [phone] If no answer Cell [phone]

## 2018-08-01 NOTE — Procedures (Signed)
PROCEDURE SUMMARY:  Successful image-guided paracentesis from the left lower abdomen.  Yielded 400 milliliters of clear amber fluid.  No immediate complications.  Patient tolerated well.   Specimen was sent for labs.  Estrella Alcaraz PA-C 08/01/2018 3:10 PM

## 2018-08-02 ENCOUNTER — Inpatient Hospital Stay (HOSPITAL_COMMUNITY): Payer: Medicare Other

## 2018-08-02 DIAGNOSIS — D696 Thrombocytopenia, unspecified: Secondary | ICD-10-CM

## 2018-08-02 DIAGNOSIS — M7989 Other specified soft tissue disorders: Secondary | ICD-10-CM

## 2018-08-02 LAB — CBC WITH DIFFERENTIAL/PLATELET
BASOS ABS: 0 10*3/uL (ref 0.0–0.1)
BASOS PCT: 0 %
EOS PCT: 0 %
Eosinophils Absolute: 0.1 10*3/uL (ref 0.0–0.7)
HCT: 37.5 % — ABNORMAL LOW (ref 39.0–52.0)
Hemoglobin: 13.2 g/dL (ref 13.0–17.0)
Lymphocytes Relative: 5 %
Lymphs Abs: 0.7 10*3/uL (ref 0.7–4.0)
MCH: 32 pg (ref 26.0–34.0)
MCHC: 35.2 g/dL (ref 30.0–36.0)
MCV: 90.8 fL (ref 78.0–100.0)
MONOS PCT: 12 %
Monocytes Absolute: 1.7 10*3/uL — ABNORMAL HIGH (ref 0.1–1.0)
Neutro Abs: 11.3 10*3/uL — ABNORMAL HIGH (ref 1.7–7.7)
Neutrophils Relative %: 83 %
PLATELETS: 105 10*3/uL — AB (ref 150–400)
RBC: 4.13 MIL/uL — ABNORMAL LOW (ref 4.22–5.81)
RDW: 14.6 % (ref 11.5–15.5)
WBC: 13.7 10*3/uL — ABNORMAL HIGH (ref 4.0–10.5)

## 2018-08-02 LAB — COMPREHENSIVE METABOLIC PANEL
ALK PHOS: 109 U/L (ref 38–126)
ALT: 20 U/L (ref 0–44)
AST: 20 U/L (ref 15–41)
Albumin: 2.3 g/dL — ABNORMAL LOW (ref 3.5–5.0)
Anion gap: 10 (ref 5–15)
BILIRUBIN TOTAL: 4.1 mg/dL — AB (ref 0.3–1.2)
BUN: 19 mg/dL (ref 8–23)
CALCIUM: 7.6 mg/dL — AB (ref 8.9–10.3)
CO2: 27 mmol/L (ref 22–32)
CREATININE: 0.86 mg/dL (ref 0.61–1.24)
Chloride: 96 mmol/L — ABNORMAL LOW (ref 98–111)
GFR calc Af Amer: >60 mL/min (ref >60)
GFR calc non Af Amer: >60 mL/min (ref >60)
GLUCOSE: 195 mg/dL — AB (ref 70–99)
Potassium: 3.5 mmol/L (ref 3.5–5.1)
Sodium: 133 mmol/L — ABNORMAL LOW (ref 135–145)
TOTAL PROTEIN: 5.3 g/dL — AB (ref 6.5–8.1)

## 2018-08-02 LAB — HEPATITIS PANEL, ACUTE
Hep A IgM: NEGATIVE
Hep B C IgM: NEGATIVE
Hepatitis B Surface Ag: NEGATIVE

## 2018-08-02 LAB — GLUCOSE, CAPILLARY: Glucose-Capillary: 111 mg/dL — ABNORMAL HIGH (ref 70–99)

## 2018-08-02 MED ORDER — POLYETHYLENE GLYCOL 3350 17 G PO PACK
17.0000 g | PACK | Freq: Every day | ORAL | Status: DC
  Administered 2018-08-02 – 2018-08-07 (×6): 17 g via ORAL
  Filled 2018-08-02 (×7): qty 1

## 2018-08-02 NOTE — Care Management Note (Signed)
Case Management Note  Patient Details  Name: Samuel Flores MRN: [id number] Date of Birth: 1941-08-22  Subjective/Objective:                  Hod-4, sepsis, a.fib/iv abx x2, wbc 13.7/Iv cardizem  Action/Plan: Following for progression of care and condition. Following for cm needs.  Expected Discharge Date:  (unknown)               Expected Discharge Plan:  Home/Self Care  In-House Referral:     Discharge planning Services  CM Consult  Post Acute Care Choice:    Choice offered to:     DME Arranged:    DME Agency:     HH Arranged:    HH Agency:     Status of Service:  In process, will continue to follow  If discussed at Long Length of Stay Meetings, dates discussed:    Additional Comments:  Leeroy Cha, RN 08/02/2018, 10:46 AM

## 2018-08-02 NOTE — Progress Notes (Signed)
Preliminary notes--Left upper extremity venous duplex exam completed. Negative for deep and superficial veins thrombosis.  Hongying [place] Fritze (RDMS RVT) 08/02/18 1:44 PM

## 2018-08-02 NOTE — Progress Notes (Signed)
PROGRESS NOTE    Samuel Flores  LPF:[id number] DOB: [birth year] DOA: 07/29/2018 PCP: Alycia Rossetti, MD   Brief Narrative: Samuel Flores is a 77 y.o. male with a history of atrial fibrillation, alcohol abuse. He presented secondary to feeling weak and found to have concern for sepsis secondary to e. Coli bacteremia, in addition to atrial fibrillation with RVR. He is being treated with IV antibiotics.   Assessment & Plan:   Active Problems:   Hypotension   Sepsis ([place])   Pneumonia due to infectious organism   Alcoholic cirrhosis of liver with ascites (HCC)   Atrial fibrillation with RVR (HCC)   Acute respiratory failure with hypoxia ([place])   Sepsis Secondary to E. Coli bacteremia. Still with tachycardia in setting of atrial fibrillation -Continue Unasyn  E. Coli bacteremia Unknown source. Possibly secondary to SBP. -Management above  Atrial fibrillation with RVR In setting of sepsis. Complicated by mild hypotension. Cardiology consulted. Still significantly tachycardic -Continue Cardizem drip -Increase to metoprolol 50 mg BID as his blood pressure is better tolerating medication -Cardiology recommendations -Eliquis  Possible RLL pneumonia -Continue Unasyn/azithromycin  Acute respiratory failure with hypoxia In setting of possible pneumonia. Mild pleural effusion. -Wean oxygen to room air  Acute kidney injury Baseline of 1. Up to 1.65 on admission. Resolved.  History of prostate cancer  Gout -Continue allopurinol  Ascites In setting of cirrhosis, likely alcoholic. S/p paracentesis on 9/19. Labs drawn. Abnormal. Question SBP as etiology for infection. Cytology and culture pending but patient has been on antibiotics.  Cirrhosis Likely alcoholic. Likely acute hepatitis on admission. Patient denies alcohol intake for the last 10 months so alcohol hepatitis unlikely. No alcohol level available from admission. AST/ALT improved. Bilirubin elevated but  downtrending. INR elevated. Ammonia normal. -Hepatitis panel  Elevated alkaline phosphatase Initial concern for possible cholecystitis. RUQ ultrasound without cholelithiasis, cholecystitis or ductal dilation. Now normal.  Thrombocytopenia Acute in setting of infection. Improving.  Constipation No evidence of ileus or SBO -Advance diet -Miralax  Left arm swelling Possibly secondary to low albumin/IV. No erythema or tenderness -Left arm ultrasound to rule out DVT   DVT prophylaxis: Eliquis Code Status:   Code Status: DNR Family Communication: None at bedside Disposition Plan: Discharge pending improvement of atrial fibrillation and transition to oral antibiotics for bacteremia   Consultants:   9/17: Cardiology, Dr. [name]  Procedures:   None  Antimicrobials:  Vancomycin  Zosyn  Ceftriaxone  Azithromycin   Unasyn   Subjective: Abdominal pain and shortness of breath improved. Some left arm swelling noticed this morning. Not painful.  Objective: Vitals:   08/02/18 0300 08/02/18 0400 08/02/18 0500 08/02/18 0600  BP: (!) 105/50 (!) 96/48 103/69 (!) 111/53  Pulse: 87 92 92 (!) 103  Resp: (!) 25 (!) 29 (!) 24 (!) 26  Temp:  98.7 F (37.1 C)    TempSrc:  Oral    SpO2: 95% 96% 93% 97%  Weight:      Height:        Intake/Output Summary (Last 24 hours) at 08/02/2018 0734 Last data filed at 08/02/2018 0600 Gross per 24 hour  Intake 857.46 ml  Output 2025 ml  Net -1167.54 ml   Filed Weights   07/29/18 1432  Weight: 82.9 kg    Examination:  General exam: Appears calm and comfortable Respiratory system: Clear to auscultation. Respiratory effort normal. Cardiovascular system: S1 & S2 heard, Irregular rhythm with normal rate. No murmurs, rubs, gallops or clicks. Gastrointestinal system: Abdomen is distended, soft  and nontender. Normal bowel sounds heard. Central nervous system: Alert and oriented. No focal neurological deficits. Extremities: Left arm  pitting edema up to elbow. No calf tenderness or LE edema Skin: No cyanosis. No rashes Psychiatry: Judgement and insight appear normal. Mood & affect appropriate.     Data Reviewed: I have personally reviewed following labs and imaging studies  CBC: Recent Labs  Lab 07/29/18 0904 07/30/18 0259 07/31/18 0304 08/01/18 0329 08/02/18 0343  WBC 28.8* 24.2* 13.4* 12.9* 13.7*  NEUTROABS 26.6* 22.6* 12.0* 11.0* 11.3*  HGB 15.9 14.3 14.5 13.9 13.2  HCT 44.6 41.8 42.5 39.9 37.5*  MCV 92.0 93.5 93.0 91.7 90.8  PLT 152 123* 84* 87* 409*   Basic Metabolic Panel: Recent Labs  Lab 07/29/18 0904 07/29/18 1455 07/30/18 0259 07/31/18 0304 08/01/18 0329 08/02/18 0343  NA 137  --  138 135 134* 133*  K 3.6  --  4.6 4.0 4.0 3.5  CL 100  --  102 104 101 96*  CO2 21*  --  24 22 24 27   GLUCOSE 144*  --  121* 126* 116* 195*  BUN 29*  --  22 15 16 19   CREATININE 1.65*  --  1.22 0.65 0.78 0.86  CALCIUM 9.0  --  8.4* 7.6* 7.9* 7.6*  MG  --  1.3* 2.0  --   --   --    GFR: Estimated Creatinine Clearance: 69.6 mL/min (by C-G formula based on SCr of 0.86 mg/dL). Liver Function Tests: Recent Labs  Lab 07/29/18 0904 07/30/18 0259 07/31/18 0304 08/01/18 0329 08/02/18 0343  AST 97* 80* 48* 25 20  ALT 54* 58* 37 28 20  ALKPHOS 172* 139* 111 103 109  BILITOT 5.3* 6.2* 6.3* 5.6* 4.1*  PROT 6.2* 6.0* 5.3* 5.2* 5.3*  ALBUMIN 3.3* 3.0* 2.5* 2.5* 2.3*   Recent Labs  Lab 07/29/18 0904  LIPASE 28   Recent Labs  Lab 08/01/18 0852  AMMONIA 23   Coagulation Profile: Recent Labs  Lab 07/29/18 0904 08/01/18 0852  INR 1.64 1.62   Cardiac Enzymes: Recent Labs  Lab 07/29/18 0904  TROPONINI 0.04*   BNP (last 3 results) No results for input(s): PROBNP in the last 8760 hours. HbA1C: No results for input(s): HGBA1C in the last 72 hours. CBG: No results for input(s): GLUCAP in the last 168 hours. Lipid Profile: No results for input(s): CHOL, HDL, LDLCALC, TRIG, CHOLHDL, LDLDIRECT in the  last 72 hours. Thyroid Function Tests: No results for input(s): TSH, T4TOTAL, FREET4, T3FREE, THYROIDAB in the last 72 hours. Anemia Panel: No results for input(s): VITAMINB12, FOLATE, FERRITIN, TIBC, IRON, RETICCTPCT in the last 72 hours. Sepsis Labs: Recent Labs  Lab 07/29/18 0928 07/29/18 1150 07/30/18 1659  PROCALCITON  --  59.06  --   LATICACIDVEN 4.5* 3.6* 2.3*    Recent Results (from the past 240 hour(s))  Culture, blood (routine x 2)     Status: Abnormal   Collection Time: 07/29/18  9:04 AM  Result Value Ref Range Status   Specimen Description   Final    BLOOD LEFT ANTECUBITAL Performed at [facility], [place] [address], [place], [place] [address]    Special Requests   Final    BOTTLES DRAWN AEROBIC AND ANAEROBIC Blood Culture adequate volume Performed at [place] [address] [place], [place] [address]    Culture  Setup Time   Final    GRAM NEGATIVE RODS IN BOTH AEROBIC AND ANAEROBIC BOTTLES CRITICAL VALUE NOTED.  VALUE IS CONSISTENT WITH PREVIOUSLY REPORTED AND CALLED VALUE.    Culture (A)  Final    ESCHERICHIA COLI SUSCEPTIBILITIES PERFORMED ON PREVIOUS CULTURE WITHIN THE LAST 5 DAYS. Performed at [facility], [place] [address] [place], [place] [address]    Report Status 08/01/2018 FINAL  Final  Culture, blood (routine x 2)     Status: Abnormal   Collection Time: 07/29/18  9:04 AM  Result Value Ref Range Status   Specimen Description   Final    BLOOD BLOOD RIGHT FOREARM Performed at [place] [address], [place], [place] [address]    Special Requests   Final    BOTTLES DRAWN AEROBIC AND ANAEROBIC Blood Culture results may not be optimal due to an excessive volume of blood received in culture bottles Performed at [place] [address], [place], [place] [address]    Culture  Setup Time   Final    IN BOTH AEROBIC AND ANAEROBIC BOTTLES GRAM NEGATIVE  RODS CRITICAL RESULT CALLED TO, READ BACK BY AND VERIFIED WITHLavell Luster PHARMD 0630 07/30/18 A BROWNING Performed at [facility], [place] [address] [place], [place] [address]    Culture ESCHERICHIA COLI (A)  Final   Report Status 07/31/2018 FINAL  Final   Organism ID, Bacteria ESCHERICHIA COLI  Final      Susceptibility   Escherichia coli - MIC*    AMPICILLIN <=2 SENSITIVE Sensitive     CEFAZOLIN <=4 SENSITIVE Sensitive     CEFEPIME <=1 SENSITIVE Sensitive     CEFTAZIDIME <=1 SENSITIVE Sensitive     CEFTRIAXONE <=1 SENSITIVE Sensitive     CIPROFLOXACIN <=0.25 SENSITIVE Sensitive     GENTAMICIN <=1 SENSITIVE Sensitive     IMIPENEM <=0.25 SENSITIVE Sensitive     TRIMETH/SULFA <=20 SENSITIVE Sensitive     AMPICILLIN/SULBACTAM <=2 SENSITIVE Sensitive     PIP/TAZO <=4 SENSITIVE Sensitive     Extended ESBL NEGATIVE Sensitive     * ESCHERICHIA COLI  Blood Culture ID Panel (Reflexed)     Status: Abnormal   Collection Time: 07/29/18  9:04 AM  Result Value Ref Range Status   Enterococcus species NOT DETECTED NOT DETECTED Final   Listeria monocytogenes NOT DETECTED NOT DETECTED Final   Staphylococcus species NOT DETECTED NOT DETECTED Final   Staphylococcus aureus NOT DETECTED NOT DETECTED Final   Streptococcus species NOT DETECTED NOT DETECTED Final   Streptococcus agalactiae NOT DETECTED NOT DETECTED Final   Streptococcus pneumoniae NOT DETECTED NOT DETECTED Final   Streptococcus pyogenes NOT DETECTED NOT DETECTED Final   Acinetobacter baumannii NOT DETECTED NOT DETECTED Final   Enterobacteriaceae species DETECTED (A) NOT DETECTED Final    Comment: Enterobacteriaceae represent a large family of gram-negative bacteria, not a single organism. CRITICAL RESULT CALLED TO, READ BACK BY AND VERIFIED WITH: Lavell Luster PHARMD [id number] 07/30/18 A BROWNING    Enterobacter cloacae complex NOT DETECTED NOT DETECTED Final   Escherichia coli DETECTED (A) NOT DETECTED Final    Comment: CRITICAL  RESULT CALLED TO, READ BACK BY AND VERIFIED WITH: Lavell Luster PHARMD 0031 07/30/18 A BROWNING    Klebsiella oxytoca NOT DETECTED NOT DETECTED Final   Klebsiella pneumoniae NOT DETECTED NOT DETECTED Final   Proteus species NOT DETECTED NOT DETECTED Final   Serratia marcescens NOT DETECTED NOT DETECTED Final   Carbapenem resistance NOT DETECTED NOT DETECTED Final   Haemophilus influenzae NOT DETECTED NOT DETECTED Final   Neisseria meningitidis NOT DETECTED NOT DETECTED  Final   Pseudomonas aeruginosa NOT DETECTED NOT DETECTED Final   Candida albicans NOT DETECTED NOT DETECTED Final   Candida glabrata NOT DETECTED NOT DETECTED Final   Candida krusei NOT DETECTED NOT DETECTED Final   Candida parapsilosis NOT DETECTED NOT DETECTED Final   Candida tropicalis NOT DETECTED NOT DETECTED Final    Comment: Performed at [facility], [place] [address], [place], [place] [address]  Culture, Urine     Status: None   Collection Time: 07/29/18  1:28 PM  Result Value Ref Range Status   Specimen Description   Final    URINE, RANDOM Performed at [place] [address], [place], [place] [address]    Special Requests   Final    NONE Performed at [facility], [place] [address], [place], [place] [address]    Culture   Final    NO GROWTH Performed at [facility], [place] [address] [place], [place] [address]    Report Status 08/01/2018 FINAL  Final  MRSA PCR Screening     Status: None   Collection Time: 07/29/18  2:54 PM  Result Value Ref Range Status   MRSA by PCR NEGATIVE NEGATIVE Final    Comment:        The GeneXpert MRSA Assay (FDA approved for NASAL specimens only), is one component of a comprehensive MRSA colonization surveillance program. It is not intended to diagnose MRSA infection nor to guide or monitor treatment for MRSA infections. Performed at [facility], [place] [address], [place], [place] [address]   Culture,  body fluid-bottle     Status: None (Preliminary result)   Collection Time: 08/01/18  3:38 PM  Result Value Ref Range Status   Specimen Description FLUID PERITONEAL  Final   Special Requests BOTTLES DRAWN AEROBIC AND ANAEROBIC  Final   Culture   Final    NO GROWTH < 12 HOURS Performed at [facility], [place] [address], [place], [place] [address]    Report Status PENDING  Incomplete  Gram stain     Status: None   Collection Time: 08/01/18  3:38 PM  Result Value Ref Range Status   Specimen Description FLUID PERITONEAL  Final   Special Requests NONE  Final   Gram Stain   Final    ABUNDANT WBC PRESENT, PREDOMINANTLY PMN NO ORGANISMS SEEN Performed at [facility], [place] [address] [place],  [address]    Report Status 08/01/2018 FINAL  Final         Radiology Studies: Ct Abdomen Pelvis Wo Contrast  Result Date: 07/31/2018 CLINICAL DATA:  Diffuse abdominal pain and distension today. EXAM: CT ABDOMEN AND PELVIS WITHOUT CONTRAST TECHNIQUE: Multidetector CT imaging of the abdomen and pelvis was performed following the standard protocol without IV contrast. COMPARISON:  Abdominal radiographs July 31, 2018 and CT chest July 30, 2018. FINDINGS: LOWER CHEST: Small bilateral pleural effusions with worsening underlying consolidation and punctate calcifications. Lingular atelectasis. Mild bibasilar bronchial wall thickening. Heart size is mildly enlarged. Severe coronary artery calcification. Small pericardial effusion. HEPATOBILIARY: Nodular hepatic contour, mildly enlarged caudate lobe and LEFT lobe of the liver. Distended gallbladder with mild gallbladder wall thickening. PANCREAS: Normal. SPLEEN: Normal. ADRENALS/URINARY TRACT: Kidneys are orthotopic, demonstrating normal size and morphology. No nephrolithiasis, hydronephrosis; limited assessment for renal masses by nonenhanced CT. The unopacified ureters are normal in course and caliber. Urinary bladder decompressed by  Foley catheter. Normal adrenal glands. STOMACH/BOWEL: The stomach, small  and large bowel are normal in course and caliber without inflammatory changes, sensitivity decreased by lack of enteric contrast. Mild colonic diverticulosis. Normal appendix. VASCULAR/LYMPHATIC: Aortoiliac vessels are normal in course and caliber. Moderate calcific atherosclerosis. No lymphadenopathy by CT size criteria. Greater than expected number small lymph nodes in upper abdomen. REPRODUCTIVE: Status post prostatectomy. OTHER: Small to moderate volume ascites. Somewhat matted appearance of the omentum LEFT abdomen (for example, series 2, image 54). Single subcentimeter bubble of gas adjacent to the duodenum (series 2, image 47), posterior to the common bile duct. Small bilateral fat containing inguinal hernias. MUSCULOSKELETAL: Non-acute. Severe L5-S1 degenerative disc. Grade 1 L4-5 anterolisthesis without spondylolysis. Osteopenia and Schmorl's nodes. IMPRESSION: 1. Possible acute cholecystitis though, given cirrhosis and small to moderate volume ascites this may be reactive. Ultrasound may be of added value. 2. Matted appearance to the omentum LEFT abdomen, carcinomatosis not excluded. 3. Single bubble of gas adjacent to the duodenum, possible diverticulum, less likely extraluminal. 4. Small pleural effusions with worsening bilateral lower lobe atelectasis versus pneumonia. Aortic Atherosclerosis (ICD10-I70.0). Electronically Signed   By: Elon Alas M.D.   On: 07/31/2018 17:04   US Paracentesis  Result Date: 08/01/2018 INDICATION: Patient with history of alcoholic cirrhosis with ascites. Request is made for diagnostic and therapeutic paracentesis. EXAM: ULTRASOUND GUIDED DIAGNOSTIC AND THERAPEUTIC PARACENTESIS MEDICATIONS: 10 mL of 1% lidocaine COMPLICATIONS: None immediate. PROCEDURE: Informed written consent was obtained from the patient after a discussion of the risks, benefits and alternatives to treatment. A timeout was  performed prior to the initiation of the procedure. Initial ultrasound scanning demonstrates a small amount of ascites within the left lower abdominal quadrant. The left lower abdomen was prepped and draped in the usual sterile fashion. 1% lidocaine was used for local anesthesia. Following this, a 6 Fr Safe-T-Centesis catheter was introduced. An ultrasound image was saved for documentation purposes. The paracentesis was performed. The catheter was removed and a dressing was applied. The patient tolerated the procedure well without immediate post procedural complication. FINDINGS: A total of approximately 400 mL of clear amber fluid was removed. Samples were sent to the laboratory as requested by the clinical team. IMPRESSION: Successful ultrasound-guided paracentesis yielding 400 mL of peritoneal fluid. Read by: Earley Abide, PA-C Electronically Signed   By: Corrie Mckusick D.O.   On: 08/01/2018 15:11   Dg Chest Port 1 View  Result Date: 08/01/2018 CLINICAL DATA:  Shortness of breath.  Abdominal pain. EXAM: PORTABLE CHEST 1 VIEW COMPARISON:  CT 07/30/2018.  Chest x-ray 07/30/2018. FINDINGS: Cardiomegaly. No pulmonary venous congestion. Low lung volumes with bibasilar atelectasis/infiltrates. No interim improvement from prior exam. No pneumothorax. Prior cervicothoracic fusion. IMPRESSION: Number low lung volumes with bibasilar atelectasis/infiltrates. No interim improvement from prior exam. 2.  Stable cardiomegaly. Electronically Signed   By: Marcello Moores  Register   On: 08/01/2018 12:06   Dg Abd Portable 1v  Result Date: 07/31/2018 CLINICAL DATA:  Abdominal pain EXAM: PORTABLE ABDOMEN - 1 VIEW COMPARISON:  07/30/2018 FINDINGS: Scattered large and small bowel gas is noted. The small bowel loops in the mid abdomen are minimally prominent. This may be related to an underlying ileus. No free air is noted. No mass lesion is seen. Degenerative changes of lumbar spine are noted. IMPRESSION: Mild prominence of small  bowel gas pattern. Correlation with the physical exam is recommended. CT may be helpful for further evaluation. Electronically Signed   By: Inez Catalina M.D.   On: 07/31/2018 11:56        Scheduled Meds: .  allopurinol  200 mg Oral Daily  . apixaban  5 mg Oral BID  . diltiazem  120 mg Oral Daily  . fluticasone  2 spray Each Nare Daily  . mouth rinse  15 mL Mouth Rinse BID  . metoprolol tartrate  50 mg Oral BID  . sodium chloride flush  3 mL Intravenous Q12H   Continuous Infusions: . sodium chloride 250 mL (08/01/18 1133)  . ampicillin-sulbactam (UNASYN) IV Stopped (08/02/18 3762)  . azithromycin Stopped (08/01/18 1450)  . diltiazem (CARDIZEM) infusion 5 mg/hr (08/02/18 0556)     LOS: 4 days     Cordelia Poche, MD [facility] 08/02/2018, 7:34 AM Pager: [phone]  If 7PM-7AM, please contact night-coverage [email or web address] 08/02/2018, 7:34 AM

## 2018-08-03 DIAGNOSIS — J9601 Acute respiratory failure with hypoxia: Secondary | ICD-10-CM

## 2018-08-03 DIAGNOSIS — K7031 Alcoholic cirrhosis of liver with ascites: Secondary | ICD-10-CM

## 2018-08-03 LAB — CBC WITH DIFFERENTIAL/PLATELET
BASOS ABS: 0 10*3/uL (ref 0.0–0.1)
BASOS PCT: 0 %
EOS ABS: 0.1 10*3/uL (ref 0.0–0.7)
EOS PCT: 1 %
HCT: 40.7 % (ref 39.0–52.0)
Hemoglobin: 14 g/dL (ref 13.0–17.0)
Lymphocytes Relative: 6 %
Lymphs Abs: 1 10*3/uL (ref 0.7–4.0)
MCH: 31.5 pg (ref 26.0–34.0)
MCHC: 34.4 g/dL (ref 30.0–36.0)
MCV: 91.5 fL (ref 78.0–100.0)
MONO ABS: 1.8 10*3/uL — AB (ref 0.1–1.0)
MONOS PCT: 11 %
NEUTROS ABS: 13.4 10*3/uL — AB (ref 1.7–7.7)
Neutrophils Relative %: 82 %
PLATELETS: 162 10*3/uL (ref 150–400)
RBC: 4.45 MIL/uL (ref 4.22–5.81)
RDW: 14.7 % (ref 11.5–15.5)
WBC: 16.4 10*3/uL — ABNORMAL HIGH (ref 4.0–10.5)

## 2018-08-03 LAB — COMPREHENSIVE METABOLIC PANEL
ALBUMIN: 2.2 g/dL — AB (ref 3.5–5.0)
ALK PHOS: 130 U/L — AB (ref 38–126)
ALT: 18 U/L (ref 0–44)
ANION GAP: 12 (ref 5–15)
AST: 22 U/L (ref 15–41)
BILIRUBIN TOTAL: 3 mg/dL — AB (ref 0.3–1.2)
BUN: 21 mg/dL (ref 8–23)
CO2: 26 mmol/L (ref 22–32)
CREATININE: 0.83 mg/dL (ref 0.61–1.24)
Calcium: 7.4 mg/dL — ABNORMAL LOW (ref 8.9–10.3)
Chloride: 98 mmol/L (ref 98–111)
GFR calc non Af Amer: >60 mL/min (ref >60)
GLUCOSE: 110 mg/dL — AB (ref 70–99)
Potassium: 3.6 mmol/L (ref 3.5–5.1)
SODIUM: 136 mmol/L (ref 135–145)
TOTAL PROTEIN: 5.3 g/dL — AB (ref 6.5–8.1)

## 2018-08-03 MED ORDER — FUROSEMIDE 20 MG PO TABS
20.0000 mg | ORAL_TABLET | Freq: Every day | ORAL | Status: DC
  Administered 2018-08-03 – 2018-08-07 (×5): 20 mg via ORAL
  Filled 2018-08-03 (×5): qty 1

## 2018-08-03 NOTE — Evaluation (Signed)
Physical Therapy Evaluation Patient Details Name: Samuel Flores MRN: [id number] DOB: 12-25-1940 Today's Date: 08/03/2018   History of Present Illness  Pt admitted from home and dx with sepsis, hypotension, PNA, acute resp failure, AKI, A-fib with RVR and Ascites.  Pt with hx of TIA, CAD, subdural hematoma, back surgery and ETOH abuse  Clinical Impression  Pt admitted as above and presenting with functional mobility limitations 2* generalized weakness and balance deficits.  Pt should progress to dc home with assist of family.    Follow Up Recommendations No PT follow up(Pt resistant to PT follow up)    Equipment Recommendations  None recommended by PT    Recommendations for Other Services       Precautions / Restrictions Precautions Precautions: Fall Restrictions Weight Bearing Restrictions: No      Mobility  Bed Mobility Overal bed mobility: Modified Independent             General bed mobility comments: Increased time but pt requires no assistance supine<>sit  Transfers Overall transfer level: Needs assistance Equipment used: None Transfers: Sit to/from Stand Sit to Stand: Min assist;Min guard         General transfer comment: steady assist to bring wt up and fwd and to balance in initial standing  Ambulation/Gait Ambulation/Gait assistance: Min assist Gait Distance (Feet): 80 Feet Assistive device: Rolling walker (2 wheeled);None Gait Pattern/deviations: Step-through pattern;Decreased step length - right;Decreased step length - left;Shuffle;Trunk flexed;Wide base of support Gait velocity: decr   General Gait Details: Pt very unsteady with attempt to ambulate sans AD - marked improvement with use of RW.  Cues for posture, pace and position from [employer]            Wheelchair Mobility    Modified Rankin (Stroke Patients Only)       Balance Overall balance assessment: Needs assistance Sitting-balance support: No upper extremity  supported;Feet supported Sitting balance-Leahy Scale: Good     Standing balance support: Bilateral upper extremity supported Standing balance-Leahy Scale: Poor                               Pertinent Vitals/Pain Pain Assessment: No/denies pain    Home Living Family/patient expects to be discharged to:: Private residence Living Arrangements: Spouse/significant other Available Help at Discharge: Family;Available 24 hours/day Type of Home: House Home Access: Level entry     Home Layout: Two level;Bed/bath upstairs Home Equipment: Walker - 2 wheels      Prior Function Level of Independence: Independent         Comments: Reports that he limits his activity due to weakness. Pt drives and reports intermittent use of cane.      Hand Dominance        Extremity/Trunk Assessment   Upper Extremity Assessment Upper Extremity Assessment: Generalized weakness    Lower Extremity Assessment Lower Extremity Assessment: Generalized weakness       Communication   Communication: No difficulties  Cognition Arousal/Alertness: Awake/alert Behavior During Therapy: Impulsive Overall Cognitive Status: Within Functional Limits for tasks assessed                                        General Comments      Exercises     Assessment/Plan    PT Assessment Patient needs continued PT services  PT Problem List Decreased  strength;Decreased activity tolerance;Decreased balance;Decreased mobility;Decreased knowledge of use of DME       PT Treatment Interventions DME instruction;Gait training;Stair training;Functional mobility training;Therapeutic activities;Therapeutic exercise;Balance training;Patient/family education    PT Goals (Current goals can be found in the Care Plan section)  Acute Rehab PT Goals Patient Stated Goal: Regain IND PT Goal Formulation: With patient Time For Goal Achievement: 08/17/18 Potential to Achieve Goals: Fair     Frequency Min 3X/week   Barriers to discharge        Co-evaluation               AM-PAC PT "6 Clicks" Daily Activity  Outcome Measure Difficulty turning over in bed (including adjusting bedclothes, sheets and blankets)?: A Little Difficulty moving from lying on back to sitting on the side of the bed? : A Lot Difficulty sitting down on and standing up from a chair with arms (e.g., wheelchair, bedside commode, etc,.)?: A Lot Help needed moving to and from a bed to chair (including a wheelchair)?: A Little Help needed walking in hospital room?: A Little Help needed climbing 3-5 steps with a railing? : A Little 6 Click Score: 16    End of Session Equipment Utilized During Treatment: Gait belt Activity Tolerance: Patient limited by fatigue Patient left: in bed;with call bell/phone within reach Nurse Communication: Mobility status PT Visit Diagnosis: History of falling (Z91.81);Difficulty in walking, not elsewhere classified (R26.2);Unsteadiness on feet (R26.81);Muscle weakness (generalized) (M62.81)    Time: 4193-7902 PT Time Calculation (min) (ACUTE ONLY): 20 min   Charges:   PT Evaluation $PT Eval Low Complexity: 1 Low          [place] Pager [phone] Office [phone]   Landrey Mahurin 08/03/2018, 5:08 PM

## 2018-08-03 NOTE — Progress Notes (Signed)
PROGRESS NOTE    Samuel Flores  DXI:[id number] DOB: 02-28-1941 DOA: 07/29/2018 PCP: Alycia Rossetti, MD   Brief Narrative: Samuel Flores is a 77 y.o. male with a history of atrial fibrillation, alcohol abuse. He presented secondary to feeling weak and found to have concern for sepsis secondary to e. Coli bacteremia, in addition to atrial fibrillation with RVR. He is being treated with IV antibiotics.   Assessment & Plan:   Active Problems:   Hypotension   Sepsis ([place])   Pneumonia due to infectious organism   Alcoholic cirrhosis of liver with ascites (HCC)   Atrial fibrillation with RVR (HCC)   Acute respiratory failure with hypoxia ([place])   Sepsis Secondary to E. Coli bacteremia. Still with tachycardia in setting of atrial fibrillation -Continue Unasyn  E. Coli bacteremia Unknown source. Possibly secondary to SBP. -Management above  Atrial fibrillation with RVR In setting of sepsis. Complicated by mild hypotension. Cardiology consulted. Still significantly tachycardic -Cardiology recommendations: Cardizem PO and metoprolol -Eliquis -Discontinue Cardizem drip -PT eval  Possible RLL pneumonia -Continue Unasyn/azithromycin  Acute respiratory failure with hypoxia In setting of possible pneumonia. Mild pleural effusion. Weaned to room air.  Acute kidney injury Baseline of 1. Up to 1.65 on admission. Resolved.  History of prostate cancer  Gout -Continue allopurinol  Ascites In setting of cirrhosis, likely alcoholic. S/p paracentesis on 9/19. Labs drawn. Abnormal. Question SBP as etiology for infection. Cytology and culture pending but patient has been on antibiotics. -Start Lasix 20 mg PO  Cirrhosis Likely alcoholic. Likely acute hepatitis on admission. Patient denies alcohol intake for the last 10 months so alcohol hepatitis unlikely. No alcohol level available from admission. AST/ALT improved. Bilirubin elevated but downtrending. INR elevated. Ammonia  normal. Hepatitis panel negative.  Elevated alkaline phosphatase Initial concern for possible cholecystitis. RUQ ultrasound without cholelithiasis, cholecystitis or ductal dilation. Trended down, slightly up now. -Repeat CMP in AM  Thrombocytopenia Acute in setting of infection. Resolved.  Constipation No evidence of ileus or SBO -Advance diet -Miralax  Left arm swelling Possibly secondary to low albumin/IV. No erythema or tenderness. Duplex negative for DVT   DVT prophylaxis: Eliquis Code Status:   Code Status: DNR Family Communication: None at bedside Disposition Plan: Transfer to telemetry   Consultants:   9/17: Cardiology, Dr. [name]  Procedures:   None  Antimicrobials:  Vancomycin  Zosyn  Ceftriaxone  Azithromycin   Unasyn   Subjective: No chest pain. Some left shoulder pain that is intermittent. No dyspnea. No bowel movement.  Objective: Vitals:   08/03/18 0000 08/03/18 0100 08/03/18 0200 08/03/18 0400  BP: (!) 105/56 (!) 109/49 115/66 (!) 118/52  Pulse: 91 (!) 130 89 (!) 103  Resp: (!) 26 (!) 24 (!) 34 (!) 27  Temp:    97.9 F (36.6 C)  TempSrc:    Oral  SpO2: 92% 93% 92% 92%  Weight:      Height:        Intake/Output Summary (Last 24 hours) at 08/03/2018 0729 Last data filed at 08/03/2018 0400 Gross per 24 hour  Intake 1182.47 ml  Output 900 ml  Net 282.47 ml   Filed Weights   07/29/18 1432  Weight: 82.9 kg    Examination:  General exam: Appears calm and comfortable Respiratory system: Clear to auscultation. Respiratory effort normal. Cardiovascular system: S1 & S2 heard, Normal rate with irregular rhythm. No murmurs, rubs, gallops or clicks. Gastrointestinal system: Abdomen is nondistended, soft and nontender. No organomegaly or masses felt. Normal bowel  sounds heard. Central nervous system: Alert and oriented. No focal neurological deficits. Extremities: No LE edema. No calf tenderness Skin: No cyanosis. No rashes Psychiatry:  Judgement and insight appear normal. Mood & affect appropriate.     Data Reviewed: I have personally reviewed following labs and imaging studies  CBC: Recent Labs  Lab 07/30/18 0259 07/31/18 0304 08/01/18 0329 08/02/18 0343 08/03/18 0324  WBC 24.2* 13.4* 12.9* 13.7* 16.4*  NEUTROABS 22.6* 12.0* 11.0* 11.3* 13.4*  HGB 14.3 14.5 13.9 13.2 14.0  HCT 41.8 42.5 39.9 37.5* 40.7  MCV 93.5 93.0 91.7 90.8 91.5  PLT 123* 84* 87* 105* 664   Basic Metabolic Panel: Recent Labs  Lab 07/29/18 1455 07/30/18 0259 07/31/18 0304 08/01/18 0329 08/02/18 0343 08/03/18 0324  NA  --  138 135 134* 133* 136  K  --  4.6 4.0 4.0 3.5 3.6  CL  --  102 104 101 96* 98  CO2  --  24 22 24 27 26   GLUCOSE  --  121* 126* 116* 195* 110*  BUN  --  22 15 16 19 21   CREATININE  --  1.22 0.65 0.78 0.86 0.83  CALCIUM  --  8.4* 7.6* 7.9* 7.6* 7.4*  MG 1.3* 2.0  --   --   --   --    GFR: Estimated Creatinine Clearance: 72.2 mL/min (by C-G formula based on SCr of 0.83 mg/dL). Liver Function Tests: Recent Labs  Lab 07/30/18 0259 07/31/18 0304 08/01/18 0329 08/02/18 0343 08/03/18 0324  AST 80* 48* 25 20 22   ALT 58* 37 28 20 18   ALKPHOS 139* 111 103 109 130*  BILITOT 6.2* 6.3* 5.6* 4.1* 3.0*  PROT 6.0* 5.3* 5.2* 5.3* 5.3*  ALBUMIN 3.0* 2.5* 2.5* 2.3* 2.2*   Recent Labs  Lab 07/29/18 0904  LIPASE 28   Recent Labs  Lab 08/01/18 0852  AMMONIA 23   Coagulation Profile: Recent Labs  Lab 07/29/18 0904 08/01/18 0852  INR 1.64 1.62   Cardiac Enzymes: Recent Labs  Lab 07/29/18 0904  TROPONINI 0.04*   BNP (last 3 results) No results for input(s): PROBNP in the last 8760 hours. HbA1C: No results for input(s): HGBA1C in the last 72 hours. CBG: Recent Labs  Lab 08/02/18 1611  GLUCAP 111*   Lipid Profile: No results for input(s): CHOL, HDL, LDLCALC, TRIG, CHOLHDL, LDLDIRECT in the last 72 hours. Thyroid Function Tests: No results for input(s): TSH, T4TOTAL, FREET4, T3FREE, THYROIDAB in  the last 72 hours. Anemia Panel: No results for input(s): VITAMINB12, FOLATE, FERRITIN, TIBC, IRON, RETICCTPCT in the last 72 hours. Sepsis Labs: Recent Labs  Lab 07/29/18 0928 07/29/18 1150 07/30/18 1659  PROCALCITON  --  59.06  --   LATICACIDVEN 4.5* 3.6* 2.3*    Recent Results (from the past 240 hour(s))  Culture, blood (routine x 2)     Status: Abnormal   Collection Time: 07/29/18  9:04 AM  Result Value Ref Range Status   Specimen Description   Final    BLOOD LEFT ANTECUBITAL Performed at [facility], [place] [address], [place], [place] [address]    Special Requests   Final    BOTTLES DRAWN AEROBIC AND ANAEROBIC Blood Culture adequate volume Performed at [facility] [address], [place], [place] [address]    Culture  Setup Time   Final    GRAM NEGATIVE RODS IN BOTH AEROBIC AND ANAEROBIC BOTTLES CRITICAL VALUE NOTED.  VALUE IS CONSISTENT WITH PREVIOUSLY REPORTED AND CALLED VALUE.  Culture (A)  Final    ESCHERICHIA COLI SUSCEPTIBILITIES PERFORMED ON PREVIOUS CULTURE WITHIN THE LAST 5 DAYS. Performed at [facility], [place] [address] [place], [place] [address]    Report Status 08/01/2018 FINAL  Final  Culture, blood (routine x 2)     Status: Abnormal   Collection Time: 07/29/18  9:04 AM  Result Value Ref Range Status   Specimen Description   Final    BLOOD BLOOD RIGHT FOREARM Performed at [facility] [address] [place], [place] [address]    Special Requests   Final    BOTTLES DRAWN AEROBIC AND ANAEROBIC Blood Culture results may not be optimal due to an excessive volume of blood received in culture bottles Performed at [facility] [address], [place], [place] [address]    Culture  Setup Time   Final    IN BOTH AEROBIC AND ANAEROBIC BOTTLES GRAM NEGATIVE RODS CRITICAL RESULT CALLED TO, READ BACK BY AND VERIFIED WITHLavell Luster PHARMD [id number] 07/30/18 A BROWNING Performed  at [facility], [place] [address], [place], [place] [address]    Culture ESCHERICHIA COLI (A)  Final   Report Status 07/31/2018 FINAL  Final   Organism ID, Bacteria ESCHERICHIA COLI  Final      Susceptibility   Escherichia coli - MIC*    AMPICILLIN <=2 SENSITIVE Sensitive     CEFAZOLIN <=4 SENSITIVE Sensitive     CEFEPIME <=1 SENSITIVE Sensitive     CEFTAZIDIME <=1 SENSITIVE Sensitive     CEFTRIAXONE <=1 SENSITIVE Sensitive     CIPROFLOXACIN <=0.25 SENSITIVE Sensitive     GENTAMICIN <=1 SENSITIVE Sensitive     IMIPENEM <=0.25 SENSITIVE Sensitive     TRIMETH/SULFA <=20 SENSITIVE Sensitive     AMPICILLIN/SULBACTAM <=2 SENSITIVE Sensitive     PIP/TAZO <=4 SENSITIVE Sensitive     Extended ESBL NEGATIVE Sensitive     * ESCHERICHIA COLI  Blood Culture ID Panel (Reflexed)     Status: Abnormal   Collection Time: 07/29/18  9:04 AM  Result Value Ref Range Status   Enterococcus species NOT DETECTED NOT DETECTED Final   Listeria monocytogenes NOT DETECTED NOT DETECTED Final   Staphylococcus species NOT DETECTED NOT DETECTED Final   Staphylococcus aureus NOT DETECTED NOT DETECTED Final   Streptococcus species NOT DETECTED NOT DETECTED Final   Streptococcus agalactiae NOT DETECTED NOT DETECTED Final   Streptococcus pneumoniae NOT DETECTED NOT DETECTED Final   Streptococcus pyogenes NOT DETECTED NOT DETECTED Final   Acinetobacter baumannii NOT DETECTED NOT DETECTED Final   Enterobacteriaceae species DETECTED (A) NOT DETECTED Final    Comment: Enterobacteriaceae represent a large family of gram-negative bacteria, not a single organism. CRITICAL RESULT CALLED TO, READ BACK BY AND VERIFIED WITH: Lavell Luster PHARMD [id number] 07/30/18 A BROWNING    Enterobacter cloacae complex NOT DETECTED NOT DETECTED Final   Escherichia coli DETECTED (A) NOT DETECTED Final    Comment: CRITICAL RESULT CALLED TO, READ BACK BY AND VERIFIED WITH: Lavell Luster PHARMD [id number] 07/30/18 A BROWNING    Klebsiella oxytoca NOT  DETECTED NOT DETECTED Final   Klebsiella pneumoniae NOT DETECTED NOT DETECTED Final   Proteus species NOT DETECTED NOT DETECTED Final   Serratia marcescens NOT DETECTED NOT DETECTED Final   Carbapenem resistance NOT DETECTED NOT DETECTED Final   Haemophilus influenzae NOT DETECTED NOT DETECTED Final   Neisseria meningitidis NOT DETECTED NOT DETECTED Final   Pseudomonas aeruginosa NOT DETECTED NOT DETECTED Final  Candida albicans NOT DETECTED NOT DETECTED Final   Candida glabrata NOT DETECTED NOT DETECTED Final   Candida krusei NOT DETECTED NOT DETECTED Final   Candida parapsilosis NOT DETECTED NOT DETECTED Final   Candida tropicalis NOT DETECTED NOT DETECTED Final    Comment: Performed at [facility], [place] [address], [place], [place] [address]  Culture, Urine     Status: None   Collection Time: 07/29/18  1:28 PM  Result Value Ref Range Status   Specimen Description   Final    URINE, RANDOM Performed at [facility] [address], [place], [place] [address]    Special Requests   Final    NONE Performed at [facility], [place] [address] [place], [place] [address]    Culture   Final    NO GROWTH Performed at [facility], [place] [address] [place], [place] [address]    Report Status 08/01/2018 FINAL  Final  MRSA PCR Screening     Status: None   Collection Time: 07/29/18  2:54 PM  Result Value Ref Range Status   MRSA by PCR NEGATIVE NEGATIVE Final    Comment:        The GeneXpert MRSA Assay (FDA approved for NASAL specimens only), is one component of a comprehensive MRSA colonization surveillance program. It is not intended to diagnose MRSA infection nor to guide or monitor treatment for MRSA infections. Performed at [facility], [place] [address], [place], [place] [address]   Culture, body fluid-bottle     Status: None (Preliminary result)   Collection Time: 08/01/18  3:38 PM  Result Value Ref Range  Status   Specimen Description FLUID PERITONEAL  Final   Special Requests BOTTLES DRAWN AEROBIC AND ANAEROBIC  Final   Culture   Final    NO GROWTH < 24 HOURS Performed at [facility], [place] [address], [place], [place] [address]    Report Status PENDING  Incomplete  Gram stain     Status: None   Collection Time: 08/01/18  3:38 PM  Result Value Ref Range Status   Specimen Description FLUID PERITONEAL  Final   Special Requests NONE  Final   Gram Stain   Final    ABUNDANT WBC PRESENT, PREDOMINANTLY PMN NO ORGANISMS SEEN Performed at [facility], [place] [address] [place],  [address]    Report Status 08/01/2018 FINAL  Final         Radiology Studies: US Paracentesis  Result Date: 08/01/2018 INDICATION: Patient with history of alcoholic cirrhosis with ascites. Request is made for diagnostic and therapeutic paracentesis. EXAM: ULTRASOUND GUIDED DIAGNOSTIC AND THERAPEUTIC PARACENTESIS MEDICATIONS: 10 mL of 1% lidocaine COMPLICATIONS: None immediate. PROCEDURE: Informed written consent was obtained from the patient after a discussion of the risks, benefits and alternatives to treatment. A timeout was performed prior to the initiation of the procedure. Initial ultrasound scanning demonstrates a small amount of ascites within the left lower abdominal quadrant. The left lower abdomen was prepped and draped in the usual sterile fashion. 1% lidocaine was used for local anesthesia. Following this, a 6 Fr Safe-T-Centesis catheter was introduced. An ultrasound image was saved for documentation purposes. The paracentesis was performed. The catheter was removed and a dressing was applied. The patient tolerated the procedure well without immediate post procedural complication. FINDINGS: A total of approximately 400 mL of clear amber fluid was removed. Samples were sent to the laboratory as requested by the clinical  team. IMPRESSION: Successful ultrasound-guided paracentesis yielding 400 mL  of peritoneal fluid. Read by: Earley Abide, PA-C Electronically Signed   By: Corrie Mckusick D.O.   On: 08/01/2018 15:11   Dg Chest Port 1 View  Result Date: 08/01/2018 CLINICAL DATA:  Shortness of breath.  Abdominal pain. EXAM: PORTABLE CHEST 1 VIEW COMPARISON:  CT 07/30/2018.  Chest x-ray 07/30/2018. FINDINGS: Cardiomegaly. No pulmonary venous congestion. Low lung volumes with bibasilar atelectasis/infiltrates. No interim improvement from prior exam. No pneumothorax. Prior cervicothoracic fusion. IMPRESSION: Number low lung volumes with bibasilar atelectasis/infiltrates. No interim improvement from prior exam. 2.  Stable cardiomegaly. Electronically Signed   By: Marcello Moores  Register   On: 08/01/2018 12:06        Scheduled Meds: . allopurinol  200 mg Oral Daily  . apixaban  5 mg Oral BID  . diltiazem  120 mg Oral Daily  . fluticasone  2 spray Each Nare Daily  . mouth rinse  15 mL Mouth Rinse BID  . metoprolol tartrate  50 mg Oral BID  . polyethylene glycol  17 g Oral Daily  . sodium chloride flush  3 mL Intravenous Q12H   Continuous Infusions: . sodium chloride Stopped (08/02/18 1847)  . ampicillin-sulbactam (UNASYN) IV Stopped (08/03/18 0532)  . azithromycin Stopped (08/02/18 1524)  . diltiazem (CARDIZEM) infusion Stopped (08/02/18 1411)     LOS: 5 days     Cordelia Poche, MD [facility] 08/03/2018, 7:29 AM Pager: [phone]  If 7PM-7AM, please contact night-coverage [email or web address] 08/03/2018, 7:29 AM

## 2018-08-04 ENCOUNTER — Inpatient Hospital Stay (HOSPITAL_COMMUNITY): Payer: Medicare Other

## 2018-08-04 LAB — COMPREHENSIVE METABOLIC PANEL
ALT: 16 U/L (ref 0–44)
ANION GAP: 11 (ref 5–15)
AST: 27 U/L (ref 15–41)
Albumin: 2.3 g/dL — ABNORMAL LOW (ref 3.5–5.0)
Alkaline Phosphatase: 164 U/L — ABNORMAL HIGH (ref 38–126)
BILIRUBIN TOTAL: 2.9 mg/dL — AB (ref 0.3–1.2)
BUN: 18 mg/dL (ref 8–23)
CHLORIDE: 100 mmol/L (ref 98–111)
CO2: 28 mmol/L (ref 22–32)
Calcium: 7.6 mg/dL — ABNORMAL LOW (ref 8.9–10.3)
Creatinine, Ser: 0.82 mg/dL (ref 0.61–1.24)
GFR calc Af Amer: >60 mL/min (ref >60)
GFR calc non Af Amer: >60 mL/min (ref >60)
GLUCOSE: 107 mg/dL — AB (ref 70–99)
POTASSIUM: 3.2 mmol/L — AB (ref 3.5–5.1)
SODIUM: 139 mmol/L (ref 135–145)
TOTAL PROTEIN: 5.4 g/dL — AB (ref 6.5–8.1)

## 2018-08-04 LAB — CBC WITH DIFFERENTIAL/PLATELET
Basophils Absolute: 0 10*3/uL (ref 0.0–0.1)
Basophils Relative: 0 %
EOS PCT: 1 %
Eosinophils Absolute: 0.1 10*3/uL (ref 0.0–0.7)
HEMATOCRIT: 39 % (ref 39.0–52.0)
HEMOGLOBIN: 13.8 g/dL (ref 13.0–17.0)
LYMPHS PCT: 9 %
Lymphs Abs: 1.2 10*3/uL (ref 0.7–4.0)
MCH: 31.9 pg (ref 26.0–34.0)
MCHC: 35.4 g/dL (ref 30.0–36.0)
MCV: 90.3 fL (ref 78.0–100.0)
MONOS PCT: 10 %
Monocytes Absolute: 1.4 10*3/uL — ABNORMAL HIGH (ref 0.1–1.0)
NEUTROS ABS: 11 10*3/uL — AB (ref 1.7–7.7)
Neutrophils Relative %: 80 %
Platelets: 191 10*3/uL (ref 150–400)
RBC: 4.32 MIL/uL (ref 4.22–5.81)
RDW: 14.6 % (ref 11.5–15.5)
WBC: 13.7 10*3/uL — ABNORMAL HIGH (ref 4.0–10.5)

## 2018-08-04 MED ORDER — METOPROLOL TARTRATE 50 MG PO TABS
50.0000 mg | ORAL_TABLET | Freq: Two times a day (BID) | ORAL | Status: DC
  Administered 2018-08-05: 50 mg via ORAL
  Filled 2018-08-04 (×2): qty 1

## 2018-08-04 MED ORDER — METOPROLOL TARTRATE 25 MG PO TABS: 25.0000 mg | ORAL_TABLET | Freq: Once | ORAL | Status: DC

## 2018-08-04 MED ORDER — METOPROLOL TARTRATE 50 MG PO TABS: 75.0000 mg | ORAL_TABLET | Freq: Two times a day (BID) | ORAL | Status: DC

## 2018-08-04 NOTE — Progress Notes (Signed)
PROGRESS NOTE    Samuel Flores  WUJ:[id number] DOB: October 25, 1941 DOA: 07/29/2018 PCP: Alycia Rossetti, MD   Brief Narrative: Samuel Flores is a 77 y.o. male with a history of atrial fibrillation, alcohol abuse. He presented secondary to feeling weak and found to have concern for sepsis secondary to e. Coli bacteremia, in addition to atrial fibrillation with RVR. He is being treated with IV antibiotics.   Assessment & Plan:   Active Problems:   Hypotension   Sepsis ([place])   Pneumonia due to infectious organism   Alcoholic cirrhosis of liver with ascites ([facility])   Atrial fibrillation with RVR ([facility])   Acute respiratory failure with hypoxia ([place])   Sepsis Secondary to E. Coli bacteremia. Still with tachycardia in setting of atrial fibrillation -Continue Unasyn  E. Coli bacteremia Unknown source. Possibly secondary to SBP. -Management above  Atrial fibrillation with RVR In setting of sepsis. Complicated by mild hypotension. Cardiology consulted. Still tachycardic but improved. -Cardiology recommendations: Cardizem PO and metoprolol -Eliquis (will need to discuss with cardiology if this can be continued secondary to liver disease). -PT eval  Possible RLL pneumonia -Continue Unasyn/azithromycin  Acute respiratory failure with hypoxia In setting of possible pneumonia. Mild pleural effusion. Weaned to room air.  Acute kidney injury Baseline of 1. Up to 1.65 on admission. Resolved.  History of prostate cancer  Gout -Continue allopurinol  Ascites In setting of cirrhosis, likely alcoholic. S/p paracentesis on 9/19. Labs drawn. Abnormal. Question SBP as etiology for infection. Cytology and culture pending but patient has been on antibiotics. -Continue Lasix -Repeat ultrasound  Cirrhosis Likely alcoholic. Likely acute hepatitis on admission. Patient denies alcohol intake for the last 10 months so alcohol hepatitis unlikely. No alcohol level available from admission.  AST/ALT improved. Bilirubin elevated but downtrending. INR elevated. Ammonia normal. Hepatitis panel negative.  Elevated alkaline phosphatase Initial concern for possible cholecystitis. RUQ ultrasound without cholelithiasis, cholecystitis or ductal dilation. Trended down, slightly up now. -Repeat CMP in AM -Repeat ultrasound  Thrombocytopenia Acute in setting of infection. Resolved.  Constipation No evidence of ileus or SBO -Advance diet -Miralax  Left arm swelling Possibly secondary to low albumin/IV. No erythema or tenderness. Duplex negative for DVT   DVT prophylaxis: Eliquis Code Status:   Code Status: DNR Family Communication: None at bedside Disposition Plan: Discharge pending clinical improvement   Consultants:   9/17: Cardiology, Dr. [name]  Procedures:   None  Antimicrobials:  Vancomycin  Zosyn  Ceftriaxone  Azithromycin   Unasyn   Subjective: Some lightheadedness with standing.  Objective: Vitals:   08/03/18 2000 08/03/18 2110 08/04/18 0454 08/04/18 0934  BP:  117/72 119/64 122/62  Pulse: (!) 114 (!) 119 (!) 103 68  Resp:  20 20 20   Temp:  98.4 F (36.9 C) 98.4 F (36.9 C)   TempSrc:  Oral Oral   SpO2:  93% 94% 96%  Weight:   86.9 kg   Height:        Intake/Output Summary (Last 24 hours) at 08/04/2018 1338 Last data filed at 08/04/2018 0927 Gross per 24 hour  Intake 379.45 ml  Output 1400 ml  Net -1020.55 ml   Filed Weights   07/29/18 1432 08/04/18 0454  Weight: 82.9 kg 86.9 kg    Examination:  General exam: Appears calm and comfortable Respiratory system: Clear to auscultation. Respiratory effort normal. Cardiovascular system: S1 & S2 heard, fast rate, irregular rhythm. No murmurs, rubs, gallops or clicks. Gastrointestinal system: Abdomen is distenedd, soft and nontender. Normal bowel  sounds heard. Central nervous system: Alert and oriented. No focal neurological deficits. Extremities: No edema. No calf tenderness Skin: No  cyanosis. No rashes Psychiatry: Judgement and insight appear normal. Mood & affect appropriate.     Data Reviewed: I have personally reviewed following labs and imaging studies  CBC: Recent Labs  Lab 07/31/18 0304 08/01/18 0329 08/02/18 0343 08/03/18 0324 08/04/18 0448  WBC 13.4* 12.9* 13.7* 16.4* 13.7*  NEUTROABS 12.0* 11.0* 11.3* 13.4* 11.0*  HGB 14.5 13.9 13.2 14.0 13.8  HCT 42.5 39.9 37.5* 40.7 39.0  MCV 93.0 91.7 90.8 91.5 90.3  PLT 84* 87* 105* 162 614   Basic Metabolic Panel: Recent Labs  Lab 07/29/18 1455 07/30/18 0259 07/31/18 0304 08/01/18 0329 08/02/18 0343 08/03/18 0324 08/04/18 0448  NA  --  138 135 134* 133* 136 139  K  --  4.6 4.0 4.0 3.5 3.6 3.2*  CL  --  102 104 101 96* 98 100  CO2  --  24 22 24 27 26 28   GLUCOSE  --  121* 126* 116* 195* 110* 107*  BUN  --  22 15 16 19 21 18   CREATININE  --  1.22 0.65 0.78 0.86 0.83 0.82  CALCIUM  --  8.4* 7.6* 7.9* 7.6* 7.4* 7.6*  MG 1.3* 2.0  --   --   --   --   --    GFR: Estimated Creatinine Clearance: 79.8 mL/min (by C-G formula based on SCr of 0.82 mg/dL). Liver Function Tests: Recent Labs  Lab 07/31/18 0304 08/01/18 0329 08/02/18 0343 08/03/18 0324 08/04/18 0448  AST 48* 25 20 22 27   ALT 37 28 20 18 16   ALKPHOS 111 103 109 130* 164*  BILITOT 6.3* 5.6* 4.1* 3.0* 2.9*  PROT 5.3* 5.2* 5.3* 5.3* 5.4*  ALBUMIN 2.5* 2.5* 2.3* 2.2* 2.3*   Recent Labs  Lab 07/29/18 0904  LIPASE 28   Recent Labs  Lab 08/01/18 0852  AMMONIA 23   Coagulation Profile: Recent Labs  Lab 07/29/18 0904 08/01/18 0852  INR 1.64 1.62   Cardiac Enzymes: Recent Labs  Lab 07/29/18 0904  TROPONINI 0.04*   BNP (last 3 results) No results for input(s): PROBNP in the last 8760 hours. HbA1C: No results for input(s): HGBA1C in the last 72 hours. CBG: Recent Labs  Lab 08/02/18 1611  GLUCAP 111*   Lipid Profile: No results for input(s): CHOL, HDL, LDLCALC, TRIG, CHOLHDL, LDLDIRECT in the last 72 hours. Thyroid  Function Tests: No results for input(s): TSH, T4TOTAL, FREET4, T3FREE, THYROIDAB in the last 72 hours. Anemia Panel: No results for input(s): VITAMINB12, FOLATE, FERRITIN, TIBC, IRON, RETICCTPCT in the last 72 hours. Sepsis Labs: Recent Labs  Lab 07/29/18 0928 07/29/18 1150 07/30/18 1659  PROCALCITON  --  59.06  --   LATICACIDVEN 4.5* 3.6* 2.3*    Recent Results (from the past 240 hour(s))  Culture, blood (routine x 2)     Status: Abnormal   Collection Time: 07/29/18  9:04 AM  Result Value Ref Range Status   Specimen Description   Final    BLOOD LEFT ANTECUBITAL Performed at [facility], [place] [address] [place], [place] [address]    Special Requests   Final    BOTTLES DRAWN AEROBIC AND ANAEROBIC Blood Culture adequate volume Performed at [place] [address] [place],  [address]    Culture  Setup Time   Final    GRAM NEGATIVE RODS IN BOTH AEROBIC AND ANAEROBIC BOTTLES CRITICAL VALUE  NOTED.  VALUE IS CONSISTENT WITH PREVIOUSLY REPORTED AND CALLED VALUE.    Culture (A)  Final    ESCHERICHIA COLI SUSCEPTIBILITIES PERFORMED ON PREVIOUS CULTURE WITHIN THE LAST 5 DAYS. Performed at [facility], [place] [address], [place], [place] [address]    Report Status 08/01/2018 FINAL  Final  Culture, blood (routine x 2)     Status: Abnormal   Collection Time: 07/29/18  9:04 AM  Result Value Ref Range Status   Specimen Description   Final    BLOOD BLOOD RIGHT FOREARM Performed at [place] [address], [place], [place] [address]    Special Requests   Final    BOTTLES DRAWN AEROBIC AND ANAEROBIC Blood Culture results may not be optimal due to an excessive volume of blood received in culture bottles Performed at [place] [address] [place], [place] [address]    Culture  Setup Time   Final    IN BOTH AEROBIC AND ANAEROBIC BOTTLES GRAM NEGATIVE RODS CRITICAL RESULT CALLED TO,  READ BACK BY AND VERIFIED WITHLavell Flores PHARMD [id number] 07/30/18 A BROWNING Performed at [facility], [place] [address] [place], [place] [address]    Culture ESCHERICHIA COLI (A)  Final   Report Status 07/31/2018 FINAL  Final   Organism ID, Bacteria ESCHERICHIA COLI  Final      Susceptibility   Escherichia coli - MIC*    AMPICILLIN <=2 SENSITIVE Sensitive     CEFAZOLIN <=4 SENSITIVE Sensitive     CEFEPIME <=1 SENSITIVE Sensitive     CEFTAZIDIME <=1 SENSITIVE Sensitive     CEFTRIAXONE <=1 SENSITIVE Sensitive     CIPROFLOXACIN <=0.25 SENSITIVE Sensitive     GENTAMICIN <=1 SENSITIVE Sensitive     IMIPENEM <=0.25 SENSITIVE Sensitive     TRIMETH/SULFA <=20 SENSITIVE Sensitive     AMPICILLIN/SULBACTAM <=2 SENSITIVE Sensitive     PIP/TAZO <=4 SENSITIVE Sensitive     Extended ESBL NEGATIVE Sensitive     * ESCHERICHIA COLI  Blood Culture ID Panel (Reflexed)     Status: Abnormal   Collection Time: 07/29/18  9:04 AM  Result Value Ref Range Status   Enterococcus species NOT DETECTED NOT DETECTED Final   Listeria monocytogenes NOT DETECTED NOT DETECTED Final   Staphylococcus species NOT DETECTED NOT DETECTED Final   Staphylococcus aureus NOT DETECTED NOT DETECTED Final   Streptococcus species NOT DETECTED NOT DETECTED Final   Streptococcus agalactiae NOT DETECTED NOT DETECTED Final   Streptococcus pneumoniae NOT DETECTED NOT DETECTED Final   Streptococcus pyogenes NOT DETECTED NOT DETECTED Final   Acinetobacter baumannii NOT DETECTED NOT DETECTED Final   Enterobacteriaceae species DETECTED (A) NOT DETECTED Final    Comment: Enterobacteriaceae represent a large family of gram-negative bacteria, not a single organism. CRITICAL RESULT CALLED TO, READ BACK BY AND VERIFIED WITH: Samuel Flores PHARMD [id number] 07/30/18 A BROWNING    Enterobacter cloacae complex NOT DETECTED NOT DETECTED Final   Escherichia coli DETECTED (A) NOT DETECTED Final    Comment: CRITICAL RESULT CALLED TO, READ BACK BY AND  VERIFIED WITH: Samuel Flores PHARMD [id number] 07/30/18 A BROWNING    Klebsiella oxytoca NOT DETECTED NOT DETECTED Final   Klebsiella pneumoniae NOT DETECTED NOT DETECTED Final   Proteus species NOT DETECTED NOT DETECTED Final   Serratia marcescens NOT DETECTED NOT DETECTED Final   Carbapenem resistance NOT DETECTED NOT DETECTED Final   Haemophilus influenzae NOT DETECTED NOT DETECTED Final   Neisseria meningitidis NOT DETECTED  NOT DETECTED Final   Pseudomonas aeruginosa NOT DETECTED NOT DETECTED Final   Candida albicans NOT DETECTED NOT DETECTED Final   Candida glabrata NOT DETECTED NOT DETECTED Final   Candida krusei NOT DETECTED NOT DETECTED Final   Candida parapsilosis NOT DETECTED NOT DETECTED Final   Candida tropicalis NOT DETECTED NOT DETECTED Final    Comment: Performed at [facility], [place] [address] [place], [place] [address]  Culture, Urine     Status: None   Collection Time: 07/29/18  1:28 PM  Result Value Ref Range Status   Specimen Description   Final    URINE, RANDOM Performed at [place] [address], [place], [place] [address]    Special Requests   Final    NONE Performed at [facility], [place] [address] [place], [place] [address]    Culture   Final    NO GROWTH Performed at [facility], [place] [address], [place], [place] [address]    Report Status 08/01/2018 FINAL  Final  MRSA PCR Screening     Status: None   Collection Time: 07/29/18  2:54 PM  Result Value Ref Range Status   MRSA by PCR NEGATIVE NEGATIVE Final    Comment:        The GeneXpert MRSA Assay (FDA approved for NASAL specimens only), is one component of a comprehensive MRSA colonization surveillance program. It is not intended to diagnose MRSA infection nor to guide or monitor treatment for MRSA infections. Performed at [facility], [place] [address], [place], [place] [address]   Culture, body fluid-bottle     Status: None  (Preliminary result)   Collection Time: 08/01/18  3:38 PM  Result Value Ref Range Status   Specimen Description FLUID PERITONEAL  Final   Special Requests BOTTLES DRAWN AEROBIC AND ANAEROBIC  Final   Culture   Final    NO GROWTH 3 DAYS Performed at [facility], [place] [address] [place] [address], [place], [place] [address]    Report Status PENDING  Incomplete  Gram stain     Status: None   Collection Time: 08/01/18  3:38 PM  Result Value Ref Range Status   Specimen Description FLUID PERITONEAL  Final   Special Requests NONE  Final   Gram Stain   Final    ABUNDANT WBC PRESENT, PREDOMINANTLY PMN NO ORGANISMS SEEN Performed at [facility], [place] [address], [place], [place] [address]    Report Status 08/01/2018 FINAL  Final         Radiology Studies: No results found.      Scheduled Meds: . allopurinol  200 mg Oral Daily  . apixaban  5 mg Oral BID  . diltiazem  120 mg Oral Daily  . fluticasone  2 spray Each Nare Daily  . furosemide  20 mg Oral Daily  . mouth rinse  15 mL Mouth Rinse BID  . metoprolol tartrate  50 mg Oral BID  . polyethylene glycol  17 g Oral Daily  . sodium chloride flush  3 mL Intravenous Q12H   Continuous Infusions: . sodium chloride 10 mL/hr at 08/04/18 0600  . ampicillin-sulbactam (UNASYN) IV Stopped (08/04/18 0524)     LOS: 6 days     Cordelia Poche, MD [facility] 08/04/2018, 1:38 PM Pager: [phone]  If 7PM-7AM, please contact night-coverage [email or web address] 08/04/2018, 1:38 PM

## 2018-08-05 LAB — CBC
HCT: 44.5 % (ref 39.0–52.0)
Hemoglobin: 15.1 g/dL (ref 13.0–17.0)
MCH: 31.3 pg (ref 26.0–34.0)
MCHC: 33.9 g/dL (ref 30.0–36.0)
MCV: 92.1 fL (ref 78.0–100.0)
Platelets: 195 10*3/uL (ref 150–400)
RBC: 4.83 MIL/uL (ref 4.22–5.81)
RDW: 14.8 % (ref 11.5–15.5)
WBC: 12.9 10*3/uL — ABNORMAL HIGH (ref 4.0–10.5)

## 2018-08-05 LAB — COMPREHENSIVE METABOLIC PANEL
ALK PHOS: 173 U/L — AB (ref 38–126)
ALT: 18 U/L (ref 0–44)
AST: 31 U/L (ref 15–41)
Albumin: 2.4 g/dL — ABNORMAL LOW (ref 3.5–5.0)
Anion gap: 14 (ref 5–15)
BUN: 14 mg/dL (ref 8–23)
CO2: 27 mmol/L (ref 22–32)
Calcium: 7.7 mg/dL — ABNORMAL LOW (ref 8.9–10.3)
Chloride: 97 mmol/L — ABNORMAL LOW (ref 98–111)
Creatinine, Ser: 0.81 mg/dL (ref 0.61–1.24)
Glucose, Bld: 92 mg/dL (ref 70–99)
POTASSIUM: 2.7 mmol/L — AB (ref 3.5–5.1)
Sodium: 138 mmol/L (ref 135–145)
Total Bilirubin: 2.9 mg/dL — ABNORMAL HIGH (ref 0.3–1.2)
Total Protein: 6 g/dL — ABNORMAL LOW (ref 6.5–8.1)

## 2018-08-05 LAB — MAGNESIUM: Magnesium: 1.8 mg/dL (ref 1.7–2.4)

## 2018-08-05 MED ORDER — METOPROLOL TARTRATE 25 MG PO TABS: 12.5000 mg | ORAL_TABLET | Freq: Two times a day (BID) | ORAL | Status: DC

## 2018-08-05 MED ORDER — POTASSIUM CHLORIDE CRYS ER 20 MEQ PO TBCR
40.0000 meq | EXTENDED_RELEASE_TABLET | ORAL | Status: AC
  Administered 2018-08-05 (×2): 40 meq via ORAL
  Filled 2018-08-05 (×2): qty 2

## 2018-08-05 MED ORDER — LORATADINE 10 MG PO TABS
10.0000 mg | ORAL_TABLET | Freq: Every day | ORAL | Status: DC
  Administered 2018-08-05 – 2018-08-10 (×6): 10 mg via ORAL
  Filled 2018-08-05 (×6): qty 1

## 2018-08-05 MED ORDER — AMOXICILLIN-POT CLAVULANATE 875-125 MG PO TABS
1.0000 | ORAL_TABLET | Freq: Two times a day (BID) | ORAL | Status: AC
  Administered 2018-08-05 – 2018-08-06 (×2): 1 via ORAL
  Filled 2018-08-05 (×2): qty 1

## 2018-08-05 MED ORDER — POTASSIUM CHLORIDE CRYS ER 20 MEQ PO TBCR: 40.0000 meq | EXTENDED_RELEASE_TABLET | Freq: Two times a day (BID) | ORAL | Status: DC

## 2018-08-05 MED ORDER — METOPROLOL TARTRATE 25 MG PO TABS
25.0000 mg | ORAL_TABLET | Freq: Two times a day (BID) | ORAL | Status: DC
  Administered 2018-08-05 – 2018-08-10 (×10): 25 mg via ORAL
  Filled 2018-08-05 (×10): qty 1

## 2018-08-05 NOTE — Progress Notes (Signed)
PROGRESS NOTE    Samuel Flores  XBW:[id number] DOB: 1941-10-12 DOA: 07/29/2018 PCP: Alycia Rossetti, MD   Brief Narrative: Samuel Flores is a 77 y.o. male with a history of atrial fibrillation, alcohol abuse. He presented secondary to feeling weak and found to have concern for sepsis secondary to e. Coli bacteremia, in addition to atrial fibrillation with RVR. He is being treated with IV antibiotics.   Assessment & Plan:   Active Problems:   Hypotension   Sepsis ([place])   Pneumonia due to infectious organism   Alcoholic cirrhosis of liver with ascites (H[facility])   Atrial fibrillation with RVR (H[facility])   Acute respiratory failure with hypoxia ([place])   Sepsis Secondary to E. Coli bacteremia. Still with tachycardia in setting of atrial fibrillation -Continue Unasyn  E. Coli bacteremia Unknown source. Possibly secondary to SBP. -Management above  Atrial fibrillation with RVR In setting of sepsis. Complicated by mild hypotension. Cardiology consulted. Still tachycardic but improved. -Cardiology recommendations: Cardizem PO and metoprolol -Eliquis (will need to discuss with cardiology if this can be continued secondary to liver disease). -PT eval -Decrease to metoprolol 25 mg secondary to significant orthostatic hypotension  Possible RLL pneumonia Treated with Unasyn/Azithromycin. Completed course  Acute respiratory failure with hypoxia In setting of possible pneumonia. Mild pleural effusion. Weaned to room air. Resolved.  Acute kidney injury Baseline of 1. Up to 1.65 on admission. Resolved.  History of prostate cancer  Gout -Continue allopurinol  Ascites In setting of cirrhosis, likely alcoholic. S/p paracentesis on 9/19. Labs drawn. Abnormal. Question SBP as etiology for infection. Cytology and culture pending but patient has been on antibiotics. -Continue Lasix  Cirrhosis Likely alcoholic. Likely acute hepatitis on admission. Patient denies alcohol intake for the  last 10 months so alcohol hepatitis unlikely. No alcohol level available from admission. AST/ALT improved. Bilirubin elevated but downtrending. INR elevated. Ammonia normal. Hepatitis panel negative.  Elevated alkaline phosphatase Initial concern for possible cholecystitis. RUQ ultrasound without cholelithiasis, cholecystitis or ductal dilation. Trended down, slightly up now. -Repeat CMP in AM -Repeat ultrasound  Thrombocytopenia Acute in setting of infection. Resolved.  Constipation No evidence of ileus or SBO -Advance diet -Miralax  Left arm swelling Possibly secondary to low albumin/IV. No erythema or tenderness. Duplex negative for DVT  Hypokalemia Secondary to lasix -Potassium supplementation  NSVT Reported on telemetry. Does not appear to be VT on my review. Cardiology recommendations.   DVT prophylaxis: Eliquis Code Status:   Code Status: DNR Family Communication: Wife on telephone Disposition Plan: Discharge pending clinical improvement and improvement of heart rate/BP   Consultants:   9/17: Cardiology, Dr. [name]  Procedures:     Antimicrobials:  Vancomycin  Zosyn  Ceftriaxone  Azithromycin   Unasyn   Subjective: Some nasal congestion. Mild lower abdominal pain.  Objective: Vitals:   08/04/18 1513 08/04/18 2119 08/05/18 0554 08/05/18 0846  BP: (!) 114/58 (!) 101/59 120/62   Pulse: 79 60 (!) 51   Resp: 20 18 18    Temp: 98.4 F (36.9 C) 98 F (36.7 C) 97.8 F (36.6 C)   TempSrc: Oral Oral Oral   SpO2: 96% 93% 97% 93%  Weight:      Height:        Intake/Output Summary (Last 24 hours) at 08/05/2018 1337 Last data filed at 08/05/2018 1037 Gross per 24 hour  Intake 2604.46 ml  Output 2275 ml  Net 329.46 ml   Filed Weights   07/29/18 1432 08/04/18 0454  Weight: 82.9 kg 86.9 kg  Examination:  General exam: Appears calm and comfortable Respiratory system: Diminished breath sounds. Respiratory effort normal. Cardiovascular system:  S1 & S2 heard, fast rate with irregular rhythm. No murmurs. Gastrointestinal system: Abdomen is distended, soft and nontender. Normal bowel sounds heard. Central nervous system: Alert and oriented. No focal neurological deficits. Extremities: No calf tenderness Skin: No cyanosis. No rashes Psychiatry: Judgement and insight appear normal. Mood & affect appropriate.     Data Reviewed: I have personally reviewed following labs and imaging studies  CBC: Recent Labs  Lab 07/31/18 0304 08/01/18 0329 08/02/18 0343 08/03/18 0324 08/04/18 0448 08/05/18 0500  WBC 13.4* 12.9* 13.7* 16.4* 13.7* 12.9*  NEUTROABS 12.0* 11.0* 11.3* 13.4* 11.0*  --   HGB 14.5 13.9 13.2 14.0 13.8 15.1  HCT 42.5 39.9 37.5* 40.7 39.0 44.5  MCV 93.0 91.7 90.8 91.5 90.3 92.1  PLT 84* 87* 105* 162 191 329   Basic Metabolic Panel: Recent Labs  Lab 07/29/18 1455 07/30/18 0259  08/01/18 0329 08/02/18 0343 08/03/18 0324 08/04/18 0448 08/05/18 0500  NA  --  138   < > 134* 133* 136 139 138  K  --  4.6   < > 4.0 3.5 3.6 3.2* 2.7*  CL  --  102   < > 101 96* 98 100 97*  CO2  --  24   < > 24 27 26 28 27   GLUCOSE  --  121*   < > 116* 195* 110* 107* 92  BUN  --  22   < > 16 19 21 18 14   CREATININE  --  1.22   < > 0.78 0.86 0.83 0.82 0.81  CALCIUM  --  8.4*   < > 7.9* 7.6* 7.4* 7.6* 7.7*  MG 1.3* 2.0  --   --   --   --   --  1.8   < > = values in this interval not displayed.   GFR: Estimated Creatinine Clearance: 80.7 mL/min (by C-G formula based on SCr of 0.81 mg/dL). Liver Function Tests: Recent Labs  Lab 08/01/18 0329 08/02/18 0343 08/03/18 0324 08/04/18 0448 08/05/18 0500  AST 25 20 22 27 31   ALT 28 20 18 16 18   ALKPHOS 103 109 130* 164* 173*  BILITOT 5.6* 4.1* 3.0* 2.9* 2.9*  PROT 5.2* 5.3* 5.3* 5.4* 6.0*  ALBUMIN 2.5* 2.3* 2.2* 2.3* 2.4*   No results for input(s): LIPASE, AMYLASE in the last 168 hours. Recent Labs  Lab 08/01/18 0852  AMMONIA 23   Coagulation Profile: Recent Labs  Lab  08/01/18 0852  INR 1.62   Cardiac Enzymes: No results for input(s): CKTOTAL, CKMB, CKMBINDEX, TROPONINI in the last 168 hours. BNP (last 3 results) No results for input(s): PROBNP in the last 8760 hours. HbA1C: No results for input(s): HGBA1C in the last 72 hours. CBG: Recent Labs  Lab 08/02/18 1611  GLUCAP 111*   Lipid Profile: No results for input(s): CHOL, HDL, LDLCALC, TRIG, CHOLHDL, LDLDIRECT in the last 72 hours. Thyroid Function Tests: No results for input(s): TSH, T4TOTAL, FREET4, T3FREE, THYROIDAB in the last 72 hours. Anemia Panel: No results for input(s): VITAMINB12, FOLATE, FERRITIN, TIBC, IRON, RETICCTPCT in the last 72 hours. Sepsis Labs: Recent Labs  Lab 07/30/18 1659  LATICACIDVEN 2.3*    Recent Results (from the past 240 hour(s))  Culture, blood (routine x 2)     Status: Abnormal   Collection Time: 07/29/18  9:04 AM  Result Value Ref Range Status   Specimen Description   Final  BLOOD LEFT ANTECUBITAL Performed at [facility] [address], [place], [place] [address]    Special Requests   Final    BOTTLES DRAWN AEROBIC AND ANAEROBIC Blood Culture adequate volume Performed at [facility] [address] [place], [place] [address]    Culture  Setup Time   Final    GRAM NEGATIVE RODS IN BOTH AEROBIC AND ANAEROBIC BOTTLES CRITICAL VALUE NOTED.  VALUE IS CONSISTENT WITH PREVIOUSLY REPORTED AND CALLED VALUE.    Culture (A)  Final    ESCHERICHIA COLI SUSCEPTIBILITIES PERFORMED ON PREVIOUS CULTURE WITHIN THE LAST 5 DAYS. Performed at [facility], [place] [address], [place], [place] [address]    Report Status 08/01/2018 FINAL  Final  Culture, blood (routine x 2)     Status: Abnormal   Collection Time: 07/29/18  9:04 AM  Result Value Ref Range Status   Specimen Description   Final    BLOOD BLOOD RIGHT FOREARM Performed at [facility] [address], [place], [place] [address]     Special Requests   Final    BOTTLES DRAWN AEROBIC AND ANAEROBIC Blood Culture results may not be optimal due to an excessive volume of blood received in culture bottles Performed at [facility] [address] [place], [place] [address]    Culture  Setup Time   Final    IN BOTH AEROBIC AND ANAEROBIC BOTTLES GRAM NEGATIVE RODS CRITICAL RESULT CALLED TO, READ BACK BY AND VERIFIED WITHLavell Luster PHARMD [id number] 07/30/18 A BROWNING Performed at [facility], [place] [address], [place], [place] [address]    Culture ESCHERICHIA COLI (A)  Final   Report Status 07/31/2018 FINAL  Final   Organism ID, Bacteria ESCHERICHIA COLI  Final      Susceptibility   Escherichia coli - MIC*    AMPICILLIN <=2 SENSITIVE Sensitive     CEFAZOLIN <=4 SENSITIVE Sensitive     CEFEPIME <=1 SENSITIVE Sensitive     CEFTAZIDIME <=1 SENSITIVE Sensitive     CEFTRIAXONE <=1 SENSITIVE Sensitive     CIPROFLOXACIN <=0.25 SENSITIVE Sensitive     GENTAMICIN <=1 SENSITIVE Sensitive     IMIPENEM <=0.25 SENSITIVE Sensitive     TRIMETH/SULFA <=20 SENSITIVE Sensitive     AMPICILLIN/SULBACTAM <=2 SENSITIVE Sensitive     PIP/TAZO <=4 SENSITIVE Sensitive     Extended ESBL NEGATIVE Sensitive     * ESCHERICHIA COLI  Blood Culture ID Panel (Reflexed)     Status: Abnormal   Collection Time: 07/29/18  9:04 AM  Result Value Ref Range Status   Enterococcus species NOT DETECTED NOT DETECTED Final   Listeria monocytogenes NOT DETECTED NOT DETECTED Final   Staphylococcus species NOT DETECTED NOT DETECTED Final   Staphylococcus aureus NOT DETECTED NOT DETECTED Final   Streptococcus species NOT DETECTED NOT DETECTED Final   Streptococcus agalactiae NOT DETECTED NOT DETECTED Final   Streptococcus pneumoniae NOT DETECTED NOT DETECTED Final   Streptococcus pyogenes NOT DETECTED NOT DETECTED Final   Acinetobacter baumannii NOT DETECTED NOT DETECTED Final   Enterobacteriaceae species DETECTED (A) NOT DETECTED Final     Comment: Enterobacteriaceae represent a large family of gram-negative bacteria, not a single organism. CRITICAL RESULT CALLED TO, READ BACK BY AND VERIFIED WITH: Lavell Luster PHARMD [id number] 07/30/18 A BROWNING    Enterobacter cloacae complex NOT DETECTED NOT DETECTED Final   Escherichia coli DETECTED (A) NOT DETECTED Final    Comment: CRITICAL RESULT CALLED TO, READ  BACK BY AND VERIFIED WITH: Lavell Luster PHARMD [id number] 07/30/18 A BROWNING    Klebsiella oxytoca NOT DETECTED NOT DETECTED Final   Klebsiella pneumoniae NOT DETECTED NOT DETECTED Final   Proteus species NOT DETECTED NOT DETECTED Final   Serratia marcescens NOT DETECTED NOT DETECTED Final   Carbapenem resistance NOT DETECTED NOT DETECTED Final   Haemophilus influenzae NOT DETECTED NOT DETECTED Final   Neisseria meningitidis NOT DETECTED NOT DETECTED Final   Pseudomonas aeruginosa NOT DETECTED NOT DETECTED Final   Candida albicans NOT DETECTED NOT DETECTED Final   Candida glabrata NOT DETECTED NOT DETECTED Final   Candida krusei NOT DETECTED NOT DETECTED Final   Candida parapsilosis NOT DETECTED NOT DETECTED Final   Candida tropicalis NOT DETECTED NOT DETECTED Final    Comment: Performed at [facility], [place] [address] [place], [place] [address]  Culture, Urine     Status: None   Collection Time: 07/29/18  1:28 PM  Result Value Ref Range Status   Specimen Description   Final    URINE, RANDOM Performed at [facility] [address], [place], [place] [address]    Special Requests   Final    NONE Performed at [facility], [place] [address] [place], [place] [address]    Culture   Final    NO GROWTH Performed at [facility], [place] [address], [place], [place] [address]    Report Status 08/01/2018 FINAL  Final  MRSA PCR Screening     Status: None   Collection Time: 07/29/18  2:54 PM  Result Value Ref Range Status   MRSA by PCR NEGATIVE NEGATIVE Final    Comment:        The  GeneXpert MRSA Assay (FDA approved for NASAL specimens only), is one component of a comprehensive MRSA colonization surveillance program. It is not intended to diagnose MRSA infection nor to guide or monitor treatment for MRSA infections. Performed at [facility], [place] [address], [place], [place] [address]   Culture, body fluid-bottle     Status: None (Preliminary result)   Collection Time: 08/01/18  3:38 PM  Result Value Ref Range Status   Specimen Description FLUID PERITONEAL  Final   Special Requests BOTTLES DRAWN AEROBIC AND ANAEROBIC  Final   Culture   Final    NO GROWTH 4 DAYS Performed at [facility], [place] [address] [place], [place] [address]    Report Status PENDING  Incomplete  Gram stain     Status: None   Collection Time: 08/01/18  3:38 PM  Result Value Ref Range Status   Specimen Description FLUID PERITONEAL  Final   Special Requests NONE  Final   Gram Stain   Final    ABUNDANT WBC PRESENT, PREDOMINANTLY PMN NO ORGANISMS SEEN Performed at [facility], [place] [address], [place],  [address]    Report Status 08/01/2018 FINAL  Final         Radiology Studies: Dg Chest Port 1 View  Result Date: 08/04/2018 CLINICAL DATA:  Shortness of breath EXAM: PORTABLE CHEST 1 VIEW COMPARISON:  08/01/2018 FINDINGS: Cardiomegaly with vascular congestion. Improving aeration in the lung bases. Continued mild bibasilar atelectasis. No visible significant effusions. IMPRESSION: Cardiomegaly with vascular congestion and bibasilar atelectasis. Improving aeration in the bases since prior study. Electronically Signed   By: Rolm Baptise M.D.   On: 08/04/2018 16:01   US Abdomen Limited Ruq  Result Date: 08/04/2018 CLINICAL DATA:  Elevated alk-phos, ascites EXAM: ULTRASOUND ABDOMEN LIMITED RIGHT UPPER QUADRANT COMPARISON:  CT abdomen/pelvis dated 07/31/2018 FINDINGS: Gallbladder: No gallstones. Layering gallbladder sludge. Mild gallbladder wall  thickening. No pericholecystic fluid. Negative sonographic Murphy's sign. Common bile duct: Diameter: 5 mm Liver: Coarse hepatic echotexture. Mildly nodular hepatic contour. No focal hepatic lesion is seen. Portal vein is patent on color Doppler imaging with normal direction of blood flow towards the liver. Additional comments: Mild perihepatic ascites. IMPRESSION: Coarse hepatic echotexture and mildly nodular hepatic contour, raising the possibility of cirrhosis. Mild gallbladder wall thickening with layering gallbladder sludge. This appearance is likely reactive. No associated sonographic findings to suggest acute cholecystitis. Mild perihepatic ascites. Electronically Signed   By: Julian Hy M.D.   On: 08/04/2018 13:45        Scheduled Meds: . allopurinol  200 mg Oral Daily  . apixaban  5 mg Oral BID  . diltiazem  120 mg Oral Daily  . fluticasone  2 spray Each Nare Daily  . furosemide  20 mg Oral Daily  . mouth rinse  15 mL Mouth Rinse BID  . metoprolol tartrate  50 mg Oral BID  . polyethylene glycol  17 g Oral Daily  . potassium chloride  40 mEq Oral Q4H  . sodium chloride flush  3 mL Intravenous Q12H   Continuous Infusions: . sodium chloride 10 mL/hr at 08/05/18 0600  . ampicillin-sulbactam (UNASYN) IV 3 g (08/05/18 0905)     LOS: 7 days     Cordelia Poche, MD [facility] 08/05/2018, 1:37 PM Pager: [phone]  If 7PM-7AM, please contact night-coverage [email or web address] 08/05/2018, 1:37 PM

## 2018-08-05 NOTE — Progress Notes (Signed)
Subjective:  No chest pain Stable shortness of breath  6 beat NSVT seen on monitor.  Objective:  Vital Signs in the last 24 hours: Temp:  [97.8 F (36.6 C)-98 F (36.7 C)] 98 F (36.7 C) (09/23 1504) Pulse Rate:  [51-60] 60 (09/23 1504) Resp:  [18-22] 22 (09/23 1504) BP: (101-120)/(59-63) 109/63 (09/23 1504) SpO2:  [93 %-98 %] 98 % (09/23 1504)  Intake/Output from previous day: 09/22 0701 - 09/23 0700 In: 2844.5 [P.O.:960; I.V.:149.4; IV Piggyback:1735.1] Out: 2350 [Urine:2350] Intake/Output from this shift: Total I/O In: 1347.4 [I.V.:100.4; IV Piggyback:1246.9] Out: 725 [Urine:725]  Physical Exam: Nursing note and vitals reviewed. Constitutional: He is oriented to person, place, and time. He appears well-developed and well-nourished. No distress.  HENT:  Head: Normocephalic and atraumatic.  Eyes: Pupils are equal, round, and reactive to light. Conjunctivae are normal.  Neck: No JVD present.  Cardiovascular: Intact distal pulses.  No murmur heard. Irregularly irregular, tachycardic  Respiratory: Effort normal.  Diminished breath sounds Rt base GI: Soft. Bowel sounds are normal. There is no tenderness. There is no rebound.  Musculoskeletal: He exhibits no edema.  Neurological: He is alert and oriented to person, place, and time. No cranial nerve deficit.  Skin: Skin is warm and dry.  Psychiatric: He has a normal mood and affect.    Lab Results: Recent Labs    08/04/18 0448 08/05/18 0500  WBC 13.7* 12.9*  HGB 13.8 15.1  PLT 191 195   Recent Labs    08/04/18 0448 08/05/18 0500  NA 139 138  K 3.2* 2.7*  CL 100 97*  CO2 28 27  GLUCOSE 107* 92  BUN 18 14  CREATININE 0.82 0.81   No results for input(s): TROPONINI in the last 72 hours.  Invalid input(s): CK, MB Hepatic Function Panel Recent Labs    08/05/18 0500  PROT 6.0*  ALBUMIN 2.4*  AST 31  ALT 18  ALKPHOS 173*  BILITOT 2.9*   Cardiac studies: EKG 07/30/2018: Afib w/RVR. LAD. Old  inferior infarct. No acute ischemic changes  Echocardiogram 10/01/2017: Study Conclusions  - Left ventricle: The cavity size was normal. There was mild concentric hypertrophy. Systolic function was normal. The estimated ejection fraction was in the range of 50% to 55%. Regional wall motion abnormalities cannot be excluded. The study is not technically sufficient to allow evaluation of LV diastolic function. - Left atrium: The atrium was moderately dilated. - Right ventricle: The cavity size was mildly dilated. Wall thickness was normal. - Atrial septum: No defect or patent foramen ovale was identified. - Pulmonary arteries: Systolic pressure was mildly increased. PA peak pressure: 32 mm Hg (S).  Assessment/Recommendations:  77 y/o [place] male with persistent Afib, oderate CAD (cath 2014), hypertension, hyperlipidemia, liver cirrhosis, h/o TIA, h/o subdural hematoma, prostate Ca, admitted with sepsis, likely source being pneumonia vs ?SBP  A. fib with RVR:  CHA2DS2VAsc score 3, annual stroke risk 3.2%  RVR is response to patient's underlying sepsis, and not being on baseline home medications owing to low blood pressure in the setting of sepsis. In spite pf borderline low blood pressure he would benefit from combination of low dose beta blocker and CCB. This will also be beneficial given his short runs of nonsustained VT. Recommend reintroducing metoprolol, which had controlled his ventricular rate well. If continues to have RVR, would add digoxin, at 125 mcg daily.  Would not recommend cardioversion, as the patient may not be candidate for anticoagulation.  Patient has been on eliquis outpatient. He  has had cirrhosis of liver, although there is increase in liver enzymes acutely, possibly due to acute hepatitis. Etiology remains uncertain. No anticoagulant, including DOAC and warfarin is completely safe in liver cirrhosis. I discussed the risks/benefits with the patient. He  would like to stop eliquis given the bleeding risks. He understands the stroke risks.  Sepsis: Improving. Management as per the primary team  Liver cirrhosis: Acute hepatitis with underlying cirrhosis, management per the primary team  Hypokalemia: Consider K replacment History of hypertension Hyper lipidemia Moderate coronary artery disease History of TIA, subdural hematoma H/o prostate cancer   LOS: 7 days    Samuel Flores 08/05/2018, 6:04 PM  Samuel Flores Esther Hardy, MD [facility] [place] Pager: [phone] Office: [phone] If no answer Cell [phone]

## 2018-08-05 NOTE — Progress Notes (Addendum)
Physical Therapy Treatment Patient Details Name: JAMARQUIS CRULL MRN: [id number] DOB: Jan 07, 1941 Today's Date: 08/05/2018    History of Present Illness Pt admitted from home and dx with sepsis, hypotension, PNA, acute resp failure, AKI, A-fib with RVR and Ascites.  Pt with hx of TIA, CAD, subdural hematoma, back surgery and ETOH abuse    PT Comments    Progressing with mobility. Dizziness with position changes-possibly orthostatic. Pt c/o congestion.   Follow Up Recommendations  Supervision for mobility/OOB(HHPT if pt will agree to it)     Equipment Recommendations  None recommended by PT    Recommendations for Other Services       Precautions / Restrictions Precautions Precautions: Fall Restrictions Weight Bearing Restrictions: No    Mobility  Bed Mobility Overal bed mobility: Modified Independent                Transfers Overall transfer level: Needs assistance Equipment used: Rolling walker (2 wheeled) Transfers: Sit to/from Stand Sit to Stand: Min guard;From elevated surface         General transfer comment: VCS safety, hand placement. Close guard for safety  Ambulation/Gait Ambulation/Gait assistance: Min guard Gait Distance (Feet): 150 Feet Assistive device: Rolling walker (2 wheeled) Gait Pattern/deviations: Step-through pattern;Decreased stride length     General Gait Details: Close guard for safety. Several standing rest breaks due bowel urgency (false alarms). Pt tolerated distance well.    Stairs             Wheelchair Mobility    Modified Rankin (Stroke Patients Only)       Balance                                            Cognition Arousal/Alertness: Awake/alert Behavior During Therapy: WFL for tasks assessed/performed Overall Cognitive Status: Within Functional Limits for tasks assessed                                        Exercises      General Comments        Pertinent  Vitals/Pain Pain Assessment: No/denies pain    Home Living                      Prior Function            PT Goals (current goals can now be found in the care plan section) Progress towards PT goals: Progressing toward goals    Frequency    Min 3X/week      PT Plan Current plan remains appropriate    Co-evaluation              AM-PAC PT "6 Clicks" Daily Activity  Outcome Measure  Difficulty turning over in bed (including adjusting bedclothes, sheets and blankets)?: None Difficulty moving from lying on back to sitting on the side of the bed? : A Little Difficulty sitting down on and standing up from a chair with arms (e.g., wheelchair, bedside commode, etc,.)?: A Little Help needed moving to and from a bed to chair (including a wheelchair)?: A Little Help needed walking in hospital room?: A Little Help needed climbing 3-5 steps with a railing? : A Lot 6 Click Score: 18    End of Session Equipment Utilized During Treatment: Gait  belt Activity Tolerance: Patient tolerated treatment well Patient left: in bed;with call bell/phone within reach;with bed alarm set;with family/visitor present   PT Visit Diagnosis: History of falling (Z91.81);Unsteadiness on feet (R26.81);Muscle weakness (generalized) (M62.81)     Time: 6295-2841 PT Time Calculation (min) (ACUTE ONLY): 16 min  Charges:  $Gait Training: 8-22 mins                        Weston Anna, PT Acute Rehabilitation Services Pager: [phone] Office: [phone]

## 2018-08-05 NOTE — Progress Notes (Signed)
[facility] office at [phone], spoke with Butch Penny per Dr Lisbeth Ply request to have f/u on patient due to frequent beats of VTach, soft BPs, and medication dosing, she placed me on hold and instructed that she spoke with Dr Virgina Jock and he will follow up with patient later this afternoon

## 2018-08-06 LAB — COMPREHENSIVE METABOLIC PANEL
ALBUMIN: 2.4 g/dL — AB (ref 3.5–5.0)
ALT: 17 U/L (ref 0–44)
ANION GAP: 13 (ref 5–15)
AST: 33 U/L (ref 15–41)
Alkaline Phosphatase: 165 U/L — ABNORMAL HIGH (ref 38–126)
BUN: 10 mg/dL (ref 8–23)
CO2: 29 mmol/L (ref 22–32)
Calcium: 8 mg/dL — ABNORMAL LOW (ref 8.9–10.3)
Chloride: 98 mmol/L (ref 98–111)
Creatinine, Ser: 0.83 mg/dL (ref 0.61–1.24)
GFR calc non Af Amer: >60 mL/min (ref >60)
GLUCOSE: 154 mg/dL — AB (ref 70–99)
POTASSIUM: 3.8 mmol/L (ref 3.5–5.1)
Sodium: 140 mmol/L (ref 135–145)
Total Bilirubin: 2.3 mg/dL — ABNORMAL HIGH (ref 0.3–1.2)
Total Protein: 5.7 g/dL — ABNORMAL LOW (ref 6.5–8.1)

## 2018-08-06 LAB — CULTURE, BODY FLUID-BOTTLE: CULTURE: NO GROWTH

## 2018-08-06 LAB — CULTURE, BODY FLUID W GRAM STAIN -BOTTLE

## 2018-08-06 LAB — POTASSIUM: Potassium: 3.3 mmol/L — ABNORMAL LOW (ref 3.5–5.1)

## 2018-08-06 MED ORDER — DIGOXIN 125 MCG PO TABS: 0.1250 mg | ORAL_TABLET | Freq: Every day | ORAL | Status: DC

## 2018-08-06 MED ORDER — POTASSIUM CHLORIDE CRYS ER 20 MEQ PO TBCR
40.0000 meq | EXTENDED_RELEASE_TABLET | ORAL | Status: DC
  Administered 2018-08-06: 40 meq via ORAL
  Filled 2018-08-06: qty 2

## 2018-08-06 MED ORDER — DIGOXIN 125 MCG PO TABS
0.1250 mg | ORAL_TABLET | Freq: Every day | ORAL | Status: DC
  Administered 2018-08-07 – 2018-08-10 (×4): 0.125 mg via ORAL
  Filled 2018-08-06 (×4): qty 1

## 2018-08-06 MED ORDER — AMOXICILLIN-POT CLAVULANATE 400-57 MG/5ML PO SUSR
875.0000 mg | Freq: Two times a day (BID) | ORAL | Status: DC
  Administered 2018-08-06 – 2018-08-08 (×4): 875 mg via ORAL
  Filled 2018-08-06 (×4): qty 10.9

## 2018-08-06 MED ORDER — DIGOXIN 125 MCG PO TABS
0.2500 mg | ORAL_TABLET | Freq: Four times a day (QID) | ORAL | Status: AC
  Administered 2018-08-06 (×2): 0.25 mg via ORAL
  Filled 2018-08-06 (×2): qty 2

## 2018-08-06 NOTE — Progress Notes (Signed)
Received call form [facility] regarding patient's scan. Patient will no longer have it done today but will have it around 8 or 9 in the morning. Patient is to be NPO after midnight and unable tor receive any pain medication for 6 hours prior to scan.  Patient has been informed and will pass along to oncoming shift RN.

## 2018-08-06 NOTE — Progress Notes (Signed)
PROGRESS NOTE    MURAT RIDEOUT  PRX:[id number] DOB: [birth year] DOA: 07/29/2018 PCP: Alycia Rossetti, MD   Brief Narrative: Samuel Flores is a 77 y.o. male with a history of atrial fibrillation, alcohol abuse. He presented secondary to feeling weak and found to have concern for sepsis secondary to e. Coli bacteremia, in addition to atrial fibrillation with RVR. He is being treated with IV antibiotics with improvement. Found to have abdominal distension. Ultrasound significant for evidence of cirrhosis and ascites. S/p paracentesis. Ultrasound also significant for sludge with elevated alkaline phosphatase. HIDA ordered. Atrial fibrillation uncontrolled; cardiology on board.   Assessment & Plan:   Active Problems:   Hypotension   Sepsis ([place])   Pneumonia due to infectious organism   Alcoholic cirrhosis of liver with ascites ([facility])   Atrial fibrillation with RVR ([facility])   Acute respiratory failure with hypoxia ([place])   Sepsis Secondary to E. Coli bacteremia. Still with tachycardia in setting of atrial fibrillation -Continue Augmentin; 14 day total  E. Coli bacteremia Unknown source. Possibly secondary to SBP. -Management above  Atrial fibrillation with RVR In setting of sepsis. Complicated by hypotension and orthostatic hypotension. Cardiology consulted. Eliquis discontinued secondary to liver disease. Still uncontrolled. -Cardiology recommendations: Cardizem PO and metoprolol -PT eval: home health if he agrees -Continue metoprolol 25 mg secondary to significant orthostatic hypotension  Possible RLL pneumonia Treated with Unasyn/Azithromycin. Completed course  Acute respiratory failure with hypoxia In setting of possible pneumonia. Mild pleural effusion. Weaned to room air. Resolved.  Acute kidney injury Baseline of 1. Up to 1.65 on admission. Resolved.  History of prostate cancer  Gout -Continue allopurinol  Ascites In setting of cirrhosis, likely alcoholic. S/p  paracentesis on 9/19. Labs drawn. Abnormal. Question SBP as etiology for infection. Cytology and culture pending but patient has been on antibiotics. -Continue Lasix  Cirrhosis Likely alcoholic. Likely acute hepatitis on admission. Patient denies alcohol intake for the last 10 months so alcohol hepatitis unlikely. No alcohol level available from admission. AST/ALT improved. Bilirubin elevated but downtrending. INR elevated. Ammonia normal. Hepatitis panel negative.  Elevated alkaline phosphatase Initial concern for possible cholecystitis. RUQ ultrasound without cholelithiasis, cholecystitis or ductal dilation. Trended down, slightly up now. Has some abdominal pain today that is a little lower than expected for gallbladder. -Repeat CMP -HIDA scan  Thrombocytopenia Acute in setting of infection. Resolved.  Constipation No evidence of ileus or SBO -Advance diet -Miralax  Left arm swelling Possibly secondary to low albumin/IV. No erythema or tenderness. Duplex negative for DVT  Hypokalemia Secondary to lasix. Improved. -Potassium supplementation as needed  NSVT Reported on telemetry. Cardiology recommending beta blocker as already prescribed   DVT prophylaxis: Eliquis Code Status:   Code Status: DNR Family Communication: Wife on telephone Disposition Plan: Discharge pending clinical improvement and improvement of heart rate/BP   Consultants:   9/17: Cardiology, Dr. [name]  Procedures:     Antimicrobials:  Vancomycin  Zosyn  Ceftriaxone  Azithromycin   Unasyn   Subjective: Nasal congestion better. Some right periumbilical pain.  Objective: Vitals:   08/05/18 1504 08/05/18 2030 08/06/18 0529 08/06/18 0911  BP: 109/63 115/69 116/75 104/68  Pulse: 60 (!) 106 (!) 121 70  Resp: (!) 22 17 18 18   Temp: 98 F (36.7 C) 97.8 F (36.6 C) 98.2 F (36.8 C) 98 F (36.7 C)  TempSrc: Oral Oral Oral Oral  SpO2: 98% 94% 96% 96%  Weight:      Height:  Intake/Output Summary (Last 24 hours) at 08/06/2018 1048 Last data filed at 08/06/2018 0941 Gross per 24 hour  Intake 1947.31 ml  Output 1100 ml  Net 847.31 ml   Filed Weights   07/29/18 1432 08/04/18 0454  Weight: 82.9 kg 86.9 kg    Examination:  General exam: Appears calm and comfortable Respiratory system: Clear to auscultation. Respiratory effort normal. Cardiovascular system: S1 & S2 heard, irregular heart rate with normal rate. No murmurs, rubs, gallops or clicks. Gastrointestinal system: Abdomen is nondistended, soft and mildly tender to the right of umbilicus. Normal bowel sounds heard. Central nervous system: Alert and oriented. No focal neurological deficits. Extremities: No edema. No calf tenderness Skin: No cyanosis. No rashes Psychiatry: Judgement and insight appear normal. Mood & affect appropriate.     Data Reviewed: I have personally reviewed following labs and imaging studies  CBC: Recent Labs  Lab 07/31/18 0304 08/01/18 0329 08/02/18 0343 08/03/18 0324 08/04/18 0448 08/05/18 0500  WBC 13.4* 12.9* 13.7* 16.4* 13.7* 12.9*  NEUTROABS 12.0* 11.0* 11.3* 13.4* 11.0*  --   HGB 14.5 13.9 13.2 14.0 13.8 15.1  HCT 42.5 39.9 37.5* 40.7 39.0 44.5  MCV 93.0 91.7 90.8 91.5 90.3 92.1  PLT 84* 87* 105* 162 191 096   Basic Metabolic Panel: Recent Labs  Lab 08/02/18 0343 08/03/18 0324 08/04/18 0448 08/05/18 0500 08/06/18 0905  NA 133* 136 139 138 140  K 3.5 3.6 3.2* 2.7* 3.8  CL 96* 98 100 97* 98  CO2 27 26 28 27 29   GLUCOSE 195* 110* 107* 92 154*  BUN 19 21 18 14 10   CREATININE 0.86 0.83 0.82 0.81 0.83  CALCIUM 7.6* 7.4* 7.6* 7.7* 8.0*  MG  --   --   --  1.8  --    GFR: Estimated Creatinine Clearance: 78.8 mL/min (by C-G formula based on SCr of 0.83 mg/dL). Liver Function Tests: Recent Labs  Lab 08/02/18 0343 08/03/18 0324 08/04/18 0448 08/05/18 0500 08/06/18 0905  AST 20 22 27 31  33  ALT 20 18 16 18 17   ALKPHOS 109 130* 164* 173* 165*   BILITOT 4.1* 3.0* 2.9* 2.9* 2.3*  PROT 5.3* 5.3* 5.4* 6.0* 5.7*  ALBUMIN 2.3* 2.2* 2.3* 2.4* 2.4*   No results for input(s): LIPASE, AMYLASE in the last 168 hours. Recent Labs  Lab 08/01/18 0852  AMMONIA 23   Coagulation Profile: Recent Labs  Lab 08/01/18 0852  INR 1.62   Cardiac Enzymes: No results for input(s): CKTOTAL, CKMB, CKMBINDEX, TROPONINI in the last 168 hours. BNP (last 3 results) No results for input(s): PROBNP in the last 8760 hours. HbA1C: No results for input(s): HGBA1C in the last 72 hours. CBG: Recent Labs  Lab 08/02/18 1611  GLUCAP 111*   Lipid Profile: No results for input(s): CHOL, HDL, LDLCALC, TRIG, CHOLHDL, LDLDIRECT in the last 72 hours. Thyroid Function Tests: No results for input(s): TSH, T4TOTAL, FREET4, T3FREE, THYROIDAB in the last 72 hours. Anemia Panel: No results for input(s): VITAMINB12, FOLATE, FERRITIN, TIBC, IRON, RETICCTPCT in the last 72 hours. Sepsis Labs: Recent Labs  Lab 07/30/18 1659  LATICACIDVEN 2.3*    Recent Results (from the past 240 hour(s))  Culture, blood (routine x 2)     Status: Abnormal   Collection Time: 07/29/18  9:04 AM  Result Value Ref Range Status   Specimen Description   Final    BLOOD LEFT ANTECUBITAL Performed at [facility] [address] [place], [place] [address]    Special Requests  Final    BOTTLES DRAWN AEROBIC AND ANAEROBIC Blood Culture adequate volume Performed at [facility] [address], [place], [place] [address]    Culture  Setup Time   Final    GRAM NEGATIVE RODS IN BOTH AEROBIC AND ANAEROBIC BOTTLES CRITICAL VALUE NOTED.  VALUE IS CONSISTENT WITH PREVIOUSLY REPORTED AND CALLED VALUE.    Culture (A)  Final    ESCHERICHIA COLI SUSCEPTIBILITIES PERFORMED ON PREVIOUS CULTURE WITHIN THE LAST 5 DAYS. Performed at [facility], [place] [address] [place], [place] [address]    Report Status 08/01/2018 FINAL  Final  Culture, blood  (routine x 2)     Status: Abnormal   Collection Time: 07/29/18  9:04 AM  Result Value Ref Range Status   Specimen Description   Final    BLOOD BLOOD RIGHT FOREARM Performed at [facility] [address], [place], [place] [address]    Special Requests   Final    BOTTLES DRAWN AEROBIC AND ANAEROBIC Blood Culture results may not be optimal due to an excessive volume of blood received in culture bottles Performed at [facility] [address] [place], [place] [address]    Culture  Setup Time   Final    IN BOTH AEROBIC AND ANAEROBIC BOTTLES GRAM NEGATIVE RODS CRITICAL RESULT CALLED TO, READ BACK BY AND VERIFIED WITHLavell Luster PHARMD [id number] 07/30/18 A BROWNING Performed at [facility], [place] [address] [place], [place] [address]    Culture ESCHERICHIA COLI (A)  Final   Report Status 07/31/2018 FINAL  Final   Organism ID, Bacteria ESCHERICHIA COLI  Final      Susceptibility   Escherichia coli - MIC*    AMPICILLIN <=2 SENSITIVE Sensitive     CEFAZOLIN <=4 SENSITIVE Sensitive     CEFEPIME <=1 SENSITIVE Sensitive     CEFTAZIDIME <=1 SENSITIVE Sensitive     CEFTRIAXONE <=1 SENSITIVE Sensitive     CIPROFLOXACIN <=0.25 SENSITIVE Sensitive     GENTAMICIN <=1 SENSITIVE Sensitive     IMIPENEM <=0.25 SENSITIVE Sensitive     TRIMETH/SULFA <=20 SENSITIVE Sensitive     AMPICILLIN/SULBACTAM <=2 SENSITIVE Sensitive     PIP/TAZO <=4 SENSITIVE Sensitive     Extended ESBL NEGATIVE Sensitive     * ESCHERICHIA COLI  Blood Culture ID Panel (Reflexed)     Status: Abnormal   Collection Time: 07/29/18  9:04 AM  Result Value Ref Range Status   Enterococcus species NOT DETECTED NOT DETECTED Final   Listeria monocytogenes NOT DETECTED NOT DETECTED Final   Staphylococcus species NOT DETECTED NOT DETECTED Final   Staphylococcus aureus NOT DETECTED NOT DETECTED Final   Streptococcus species NOT DETECTED NOT DETECTED Final   Streptococcus agalactiae NOT DETECTED  NOT DETECTED Final   Streptococcus pneumoniae NOT DETECTED NOT DETECTED Final   Streptococcus pyogenes NOT DETECTED NOT DETECTED Final   Acinetobacter baumannii NOT DETECTED NOT DETECTED Final   Enterobacteriaceae species DETECTED (A) NOT DETECTED Final    Comment: Enterobacteriaceae represent a large family of gram-negative bacteria, not a single organism. CRITICAL RESULT CALLED TO, READ BACK BY AND VERIFIED WITH: Lavell Luster PHARMD [id number] 07/30/18 A BROWNING    Enterobacter cloacae complex NOT DETECTED NOT DETECTED Final   Escherichia coli DETECTED (A) NOT DETECTED Final    Comment: CRITICAL RESULT CALLED TO, READ BACK BY AND VERIFIED WITH: Lavell Luster PHARMD [id number] 07/30/18 A BROWNING    Klebsiella oxytoca NOT DETECTED NOT DETECTED Final  Klebsiella pneumoniae NOT DETECTED NOT DETECTED Final   Proteus species NOT DETECTED NOT DETECTED Final   Serratia marcescens NOT DETECTED NOT DETECTED Final   Carbapenem resistance NOT DETECTED NOT DETECTED Final   Haemophilus influenzae NOT DETECTED NOT DETECTED Final   Neisseria meningitidis NOT DETECTED NOT DETECTED Final   Pseudomonas aeruginosa NOT DETECTED NOT DETECTED Final   Candida albicans NOT DETECTED NOT DETECTED Final   Candida glabrata NOT DETECTED NOT DETECTED Final   Candida krusei NOT DETECTED NOT DETECTED Final   Candida parapsilosis NOT DETECTED NOT DETECTED Final   Candida tropicalis NOT DETECTED NOT DETECTED Final    Comment: Performed at [facility], [place] [address] [place], [place] [address]  Culture, Urine     Status: None   Collection Time: 07/29/18  1:28 PM  Result Value Ref Range Status   Specimen Description   Final    URINE, RANDOM Performed at [facility] [address] [place], [place] [address]    Special Requests   Final    NONE Performed at [facility], [place] [address] [place], [place] [address]    Culture   Final    NO GROWTH Performed at [facility], [place] [address] [place], [place] [address]    Report Status 08/01/2018 FINAL  Final  MRSA PCR Screening     Status: None   Collection Time: 07/29/18  2:54 PM  Result Value Ref Range Status   MRSA by PCR NEGATIVE NEGATIVE Final    Comment:        The GeneXpert MRSA Assay (FDA approved for NASAL specimens only), is one component of a comprehensive MRSA colonization surveillance program. It is not intended to diagnose MRSA infection nor to guide or monitor treatment for MRSA infections. Performed at [facility], [place] [address] [place], [place] [address]   Culture, body fluid-bottle     Status: None   Collection Time: 08/01/18  3:38 PM  Result Value Ref Range Status   Specimen Description FLUID PERITONEAL  Final   Special Requests BOTTLES DRAWN AEROBIC AND ANAEROBIC  Final   Culture   Final    NO GROWTH 5 DAYS Performed at [facility], [place] [address] [place], [place] [address]    Report Status 08/06/2018 FINAL  Final  Gram stain     Status: None   Collection Time: 08/01/18  3:38 PM  Result Value Ref Range Status   Specimen Description FLUID PERITONEAL  Final   Special Requests NONE  Final   Gram Stain   Final    ABUNDANT WBC PRESENT, PREDOMINANTLY PMN NO ORGANISMS SEEN Performed at [facility], [place] [address], [place],  [address]    Report Status 08/01/2018 FINAL  Final         Radiology Studies: Dg Chest Port 1 View  Result Date: 08/04/2018 CLINICAL DATA:  Shortness of breath EXAM: PORTABLE CHEST 1 VIEW COMPARISON:  08/01/2018 FINDINGS: Cardiomegaly with vascular congestion. Improving aeration in the lung bases. Continued mild bibasilar atelectasis. No visible significant effusions. IMPRESSION: Cardiomegaly with vascular congestion and bibasilar atelectasis. Improving aeration in the bases since prior study. Electronically Signed   By: Rolm Baptise M.D.   On: 08/04/2018 16:01   US Abdomen Limited Ruq  Result Date:  08/04/2018 CLINICAL DATA:  Elevated alk-phos, ascites EXAM: ULTRASOUND ABDOMEN LIMITED RIGHT UPPER QUADRANT COMPARISON:  CT abdomen/pelvis dated 07/31/2018 FINDINGS: Gallbladder: No gallstones. Layering gallbladder sludge.  Mild gallbladder wall thickening. No pericholecystic fluid. Negative sonographic Murphy's sign. Common bile duct: Diameter: 5 mm Liver: Coarse hepatic echotexture. Mildly nodular hepatic contour. No focal hepatic lesion is seen. Portal vein is patent on color Doppler imaging with normal direction of blood flow towards the liver. Additional comments: Mild perihepatic ascites. IMPRESSION: Coarse hepatic echotexture and mildly nodular hepatic contour, raising the possibility of cirrhosis. Mild gallbladder wall thickening with layering gallbladder sludge. This appearance is likely reactive. No associated sonographic findings to suggest acute cholecystitis. Mild perihepatic ascites. Electronically Signed   By: Julian Hy M.D.   On: 08/04/2018 13:45        Scheduled Meds: . allopurinol  200 mg Oral Daily  . amoxicillin-clavulanate  875 mg Oral Q12H  . diltiazem  120 mg Oral Daily  . fluticasone  2 spray Each Nare Daily  . furosemide  20 mg Oral Daily  . loratadine  10 mg Oral Daily  . mouth rinse  15 mL Mouth Rinse BID  . metoprolol tartrate  25 mg Oral BID  . polyethylene glycol  17 g Oral Daily  . sodium chloride flush  3 mL Intravenous Q12H   Continuous Infusions: . sodium chloride Stopped (08/05/18 2032)     LOS: 8 days     Cordelia Poche, MD [facility] 08/06/2018, 10:48 AM Pager: [phone]  If 7PM-7AM, please contact night-coverage [email or web address] 08/06/2018, 10:48 AM

## 2018-08-07 ENCOUNTER — Inpatient Hospital Stay (HOSPITAL_COMMUNITY): Payer: Medicare Other

## 2018-08-07 DIAGNOSIS — I340 Nonrheumatic mitral (valve) insufficiency: Secondary | ICD-10-CM

## 2018-08-07 DIAGNOSIS — R748 Abnormal levels of other serum enzymes: Secondary | ICD-10-CM

## 2018-08-07 DIAGNOSIS — R945 Abnormal results of liver function studies: Secondary | ICD-10-CM

## 2018-08-07 DIAGNOSIS — R7881 Bacteremia: Secondary | ICD-10-CM

## 2018-08-07 DIAGNOSIS — K810 Acute cholecystitis: Secondary | ICD-10-CM

## 2018-08-07 LAB — CBC WITH DIFFERENTIAL/PLATELET
BASOS ABS: 0 10*3/uL (ref 0.0–0.1)
Basophils Relative: 0 %
EOS ABS: 0.1 10*3/uL (ref 0.0–0.7)
EOS PCT: 1 %
HCT: 45.6 % (ref 39.0–52.0)
Hemoglobin: 15.7 g/dL (ref 13.0–17.0)
LYMPHS PCT: 11 %
Lymphs Abs: 1.6 10*3/uL (ref 0.7–4.0)
MCH: 31.8 pg (ref 26.0–34.0)
MCHC: 34.4 g/dL (ref 30.0–36.0)
MCV: 92.3 fL (ref 78.0–100.0)
Monocytes Absolute: 0.8 10*3/uL (ref 0.1–1.0)
Monocytes Relative: 5 %
Neutro Abs: 12.1 10*3/uL — ABNORMAL HIGH (ref 1.7–7.7)
Neutrophils Relative %: 83 %
PLATELETS: 238 10*3/uL (ref 150–400)
RBC: 4.94 MIL/uL (ref 4.22–5.81)
RDW: 15.2 % (ref 11.5–15.5)
WBC: 14.6 10*3/uL — AB (ref 4.0–10.5)

## 2018-08-07 LAB — COMPREHENSIVE METABOLIC PANEL
ALT: 20 U/L (ref 0–44)
ANION GAP: 13 (ref 5–15)
AST: 32 U/L (ref 15–41)
Albumin: 2.6 g/dL — ABNORMAL LOW (ref 3.5–5.0)
Alkaline Phosphatase: 169 U/L — ABNORMAL HIGH (ref 38–126)
BUN: 9 mg/dL (ref 8–23)
CALCIUM: 8 mg/dL — AB (ref 8.9–10.3)
CHLORIDE: 99 mmol/L (ref 98–111)
CO2: 27 mmol/L (ref 22–32)
CREATININE: 0.88 mg/dL (ref 0.61–1.24)
Glucose, Bld: 105 mg/dL — ABNORMAL HIGH (ref 70–99)
Potassium: 3.5 mmol/L (ref 3.5–5.1)
SODIUM: 139 mmol/L (ref 135–145)
Total Bilirubin: 2 mg/dL — ABNORMAL HIGH (ref 0.3–1.2)
Total Protein: 6.2 g/dL — ABNORMAL LOW (ref 6.5–8.1)

## 2018-08-07 LAB — ECHOCARDIOGRAM COMPLETE
HEIGHTINCHES: 69 in
WEIGHTICAEL: 3064 [oz_av]

## 2018-08-07 LAB — MAGNESIUM: MAGNESIUM: 1.8 mg/dL (ref 1.7–2.4)

## 2018-08-07 MED ORDER — MORPHINE SULFATE (PF) 4 MG/ML IV SOLN
3.5000 mg | Freq: Once | INTRAVENOUS | Status: AC
  Administered 2018-08-07: 3.5 mg via INTRAVENOUS

## 2018-08-07 MED ORDER — TECHNETIUM TC 99M MEBROFENIN IV KIT
7.8000 | PACK | Freq: Once | INTRAVENOUS | Status: AC
  Administered 2018-08-07: 7.8 via INTRAVENOUS

## 2018-08-07 MED ORDER — MORPHINE SULFATE (PF) 4 MG/ML IV SOLN
INTRAVENOUS | Status: AC
  Administered 2018-08-07: 3.5 mg via INTRAVENOUS
  Filled 2018-08-07: qty 1

## 2018-08-07 MED ORDER — HEPARIN SODIUM (PORCINE) 5000 UNIT/ML IJ SOLN
5000.0000 [IU] | Freq: Three times a day (TID) | INTRAMUSCULAR | Status: DC
  Administered 2018-08-07: 5000 [IU] via SUBCUTANEOUS
  Filled 2018-08-07: qty 1

## 2018-08-07 MED ORDER — FUROSEMIDE 40 MG PO TABS
40.0000 mg | ORAL_TABLET | Freq: Every day | ORAL | Status: DC
  Administered 2018-08-08 – 2018-08-10 (×3): 40 mg via ORAL
  Filled 2018-08-07 (×3): qty 1

## 2018-08-07 NOTE — Progress Notes (Signed)
PT Cancellation Note  Patient Details Name: Samuel Flores MRN: [id number] DOB: 20-Nov-1940   Cancelled Treatment:    Reason Eval/Treat Not Completed: Medical issues which prohibited therapy - Pt checked on this am and was at procedure/out of room. PT checked vitals via montioring when pt returned to room, pt with tachycardia (137 bpm), current pt problem including afib with RVR. PT to follow up with pt as schedule allows.   Julien Girt, PT Acute Rehabilitation Services Pager [phone]  Office [phone]    [place] 08/07/2018, 3:27 PM

## 2018-08-07 NOTE — Progress Notes (Signed)
Patient ID: Samuel Flores, male   DOB: 19-Sep-1941, 77 y.o.   MRN: [id number]  PROGRESS NOTE    Samuel Flores  ZCH:[id number] DOB: May 12, 1941 DOA: 07/29/2018 PCP: Alycia Rossetti, MD   Brief Narrative:  77 year old male with history of atrial fibrillation, alcohol abuse, gout, history of prostate cancer status post resection presented with weakness and was found to have sepsis secondary to E. coli bacteremia along with atrial fibrillation with rapid ventricular rate.  He was treated with IV antibiotics which has been changed to oral antibiotics.  He was also found to have abdominal distention and ultrasound revealed cirrhosis of liver and ascites for which he underwent paracentesis.  Ultrasound also showed sludging; he also had elevated alkaline phosphatase.  HIDA was ordered.  Cardiology was also consulted for atrial fibrillation with rapid ventricular rate.   Assessment & Plan:   Active Problems:   Hypotension   Sepsis ([place])   Pneumonia due to infectious organism   Alcoholic cirrhosis of liver with ascites (HCC)   Atrial fibrillation with RVR (HCC)   Acute respiratory failure with hypoxia ([facility])   Sepsis secondary to E. coli bacteremia -Hemodynamically stable. -Antibiotic plan as below  E. coli bacteremia -Probably from SBP.  Treated with Unasyn initially, currently on Augmentin.  Complete 2-week course of antibiotic therapy  Probable acute cholecystitis -HIDA scan is positive for probable acute cholecystitis.  Consulted general surgery.  Have also informed cardiology regarding cardiac clearance.  We will keep patient n.p.o. for tomorrow morning.  Leukocytosis -Probably secondary to above -Monitor.  SBP in a patient with recent diagnosis of cirrhosis of liver with ascites -Abdominal pain has improved.  Continue Augmentin as above. -Status post paracentesis on 08/01/2018. -Increase Lasix to 40 mg daily.  Strict input and output.  Daily weights  Persistent A. fib with  RVR -Heart rate is still intermittently elevated.  Cardiology following.  Continue metoprolol, Cardizem and digoxin.  Eliquis has been stopped because of bleeding risks in cirrhosis. -We will get 2D echo  Possible right lower lobe pneumonia -Treated with Unasyn/Zithromax.  Completed course  Acute respiratory failure with hypoxia -Probably secondary to pneumonia.  Resolved.  Currently on room air.  Continue incentive spirometry  Gout -Continue allopurinol   History of prostate cancer -Outpatient follow-up  Acute kidney injury -Baseline creatinine is 1.  Up to 1.65 on admission.  Resolved  Thrombocytopenia -In the setting of infection.  Resolved  Hypokalemia -Improved.  Repeat a.m. labs    DVT prophylaxis: Eliquis has been discontinued.  Will start heparin for prophylaxis Code Status: DNR Family Communication: None at bedside Disposition Plan: Depends on clinical outcome  Consultants:  cardiology/general surgery  Procedures: None  Antimicrobials:  Anti-infectives (From admission, onward)   Start     Dose/Rate Route Frequency Ordered Stop   08/06/18 2200  amoxicillin-clavulanate (AUGMENTIN) 400-57 MG/5ML suspension 875 mg     875 mg Oral Every 12 hours 08/06/18 1009 08/12/18 0959   08/05/18 1600  amoxicillin-clavulanate (AUGMENTIN) 875-125 MG per tablet 1 tablet     1 tablet Oral Every 12 hours 08/05/18 1419 08/06/18 0957   07/31/18 1600  Ampicillin-Sulbactam (UNASYN) 3 g in sodium chloride 0.9 % 100 mL IVPB  Status:  Discontinued     3 g 200 mL/hr over 30 Minutes Intravenous Every 6 hours 07/31/18 1402 08/05/18 1419   07/31/18 1000  vancomycin (VANCOCIN) 1,500 mg in sodium chloride 0.9 % 500 mL IVPB  Status:  Discontinued     1,500 mg 250  mL/hr over 120 Minutes Intravenous Every 48 hours 07/29/18 2021 07/30/18 1227   07/30/18 1400  cefTRIAXone (ROCEPHIN) 2 g in sodium chloride 0.9 % 100 mL IVPB  Status:  Discontinued     2 g 200 mL/hr over 30 Minutes Intravenous  Every 24 hours 07/30/18 1227 07/31/18 1402   07/30/18 1400  azithromycin (ZITHROMAX) 500 mg in sodium chloride 0.9 % 250 mL IVPB     500 mg 250 mL/hr over 60 Minutes Intravenous Every 24 hours 07/30/18 1249 08/03/18 1742   07/29/18 2100  piperacillin-tazobactam (ZOSYN) IVPB 3.375 g  Status:  Discontinued     3.375 g 12.5 mL/hr over 240 Minutes Intravenous Every 8 hours 07/29/18 2018 07/30/18 1227   07/29/18 1000  vancomycin (VANCOCIN) 1,500 mg in sodium chloride 0.9 % 500 mL IVPB     1,500 mg 250 mL/hr over 120 Minutes Intravenous STAT 07/29/18 0945 07/29/18 1242   07/29/18 0945  piperacillin-tazobactam (ZOSYN) IVPB 3.375 g     3.375 g 100 mL/hr over 30 Minutes Intravenous  Once 07/29/18 0941 07/29/18 1018         Subjective: Patient seen and examined at bedside.  He does not feel well.  He feels weak.  He does not have any appetite.  No overnight fever, nausea or vomiting.  No worsening abdominal pain.  Objective: Vitals:   08/06/18 2111 08/06/18 2121 08/07/18 0520 08/07/18 1201  BP: 112/72  (!) 112/57 138/73  Pulse: (!) 57 (!) 138 (!) 109 (!) 119  Resp: 18  18   Temp: 97.7 F (36.5 C)  97.9 F (36.6 C) 98 F (36.7 C)  TempSrc: Oral  Oral Oral  SpO2: 95%  96%   Weight:      Height:        Intake/Output Summary (Last 24 hours) at 08/07/2018 1305 Last data filed at 08/06/2018 1700 Gross per 24 hour  Intake 60 ml  Output 450 ml  Net -390 ml   Filed Weights   07/29/18 1432 08/04/18 0454  Weight: 82.9 kg 86.9 kg    Examination:  General exam: Appears calm and comfortable.  Looks chronically ill Respiratory system: Bilateral decreased breath sounds at bases with some scattered crackles Cardiovascular system: S1 & S2 heard, tachycardic Gastrointestinal system: Abdomen is nondistended, soft and nontender. Normal bowel sounds heard. Extremities: No cyanosis, clubbing, edema  Central nervous system: Alert and oriented. No focal neurological deficits. Moving  extremities Skin: No rashes, lesions or ulcers Psychiatry: Looks anxious    Data Reviewed: I have personally reviewed following labs and imaging studies  CBC: Recent Labs  Lab 08/01/18 0329 08/02/18 0343 08/03/18 0324 08/04/18 0448 08/05/18 0500 08/07/18 0831  WBC 12.9* 13.7* 16.4* 13.7* 12.9* 14.6*  NEUTROABS 11.0* 11.3* 13.4* 11.0*  --  12.1*  HGB 13.9 13.2 14.0 13.8 15.1 15.7  HCT 39.9 37.5* 40.7 39.0 44.5 45.6  MCV 91.7 90.8 91.5 90.3 92.1 92.3  PLT 87* 105* 162 191 195 322   Basic Metabolic Panel: Recent Labs  Lab 08/03/18 0324 08/04/18 0448 08/05/18 0500 08/06/18 0905 08/06/18 1607 08/07/18 0831  NA 136 139 138 140  --  139  K 3.6 3.2* 2.7* 3.8 3.3* 3.5  CL 98 100 97* 98  --  99  CO2 26 28 27 29   --  27  GLUCOSE 110* 107* 92 154*  --  105*  BUN 21 18 14 10   --  9  CREATININE 0.83 0.82 0.81 0.83  --  0.88  CALCIUM 7.4*  7.6* 7.7* 8.0*  --  8.0*  MG  --   --  1.8  --   --  1.8   GFR: Estimated Creatinine Clearance: 74.3 mL/min (by C-G formula based on SCr of 0.88 mg/dL). Liver Function Tests: Recent Labs  Lab 08/03/18 0324 08/04/18 0448 08/05/18 0500 08/06/18 0905 08/07/18 0831  AST 22 27 31  33 32  ALT 18 16 18 17 20   ALKPHOS 130* 164* 173* 165* 169*  BILITOT 3.0* 2.9* 2.9* 2.3* 2.0*  PROT 5.3* 5.4* 6.0* 5.7* 6.2*  ALBUMIN 2.2* 2.3* 2.4* 2.4* 2.6*   No results for input(s): LIPASE, AMYLASE in the last 168 hours. Recent Labs  Lab 08/01/18 0852  AMMONIA 23   Coagulation Profile: Recent Labs  Lab 08/01/18 0852  INR 1.62   Cardiac Enzymes: No results for input(s): CKTOTAL, CKMB, CKMBINDEX, TROPONINI in the last 168 hours. BNP (last 3 results) No results for input(s): PROBNP in the last 8760 hours. HbA1C: No results for input(s): HGBA1C in the last 72 hours. CBG: Recent Labs  Lab 08/02/18 1611  GLUCAP 111*   Lipid Profile: No results for input(s): CHOL, HDL, LDLCALC, TRIG, CHOLHDL, LDLDIRECT in the last 72 hours. Thyroid Function  Tests: No results for input(s): TSH, T4TOTAL, FREET4, T3FREE, THYROIDAB in the last 72 hours. Anemia Panel: No results for input(s): VITAMINB12, FOLATE, FERRITIN, TIBC, IRON, RETICCTPCT in the last 72 hours. Sepsis Labs: No results for input(s): PROCALCITON, LATICACIDVEN in the last 168 hours.  Recent Results (from the past 240 hour(s))  Culture, blood (routine x 2)     Status: Abnormal   Collection Time: 07/29/18  9:04 AM  Result Value Ref Range Status   Specimen Description   Final    BLOOD LEFT ANTECUBITAL Performed at [facility] [address] [place], [place] [address]    Special Requests   Final    BOTTLES DRAWN AEROBIC AND ANAEROBIC Blood Culture adequate volume Performed at [facility] [address], [place], [place] [address]    Culture  Setup Time   Final    GRAM NEGATIVE RODS IN BOTH AEROBIC AND ANAEROBIC BOTTLES CRITICAL VALUE NOTED.  VALUE IS CONSISTENT WITH PREVIOUSLY REPORTED AND CALLED VALUE.    Culture (A)  Final    ESCHERICHIA COLI SUSCEPTIBILITIES PERFORMED ON PREVIOUS CULTURE WITHIN THE LAST 5 DAYS. Performed at [facility], [place] [address], [place], [place] [address]    Report Status 08/01/2018 FINAL  Final  Culture, blood (routine x 2)     Status: Abnormal   Collection Time: 07/29/18  9:04 AM  Result Value Ref Range Status   Specimen Description   Final    BLOOD BLOOD RIGHT FOREARM Performed at [facility] [address], [place], [place] [address]    Special Requests   Final    BOTTLES DRAWN AEROBIC AND ANAEROBIC Blood Culture results may not be optimal due to an excessive volume of blood received in culture bottles Performed at [facility] [address] [place], [place] [address]    Culture  Setup Time   Final    IN BOTH AEROBIC AND ANAEROBIC BOTTLES GRAM NEGATIVE RODS CRITICAL RESULT CALLED TO, READ BACK BY AND VERIFIED WITHLavell Luster PHARMD [id number] 07/30/18  A BROWNING Performed at [facility], [place] [address], [place], [place] [address]    Culture ESCHERICHIA COLI (A)  Final   Report Status 07/31/2018 FINAL  Final   Organism ID, Bacteria  ESCHERICHIA COLI  Final      Susceptibility   Escherichia coli - MIC*    AMPICILLIN <=2 SENSITIVE Sensitive     CEFAZOLIN <=4 SENSITIVE Sensitive     CEFEPIME <=1 SENSITIVE Sensitive     CEFTAZIDIME <=1 SENSITIVE Sensitive     CEFTRIAXONE <=1 SENSITIVE Sensitive     CIPROFLOXACIN <=0.25 SENSITIVE Sensitive     GENTAMICIN <=1 SENSITIVE Sensitive     IMIPENEM <=0.25 SENSITIVE Sensitive     TRIMETH/SULFA <=20 SENSITIVE Sensitive     AMPICILLIN/SULBACTAM <=2 SENSITIVE Sensitive     PIP/TAZO <=4 SENSITIVE Sensitive     Extended ESBL NEGATIVE Sensitive     * ESCHERICHIA COLI  Blood Culture ID Panel (Reflexed)     Status: Abnormal   Collection Time: 07/29/18  9:04 AM  Result Value Ref Range Status   Enterococcus species NOT DETECTED NOT DETECTED Final   Listeria monocytogenes NOT DETECTED NOT DETECTED Final   Staphylococcus species NOT DETECTED NOT DETECTED Final   Staphylococcus aureus NOT DETECTED NOT DETECTED Final   Streptococcus species NOT DETECTED NOT DETECTED Final   Streptococcus agalactiae NOT DETECTED NOT DETECTED Final   Streptococcus pneumoniae NOT DETECTED NOT DETECTED Final   Streptococcus pyogenes NOT DETECTED NOT DETECTED Final   Acinetobacter baumannii NOT DETECTED NOT DETECTED Final   Enterobacteriaceae species DETECTED (A) NOT DETECTED Final    Comment: Enterobacteriaceae represent a large family of gram-negative bacteria, not a single organism. CRITICAL RESULT CALLED TO, READ BACK BY AND VERIFIED WITH: Lavell Luster PHARMD [id number] 07/30/18 A BROWNING    Enterobacter cloacae complex NOT DETECTED NOT DETECTED Final   Escherichia coli DETECTED (A) NOT DETECTED Final    Comment: CRITICAL RESULT CALLED TO, READ BACK BY AND VERIFIED WITH: Lavell Luster PHARMD [id number] 07/30/18 A BROWNING     Klebsiella oxytoca NOT DETECTED NOT DETECTED Final   Klebsiella pneumoniae NOT DETECTED NOT DETECTED Final   Proteus species NOT DETECTED NOT DETECTED Final   Serratia marcescens NOT DETECTED NOT DETECTED Final   Carbapenem resistance NOT DETECTED NOT DETECTED Final   Haemophilus influenzae NOT DETECTED NOT DETECTED Final   Neisseria meningitidis NOT DETECTED NOT DETECTED Final   Pseudomonas aeruginosa NOT DETECTED NOT DETECTED Final   Candida albicans NOT DETECTED NOT DETECTED Final   Candida glabrata NOT DETECTED NOT DETECTED Final   Candida krusei NOT DETECTED NOT DETECTED Final   Candida parapsilosis NOT DETECTED NOT DETECTED Final   Candida tropicalis NOT DETECTED NOT DETECTED Final    Comment: Performed at [facility], [place] [address], [place], [place] [address]  Culture, Urine     Status: None   Collection Time: 07/29/18  1:28 PM  Result Value Ref Range Status   Specimen Description   Final    URINE, RANDOM Performed at [facility] [address], [place], [place] [address]    Special Requests   Final    NONE Performed at [facility], [place] [address] [place], [place] [address]    Culture   Final    NO GROWTH Performed at [facility], [place] [address], [place], [place] [address]    Report Status 08/01/2018 FINAL  Final  MRSA PCR Screening     Status: None   Collection Time: 07/29/18  2:54 PM  Result Value Ref Range Status   MRSA by PCR NEGATIVE NEGATIVE Final    Comment:        The GeneXpert MRSA Assay (FDA approved  for NASAL specimens only), is one component of a comprehensive MRSA colonization surveillance program. It is not intended to diagnose MRSA infection nor to guide or monitor treatment for MRSA infections. Performed at [facility], [place] [address], [place], [place] [address]   Culture, body fluid-bottle     Status: None   Collection Time: 08/01/18  3:38 PM  Result Value Ref Range  Status   Specimen Description FLUID PERITONEAL  Final   Special Requests BOTTLES DRAWN AEROBIC AND ANAEROBIC  Final   Culture   Final    NO GROWTH 5 DAYS Performed at [facility], [place] [address], [place], [place] [address]    Report Status 08/06/2018 FINAL  Final  Gram stain     Status: None   Collection Time: 08/01/18  3:38 PM  Result Value Ref Range Status   Specimen Description FLUID PERITONEAL  Final   Special Requests NONE  Final   Gram Stain   Final    ABUNDANT WBC PRESENT, PREDOMINANTLY PMN NO ORGANISMS SEEN Performed at [facility], [place] [address] [place], [place] [address]    Report Status 08/01/2018 FINAL  Final         Radiology Studies: Nm Hepato W/eject Fract  Result Date: 08/07/2018 CLINICAL DATA:  Upper abdominal pain EXAM: NUCLEAR MEDICINE HEPATOBILIARY IMAGING TECHNIQUE: Initially, sequential axial images were obtained over 60 minutes. After additional radiotracer injection in 3.5 mg morphine sulfate injection, additional images were obtained. RADIOPHARMACEUTICALS:  7.8 mCi Tc-38m Choletec IV. After the first hour, an additional 2.0 millicurie of Technetium 99 M Choletec administered intravenously. COMPARISON:  None. FINDINGS: Liver uptake of radiotracer is unremarkable. There is prompt visualization of small bowel indicating patency of the cystic and common bile ducts. Gallbladder did not visualize even after intravenous morphine administration an additional imaging. IMPRESSION: Nonvisualization of gallbladder despite intravenous morphine augmentation. This finding is indicative of cystic duct obstruction. Cystic duct obstruction is presumptive evidence of acute cholecystitis. Common bile duct is patent as is evidenced by visualization of small bowel. Electronically Signed   By: Lowella Grip III M.D.   On: 08/07/2018 12:26        Scheduled Meds: . allopurinol  200 mg Oral Daily  . amoxicillin-clavulanate  875 mg Oral Q12H  . digoxin  0.125  mg Oral Daily  . diltiazem  120 mg Oral Daily  . fluticasone  2 spray Each Nare Daily  . furosemide  20 mg Oral Daily  . loratadine  10 mg Oral Daily  . mouth rinse  15 mL Mouth Rinse BID  . metoprolol tartrate  25 mg Oral BID  . polyethylene glycol  17 g Oral Daily  . sodium chloride flush  3 mL Intravenous Q12H   Continuous Infusions: . sodium chloride Stopped (08/05/18 2032)     LOS: 9 days        Aline August, MD [facility] Pager [phone]  If 7PM-7AM, please contact night-coverage [email or web address] Password [email or web address] 08/07/2018, 1:05 PM

## 2018-08-07 NOTE — Consult Note (Signed)
[facility] Surgery Consult/Admission Note  Samuel Flores 07/06/1941  [id number].    Requesting MD: Dr. Starla Link Chief Complaint/Reason for Consult: cholecystitis  HPI:   Patient is a 77 yo male with a history of ETOH abuse, a fib, gout, prostate cancer s/p resection who presented to the ED on 09/16 with complaints of abdominal pain, nausea, vomiting, diarrhea, and weakness. Wife at bedside. Patient states he had severe, constant upper abdominal pain that radiated into his chest and right sided back with nausea and vomiting 3 days prior to arrival to ED. Pt was found to have sepsis 2/2 E. Coli bacteremia. Pt also found to have abdominal distention. [place] significant for possible cirrhosis, ascites, gallbladder sludge, mild wall thickening likely 2/2 cirrhosis. HIDA scan done today showed no visualization of gallbladder suggesting cystic duct obstruction. CBD is patent.   Pt states he currently has mild intermittent right sided abdominal pain. No severe. No other symptoms. He has been tolerating a diet.   ROS:  Review of Systems  Constitutional: Negative for chills, diaphoresis and fever.  HENT: Negative for sore throat.   Respiratory: Negative for cough and shortness of breath.   Cardiovascular: Negative for chest pain.  Gastrointestinal: Positive for abdominal pain, diarrhea (resolved), nausea (resolved) and vomiting (resolved). Negative for blood in stool and constipation.  Genitourinary: Negative for dysuria.  Skin: Negative for rash.  Neurological: Negative for dizziness and loss of consciousness.  All other systems reviewed and are negative.    Family History  Problem Relation Age of Onset  . Lung cancer Mother        never smoker  . Prostate cancer Father     Past Medical History:  Diagnosis Date  . Alcohol abuse, daily use 08/18/2013  . Arthritis    osteoarthritis  . CAD (coronary artery disease)    Non obstructive  . Cancer [facility]) 1994   prostate  . Diastolic  dysfunction Echo 2012   Grade 1  . Gout   . 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 Hx-TIA (transient ischemic attack) 2011  . Hypercholesteremia   . Hypertension     Past Surgical History:  Procedure Laterality Date  . BURR HOLE FOR SUBDURAL HEMATOMA  03/2011  . CARDIOVERSION N/A 02/20/2017   Procedure: CARDIOVERSION;  Surgeon: Adrian Prows, MD;  Location: [facility];  Service: Cardiovascular;  Laterality: N/A;  . EYE SURGERY Left 03/2009   cataract  . LEFT HEART CATHETERIZATION WITH CORONARY ANGIOGRAM N/A 01/02/2013   Procedure: LEFT HEART CATHETERIZATION WITH CORONARY ANGIOGRAM;  Surgeon: Laverda Page, MD;  Location: [facility] CATH LAB;  Service: Cardiovascular;  Laterality: N/A;  . PROSTATE SURGERY  1994  . SPINE SURGERY     cervical    Social History:  reports that he quit smoking about 5 years ago. His smoking use included pipe. He quit after 39.00 years of use. He has never used smokeless tobacco. He reports that he drinks about 56.0 standard drinks of alcohol per week. He reports that he does not use drugs.  Allergies: No Known Allergies  Medications Prior to Admission  Medication Sig Dispense Refill  . allopurinol (ZYLOPRIM) 100 MG tablet TAKE 2 TABLETS BY MOUTH EVERY DAY 180 tablet 2  . apixaban (ELIQUIS) 5 MG TABS tablet Take 1 tablet (5 mg total) by mouth 2 (two) times daily. 180 tablet 3  . diltiazem (CARDIZEM CD) 180 MG 24 hr capsule Take 180 mg by mouth daily.  2  . fluticasone (FLONASE) 50 MCG/ACT nasal spray  Place 2 sprays into both nostrils daily. (Patient taking differently: Place 2 sprays into both nostrils daily as needed for allergies. ) 1 g 0  . folic acid (FOLVITE) 1 MG tablet Take 1 tablet (1 mg total) by mouth daily. 30 tablet 0  . metoprolol tartrate (LOPRESSOR) 100 MG tablet Take 100 mg by mouth 2 (two) times daily.    Marland Kitchen spironolactone (ALDACTONE) 50 MG tablet Take 50 mg by mouth daily.    Marland Kitchen torsemide (DEMADEX) 20 MG tablet Take 10 mg by mouth daily.     .  metoprolol tartrate (LOPRESSOR) 50 MG tablet Take 1 tablet (50 mg total) by mouth 2 (two) times daily. (Patient not taking: Reported on 07/29/2018) 60 tablet 3    Blood pressure 121/64, pulse (!) 111, temperature 98 F (36.7 C), temperature source Oral, resp. rate 18, height 5' 9" (1.753 m), weight 86.9 kg, SpO2 96 %.  Physical Exam  Constitutional: He is oriented to person, place, and time. He appears well-developed and well-nourished. No distress.  Appears older than stated age  HENT:  Head: Normocephalic and atraumatic.  Nose: Nose normal.  Mouth/Throat: Uvula is midline, oropharynx is clear and moist and mucous membranes are normal.  Eyes: Pupils are equal, round, and reactive to light. Conjunctivae are normal. Right eye exhibits no discharge. Left eye exhibits no discharge. No scleral icterus.  Neck: Normal range of motion. Neck supple. No thyromegaly present.  Cardiovascular: Normal heart sounds and intact distal pulses. An irregularly irregular rhythm present. Tachycardia present.  No murmur heard. Pulses:      Radial pulses are 2+ on the right side, and 2+ on the left side.       Dorsalis pedis pulses are 2+ on the right side, and 2+ on the left side.  Pulmonary/Chest: Effort normal and breath sounds normal. No respiratory distress. He has no wheezes. He has no rhonchi. He has no rales.  Abdominal: Soft. Normal appearance and bowel sounds are normal. He exhibits no distension. There is no hepatosplenomegaly. There is tenderness in the right upper quadrant. There is no rigidity and no guarding.  Musculoskeletal: Normal range of motion. He exhibits no edema, tenderness or deformity.  Lymphadenopathy:    He has no cervical adenopathy.  Neurological: He is alert and oriented to person, place, and time.  Skin: Skin is warm and dry. No rash noted. He is not diaphoretic.  Psychiatric: He has a normal mood and affect.  Nursing note and vitals reviewed.   Results for orders placed or  performed during the hospital encounter of 07/29/18 (from the past 48 hour(s))  Comprehensive metabolic panel     Status: Abnormal   Collection Time: 08/06/18  9:05 AM  Result Value Ref Range   Sodium 140 135 - 145 mmol/L   Potassium 3.8 3.5 - 5.1 mmol/L    Comment: DELTA CHECK NOTED CAPILLARY SPECIMEN    Chloride 98 98 - 111 mmol/L   CO2 29 22 - 32 mmol/L   Glucose, Bld 154 (H) 70 - 99 mg/dL   BUN 10 8 - 23 mg/dL   Creatinine, Ser 0.83 0.61 - 1.24 mg/dL   Calcium 8.0 (L) 8.9 - 10.3 mg/dL   Total Protein 5.7 (L) 6.5 - 8.1 g/dL   Albumin 2.4 (L) 3.5 - 5.0 g/dL   AST 33 15 - 41 U/L   ALT 17 0 - 44 U/L   Alkaline Phosphatase 165 (H) 38 - 126 U/L   Total Bilirubin 2.3 (H) 0.3 -  1.2 mg/dL   GFR calc non Af Amer >60 >60 mL/min   GFR calc Af Amer >60 >60 mL/min    Comment: (NOTE) The eGFR has been calculated using the CKD EPI equation. This calculation has not been validated in all clinical situations. eGFR's persistently <60 mL/min signify possible Chronic Kidney Disease.    Anion gap 13 5 - 15    Comment: Performed at [facility], [place] [address] [place], [place] [address]  Potassium     Status: Abnormal   Collection Time: 08/06/18  4:07 PM  Result Value Ref Range   Potassium 3.3 (L) 3.5 - 5.1 mmol/L    Comment: Performed at [facility], [place] [address], [place], [place] [address]  CBC with Differential/Platelet     Status: Abnormal   Collection Time: 08/07/18  8:31 AM  Result Value Ref Range   WBC 14.6 (H) 4.0 - 10.5 K/uL   RBC 4.94 4.22 - 5.81 MIL/uL   Hemoglobin 15.7 13.0 - 17.0 g/dL   HCT 45.6 39.0 - 52.0 %   MCV 92.3 78.0 - 100.0 fL   MCH 31.8 26.0 - 34.0 pg   MCHC 34.4 30.0 - 36.0 g/dL   RDW 15.2 11.5 - 15.5 %   Platelets 238 150 - 400 K/uL   Neutrophils Relative % 83 %   Neutro Abs 12.1 (H) 1.7 - 7.7 K/uL   Lymphocytes Relative 11 %   Lymphs Abs 1.6 0.7 - 4.0 K/uL   Monocytes Relative 5 %   Monocytes Absolute 0.8 0.1 - 1.0  K/uL   Eosinophils Relative 1 %   Eosinophils Absolute 0.1 0.0 - 0.7 K/uL   Basophils Relative 0 %   Basophils Absolute 0.0 0.0 - 0.1 K/uL    Comment: Performed at [facility], [place] [address] [place], [place] [address]  Comprehensive metabolic panel     Status: Abnormal   Collection Time: 08/07/18  8:31 AM  Result Value Ref Range   Sodium 139 135 - 145 mmol/L   Potassium 3.5 3.5 - 5.1 mmol/L   Chloride 99 98 - 111 mmol/L   CO2 27 22 - 32 mmol/L   Glucose, Bld 105 (H) 70 - 99 mg/dL   BUN 9 8 - 23 mg/dL   Creatinine, Ser 0.88 0.61 - 1.24 mg/dL   Calcium 8.0 (L) 8.9 - 10.3 mg/dL   Total Protein 6.2 (L) 6.5 - 8.1 g/dL   Albumin 2.6 (L) 3.5 - 5.0 g/dL   AST 32 15 - 41 U/L   ALT 20 0 - 44 U/L   Alkaline Phosphatase 169 (H) 38 - 126 U/L   Total Bilirubin 2.0 (H) 0.3 - 1.2 mg/dL   GFR calc non Af Amer >60 >60 mL/min   GFR calc Af Amer >60 >60 mL/min    Comment: (NOTE) The eGFR has been calculated using the CKD EPI equation. This calculation has not been validated in all clinical situations. eGFR's persistently <60 mL/min signify possible Chronic Kidney Disease.    Anion gap 13 5 - 15    Comment: Performed at [facility], [place] [address], [place], [place] [address]  Magnesium     Status: None   Collection Time: 08/07/18  8:31 AM  Result Value Ref Range   Magnesium 1.8 1.7 - 2.4 mg/dL    Comment: Performed at [facility], [place] [address], [place], [place] [address]   Nm Hepato W/eject Fract  Result Date: 08/07/2018  CLINICAL DATA:  Upper abdominal pain EXAM: NUCLEAR MEDICINE HEPATOBILIARY IMAGING TECHNIQUE: Initially, sequential axial images were obtained over 60 minutes. After additional radiotracer injection in 3.5 mg morphine sulfate injection, additional images were obtained. RADIOPHARMACEUTICALS:  7.8 mCi Tc-67mCholetec IV. After the first hour, an additional 2.0 millicurie of Technetium 99 M Choletec administered  intravenously. COMPARISON:  None. FINDINGS: Liver uptake of radiotracer is unremarkable. There is prompt visualization of small bowel indicating patency of the cystic and common bile ducts. Gallbladder did not visualize even after intravenous morphine administration an additional imaging. IMPRESSION: Nonvisualization of gallbladder despite intravenous morphine augmentation. This finding is indicative of cystic duct obstruction. Cystic duct obstruction is presumptive evidence of acute cholecystitis. Common bile duct is patent as is evidenced by visualization of small bowel. Electronically Signed   By: WLowella GripIII M.D.   On: 08/07/2018 12:26   Anti-infectives (From admission, onward)   Start     Dose/Rate Route Frequency Ordered Stop   08/06/18 2200  amoxicillin-clavulanate (AUGMENTIN) 400-57 MG/5ML suspension 875 mg     875 mg Oral Every 12 hours 08/06/18 1009 08/12/18 0959   08/05/18 1600  amoxicillin-clavulanate (AUGMENTIN) 875-125 MG per tablet 1 tablet     1 tablet Oral Every 12 hours 08/05/18 1419 08/06/18 0957   07/31/18 1600  Ampicillin-Sulbactam (UNASYN) 3 g in sodium chloride 0.9 % 100 mL IVPB  Status:  Discontinued     3 g 200 mL/hr over 30 Minutes Intravenous Every 6 hours 07/31/18 1402 08/05/18 1419   07/31/18 1000  vancomycin (VANCOCIN) 1,500 mg in sodium chloride 0.9 % 500 mL IVPB  Status:  Discontinued     1,500 mg 250 mL/hr over 120 Minutes Intravenous Every 48 hours 07/29/18 2021 07/30/18 1227   07/30/18 1400  cefTRIAXone (ROCEPHIN) 2 g in sodium chloride 0.9 % 100 mL IVPB  Status:  Discontinued     2 g 200 mL/hr over 30 Minutes Intravenous Every 24 hours 07/30/18 1227 07/31/18 1402   07/30/18 1400  azithromycin (ZITHROMAX) 500 mg in sodium chloride 0.9 % 250 mL IVPB     500 mg 250 mL/hr over 60 Minutes Intravenous Every 24 hours 07/30/18 1249 08/03/18 1742   07/29/18 2100  piperacillin-tazobactam (ZOSYN) IVPB 3.375 g  Status:  Discontinued     3.375 g 12.5 mL/hr over  240 Minutes Intravenous Every 8 hours 07/29/18 2018 07/30/18 1227   07/29/18 1000  vancomycin (VANCOCIN) 1,500 mg in sodium chloride 0.9 % 500 mL IVPB     1,500 mg 250 mL/hr over 120 Minutes Intravenous STAT 07/29/18 0945 07/29/18 1242   07/29/18 0945  piperacillin-tazobactam (ZOSYN) IVPB 3.375 g     3.375 g 100 mL/hr over 30 Minutes Intravenous  Once 07/29/18 0941 07/29/18 1018      Assessment/Plan Active Problems:   Hypotension   Sepsis ([place]   Pneumonia due to infectious organism   Alcoholic cirrhosis of liver with ascites (HCC)   Atrial fibrillation with RVR ([place]   Acute respiratory failure with hypoxia (HCC)  Cholecystitis  - HIDA did not show gallbladder suggesting cystic duct obstruction - alk phos 169, Tbili 2.0, WBC 14.6 - cards eval pending for surgical readiness, ECHO pending  FEN: heart healthy, NPO at midnight VTE: SCD's, heparin ID: no abx currenlty, see above for abx's this admission  Foley: none Follow up: TBD  Plan: 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 OR vs perc chole drain pending cards eval. We will follow for surgical readiness.  Kalman Drape, [facility] Surgery 08/07/2018, 2:40 PM Pager: [phone] Consults: [phone] Mon-Fri 7:00 am-4:30 pm Sat-Sun 7:00 am-11:30 am

## 2018-08-07 NOTE — Progress Notes (Signed)
  Echocardiogram 2D Echocardiogram has been performed.  Samuel Flores 08/07/2018, 4:06 PM

## 2018-08-08 ENCOUNTER — Encounter (HOSPITAL_COMMUNITY)
Admission: EM | Disposition: A | Discharge status: Home / Self Care | Payer: Self-pay | Source: Home / Self Care | Attending: Family Medicine

## 2018-08-08 ENCOUNTER — Encounter (HOSPITAL_COMMUNITY): Payer: Self-pay | Admitting: *Deleted

## 2018-08-08 ENCOUNTER — Inpatient Hospital Stay (HOSPITAL_COMMUNITY): Payer: Medicare Other | Admitting: Certified Registered Nurse Anesthetist

## 2018-08-08 DIAGNOSIS — A419 Sepsis, unspecified organism: Secondary | ICD-10-CM

## 2018-08-08 HISTORY — PX: CHOLECYSTECTOMY: SHX55

## 2018-08-08 LAB — COMPREHENSIVE METABOLIC PANEL
ALBUMIN: 2.3 g/dL — AB (ref 3.5–5.0)
ALT: 17 U/L (ref 0–44)
AST: 32 U/L (ref 15–41)
Alkaline Phosphatase: 128 U/L — ABNORMAL HIGH (ref 38–126)
Anion gap: 9 (ref 5–15)
BUN: 10 mg/dL (ref 8–23)
CHLORIDE: 99 mmol/L (ref 98–111)
CO2: 27 mmol/L (ref 22–32)
CREATININE: 0.84 mg/dL (ref 0.61–1.24)
Calcium: 7.8 mg/dL — ABNORMAL LOW (ref 8.9–10.3)
GFR calc Af Amer: >60 mL/min (ref >60)
GLUCOSE: 103 mg/dL — AB (ref 70–99)
Potassium: 3.9 mmol/L (ref 3.5–5.1)
Sodium: 135 mmol/L (ref 135–145)
Total Bilirubin: 2.3 mg/dL — ABNORMAL HIGH (ref 0.3–1.2)
Total Protein: 5.1 g/dL — ABNORMAL LOW (ref 6.5–8.1)

## 2018-08-08 LAB — CBC WITH DIFFERENTIAL/PLATELET
BASOS ABS: 0 10*3/uL (ref 0.0–0.1)
Basophils Relative: 0 %
EOS ABS: 0.2 10*3/uL (ref 0.0–0.7)
Eosinophils Relative: 2 %
HCT: 40.1 % (ref 39.0–52.0)
HEMOGLOBIN: 13.5 g/dL (ref 13.0–17.0)
LYMPHS ABS: 1.3 10*3/uL (ref 0.7–4.0)
Lymphocytes Relative: 10 %
MCH: 31.1 pg (ref 26.0–34.0)
MCHC: 33.7 g/dL (ref 30.0–36.0)
MCV: 92.4 fL (ref 78.0–100.0)
Monocytes Absolute: 1 10*3/uL (ref 0.1–1.0)
Monocytes Relative: 8 %
NEUTROS PCT: 80 %
Neutro Abs: 9.8 10*3/uL — ABNORMAL HIGH (ref 1.7–7.7)
Platelets: 257 10*3/uL (ref 150–400)
RBC: 4.34 MIL/uL (ref 4.22–5.81)
RDW: 15.1 % (ref 11.5–15.5)
WBC: 12.3 10*3/uL — AB (ref 4.0–10.5)

## 2018-08-08 LAB — PROTIME-INR
INR: 1.01
Prothrombin Time: 13.2 seconds (ref 11.4–15.2)

## 2018-08-08 LAB — MAGNESIUM: MAGNESIUM: 1.7 mg/dL (ref 1.7–2.4)

## 2018-08-08 LAB — SURGICAL PCR SCREEN
MRSA, PCR: NEGATIVE
STAPHYLOCOCCUS AUREUS: NEGATIVE

## 2018-08-08 SURGERY — LAPAROSCOPIC CHOLECYSTECTOMY WITH INTRAOPERATIVE CHOLANGIOGRAM
Anesthesia: General | Site: Abdomen

## 2018-08-08 MED ORDER — SUGAMMADEX SODIUM 200 MG/2ML IV SOLN
INTRAVENOUS | Status: DC | PRN
  Administered 2018-08-08: 200 mg via INTRAVENOUS

## 2018-08-08 MED ORDER — HEMOSTATIC AGENTS (NO CHARGE) OPTIME: TOPICAL | Status: DC | PRN

## 2018-08-08 MED ORDER — ALBUMIN HUMAN 5 % IV SOLN
INTRAVENOUS | Status: AC
  Filled 2018-08-08: qty 250

## 2018-08-08 MED ORDER — TRAMADOL HCL 50 MG PO TABS: 100.0000 mg | ORAL_TABLET | Freq: Four times a day (QID) | ORAL | Status: DC | PRN

## 2018-08-08 MED ORDER — IOPAMIDOL (ISOVUE-300) INJECTION 61%
INTRAVENOUS | Status: AC
  Filled 2018-08-08: qty 50

## 2018-08-08 MED ORDER — FENTANYL CITRATE (PF) 100 MCG/2ML IJ SOLN: 25.0000 ug | INTRAMUSCULAR | Status: DC | PRN

## 2018-08-08 MED ORDER — PHENYLEPHRINE 40 MCG/ML (10ML) SYRINGE FOR IV PUSH (FOR BLOOD PRESSURE SUPPORT)
PREFILLED_SYRINGE | INTRAVENOUS | Status: DC | PRN
  Administered 2018-08-08 (×2): 120 ug via INTRAVENOUS
  Administered 2018-08-08: 80 ug via INTRAVENOUS

## 2018-08-08 MED ORDER — ONDANSETRON HCL 4 MG/2ML IJ SOLN: 4.0000 mg | Freq: Once | INTRAMUSCULAR | Status: DC | PRN

## 2018-08-08 MED ORDER — 0.9 % SODIUM CHLORIDE (POUR BTL) OPTIME
TOPICAL | Status: DC | PRN
  Administered 2018-08-08: 1000 mL

## 2018-08-08 MED ORDER — PIPERACILLIN-TAZOBACTAM 3.375 G IVPB
3.3750 g | Freq: Three times a day (TID) | INTRAVENOUS | Status: DC
  Administered 2018-08-08 – 2018-08-09 (×3): 3.375 g via INTRAVENOUS
  Filled 2018-08-08 (×3): qty 50

## 2018-08-08 MED ORDER — SODIUM CHLORIDE 0.9 % IV SOLN
INTRAVENOUS | Status: DC | PRN
  Administered 2018-08-08: 50 ug/min via INTRAVENOUS

## 2018-08-08 MED ORDER — ONDANSETRON HCL 4 MG/2ML IJ SOLN
INTRAMUSCULAR | Status: DC | PRN
  Administered 2018-08-08: 4 mg via INTRAVENOUS

## 2018-08-08 MED ORDER — ALBUMIN HUMAN 5 % IV SOLN
INTRAVENOUS | Status: DC | PRN
  Administered 2018-08-08: 16:00:00 via INTRAVENOUS

## 2018-08-08 MED ORDER — LACTATED RINGERS IR SOLN
Status: DC | PRN
  Administered 2018-08-08: 1000 mL

## 2018-08-08 MED ORDER — LIDOCAINE HCL (PF) 1 % IJ SOLN
INTRAMUSCULAR | Status: DC | PRN
  Administered 2018-08-08: 5 mL

## 2018-08-08 MED ORDER — LIDOCAINE 2% (20 MG/ML) 5 ML SYRINGE
INTRAMUSCULAR | Status: DC | PRN
  Administered 2018-08-08: 80 mg via INTRAVENOUS

## 2018-08-08 MED ORDER — SUGAMMADEX SODIUM 200 MG/2ML IV SOLN
INTRAVENOUS | Status: AC
  Filled 2018-08-08: qty 2

## 2018-08-08 MED ORDER — KETOROLAC TROMETHAMINE 30 MG/ML IJ SOLN
15.0000 mg | Freq: Three times a day (TID) | INTRAMUSCULAR | Status: AC
  Filled 2018-08-08: qty 1

## 2018-08-08 MED ORDER — BUPIVACAINE-EPINEPHRINE (PF) 0.25% -1:200000 IJ SOLN
INTRAMUSCULAR | Status: DC | PRN
  Administered 2018-08-08: 5 mL via PERINEURAL

## 2018-08-08 MED ORDER — SODIUM CHLORIDE 0.9 % IV SOLN: INTRAVENOUS | Status: DC | PRN

## 2018-08-08 MED ORDER — BUPIVACAINE-EPINEPHRINE (PF) 0.25% -1:200000 IJ SOLN
INTRAMUSCULAR | Status: AC
  Filled 2018-08-08: qty 30

## 2018-08-08 MED ORDER — ROCURONIUM BROMIDE 50 MG/5ML IV SOSY
PREFILLED_SYRINGE | INTRAVENOUS | Status: DC | PRN
  Administered 2018-08-08: 40 mg via INTRAVENOUS
  Administered 2018-08-08: 10 mg via INTRAVENOUS

## 2018-08-08 MED ORDER — PHENYLEPHRINE HCL 10 MG/ML IJ SOLN
INTRAMUSCULAR | Status: AC
  Filled 2018-08-08: qty 2

## 2018-08-08 MED ORDER — ALLOPURINOL 100 MG PO TABS
200.0000 mg | ORAL_TABLET | Freq: Every day | ORAL | Status: DC
  Administered 2018-08-09 – 2018-08-10 (×2): 200 mg via ORAL
  Filled 2018-08-08 (×2): qty 2

## 2018-08-08 MED ORDER — PHENYLEPHRINE 40 MCG/ML (10ML) SYRINGE FOR IV PUSH (FOR BLOOD PRESSURE SUPPORT)
PREFILLED_SYRINGE | INTRAVENOUS | Status: AC
  Filled 2018-08-08: qty 10

## 2018-08-08 MED ORDER — PROPOFOL 10 MG/ML IV BOLUS
INTRAVENOUS | Status: AC
  Filled 2018-08-08: qty 20

## 2018-08-08 MED ORDER — LACTATED RINGERS IV SOLN
INTRAVENOUS | Status: DC
  Administered 2018-08-08: 15:00:00 via INTRAVENOUS

## 2018-08-08 MED ORDER — FENTANYL CITRATE (PF) 100 MCG/2ML IJ SOLN
INTRAMUSCULAR | Status: AC
  Filled 2018-08-08: qty 2

## 2018-08-08 MED ORDER — SUCCINYLCHOLINE CHLORIDE 200 MG/10ML IV SOSY
PREFILLED_SYRINGE | INTRAVENOUS | Status: DC | PRN
  Administered 2018-08-08: 120 mg via INTRAVENOUS

## 2018-08-08 MED ORDER — PROPOFOL 10 MG/ML IV BOLUS
INTRAVENOUS | Status: DC | PRN
  Administered 2018-08-08: 140 mg via INTRAVENOUS

## 2018-08-08 MED ORDER — ACETAMINOPHEN 500 MG PO TABS
1000.0000 mg | ORAL_TABLET | Freq: Three times a day (TID) | ORAL | Status: DC
  Filled 2018-08-08 (×2): qty 2

## 2018-08-08 MED ORDER — MORPHINE SULFATE (PF) 2 MG/ML IV SOLN: 2.0000 mg | INTRAVENOUS | Status: DC | PRN

## 2018-08-08 MED ORDER — FENTANYL CITRATE (PF) 100 MCG/2ML IJ SOLN
INTRAMUSCULAR | Status: DC | PRN
  Administered 2018-08-08 (×2): 50 ug via INTRAVENOUS

## 2018-08-08 MED ORDER — LIDOCAINE HCL (PF) 1 % IJ SOLN
INTRAMUSCULAR | Status: AC
  Filled 2018-08-08: qty 30

## 2018-08-08 MED ORDER — HEPARIN SODIUM (PORCINE) 5000 UNIT/ML IJ SOLN
5000.0000 [IU] | Freq: Three times a day (TID) | INTRAMUSCULAR | Status: DC
  Administered 2018-08-09 – 2018-08-10 (×3): 5000 [IU] via SUBCUTANEOUS
  Filled 2018-08-08 (×3): qty 1

## 2018-08-08 SURGICAL SUPPLY — 41 items
ADH SKN CLS APL DERMABOND .7 (GAUZE/BANDAGES/DRESSINGS) ×1
APPLIER CLIP ROT 10 11.4 M/L (STAPLE) ×3
APR CLP MED LRG 11.4X10 (STAPLE) ×1
BAG SPEC RTRVL LRG 6X4 10 (ENDOMECHANICALS) ×1
CABLE HIGH FREQUENCY MONO STRZ (ELECTRODE) ×3 IMPLANT
CHLORAPREP W/TINT 26ML (MISCELLANEOUS) ×3 IMPLANT
CLIP APPLIE ROT 10 11.4 M/L (STAPLE) ×1 IMPLANT
CLIP VESOLOCK MED LG 6/CT (CLIP) IMPLANT
COVER MAYO STAND STRL (DRAPES) IMPLANT
COVER SURGICAL LIGHT HANDLE (MISCELLANEOUS) ×3 IMPLANT
DECANTER SPIKE VIAL GLASS SM (MISCELLANEOUS) ×3 IMPLANT
DERMABOND ADVANCED (GAUZE/BANDAGES/DRESSINGS) ×2
DERMABOND ADVANCED .7 DNX12 (GAUZE/BANDAGES/DRESSINGS) IMPLANT
DRAPE C-ARM 42X120 X-RAY (DRAPES) ×2 IMPLANT
ELECT L-HOOK LAP 45CM DISP (ELECTROSURGICAL)
ELECT PENCIL ROCKER SW 15FT (MISCELLANEOUS) ×3 IMPLANT
ELECT REM PT RETURN 15FT ADLT (MISCELLANEOUS) ×3 IMPLANT
ELECTRODE L-HOOK LAP 45CM DISP (ELECTROSURGICAL) IMPLANT
GLOVE BIO SURGEON STRL SZ 6 (GLOVE) ×5 IMPLANT
GLOVE INDICATOR 6.5 STRL GRN (GLOVE) ×5 IMPLANT
GOWN STRL REUS W/TWL 2XL LVL3 (GOWN DISPOSABLE) ×3 IMPLANT
GOWN STRL REUS W/TWL XL LVL3 (GOWN DISPOSABLE) ×10 IMPLANT
HEMOSTAT SNOW SURGICEL 2X4 (HEMOSTASIS) ×2 IMPLANT
KIT BASIN OR (CUSTOM PROCEDURE TRAY) ×3 IMPLANT
L-HOOK LAP DISP 36CM (ELECTROSURGICAL) ×3
LHOOK LAP DISP 36CM (ELECTROSURGICAL) IMPLANT
POSITIONER SURGICAL ARM (MISCELLANEOUS) IMPLANT
POUCH SPECIMEN RETRIEVAL 10MM (ENDOMECHANICALS) ×3 IMPLANT
SCISSORS LAP 5X35 DISP (ENDOMECHANICALS) ×3 IMPLANT
SET CHOLANGIOGRAPH MIX (MISCELLANEOUS) ×2 IMPLANT
SET IRRIG TUBING LAPAROSCOPIC (IRRIGATION / IRRIGATOR) ×3 IMPLANT
SLEEVE XCEL OPT CAN 5 100 (ENDOMECHANICALS) ×3 IMPLANT
SUT MNCRL AB 4-0 PS2 18 (SUTURE) ×3 IMPLANT
TAPE CLOTH 4X10 WHT NS (GAUZE/BANDAGES/DRESSINGS) IMPLANT
TOWEL OR 17X26 10 PK STRL BLUE (TOWEL DISPOSABLE) ×3 IMPLANT
TOWEL OR NON WOVEN STRL DISP B (DISPOSABLE) ×3 IMPLANT
TRAY LAPAROSCOPIC (CUSTOM PROCEDURE TRAY) ×3 IMPLANT
TROCAR BLADELESS OPT 5 100 (ENDOMECHANICALS) ×3 IMPLANT
TROCAR XCEL BLUNT TIP 100MML (ENDOMECHANICALS) ×3 IMPLANT
TROCAR XCEL NON-BLD 11X100MML (ENDOMECHANICALS) ×3 IMPLANT
TUBING INSUF HEATED (TUBING) ×2 IMPLANT

## 2018-08-08 NOTE — Discharge Instructions (Signed)
[facility] ______[facility] °LAPAROSCOPIC SURGERY: POST OP INSTRUCTIONS °Always review your discharge instruction sheet given to you by the facility where your surgery was performed. °IF YOU HAVE DISABILITY OR FAMILY LEAVE FORMS, YOU MUST BRING THEM TO THE OFFICE FOR PROCESSING.   °DO NOT GIVE THEM TO YOUR DOCTOR. ° °1. A prescription for pain medication may be given to you upon discharge.  Take your pain medication as prescribed, if needed.  If narcotic pain medicine is not needed, then you may take acetaminophen (Tylenol) or ibuprofen (Advil) as needed. °2. Take your usually prescribed medications unless otherwise directed. °3. If you need a refill on your pain medication, please contact your pharmacy.  They will contact our office to request authorization. Prescriptions will not be filled after 5pm or on week-ends. °4. You should follow a light diet the first few days after arrival home, such as soup and crackers, etc.  Be sure to include lots of fluids daily. °5. Most patients will experience some swelling and bruising in the area of the incisions.  Ice packs will help.  Swelling and bruising can take several days to resolve.  °6. It is common to experience some constipation if taking pain medication after surgery.  Increasing fluid intake and taking a stool softener (such as Colace) will usually help or prevent this problem from occurring.  A mild laxative (Milk of Magnesia or Miralax) should be taken according to package instructions if there are no bowel movements after 48 hours. °7. Unless discharge instructions indicate otherwise, you may remove your bandages 24-48 hours after surgery, and you may shower at that time.  You may have steri-strips (small skin tapes) in place directly over the incision.  These strips should be left on the skin for 7-10 days.  If your surgeon used skin glue on the incision, you may shower in 24 hours.  The glue will flake off over the next 2-3 weeks.  Any sutures or  staples will be removed at the office during your follow-up visit. °8. ACTIVITIES:  You may resume regular (light) daily activities beginning the next day--such as daily self-care, walking, climbing stairs--gradually increasing activities as tolerated.  You may have sexual intercourse when it is comfortable.  Refrain from any heavy lifting or straining until approved by your doctor. °a. You may drive when you are no longer taking prescription pain medication, you can comfortably wear a seatbelt, and you can safely maneuver your car and apply brakes. °b. RETURN TO WORK:  __________________________________________________________ °9. You should see your doctor in the office for a follow-up appointment approximately 2-3 weeks after your surgery.  Make sure that you call for this appointment within a day or two after you arrive home to insure a convenient appointment time. °10. OTHER INSTRUCTIONS: __________________________________________________________________________________________________________________________ __________________________________________________________________________________________________________________________ °WHEN TO CALL YOUR DOCTOR: °1. Fever over 101.0 °2. Inability to urinate °3. Continued bleeding from incision. °4. Increased pain, redness, or drainage from the incision. °5. Increasing abdominal pain ° °The clinic staff is available to answer your questions during regular business hours.  Please don’t hesitate to call and ask to speak to one of the nurses for clinical concerns.  If you have a medical emergency, go to the nearest emergency room or call 911.  A surgeon from [facility] is always on call at the hospital. °[address], [address], [place], [place]  [address] ? [address] [address], [place],    [address] °[phone] ? [phone] ? FAX [phone] °Web site:   [email or web address]    Laparoscopic Cholecystectomy, Care After This sheet  gives you information about how to care for yourself after your procedure. Your health care provider may also give you more specific instructions. If you have problems or questions, contact your health care provider. What can I expect after the procedure? After the procedure, it is common to have:  Pain at your incision sites. You will be given medicines to control this pain.  Mild nausea or vomiting.  Bloating and possible shoulder pain from the air-like gas that was used during the procedure.  Follow these instructions at home: Incision care   Follow instructions from your health care provider about how to take care of your incisions. Make sure you: ? Wash your hands with soap and water before you change your bandage (dressing). If soap and water are not available, use hand sanitizer. ? Change your dressing as told by your health care provider. ? Leave stitches (sutures), skin glue, or adhesive strips in place. These skin closures may need to be in place for 2 weeks or longer. If adhesive strip edges start to loosen and curl up, you may trim the loose edges. Do not remove adhesive strips completely unless your health care provider tells you to do that.  Do not take baths, swim, or use a hot tub until your health care provider approves. Ask your health care provider if you can take showers. You may only be allowed to take sponge baths for bathing.  Check your incision area every day for signs of infection. Check for: ? More redness, swelling, or pain. ? More fluid or blood. ? Warmth. ? Pus or a bad smell. Activity  Do not drive or use heavy machinery while taking prescription pain medicine.  Do not lift anything that is heavier than 10 lb (4.5 kg) until your health care provider approves.  Do not play contact sports until your health care provider approves.  Do not drive for 24 hours if you were given a medicine to help you relax (sedative).  Rest as needed. Do not return to work  or school until your health care provider approves. General instructions  Take over-the-counter and prescription medicines only as told by your health care provider.  To prevent or treat constipation while you are taking prescription pain medicine, your health care provider may recommend that you: ? Drink enough fluid to keep your urine clear or pale yellow. ? Take over-the-counter or prescription medicines. ? Eat foods that are high in fiber, such as fresh fruits and vegetables, whole grains, and beans. ? Limit foods that are high in fat and processed sugars, such as fried and sweet foods. Contact a health care provider if:  You develop a rash.  You have more redness, swelling, or pain around your incisions.  You have more fluid or blood coming from your incisions.  Your incisions feel warm to the touch.  You have pus or a bad smell coming from your incisions.  You have a fever.  One or more of your incisions breaks open. Get help right away if:  You have trouble breathing.  You have chest pain.  You have increasing pain in your shoulders.  You faint or feel dizzy when you stand.  You have severe pain in your abdomen.  You have nausea or vomiting that lasts for more than one day.  You have leg pain. This information is not intended to replace advice given to you by your health care provider. Make sure  discuss any questions you have with your health care provider. °Document Released: 10/30/2005 Document Revised: 05/20/2016 Document Reviewed: 04/17/2016 °Elsevier Interactive Patient Education © 2018 Elsevier Inc. ° °

## 2018-08-08 NOTE — Progress Notes (Signed)
CC:  Abdominal pain  Subjective: He feels better, but still has some discomfort RUQ.  He is ready to go forward with surgery.  Objective: Vital signs in last 24 hours: Temp:  [97.7 F (36.5 C)-98.9 F (37.2 C)] 97.7 F (36.5 C) (09/26 0621) Pulse Rate:  [79-119] 105 (09/26 0621) Resp:  [18-20] 20 (09/26 0621) BP: (113-138)/(56-81) 114/56 (09/26 0621) SpO2:  [94 %-96 %] 95 % (09/26 5056) Weight:  [83.2 kg] 83.2 kg (09/26 0500) Last BM Date: 08/06/18 Nothing PO recorded - NPO IV not recorded Voided x 5 recorded Afebrile, Tachycardic BP stable, Sats good on room air; Telem shows AF/PVC's LFT's stable; K+ 3.9/Mag1.7 WBC 12.3 INR 1.01 RUQ [place] 9/22:  Possible cirrhosis/mild GB thickening, sludge, no pericholecystic fluid, CBD 5 mm, mild perihepatic ascites HIDA 9/25:  Nonvisualization of gallbladder despite intravenous morphine augmentation. This finding is indicative of cystic duct obstruction.  Cystic duct obstruction is presumptive evidence of acute cholecystitis.  Common bile duct is patent as is evidenced by visualization of small bowel.   Intake/Output from previous day: 09/25 0701 - 09/26 0700 In: 3 [I.V.:3] Out: -  Intake/Output this shift: No intake/output data recorded.  General appearance: alert, cooperative and no distress Resp: clear to auscultation bilaterally Cardio: Irregular and tachycardic GI: soft, + BS, mild tenderness RUQ  Lab Results:  Recent Labs    08/07/18 0831 08/08/18 0350  WBC 14.6* 12.3*  HGB 15.7 13.5  HCT 45.6 40.1  PLT 238 257    BMET Recent Labs    08/07/18 0831 08/08/18 0350  NA 139 135  K 3.5 3.9  CL 99 99  CO2 27 27  GLUCOSE 105* 103*  BUN 9 10  CREATININE 0.88 0.84  CALCIUM 8.0* 7.8*   PT/INR Recent Labs    08/08/18 0350  LABPROT 13.2  INR 1.01    Recent Labs  Lab 08/04/18 0448 08/05/18 0500 08/06/18 0905 08/07/18 0831 08/08/18 0350  AST 27 31 33 32 32  ALT _0 ALKPHOS 164* 173* 165*  169* 128*  BILITOT 2.9* 2.9* 2.3* 2.0* 2.3*  PROT 5.4* 6.0* 5.7* 6.2* 5.1*  ALBUMIN 2.3* 2.4* 2.4* 2.6* 2.3*     Lipase     Component Value Date/Time   LIPASE 28 07/29/2018 0904     Medications: . allopurinol  200 mg Oral Daily  . amoxicillin-clavulanate  875 mg Oral Q12H  . digoxin  0.125 mg Oral Daily  . diltiazem  120 mg Oral Daily  . fluticasone  2 spray Each Nare Daily  . furosemide  40 mg Oral Daily  . heparin injection (subcutaneous)  5,000 Units Subcutaneous Q8H  . loratadine  10 mg Oral Daily  . mouth rinse  15 mL Mouth Rinse BID  . metoprolol tartrate  25 mg Oral BID  . polyethylene glycol  17 g Oral Daily  . sodium chloride flush  3 mL Intravenous Q12H    Assessment/Plan   Hypotension   Sepsis (HCC)   Pneumonia due to infectious organism   Alcoholic cirrhosis of liver with ascites (HCC)   Atrial fibrillation with RVR (HCC)   Acute respiratory failure with hypoxia (HCC)  Cholecystitis  - HIDA did not show gallbladder suggesting cystic duct obstruction - alk phos 169, Tbili 2.0, WBC 14.6 - AF low to moderate cardiac risk per Dr. Langston Reusing - Cardiology  FEN: heart healthy, NPO at midnight VTE: SCD's, heparin ID: no abx currenlty, see above for abx's this  admission  Foley: none Follow up: TBD   Plan:  Dr. Barry Dienes will review and make final decision, Continue NPO till decision is made.  Heparin at 6 AM.     LOS: 10 days    Lyndie Vanderloop 08/08/2018 [phone]

## 2018-08-08 NOTE — Progress Notes (Signed)
Patient ID: Samuel Flores, male   DOB: February 09, 1941, 77 y.o.   MRN: [id number]  PROGRESS NOTE    Samuel Flores  VEL:[id number] DOB: 1941-07-15 DOA: 07/29/2018 PCP: Alycia Rossetti, MD   Brief Narrative:  77 year old male with history of atrial fibrillation, alcohol abuse, gout, history of prostate cancer status post resection presented with weakness and was found to have sepsis secondary to E. coli bacteremia along with atrial fibrillation with rapid ventricular rate.  He was treated with IV antibiotics which has been changed to oral antibiotics.  He was also found to have abdominal distention and ultrasound revealed cirrhosis of liver and ascites for which he underwent paracentesis.  Ultrasound also showed sludging; he also had elevated alkaline phosphatase.  HIDA was ordered.  Cardiology was also consulted for atrial fibrillation with rapid ventricular rate.   Assessment & Plan:   Active Problems:   Hypotension   Sepsis ([place])   Pneumonia due to infectious organism   Alcoholic cirrhosis of liver with ascites (HCC)   Atrial fibrillation with RVR (H[facility])   Acute respiratory failure with hypoxia (HCC)  Probable acute cholecystitis -HIDA scan is positive for probable acute cholecystitis.   -General surgery following.  Currently n.p.o. we will follow further recommendation from general surgery.  Continue Augmentin  Sepsis secondary to E. coli bacteremia -Hemodynamically stable. -Antibiotic plan as below  E. coli bacteremia -Probably from SBP.  Treated with Unasyn initially, currently on Augmentin.  Complete 2-week course of antibiotic therapy  Leukocytosis -Improving.  Probably secondary to above -Monitor.  SBP in a patient with recent diagnosis of cirrhosis of liver with ascites -Abdominal pain has improved.  Continue Augmentin as above. -Status post paracentesis on 08/01/2018. -Continue Lasix 40 mg daily.  Strict input and output.  Daily weights  Persistent A. fib with  RVR -Heart rate is still intermittently elevated.  Cardiology following.  Continue metoprolol, Cardizem and digoxin.  Eliquis has been stopped because of bleeding risks in cirrhosis. -2D echo shows EF of 45 to 50%  Possible right lower lobe pneumonia -Treated with Unasyn/Zithromax.  Completed course  Acute respiratory failure with hypoxia -Probably secondary to pneumonia.  Resolved.  Currently on room air.  Continue incentive spirometry  Gout -Continue allopurinol   History of prostate cancer -Outpatient follow-up  Acute kidney injury -Baseline creatinine is 1.  Up to 1.65 on admission.  Resolved  Thrombocytopenia -In the setting of infection.  Resolved  Hypokalemia -Improved.  Repeat a.m. labs   DVT prophylaxis: Heparin Code Status: DNR Family Communication: None at bedside Disposition Plan: Depends on clinical outcome  Consultants:  cardiology/general surgery  Procedures:  Echo on 08/07/2018 Study Conclusions  - Left ventricle: The cavity size was normal. Wall thickness was   increased in a pattern of mild LVH. Systolic function was mildly   reduced. The estimated ejection fraction was in the range of 45%   to 50%. Diffuse hypokinesis. The study is not technically   sufficient to allow evaluation of LV diastolic function. - Mitral valve: Mildly thickened leaflets . There was mild   regurgitation. - Left atrium: Severely dilated. - Right ventricle: Moderately dilated and moderately hypokinetic. - Right atrium: The atrium was mildly dilated. - Tricuspid valve: There was mild regurgitation. - Pulmonary arteries: PA peak pressure: 40 mm Hg (S) + RAP. - Systemic veins: IVC is not visualized.  Impressions:  - Compared to a prior study in 09/2017, the LVEF is lower at   45-50%, global hypokinesis, tachycardia is noted, there  is mild   MR, severe LAE, mild RAE, the RV is moderately dilated and   hypokinetic, mild TR, RVSP 40 mmHg + RAP.  Antimicrobials:   Anti-infectives (From admission, onward)   Start     Dose/Rate Route Frequency Ordered Stop   08/06/18 2200  amoxicillin-clavulanate (AUGMENTIN) 400-57 MG/5ML suspension 875 mg     875 mg Oral Every 12 hours 08/06/18 1009 08/12/18 0959   08/05/18 1600  amoxicillin-clavulanate (AUGMENTIN) 875-125 MG per tablet 1 tablet     1 tablet Oral Every 12 hours 08/05/18 1419 08/06/18 0957   07/31/18 1600  Ampicillin-Sulbactam (UNASYN) 3 g in sodium chloride 0.9 % 100 mL IVPB  Status:  Discontinued     3 g 200 mL/hr over 30 Minutes Intravenous Every 6 hours 07/31/18 1402 08/05/18 1419   07/31/18 1000  vancomycin (VANCOCIN) 1,500 mg in sodium chloride 0.9 % 500 mL IVPB  Status:  Discontinued     1,500 mg 250 mL/hr over 120 Minutes Intravenous Every 48 hours 07/29/18 2021 07/30/18 1227   07/30/18 1400  cefTRIAXone (ROCEPHIN) 2 g in sodium chloride 0.9 % 100 mL IVPB  Status:  Discontinued     2 g 200 mL/hr over 30 Minutes Intravenous Every 24 hours 07/30/18 1227 07/31/18 1402   07/30/18 1400  azithromycin (ZITHROMAX) 500 mg in sodium chloride 0.9 % 250 mL IVPB     500 mg 250 mL/hr over 60 Minutes Intravenous Every 24 hours 07/30/18 1249 08/03/18 1742   07/29/18 2100  piperacillin-tazobactam (ZOSYN) IVPB 3.375 g  Status:  Discontinued     3.375 g 12.5 mL/hr over 240 Minutes Intravenous Every 8 hours 07/29/18 2018 07/30/18 1227   07/29/18 1000  vancomycin (VANCOCIN) 1,500 mg in sodium chloride 0.9 % 500 mL IVPB     1,500 mg 250 mL/hr over 120 Minutes Intravenous STAT 07/29/18 0945 07/29/18 1242   07/29/18 0945  piperacillin-tazobactam (ZOSYN) IVPB 3.375 g     3.375 g 100 mL/hr over 30 Minutes Intravenous  Once 07/29/18 0941 07/29/18 1018       Subjective: Patient seen and examined at bedside.  Patient denies any worsening abdominal pain, fever, nausea or vomiting.  He still feels weak.    Objective: Vitals:   08/08/18 0500 08/08/18 0621 08/08/18 0946 08/08/18 1224  BP:  (!) 114/56 (!) 110/58  133/71  Pulse:  (!) 105 80 (!) 116  Resp:  20 20 (!) 24  Temp:  97.7 F (36.5 C) 98.1 F (36.7 C) 97.8 F (36.6 C)  TempSrc:  Oral Oral Oral  SpO2:  95% 93% 97%  Weight: 83.2 kg     Height:        Intake/Output Summary (Last 24 hours) at 08/08/2018 1338 Last data filed at 08/08/2018 1223 Gross per 24 hour  Intake 3 ml  Output 2 ml  Net 1 ml   Filed Weights   07/29/18 1432 08/04/18 0454 08/08/18 0500  Weight: 82.9 kg 86.9 kg 83.2 kg    Examination:  General exam: Appears calm and comfortable.  Looks chronically ill.  No distress Respiratory system: Bilateral decreased breath sounds at bases with no wheezing Cardiovascular system: S1 & S2 heard, tachycardic intermittently Gastrointestinal system: Abdomen is nondistended, soft and nontender. Normal bowel sounds heard. Extremities: No cyanosis, edema   Data Reviewed: I have personally reviewed following labs and imaging studies  CBC: Recent Labs  Lab 08/02/18 0343 08/03/18 0324 08/04/18 0448 08/05/18 0500 08/07/18 0831 08/08/18 0350  WBC 13.7* 16.4* 13.7* 12.9*  14.6* 12.3*  NEUTROABS 11.3* 13.4* 11.0*  --  12.1* 9.8*  HGB 13.2 14.0 13.8 15.1 15.7 13.5  HCT 37.5* 40.7 39.0 44.5 45.6 40.1  MCV 90.8 91.5 90.3 92.1 92.3 92.4  PLT 105* 162 191 195 238 161   Basic Metabolic Panel: Recent Labs  Lab 08/04/18 0448 08/05/18 0500 08/06/18 0905 08/06/18 1607 08/07/18 0831 08/08/18 0350  NA 139 138 140  --  139 135  K 3.2* 2.7* 3.8 3.3* 3.5 3.9  CL 100 97* 98  --  99 99  CO2 28 27 29   --  27 27  GLUCOSE 107* 92 154*  --  105* 103*  BUN 18 14 10   --  9 10  CREATININE 0.82 0.81 0.83  --  0.88 0.84  CALCIUM 7.6* 7.7* 8.0*  --  8.0* 7.8*  MG  --  1.8  --   --  1.8 1.7   GFR: Estimated Creatinine Clearance: 71.3 mL/min (by C-G formula based on SCr of 0.84 mg/dL). Liver Function Tests: Recent Labs  Lab 08/04/18 0448 08/05/18 0500 08/06/18 0905 08/07/18 0831 08/08/18 0350  AST 27 31 33 32 32  ALT 16 18 17 20 17     ALKPHOS 164* 173* 165* 169* 128*  BILITOT 2.9* 2.9* 2.3* 2.0* 2.3*  PROT 5.4* 6.0* 5.7* 6.2* 5.1*  ALBUMIN 2.3* 2.4* 2.4* 2.6* 2.3*   No results for input(s): LIPASE, AMYLASE in the last 168 hours. No results for input(s): AMMONIA in the last 168 hours. Coagulation Profile: Recent Labs  Lab 08/08/18 0350  INR 1.01   Cardiac Enzymes: No results for input(s): CKTOTAL, CKMB, CKMBINDEX, TROPONINI in the last 168 hours. BNP (last 3 results) No results for input(s): PROBNP in the last 8760 hours. HbA1C: No results for input(s): HGBA1C in the last 72 hours. CBG: Recent Labs  Lab 08/02/18 1611  GLUCAP 111*   Lipid Profile: No results for input(s): CHOL, HDL, LDLCALC, TRIG, CHOLHDL, LDLDIRECT in the last 72 hours. Thyroid Function Tests: No results for input(s): TSH, T4TOTAL, FREET4, T3FREE, THYROIDAB in the last 72 hours. Anemia Panel: No results for input(s): VITAMINB12, FOLATE, FERRITIN, TIBC, IRON, RETICCTPCT in the last 72 hours. Sepsis Labs: No results for input(s): PROCALCITON, LATICACIDVEN in the last 168 hours.  Recent Results (from the past 240 hour(s))  MRSA PCR Screening     Status: None   Collection Time: 07/29/18  2:54 PM  Result Value Ref Range Status   MRSA by PCR NEGATIVE NEGATIVE Final    Comment:        The GeneXpert MRSA Assay (FDA approved for NASAL specimens only), is one component of a comprehensive MRSA colonization surveillance program. It is not intended to diagnose MRSA infection nor to guide or monitor treatment for MRSA infections. Performed at [facility], [place] [address], [place], [place] [address]   Culture, body fluid-bottle     Status: None   Collection Time: 08/01/18  3:38 PM  Result Value Ref Range Status   Specimen Description FLUID PERITONEAL  Final   Special Requests BOTTLES DRAWN AEROBIC AND ANAEROBIC  Final   Culture   Final    NO GROWTH 5 DAYS Performed at [facility], [place] [address],  [place], [place] [address]    Report Status 08/06/2018 FINAL  Final  Gram stain     Status: None   Collection Time: 08/01/18  3:38 PM  Result Value Ref Range Status   Specimen Description FLUID PERITONEAL  Final  Special Requests NONE  Final   Gram Stain   Final    ABUNDANT WBC PRESENT, PREDOMINANTLY PMN NO ORGANISMS SEEN Performed at [facility], [place] [address], [place], [place] [address]    Report Status 08/01/2018 FINAL  Final         Radiology Studies: Nm Hepato W/eject Fract  Result Date: 08/07/2018 CLINICAL DATA:  Upper abdominal pain EXAM: NUCLEAR MEDICINE HEPATOBILIARY IMAGING TECHNIQUE: Initially, sequential axial images were obtained over 60 minutes. After additional radiotracer injection in 3.5 mg morphine sulfate injection, additional images were obtained. RADIOPHARMACEUTICALS:  7.8 mCi Tc-65m Choletec IV. After the first hour, an additional 2.0 millicurie of Technetium 99 M Choletec administered intravenously. COMPARISON:  None. FINDINGS: Liver uptake of radiotracer is unremarkable. There is prompt visualization of small bowel indicating patency of the cystic and common bile ducts. Gallbladder did not visualize even after intravenous morphine administration an additional imaging. IMPRESSION: Nonvisualization of gallbladder despite intravenous morphine augmentation. This finding is indicative of cystic duct obstruction. Cystic duct obstruction is presumptive evidence of acute cholecystitis. Common bile duct is patent as is evidenced by visualization of small bowel. Electronically Signed   By: Lowella Grip III M.D.   On: 08/07/2018 12:26        Scheduled Meds: . allopurinol  200 mg Oral Daily  . amoxicillin-clavulanate  875 mg Oral Q12H  . digoxin  0.125 mg Oral Daily  . diltiazem  120 mg Oral Daily  . fluticasone  2 spray Each Nare Daily  . furosemide  40 mg Oral Daily  . heparin injection (subcutaneous)  5,000 Units Subcutaneous Q8H  . loratadine  10 mg Oral  Daily  . mouth rinse  15 mL Mouth Rinse BID  . metoprolol tartrate  25 mg Oral BID  . polyethylene glycol  17 g Oral Daily  . sodium chloride flush  3 mL Intravenous Q12H   Continuous Infusions: . sodium chloride Stopped (08/05/18 2032)     LOS: 10 days        Aline August, MD [facility] Pager [phone]  If 7PM-7AM, please contact night-coverage [email or web address] Password [facility] 08/08/2018, 1:38 PM

## 2018-08-08 NOTE — Anesthesia Preprocedure Evaluation (Signed)
Anesthesia Evaluation  Patient identified by MRN, date of birth, ID band Patient awake    Reviewed: Allergy & Precautions, NPO status , Patient's Chart, lab work & pertinent test results  Airway Mallampati: II  TM Distance: >3 FB Neck ROM: Full    Dental  (+) Dental Advisory Given   Pulmonary pneumonia, former smoker,    breath sounds clear to auscultation       Cardiovascular hypertension, Pt. on medications + CAD, +CHF and + DOE  + dysrhythmias Atrial Fibrillation  Rhythm:Regular Rate:Normal  Echo on 08/07/2018 Study Conclusions  - Left ventricle: The cavity size was normal. Wall thickness wasincreased in a pattern of mild LVH. Systolic function was mildlyreduced. The estimated ejection fraction was in the range of 45%to 50%. Diffuse hypokinesis. The study is not technicallysufficient to allow evaluation of LV diastolic function. - Mitral valve: Mildly thickened leaflets . There was mild regurgitation. - Left atrium: Severely dilated. - Right ventricle: Moderately dilated and moderately hypokinetic. - Right atrium: The atrium was mildly dilated. - Tricuspid valve: There was mild regurgitation. - Pulmonary arteries: PA peak pressure: 40 mm Hg (S) + RAP. - Systemic veins: IVC is not visualized.   Neuro/Psych negative neurological ROS     GI/Hepatic (+) Cirrhosis   ascites  substance abuse  alcohol use,   Endo/Other  negative endocrine ROS  Renal/GU Renal InsufficiencyRenal disease     Musculoskeletal   Abdominal   Peds  Hematology negative hematology ROS (+)   Anesthesia Other Findings   Reproductive/Obstetrics                             Lab Results  Component Value Date   WBC 12.3 (H) 08/08/2018   HGB 13.5 08/08/2018   HCT 40.1 08/08/2018   MCV 92.4 08/08/2018   PLT 257 08/08/2018   Lab Results  Component Value Date   CREATININE 0.84 08/08/2018   BUN 10 08/08/2018    NA 135 08/08/2018   K 3.9 08/08/2018   CL 99 08/08/2018   CO2 27 08/08/2018    Anesthesia Physical Anesthesia Plan  ASA: III  Anesthesia Plan: General   Post-op Pain Management:    Induction: Intravenous and Rapid sequence  PONV Risk Score and Plan: 2 and Ondansetron and Dexamethasone  Airway Management Planned: Oral ETT  Additional Equipment:   Intra-op Plan:   Post-operative Plan: Extubation in OR and Possible Post-op intubation/ventilation  Informed Consent: I have reviewed the patients History and Physical, chart, labs and discussed the procedure including the risks, benefits and alternatives for the proposed anesthesia with the patient or authorized representative who has indicated his/her understanding and acceptance.   Dental advisory given  Plan Discussed with: CRNA  Anesthesia Plan Comments:         Anesthesia Quick Evaluation

## 2018-08-08 NOTE — Anesthesia Procedure Notes (Signed)
Procedure Name: Intubation Date/Time: 08/08/2018 4:43 PM Performed by: West Pugh, CRNA Pre-anesthesia Checklist: Patient identified, Emergency Drugs available, Suction available, Patient being monitored and Timeout performed Patient Re-evaluated:Patient Re-evaluated prior to induction Oxygen Delivery Method: Circle system utilized Preoxygenation: Pre-oxygenation with 100% oxygen Induction Type: IV induction, Cricoid Pressure applied and Rapid sequence Laryngoscope Size: Mac and 4 Grade View: Grade I Tube type: Oral Tube size: 7.5 mm Number of attempts: 1 Airway Equipment and Method: Stylet Placement Confirmation: ETT inserted through vocal cords under direct vision,  positive ETCO2,  CO2 detector and breath sounds checked- equal and bilateral Secured at: 22 cm Tube secured with: Tape Dental Injury: Teeth and Oropharynx as per pre-operative assessment

## 2018-08-08 NOTE — Progress Notes (Signed)
PT Cancellation Note  Patient Details Name: DAITON COWLES MRN: [id number] DOB: 07-24-1941   Cancelled Treatment:    Reason Eval/Treat Not Completed: Patient at procedure or test/unavailable--surgery. Will check back another day.   Weston Anna, PT Acute Rehabilitation Services Pager: [phone] Office: [phone]

## 2018-08-08 NOTE — Progress Notes (Signed)
Echocardiogram findings noted. Mild LV dysfunction and PH likely secondary to Afib. 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 Perioperative cardiac risk for gall bladder surgery is low to moderate. I do not think it will be mitigated by any further investigation at this time. Recommend proceeding with surgery as necessary. Diltiazem drip could be used perioperatively to control heart rate, as tolerated by blood pressure. Unfortunately, due to his liver disease, amiodarone is not a good option. Do not recommend rhythm control therapy at this time due to inability to anticoagulate. We will be available for any questions during the perioperative period.  Nigel Mormon, MD [facility] Pager: [phone] Office: [phone] If no answer Cell [phone]

## 2018-08-08 NOTE — Op Note (Signed)
Laparoscopic Cholecystectomy  Indications: This patient presents with acute cholecystitis and will undergo laparoscopic cholecystectomy. Pt has cirrhosis with low volume ascites.  Pre-operative Diagnosis: acute cholecystitis with calculous  Post-operative Diagnosis: Same  Surgeon: Stark Klein   Assistants: Annye English, MD  Anesthesia: General endotracheal anesthesia and local  ASA Class: 3  Procedure Details  The patient was seen again in the Holding Room. The risks, benefits, complications, treatment options, and expected outcomes were discussed with the patient. The possibilities of  bleeding, recurrent infection, damage to nearby structures, the need for additional procedures, failure to diagnose a condition, the possible need to convert to an open procedure, and creating a complication requiring transfusion or operation were discussed with the patient. The likelihood of improving the patient's symptoms with return to their baseline status is good.    The patient and/or family concurred with the proposed plan, giving informed consent. The site of surgery properly noted. The patient was taken to Operating Room, and the procedure verified as Laparoscopic Cholecystectomy with Intraoperative Cholangiogram. A Time Out was held and the above information confirmed.  Prior to the induction of general anesthesia, antibiotic prophylaxis was administered. General endotracheal anesthesia was then administered and tolerated well. After the induction, the abdomen was prepped with Chloraprep and draped in the sterile fashion. The patient was positioned in the supine position.  Local anesthetic agent was injected into the skin near the umbilicus and an incision made. We dissected down to the abdominal fascia with blunt dissection.  The fascia was incised vertically and we entered the peritoneal cavity bluntly.  A pursestring suture of 0-Vicryl was placed around the fascial opening.  The Hasson cannula was  inserted and secured with the stay suture.  Pneumoperitoneum was then created with CO2 and tolerated well without any adverse changes in the patient's vital signs. An 11-mm port was placed in the subxiphoid position.  Two 5-mm ports were placed in the right upper quadrant. All skin incisions were infiltrated with a local anesthetic agent before making the incision and placing the trocars.   We positioned the patient in reverse Trendelenburg, tilted slightly to the patient's left.  The gallbladder was identified, the fundus grasped and retracted cephalad. Pt had nodule liver and bilious ascites consistent with cirrhosis.   Adhesions were lysed bluntly and with the electrocautery where indicated, taking care not to injure any adjacent organs or viscus. The infundibulum was grasped and retracted laterally, exposing the peritoneum overlying the triangle of Calot. This was then divided and exposed in a blunt fashion. The cystic artery anterior and was clipped and divided.  The cystic duct was small in caliber, around 2 mm and was short, so a cholangiogram was not attempted.    The gallbladder was dissected from the liver bed in retrograde fashion with the electrocautery. The gallbladder was removed and placed in an Endocatch bag.  The gallbladder and Endocatch bag were then removed through the umbilical port site.  The liver bed was irrigated and inspected. Hemostasis was achieved with the electrocautery. Copious irrigation was utilized and was repeatedly aspirated until clear.    We again inspected the right upper quadrant for hemostasis.  Due to the presence of cirrhosis, SNOW hemostatic agent was placed.  Pneumoperitoneum was released as we removed the trocars.   The pursestring suture was used to close the umbilical fascia.  4-0 Monocryl was used to close the skin.   The skin was cleaned and dry, and Dermabond was applied. The patient was then extubated and  brought to the recovery room in stable condition.  Instrument, sponge, and needle counts were correct at closure and at the conclusion of the case.   Findings: Cirrhosis, bilious ascites, acute cholecystitis.    Estimated Blood Loss: 50 mL         Drains: none          Specimens: Gallbladder to pathology       Complications: None; patient tolerated the procedure well.         Disposition: PACU - hemodynamically stable.         Condition: stable

## 2018-08-08 NOTE — Transfer of Care (Signed)
Immediate Anesthesia Transfer of Care Note  Patient: Samuel Flores  Procedure(s) Performed: LAPAROSCOPIC CHOLECYSTECTOMY (N/A Abdomen)  Patient Location: PACU  Anesthesia Type:General  Level of Consciousness: awake, alert  and patient cooperative  Airway & Oxygen Therapy: Patient Spontanous Breathing and Patient connected to face mask oxygen  Post-op Assessment: Report given to RN and Post -op Vital signs reviewed and stable  Post vital signs: Reviewed and stable  Last Vitals:  Vitals Value Taken Time  BP 111/58 08/08/2018  5:02 PM  Temp 36.7 C 08/08/2018  5:02 PM  Pulse 93 08/08/2018  5:05 PM  Resp 17 08/08/2018  5:05 PM  SpO2 99 % 08/08/2018  5:05 PM  Vitals shown include unvalidated device data.  Last Pain:  Vitals:   08/08/18 1428  TempSrc: Oral  PainSc:       Patients Stated Pain Goal: 0 (13/14/38 8875)  Complications: No apparent anesthesia complications

## 2018-08-09 ENCOUNTER — Encounter (HOSPITAL_COMMUNITY): Payer: Self-pay | Admitting: General Surgery

## 2018-08-09 LAB — CBC WITH DIFFERENTIAL/PLATELET
BASOS ABS: 0 10*3/uL (ref 0.0–0.1)
Basophils Relative: 0 %
Eosinophils Absolute: 0 10*3/uL (ref 0.0–0.7)
Eosinophils Relative: 0 %
HEMATOCRIT: 39.9 % (ref 39.0–52.0)
Hemoglobin: 13.6 g/dL (ref 13.0–17.0)
LYMPHS PCT: 5 %
Lymphs Abs: 0.6 10*3/uL — ABNORMAL LOW (ref 0.7–4.0)
MCH: 31.3 pg (ref 26.0–34.0)
MCHC: 34.1 g/dL (ref 30.0–36.0)
MCV: 91.7 fL (ref 78.0–100.0)
Monocytes Absolute: 0.1 10*3/uL (ref 0.1–1.0)
Monocytes Relative: 1 %
NEUTROS PCT: 94 %
Neutro Abs: 9.9 10*3/uL — ABNORMAL HIGH (ref 1.7–7.7)
PLATELETS: 286 10*3/uL (ref 150–400)
RBC: 4.35 MIL/uL (ref 4.22–5.81)
RDW: 14.8 % (ref 11.5–15.5)
WBC: 10.5 10*3/uL (ref 4.0–10.5)

## 2018-08-09 LAB — COMPREHENSIVE METABOLIC PANEL
ALK PHOS: 130 U/L — AB (ref 38–126)
ALT: 27 U/L (ref 0–44)
AST: 46 U/L — AB (ref 15–41)
Albumin: 2.7 g/dL — ABNORMAL LOW (ref 3.5–5.0)
Anion gap: 14 (ref 5–15)
BUN: 16 mg/dL (ref 8–23)
CHLORIDE: 96 mmol/L — AB (ref 98–111)
CO2: 27 mmol/L (ref 22–32)
CREATININE: 1.09 mg/dL (ref 0.61–1.24)
Calcium: 8.3 mg/dL — ABNORMAL LOW (ref 8.9–10.3)
GFR calc non Af Amer: >60 mL/min (ref >60)
GLUCOSE: 152 mg/dL — AB (ref 70–99)
Potassium: 4.1 mmol/L (ref 3.5–5.1)
SODIUM: 137 mmol/L (ref 135–145)
Total Bilirubin: 1.9 mg/dL — ABNORMAL HIGH (ref 0.3–1.2)
Total Protein: 6 g/dL — ABNORMAL LOW (ref 6.5–8.1)

## 2018-08-09 LAB — MAGNESIUM: MAGNESIUM: 1.7 mg/dL (ref 1.7–2.4)

## 2018-08-09 MED ORDER — AMOXICILLIN-POT CLAVULANATE 875-125 MG PO TABS
1.0000 | ORAL_TABLET | Freq: Two times a day (BID) | ORAL | Status: DC
  Administered 2018-08-09 – 2018-08-10 (×3): 1 via ORAL
  Filled 2018-08-09 (×3): qty 1

## 2018-08-09 MED ORDER — SALINE SPRAY 0.65 % NA SOLN
1.0000 | NASAL | Status: DC | PRN
  Filled 2018-08-09: qty 44

## 2018-08-09 NOTE — Care Management Note (Signed)
Case Management Note  Patient Details  Name: Samuel Flores MRN: [id number] Date of Birth: 1941/09/28  Subjective/Objective:                  Acute cholecystitis, 02 via [place],iv lr at 50cc/hrs, iv zosyn  Action/Plan: Will follow for progression of care and clinical status. Will follow for case management needs none present at this time.  Expected Discharge Date:  (unknown)               Expected Discharge Plan:  Home/Self Care  In-House Referral:     Discharge planning Services  CM Consult  Post Acute Care Choice:    Choice offered to:     DME Arranged:    DME Agency:     HH Arranged:    HH Agency:     Status of Service:  In process, will continue to follow  If discussed at Long Length of Stay Meetings, dates discussed:    Additional Comments:  Leeroy Cha, RN 08/09/2018, 8:56 AM

## 2018-08-09 NOTE — Progress Notes (Signed)
Physical Therapy Treatment Patient Details Name: Samuel Flores MRN: [id number] DOB: [birth year] Today's Date: 08/09/2018    History of Present Illness Pt admitted from home and dx with sepsis, hypotension, PNA, acute resp failure, AKI, A-fib with RVR and Ascites.  Pt with hx of TIA, CAD, subdural hematoma, back surgery and ETOH abuse, S/P Laparoscopic cholecystectomy 08/08/18,     PT Comments    Patient ambulated with RW x 400' . HR up to 164, down to 110 after rest. RN aware. O2 saturation 96%.    Follow Up Recommendations  Supervision for mobility/OOB;Home health PT(pt may decline)     Equipment Recommendations       Recommendations for Other Services       Precautions / Restrictions Precautions Precautions: Fall Precaution Comments: monitor HR Restrictions Weight Bearing Restrictions: No    Mobility  Bed Mobility Overal bed mobility: Modified Independent                Transfers   Equipment used: Rolling walker (2 wheeled) Transfers: Sit to/from Stand Sit to Stand: Supervision            Ambulation/Gait Ambulation/Gait assistance: Min guard Gait Distance (Feet): 400 Feet Assistive device: Rolling walker (2 wheeled) Gait Pattern/deviations: Step-through pattern     General Gait Details: gait is brisk, steady. HR 147-164 while ambulating. Nursing aware   Stairs             Wheelchair Mobility    Modified Rankin (Stroke Patients Only)       Balance     Sitting balance-Leahy Scale: Good     Standing balance support: Bilateral upper extremity supported;During functional activity Standing balance-Leahy Scale: Fair                              Cognition Arousal/Alertness: Awake/alert Behavior During Therapy: WFL for tasks assessed/performed Overall Cognitive Status: Within Functional Limits for tasks assessed                                        Exercises      General Comments         Pertinent Vitals/Pain Pain Assessment: No/denies pain    Home Living                      Prior Function            PT Goals (current goals can now be found in the care plan section) Progress towards PT goals: Progressing toward goals    Frequency    Min 3X/week      PT Plan Current plan remains appropriate    Co-evaluation              AM-PAC PT "6 Clicks" Daily Activity  Outcome Measure  Difficulty turning over in bed (including adjusting bedclothes, sheets and blankets)?: None Difficulty moving from lying on back to sitting on the side of the bed? : None Difficulty sitting down on and standing up from a chair with arms (e.g., wheelchair, bedside commode, etc,.)?: A Little Help needed moving to and from a bed to chair (including a wheelchair)?: A Little Help needed walking in hospital room?: A Little Help needed climbing 3-5 steps with a railing? : A Lot 6 Click Score: 19    End of Session   Activity Tolerance:  Patient tolerated treatment well Patient left: in bed;with call bell/phone within reach;with bed alarm set Nurse Communication: Mobility status PT Visit Diagnosis: History of falling (Z91.81);Unsteadiness on feet (R26.81);Muscle weakness (generalized) (M62.81)     Time: 1753-0104 PT Time Calculation (min) (ACUTE ONLY): 20 min  Charges:  $Gait Training: 8-22 mins                     Cross [name] Pager [phone] Office [phone]    [name] 08/09/2018, 4:54 PM

## 2018-08-09 NOTE — Progress Notes (Signed)
1 Day Post-Op    CC:  Abdominal pain  Subjective: He looks great this AM, still in AF, port sites look fine.  He is alert, up in the bed, no complaints of pain.  No issues last night post op.    Objective: Vital signs in last 24 hours: Temp:  [97.3 F (36.3 C)-98.8 F (37.1 C)] 97.5 F (36.4 C) (09/27 0351) Pulse Rate:  [74-116] 91 (09/27 0600) Resp:  [18-26] 18 (09/27 0600) BP: (104-133)/(54-71) 104/54 (09/27 0600) SpO2:  [91 %-100 %] 94 % (09/27 0600) Weight:  [82.4 kg] 82.4 kg (09/27 0459) Last BM Date: 08/05/18 NPO nothing recorded 990 IV recorded 1202 urine Stool x 1 Afebrile, VSS Bilirubin is better WBC dow to 10.5   Intake/Output from previous day: 09/26 0701 - 09/27 0700 In: 990.8 [I.V.:640.8; IV Piggyback:350] Out: 1203 [Urine:1202; Stool:1] Intake/Output this shift: No intake/output data recorded.  General appearance: alert, cooperative and no distress Resp: clear to auscultation bilaterally GI: soft, sore, sites all look good, No complaints of abdominal pain, I told him that may change when he gets up.    Lab Results:  Recent Labs    08/08/18 0350 08/09/18 0326  WBC 12.3* 10.5  HGB 13.5 13.6  HCT 40.1 39.9  PLT 257 286    BMET Recent Labs    08/08/18 0350 08/09/18 0326  NA 135 137  K 3.9 4.1  CL 99 96*  CO2 27 27  GLUCOSE 103* 152*  BUN 10 16  CREATININE 0.84 1.09  CALCIUM 7.8* 8.3*   PT/INR Recent Labs    08/08/18 0350  LABPROT 13.2  INR 1.01    Recent Labs  Lab 08/05/18 0500 08/06/18 0905 08/07/18 0831 08/08/18 0350 08/09/18 0326  AST 31 33 32 32 46*  ALT 18 17 20 17 27   ALKPHOS 173* 165* 169* 128* 130*  BILITOT 2.9* 2.3* 2.0* 2.3* 1.9*  PROT 6.0* 5.7* 6.2* 5.1* 6.0*  ALBUMIN 2.4* 2.4* 2.6* 2.3* 2.7*     Lipase     Component Value Date/Time   LIPASE 28 07/29/2018 0904     Medications: . acetaminophen  1,000 mg Oral Q8H  . allopurinol  200 mg Oral Daily  . digoxin  0.125 mg Oral Daily  . diltiazem  120 mg  Oral Daily  . fluticasone  2 spray Each Nare Daily  . furosemide  40 mg Oral Daily  . heparin injection (subcutaneous)  5,000 Units Subcutaneous Q8H  . ketorolac  15 mg Intravenous Q8H  . loratadine  10 mg Oral Daily  . mouth rinse  15 mL Mouth Rinse BID  . metoprolol tartrate  25 mg Oral BID  . polyethylene glycol  17 g Oral Daily  . sodium chloride flush  3 mL Intravenous Q12H   . lactated ringers 50 mL/hr at 08/08/18 1437  . piperacillin-tazobactam (ZOSYN)  IV 3.375 g (08/09/18 0505)   Anti-infectives (From admission, onward)   Start     Dose/Rate Route Frequency Ordered Stop   08/08/18 2200  piperacillin-tazobactam (ZOSYN) IVPB 3.375 g     3.375 g 12.5 mL/hr over 240 Minutes Intravenous Every 8 hours 08/08/18 1404     08/06/18 2200  amoxicillin-clavulanate (AUGMENTIN) 400-57 MG/5ML suspension 875 mg  Status:  Discontinued     875 mg Oral Every 12 hours 08/06/18 1009 08/08/18 1404   08/05/18 1600  amoxicillin-clavulanate (AUGMENTIN) 875-125 MG per tablet 1 tablet     1 tablet Oral Every 12 hours 08/05/18 1419 08/06/18 0957  07/31/18 1600  Ampicillin-Sulbactam (UNASYN) 3 g in sodium chloride 0.9 % 100 mL IVPB  Status:  Discontinued     3 g 200 mL/hr over 30 Minutes Intravenous Every 6 hours 07/31/18 1402 08/05/18 1419   07/31/18 1000  vancomycin (VANCOCIN) 1,500 mg in sodium chloride 0.9 % 500 mL IVPB  Status:  Discontinued     1,500 mg 250 mL/hr over 120 Minutes Intravenous Every 48 hours 07/29/18 2021 07/30/18 1227   07/30/18 1400  cefTRIAXone (ROCEPHIN) 2 g in sodium chloride 0.9 % 100 mL IVPB  Status:  Discontinued     2 g 200 mL/hr over 30 Minutes Intravenous Every 24 hours 07/30/18 1227 07/31/18 1402   07/30/18 1400  azithromycin (ZITHROMAX) 500 mg in sodium chloride 0.9 % 250 mL IVPB     500 mg 250 mL/hr over 60 Minutes Intravenous Every 24 hours 07/30/18 1249 08/03/18 1742   07/29/18 2100  piperacillin-tazobactam (ZOSYN) IVPB 3.375 g  Status:  Discontinued     3.375  g 12.5 mL/hr over 240 Minutes Intravenous Every 8 hours 07/29/18 2018 07/30/18 1227   07/29/18 1000  vancomycin (VANCOCIN) 1,500 mg in sodium chloride 0.9 % 500 mL IVPB     1,500 mg 250 mL/hr over 120 Minutes Intravenous STAT 07/29/18 0945 07/29/18 1242   07/29/18 0945  piperacillin-tazobactam (ZOSYN) IVPB 3.375 g     3.375 g 100 mL/hr over 30 Minutes Intravenous  Once 07/29/18 0941 07/29/18 1018      Assessment/Plan Hypotension Sepsis  Pneumonia due to infectious organism Alcoholic cirrhosis of liver with ascites  Atrial fibrillation with RVR - On Eliquis at home Acute respiratory failure with hypoxia    Acute cholecystitis with calculous Laparoscopic cholecystectomy 08/08/18, Dr. Stark Klein  QGB:EEFEO healthy, VTE: SCD's,heparin ID: Zosyn 9/16, then 9/26 pre op; Vancomycin 9/16-9/17; Zithromax 9/17 - 9/21; Rocephin 9/17 - 9/18; Unasyn 9/17 - 9/23; Augmentin 9/23 - 9/26 =>> Zosyn pre op - We can stop antibiotics from surgical standpoint Foley:none Follow up:TBD   Plan:  From our standpoint he can advance his diet and mobilize.  He does not need further antibiotics from our standpoint.  He can go back on anticoagulation this afternoon for DVT prophylaxis.  I would give him another day before systemic anticoagulation with Eliquis, If you use heparin I would do it without a bolus.  He has follow up in the AVS along with instructions.  He can go home when medically ready.    LOS: 11 days    Sweet Jarvis 08/09/2018 [phone]

## 2018-08-09 NOTE — Progress Notes (Signed)
Patient ID: Samuel Flores, male   DOB: 11-02-1941, 77 y.o.   MRN: [id number]  PROGRESS NOTE    [name]  PPI:[id number] DOB: February 19, 1941 DOA: 07/29/2018 PCP: Alycia Rossetti, MD   Brief Narrative:  77 year old male with history of atrial fibrillation, alcohol abuse, gout, history of prostate cancer status post resection presented with weakness and was found to have sepsis secondary to E. coli bacteremia along with atrial fibrillation with rapid ventricular rate.  He was treated with IV antibiotics which has been changed to oral antibiotics.  He was also found to have abdominal distention and ultrasound revealed cirrhosis of liver and ascites for which he underwent paracentesis.  Ultrasound also showed sludging; he also had elevated alkaline phosphatase.  HIDA was ordered.  Cardiology was also consulted for atrial fibrillation with rapid ventricular rate.  He was found to have acute cholecystitis and underwent cholecystectomy on 08/08/2018.   Assessment & Plan:   Active Problems:   Hypotension   Sepsis ([facility])   Pneumonia due to infectious organism   Alcoholic cirrhosis of liver with ascites (HCC)   Atrial fibrillation with RVR (HCC)   Acute respiratory failure with hypoxia (HCC)  Acute cholecystitis -Status post laparoscopic cholecystectomy.  Will advance diet as per general surgery recommendations. -Incentive spirometry -Outpatient follow-up with general surgery.  Wound care as per general surgery recommendations -Transfer to telemetry  Sepsis secondary to E. coli bacteremia -Hemodynamically stable. -Antibiotic plan as below  E. coli bacteremia -Probably from SBP.  Treated with Unasyn initially, currently on Augmentin.  Complete 2-week course of antibiotic therapy  Leukocytosis -Resolved.    SBP in a patient with recent diagnosis of cirrhosis of liver with ascites -Abdominal pain has improved.  Continue Augmentin as above. -Status post paracentesis on  08/01/2018. -Continue Lasix 40 mg daily.  Strict input and output.  Daily weights -Outpatient evaluation and follow-up by GI  Persistent A. fib with RVR -Still intermittently tachycardic.  Cardiology following.  Continue metoprolol, Cardizem and digoxin.  Eliquis has been stopped because of bleeding risks in cirrhosis.  Will not resume full dose anticoagulation and will defer to cardiology. -2D echo shows EF of 45 to 50%  Possible right lower lobe pneumonia -Treated with Unasyn/Zithromax.  Completed course  Acute respiratory failure with hypoxia -Probably secondary to pneumonia.  Resolved.  Currently on room air.  Continue incentive spirometry  Gout -Continue allopurinol  History of prostate cancer -Outpatient follow-up  Acute kidney injury -Baseline creatinine is 1.  Up to 1.65 on admission.  Resolved  Thrombocytopenia -In the setting of infection.  Resolved  Hypokalemia -Improved.  Repeat a.m. labs   DVT prophylaxis: Heparin Code Status: DNR Family Communication: None at bedside Disposition Plan: Probable discharge tomorrow if remains clinically stable  Consultants:  cardiology/general surgery  Procedures:  Echo on 08/07/2018 Study Conclusions  - Left ventricle: The cavity size was normal. Wall thickness was   increased in a pattern of mild LVH. Systolic function was mildly   reduced. The estimated ejection fraction was in the range of 45%   to 50%. Diffuse hypokinesis. The study is not technically   sufficient to allow evaluation of LV diastolic function. - Mitral valve: Mildly thickened leaflets . There was mild   regurgitation. - Left atrium: Severely dilated. - Right ventricle: Moderately dilated and moderately hypokinetic. - Right atrium: The atrium was mildly dilated. - Tricuspid valve: There was mild regurgitation. - Pulmonary arteries: PA peak pressure: 40 mm Hg (S) + RAP. - Systemic veins:  IVC is not visualized.  Impressions:  - Compared to a prior  study in 09/2017, the LVEF is lower at   45-50%, global hypokinesis, tachycardia is noted, there is mild   MR, severe LAE, mild RAE, the RV is moderately dilated and   hypokinetic, mild TR, RVSP 40 mmHg + RAP.  Antimicrobials:  Anti-infectives (From admission, onward)   Start     Dose/Rate Route Frequency Ordered Stop   08/08/18 2200  piperacillin-tazobactam (ZOSYN) IVPB 3.375 g     3.375 g 12.5 mL/hr over 240 Minutes Intravenous Every 8 hours 08/08/18 1404     08/06/18 2200  amoxicillin-clavulanate (AUGMENTIN) 400-57 MG/5ML suspension 875 mg  Status:  Discontinued     875 mg Oral Every 12 hours 08/06/18 1009 08/08/18 1404   08/05/18 1600  amoxicillin-clavulanate (AUGMENTIN) 875-125 MG per tablet 1 tablet     1 tablet Oral Every 12 hours 08/05/18 1419 08/06/18 0957   07/31/18 1600  Ampicillin-Sulbactam (UNASYN) 3 g in sodium chloride 0.9 % 100 mL IVPB  Status:  Discontinued     3 g 200 mL/hr over 30 Minutes Intravenous Every 6 hours 07/31/18 1402 08/05/18 1419   07/31/18 1000  vancomycin (VANCOCIN) 1,500 mg in sodium chloride 0.9 % 500 mL IVPB  Status:  Discontinued     1,500 mg 250 mL/hr over 120 Minutes Intravenous Every 48 hours 07/29/18 2021 07/30/18 1227   07/30/18 1400  cefTRIAXone (ROCEPHIN) 2 g in sodium chloride 0.9 % 100 mL IVPB  Status:  Discontinued     2 g 200 mL/hr over 30 Minutes Intravenous Every 24 hours 07/30/18 1227 07/31/18 1402   07/30/18 1400  azithromycin (ZITHROMAX) 500 mg in sodium chloride 0.9 % 250 mL IVPB     500 mg 250 mL/hr over 60 Minutes Intravenous Every 24 hours 07/30/18 1249 08/03/18 1742   07/29/18 2100  piperacillin-tazobactam (ZOSYN) IVPB 3.375 g  Status:  Discontinued     3.375 g 12.5 mL/hr over 240 Minutes Intravenous Every 8 hours 07/29/18 2018 07/30/18 1227   07/29/18 1000  vancomycin (VANCOCIN) 1,500 mg in sodium chloride 0.9 % 500 mL IVPB     1,500 mg 250 mL/hr over 120 Minutes Intravenous STAT 07/29/18 0945 07/29/18 1242   07/29/18 0945   piperacillin-tazobactam (ZOSYN) IVPB 3.375 g     3.375 g 100 mL/hr over 30 Minutes Intravenous  Once 07/29/18 0941 07/29/18 1018        Subjective: Patient seen and examined at bedside.  Patient feels better.  Currently denies any abdominal pain, nausea or vomiting. Objective: Vitals:   08/09/18 0459 08/09/18 0600 08/09/18 0738 08/09/18 0800  BP:  (!) 104/54  109/66  Pulse:  91  (!) 103  Resp:  18  (!) 26  Temp:   97.6 F (36.4 C)   TempSrc:   Oral   SpO2:  94%  92%  Weight: 82.4 kg     Height:        Intake/Output Summary (Last 24 hours) at 08/09/2018 0916 Last data filed at 08/09/2018 0800 Gross per 24 hour  Intake 1026.57 ml  Output 1203 ml  Net -176.43 ml   Filed Weights   08/04/18 0454 08/08/18 0500 08/09/18 0459  Weight: 86.9 kg 83.2 kg 82.4 kg    Examination:  General exam: Appears calm and comfortable.  Looks better.  No distress Respiratory system: Bilateral decreased breath sounds at bases with some crackles cardiovascular system: S1 & S2 heard, tachycardic  Gastrointestinal system: Abdomen is nondistended,  soft and nontender. Normal bowel sounds heard. Extremities: No cyanosis, edema   Data Reviewed: I have personally reviewed following labs and imaging studies  CBC: Recent Labs  Lab 08/03/18 0324 08/04/18 0448 08/05/18 0500 08/07/18 0831 08/08/18 0350 08/09/18 0326  WBC 16.4* 13.7* 12.9* 14.6* 12.3* 10.5  NEUTROABS 13.4* 11.0*  --  12.1* 9.8* 9.9*  HGB 14.0 13.8 15.1 15.7 13.5 13.6  HCT 40.7 39.0 44.5 45.6 40.1 39.9  MCV 91.5 90.3 92.1 92.3 92.4 91.7  PLT 162 191 195 238 257 220   Basic Metabolic Panel: Recent Labs  Lab 08/05/18 0500 08/06/18 0905 08/06/18 1607 08/07/18 0831 08/08/18 0350 08/09/18 0326  NA 138 140  --  139 135 137  K 2.7* 3.8 3.3* 3.5 3.9 4.1  CL 97* 98  --  99 99 96*  CO2 27 29  --  27 27 27   GLUCOSE 92 154*  --  105* 103* 152*  BUN 14 10  --  9 10 16   CREATININE 0.81 0.83  --  0.88 0.84 1.09  CALCIUM 7.7* 8.0*   --  8.0* 7.8* 8.3*  MG 1.8  --   --  1.8 1.7 1.7   GFR: Estimated Creatinine Clearance: 55 mL/min (by C-G formula based on SCr of 1.09 mg/dL). Liver Function Tests: Recent Labs  Lab 08/05/18 0500 08/06/18 0905 08/07/18 0831 08/08/18 0350 08/09/18 0326  AST 31 33 32 32 46*  ALT 18 17 20 17 27   ALKPHOS 173* 165* 169* 128* 130*  BILITOT 2.9* 2.3* 2.0* 2.3* 1.9*  PROT 6.0* 5.7* 6.2* 5.1* 6.0*  ALBUMIN 2.4* 2.4* 2.6* 2.3* 2.7*   No results for input(s): LIPASE, AMYLASE in the last 168 hours. No results for input(s): AMMONIA in the last 168 hours. Coagulation Profile: Recent Labs  Lab 08/08/18 0350  INR 1.01   Cardiac Enzymes: No results for input(s): CKTOTAL, CKMB, CKMBINDEX, TROPONINI in the last 168 hours. BNP (last 3 results) No results for input(s): PROBNP in the last 8760 hours. HbA1C: No results for input(s): HGBA1C in the last 72 hours. CBG: Recent Labs  Lab 08/02/18 1611  GLUCAP 111*   Lipid Profile: No results for input(s): CHOL, HDL, LDLCALC, TRIG, CHOLHDL, LDLDIRECT in the last 72 hours. Thyroid Function Tests: No results for input(s): TSH, T4TOTAL, FREET4, T3FREE, THYROIDAB in the last 72 hours. Anemia Panel: No results for input(s): VITAMINB12, FOLATE, FERRITIN, TIBC, IRON, RETICCTPCT in the last 72 hours. Sepsis Labs: No results for input(s): PROCALCITON, LATICACIDVEN in the last 168 hours.  Recent Results (from the past 240 hour(s))  Culture, body fluid-bottle     Status: None   Collection Time: 08/01/18  3:38 PM  Result Value Ref Range Status   Specimen Description FLUID PERITONEAL  Final   Special Requests BOTTLES DRAWN AEROBIC AND ANAEROBIC  Final   Culture   Final    NO GROWTH 5 DAYS Performed at [facility], [place] [address], [place], [place] [address]    Report Status 08/06/2018 FINAL  Final  Gram stain     Status: None   Collection Time: 08/01/18  3:38 PM  Result Value Ref Range Status   Specimen Description FLUID PERITONEAL  Final     Special Requests NONE  Final   Gram Stain   Final    ABUNDANT WBC PRESENT, PREDOMINANTLY PMN NO ORGANISMS SEEN Performed at [facility], [place] [address], [place], [place] [address]    Report Status 08/01/2018 FINAL  Final  Surgical  pcr screen     Status: None   Collection Time: 08/08/18  2:01 PM  Result Value Ref Range Status   MRSA, PCR NEGATIVE NEGATIVE Final   Staphylococcus aureus NEGATIVE NEGATIVE Final    Comment: (NOTE) The Xpert SA Assay (FDA approved for NASAL specimens in patients 69 years of age and older), is one component of a comprehensive surveillance program. It is not intended to diagnose infection nor to guide or monitor treatment. Performed at [facility], [place] [address], [place], [place] [address]          Radiology Studies: Nm Hepato W/eject Fract  Result Date: 08/07/2018 CLINICAL DATA:  Upper abdominal pain EXAM: NUCLEAR MEDICINE HEPATOBILIARY IMAGING TECHNIQUE: Initially, sequential axial images were obtained over 60 minutes. After additional radiotracer injection in 3.5 mg morphine sulfate injection, additional images were obtained. RADIOPHARMACEUTICALS:  7.8 mCi Tc-51m Choletec IV. After the first hour, an additional 2.0 millicurie of Technetium 99 M Choletec administered intravenously. COMPARISON:  None. FINDINGS: Liver uptake of radiotracer is unremarkable. There is prompt visualization of small bowel indicating patency of the cystic and common bile ducts. Gallbladder did not visualize even after intravenous morphine administration an additional imaging. IMPRESSION: Nonvisualization of gallbladder despite intravenous morphine augmentation. This finding is indicative of cystic duct obstruction. Cystic duct obstruction is presumptive evidence of acute cholecystitis. Common bile duct is patent as is evidenced by visualization of small bowel. Electronically Signed   By: Lowella Grip III M.D.   On: 08/07/2018 12:26         Scheduled Meds: . acetaminophen  1,000 mg Oral Q8H  . allopurinol  200 mg Oral Daily  . digoxin  0.125 mg Oral Daily  . diltiazem  120 mg Oral Daily  . fluticasone  2 spray Each Nare Daily  . furosemide  40 mg Oral Daily  . heparin injection (subcutaneous)  5,000 Units Subcutaneous Q8H  . ketorolac  15 mg Intravenous Q8H  . loratadine  10 mg Oral Daily  . mouth rinse  15 mL Mouth Rinse BID  . metoprolol tartrate  25 mg Oral BID  . polyethylene glycol  17 g Oral Daily  . sodium chloride flush  3 mL Intravenous Q12H   Continuous Infusions: . lactated ringers 50 mL/hr at 08/08/18 1437  . piperacillin-tazobactam (ZOSYN)  IV 3.375 g (08/09/18 0505)     LOS: 11 days        Aline August, MD [facility] Pager [phone]  If 7PM-7AM, please contact night-coverage [email or web address] Password [facility] 08/09/2018, 9:16 AM

## 2018-08-10 LAB — MAGNESIUM: Magnesium: 1.9 mg/dL (ref 1.7–2.4)

## 2018-08-10 LAB — COMPREHENSIVE METABOLIC PANEL
ALK PHOS: 116 U/L (ref 38–126)
ALT: 46 U/L — AB (ref 0–44)
AST: 69 U/L — AB (ref 15–41)
Albumin: 2.6 g/dL — ABNORMAL LOW (ref 3.5–5.0)
Anion gap: 9 (ref 5–15)
BILIRUBIN TOTAL: 1.4 mg/dL — AB (ref 0.3–1.2)
BUN: 18 mg/dL (ref 8–23)
CALCIUM: 8.3 mg/dL — AB (ref 8.9–10.3)
CHLORIDE: 100 mmol/L (ref 98–111)
CO2: 29 mmol/L (ref 22–32)
CREATININE: 0.9 mg/dL (ref 0.61–1.24)
GFR calc Af Amer: >60 mL/min (ref >60)
GFR calc non Af Amer: >60 mL/min (ref >60)
Glucose, Bld: 134 mg/dL — ABNORMAL HIGH (ref 70–99)
Potassium: 3.9 mmol/L (ref 3.5–5.1)
Sodium: 138 mmol/L (ref 135–145)
Total Protein: 5.6 g/dL — ABNORMAL LOW (ref 6.5–8.1)

## 2018-08-10 LAB — CBC WITH DIFFERENTIAL/PLATELET
Basophils Absolute: 0 10*3/uL (ref 0.0–0.1)
Basophils Relative: 0 %
EOS PCT: 0 %
Eosinophils Absolute: 0 10*3/uL (ref 0.0–0.7)
HEMATOCRIT: 37.9 % — AB (ref 39.0–52.0)
Hemoglobin: 12.9 g/dL — ABNORMAL LOW (ref 13.0–17.0)
LYMPHS PCT: 6 %
Lymphs Abs: 0.9 10*3/uL (ref 0.7–4.0)
MCH: 31.3 pg (ref 26.0–34.0)
MCHC: 34 g/dL (ref 30.0–36.0)
MCV: 92 fL (ref 78.0–100.0)
MONO ABS: 0.6 10*3/uL (ref 0.1–1.0)
MONOS PCT: 4 %
NEUTROS ABS: 13.8 10*3/uL — AB (ref 1.7–7.7)
Neutrophils Relative %: 90 %
PLATELETS: 252 10*3/uL (ref 150–400)
RBC: 4.12 MIL/uL — ABNORMAL LOW (ref 4.22–5.81)
RDW: 14.9 % (ref 11.5–15.5)
WBC: 15.4 10*3/uL — ABNORMAL HIGH (ref 4.0–10.5)

## 2018-08-10 MED ORDER — METOPROLOL TARTRATE 25 MG PO TABS: 25.0000 mg | ORAL_TABLET | Freq: Two times a day (BID) | ORAL | 0 refills | Status: DC

## 2018-08-10 MED ORDER — AMOXICILLIN-POT CLAVULANATE 875-125 MG PO TABS: 1.0000 | ORAL_TABLET | Freq: Two times a day (BID) | ORAL | 0 refills | Status: AC

## 2018-08-10 MED ORDER — TRAMADOL HCL 50 MG PO TABS: 50.0000 mg | ORAL_TABLET | Freq: Four times a day (QID) | ORAL | 0 refills | Status: DC | PRN

## 2018-08-10 NOTE — Care Management Note (Signed)
Case Management Note  Patient Details  Name: Samuel Flores MRN: [id number] Date of Birth: 1941-07-15  Subjective/Objective:                    Action/Plan:  Spoke with patient, he declines HH at this time. He lives at home with his wife, has RW, understands that he can obtain [facility] through his PCP if he later changes his mind. Wife will provide transport home. No other CM needs identified at this time.  Expected Discharge Date:  08/10/18               Expected Discharge Plan:  Home/Self Care  In-House Referral:     Discharge planning Services  CM Consult  Post Acute Care Choice:    Choice offered to:  Patient  DME Arranged:    DME Agency:     HH Arranged:  Patient Refused [facility] Agency:     Status of Service:  Completed, signed off  If discussed at [employer] of Stay Meetings, dates discussed:    Additional Comments:  Carles Collet, RN 08/10/2018, 11:02 AM

## 2018-08-10 NOTE — Anesthesia Postprocedure Evaluation (Signed)
Anesthesia Post Note  Patient: Samuel Flores  Procedure(s) Performed: LAPAROSCOPIC CHOLECYSTECTOMY (N/A Abdomen)     Patient location during evaluation: PACU Anesthesia Type: General Level of consciousness: awake and alert Pain management: pain level controlled Vital Signs Assessment: post-procedure vital signs reviewed and stable Respiratory status: spontaneous breathing, nonlabored ventilation, respiratory function stable and patient connected to nasal cannula oxygen Cardiovascular status: blood pressure returned to baseline and stable Postop Assessment: no apparent nausea or vomiting Anesthetic complications: no    Last Vitals:  Vitals:   08/09/18 2041 08/10/18 0436  BP: (!) 115/51 102/61  Pulse: 85 (!) 51  Resp: 18 16  Temp: 36.4 C 36.7 C  SpO2: 96% 94%    Last Pain:  Vitals:   08/10/18 0638  TempSrc:   PainSc: 0-No pain                 Tiajuana Amass

## 2018-08-10 NOTE — Discharge Summary (Signed)
Physician Discharge Summary  BROOKS KINNAN LEX:[id number] DOB: [birth year] DOA: 07/29/2018  PCP: Alycia Rossetti, MD  Admit date: 07/29/2018 Discharge date: 08/10/2018  Admitted From: Home Disposition: Home  Recommendations for Outpatient Follow-up:  1. Follow up with PCP in 1 week with repeat CBC/BMP 2. Follow-up with Dr. [name] as an outpatient 3. Follow-up with general surgery.  Wound care as per general surgery recommendations 4. Patient would benefit from outpatient evaluation and follow-up by GI 5. Follow-up in the ED if symptoms worsen or new appear   Home Health: No.  Patient refused home health Equipment/Devices: None Discharge Condition: Stable CODE STATUS: DNR Diet recommendation: Heart Healthy   Brief/Interim Summary: 77 year old male with history of atrial fibrillation, alcohol abuse, gout, history of prostate cancer status post resection presented with weakness and was found to have sepsis secondary to E. coli bacteremia along with atrial fibrillation with rapid ventricular rate.  He was treated with IV antibiotics which has been changed to oral antibiotics.  He was also found to have abdominal distention and ultrasound revealed cirrhosis of liver and ascites for which he underwent paracentesis.  Ultrasound also showed sludging; he also had elevated alkaline phosphatase.  HIDA was ordered.  Cardiology was also consulted for atrial fibrillation with rapid ventricular rate.  He was found to have acute cholecystitis and underwent cholecystectomy on 08/08/2018.  Patient condition has remained stable postoperatively.  He will be discharged home with outpatient follow-up with PCP and various consultants.  Discharge Diagnoses:  Active Problems:   Hypotension   Sepsis ([place])   Pneumonia due to infectious organism   Alcoholic cirrhosis of liver with ascites (HCC)   Atrial fibrillation with RVR (HCC)   Acute respiratory failure with hypoxia (HCC)  Acute cholecystitis -Status  post laparoscopic cholecystectomy.  -Tolerating diet.  General surgery has signed off. -Outpatient follow-up with general surgery.  Wound care as per general surgery recommendations  Sepsis secondary to E. coli bacteremia -Resolved.  Hemodynamically stable. -Antibiotic plan as below  E. coli bacteremia -Probably from SBP.  Treated with Unasyn initially, currently on Augmentin.  Complete 2-week course of antibiotic therapy.  He will be discharged on 2 more days of oral Augmentin.  Leukocytosis -Had resolved but has some leukocytosis today, most likely reactive.  Outpatient follow-up  SBP in a patient with recent diagnosis of cirrhosis of liver with ascites -Abdominal pain has improved.  Continue Augmentin as above. -Status post paracentesis on 08/01/2018. -Currently on Lasix.  Switch to home torsemide on discharge.  Spironolactone on hold for now because of relatively low blood pressures.  Spironolactone can be resumed as an outpatient.  -Outpatient evaluation and follow-up by GI  Persistent A. fib with RVR -Currently rate controlled.  Cardiology evaluated the patient during this hospitalization.  Patient had to be stopped also started on digoxin.  Currently on digoxin, metoprolol and Cardizem.   Eliquis has been stopped because of bleeding risks in cirrhosis.  Will not resumeanticoagulation and will defer to cardiology.  Outpatient follow-up with cardiology.  Patient will be discharged on Cardizem 180 mg daily, metoprolol 25 milligrams twice daily.  Communicated with Dr. [name] regarding the same and he is agreeable. -2D echo shows EF of 45 to 50%  Possible right lower lobe pneumonia -Treated with Unasyn/Zithromax.  Completed course  Acute respiratory failure with hypoxia -Probably secondary to pneumonia.  Resolved.  Currently on room air.    Gout -Continue allopurinol  History of prostate cancer -Outpatient follow-up  Acute kidney injury -Baseline creatinine is  1.  Up to  1.65 on admission.  Resolved  Thrombocytopenia -In the setting of infection.  Resolved  Hypokalemia -Improved.    Discharge Instructions  Discharge Instructions    Ambulatory referral to Gastroenterology   Complete by:  As directed    New diagnosis of cirrhosis and ascites   Call MD for:  difficulty breathing, headache or visual disturbances   Complete by:  As directed    Call MD for:  extreme fatigue   Complete by:  As directed    Call MD for:  hives   Complete by:  As directed    Call MD for:  persistant dizziness or light-headedness   Complete by:  As directed    Call MD for:  persistant nausea and vomiting   Complete by:  As directed    Call MD for:  redness, tenderness, or signs of infection (pain, swelling, redness, odor or green/yellow discharge around incision site)   Complete by:  As directed    Call MD for:  severe uncontrolled pain   Complete by:  As directed    Call MD for:  temperature >100.4   Complete by:  As directed    Diet - low sodium heart healthy   Complete by:  As directed    Increase activity slowly   Complete by:  As directed      Allergies as of 08/10/2018   No Known Allergies     Medication List    STOP taking these medications   apixaban 5 MG Tabs tablet Commonly known as:  ELIQUIS   spironolactone 50 MG tablet Commonly known as:  ALDACTONE     TAKE these medications   allopurinol 100 MG tablet Commonly known as:  ZYLOPRIM TAKE 2 TABLETS BY MOUTH EVERY DAY   amoxicillin-clavulanate 875-125 MG tablet Commonly known as:  AUGMENTIN Take 1 tablet by mouth every 12 (twelve) hours for 2 days.   diltiazem 180 MG 24 hr capsule Commonly known as:  CARDIZEM CD Take 180 mg by mouth daily.   fluticasone 50 MCG/ACT nasal spray Commonly known as:  FLONASE Place 2 sprays into both nostrils daily. What changed:    when to take this  reasons to take this   folic acid 1 MG tablet Commonly known as:  FOLVITE Take 1 tablet (1 mg  total) by mouth daily.   metoprolol tartrate 25 MG tablet Commonly known as:  LOPRESSOR Take 1 tablet (25 mg total) by mouth 2 (two) times daily. What changed:    medication strength  how much to take  Another medication with the same name was removed. Continue taking this medication, and follow the directions you see here.   torsemide 20 MG tablet Commonly known as:  DEMADEX Take 10 mg by mouth daily.   traMADol 50 MG tablet Commonly known as:  ULTRAM Take 1 tablet (50 mg total) by mouth every 6 (six) hours as needed for moderate pain.      Follow-up Information    [facility] Follow up on 08/22/2018.   Specialty:  General Surgery Why:  your appointment is at 3:45 pm. .  Be at the office 30 minutes early for check in.  Bring photo ID and insurance information.   Contact information: [address] [place] [address] [phone]        Alycia Rossetti, MD. Schedule an appointment as soon as possible for a visit in 1 week(s).   Specialty:  Family Medicine Why:  with repeat cbc/bmp Contact information: [place] [address] [phone]        Adrian Prows, MD. Schedule an appointment as soon as possible for a visit in 1 week(s).   Specialty:  Cardiology Contact information: [place] [address] [phone]          No Known Allergies  Consultations:  Cardiology/general surgery   Procedures/Studies: Ct Abdomen Pelvis Wo Contrast  Result Date: 07/31/2018 CLINICAL DATA:  Diffuse abdominal pain and distension today. EXAM: CT ABDOMEN AND PELVIS WITHOUT CONTRAST TECHNIQUE: Multidetector CT imaging of the abdomen and pelvis was performed following the standard protocol without IV contrast. COMPARISON:  Abdominal radiographs July 31, 2018 and CT chest July 30, 2018. FINDINGS: LOWER CHEST: Small bilateral pleural effusions with worsening underlying consolidation and punctate  calcifications. Lingular atelectasis. Mild bibasilar bronchial wall thickening. Heart size is mildly enlarged. Severe coronary artery calcification. Small pericardial effusion. HEPATOBILIARY: Nodular hepatic contour, mildly enlarged caudate lobe and LEFT lobe of the liver. Distended gallbladder with mild gallbladder wall thickening. PANCREAS: Normal. SPLEEN: Normal. ADRENALS/URINARY TRACT: Kidneys are orthotopic, demonstrating normal size and morphology. No nephrolithiasis, hydronephrosis; limited assessment for renal masses by nonenhanced CT. The unopacified ureters are normal in course and caliber. Urinary bladder decompressed by Foley catheter. Normal adrenal glands. STOMACH/BOWEL: The stomach, small and large bowel are normal in course and caliber without inflammatory changes, sensitivity decreased by lack of enteric contrast. Mild colonic diverticulosis. Normal appendix. VASCULAR/LYMPHATIC: Aortoiliac vessels are normal in course and caliber. Moderate calcific atherosclerosis. No lymphadenopathy by CT size criteria. Greater than expected number small lymph nodes in upper abdomen. REPRODUCTIVE: Status post prostatectomy. OTHER: Small to moderate volume ascites. Somewhat matted appearance of the omentum LEFT abdomen (for example, series 2, image 54). Single subcentimeter bubble of gas adjacent to the duodenum (series 2, image 47), posterior to the common bile duct. Small bilateral fat containing inguinal hernias. MUSCULOSKELETAL: Non-acute. Severe L5-S1 degenerative disc. Grade 1 L4-5 anterolisthesis without spondylolysis. Osteopenia and Schmorl's nodes. IMPRESSION: 1. Possible acute cholecystitis though, given cirrhosis and small to moderate volume ascites this may be reactive. Ultrasound may be of added value. 2. Matted appearance to the omentum LEFT abdomen, carcinomatosis not excluded. 3. Single bubble of gas adjacent to the duodenum, possible diverticulum, less likely extraluminal. 4. Small pleural effusions  with worsening bilateral lower lobe atelectasis versus pneumonia. Aortic Atherosclerosis (ICD10-I70.0). Electronically Signed   By: Elon Alas M.D.   On: 07/31/2018 17:04   Dg Chest 1 View  Result Date: 07/29/2018 CLINICAL DATA:  Shortness of breath, hypertension EXAM: CHEST  1 VIEW COMPARISON:  09/30/2017 FINDINGS: Low volumes with bibasilar atelectasis. Heart is borderline in size. No effusions or acute bony abnormality. IMPRESSION: Low lung volumes with bibasilar atelectasis. Electronically Signed   By: Rolm Baptise M.D.   On: 07/29/2018 09:19   X-ray Chest Pa And Lateral  Result Date: 07/30/2018 CLINICAL DATA:  77 year old male with a diagnosis of pneumonia EXAM: CHEST - 2 VIEW COMPARISON:  Prior chest x-ray yesterday 07/29/2018 FINDINGS: Stable cardiac and mediastinal contours with borderline cardiomegaly. Atherosclerotic calcifications again noted in the transverse aorta. Mild patchy and linear airspace opacities in the lingula and left lower lobe. No pleural effusion, pneumothorax or pulmonary edema. The right lung is clear. Visualized upper abdominal bowel gas pattern is unremarkable. No acute osseous abnormality. Incompletely imaged cervicothoracic anterior stabilization hardware. IMPRESSION: 1. Mild patchy and linear airspace opacities in the lingula and left lower lobe may reflect  bronchopneumonia or acute on chronic bronchitis. 2. Stable mild cardiomegaly. 3.  Aortic Atherosclerosis (ICD10-170.0) Electronically Signed   By: Jacqulynn Cadet M.D.   On: 07/30/2018 09:24   Ct Chest Wo Contrast  Result Date: 07/30/2018 CLINICAL DATA:  Shortness of breath. History of hypertension, prostate cancer. EXAM: CT CHEST WITHOUT CONTRAST TECHNIQUE: Multidetector CT imaging of the chest was performed following the standard protocol without IV contrast. COMPARISON:  Chest radiograph July 30, 2018 and CT chest September 26, 2017 FINDINGS: CARDIOVASCULAR: The heart is mildly enlarged. Severe  coronary artery calcifications. No pericardial effusion. Thoracic aorta is normal course and caliber, mild calcific atherosclerosis. MEDIASTINUM/NODES: No mediastinal mass. No lymphadenopathy by CT size criteria. Normal appearance of thoracic esophagus though not tailored for evaluation. LUNGS/PLEURA: Tracheobronchial tree is patent, no pneumothorax. Mild bronchial wall thickening. Minimal pleural effusions. RIGHT lower lobe consolidation with air bronchograms. Punctate RIGHT lower lobe calcifications. Mild lower lobe bronchiectasis. Stable 3 mm subsolid LEFT upper lobe subpleural nodule. UPPER ABDOMEN: Elevated RIGHT hemidiaphragm. Nodular cirrhotic liver. Distended gallbladder with mild suspected wall edema, similar to prior CT. MUSCULOSKELETAL: Nonacute. ACDF. Severe RIGHT acromioclavicular osteoarthrosis. IMPRESSION: 1. Minimal pleural effusions and RIGHT lower lobe atelectasis and/or pneumonia. 2. Cirrhosis. Mild gallbladder distension and edema appear chronic though if there are referable symptoms consider RIGHT upper quadrant ultrasound. Aortic Atherosclerosis (ICD10-I70.0). Electronically Signed   By: Elon Alas M.D.   On: 07/30/2018 17:38   Nm Hepato W/eject Fract  Result Date: 08/07/2018 CLINICAL DATA:  Upper abdominal pain EXAM: NUCLEAR MEDICINE HEPATOBILIARY IMAGING TECHNIQUE: Initially, sequential axial images were obtained over 60 minutes. After additional radiotracer injection in 3.5 mg morphine sulfate injection, additional images were obtained. RADIOPHARMACEUTICALS:  7.8 mCi Tc-6mCholetec IV. After the first hour, an additional 2.0 millicurie of Technetium 99 M Choletec administered intravenously. COMPARISON:  None. FINDINGS: Liver uptake of radiotracer is unremarkable. There is prompt visualization of small bowel indicating patency of the cystic and common bile ducts. Gallbladder did not visualize even after intravenous morphine administration an additional imaging. IMPRESSION:  Nonvisualization of gallbladder despite intravenous morphine augmentation. This finding is indicative of cystic duct obstruction. Cystic duct obstruction is presumptive evidence of acute cholecystitis. Common bile duct is patent as is evidenced by visualization of small bowel. Electronically Signed   By: WLowella GripIII M.D.   On: 08/07/2018 12:26   UKoreaParacentesis  Result Date: 08/01/2018 INDICATION: Patient with history of alcoholic cirrhosis with ascites. Request is made for diagnostic and therapeutic paracentesis. EXAM: ULTRASOUND GUIDED DIAGNOSTIC AND THERAPEUTIC PARACENTESIS MEDICATIONS: 10 mL of 1% lidocaine COMPLICATIONS: None immediate. PROCEDURE: Informed written consent was obtained from the patient after a discussion of the risks, benefits and alternatives to treatment. A timeout was performed prior to the initiation of the procedure. Initial ultrasound scanning demonstrates a small amount of ascites within the left lower abdominal quadrant. The left lower abdomen was prepped and draped in the usual sterile fashion. 1% lidocaine was used for local anesthesia. Following this, a 6 Fr Safe-T-Centesis catheter was introduced. An ultrasound image was saved for documentation purposes. The paracentesis was performed. The catheter was removed and a dressing was applied. The patient tolerated the procedure well without immediate post procedural complication. FINDINGS: A total of approximately 400 mL of clear amber fluid was removed. Samples were sent to the laboratory as requested by the clinical team. IMPRESSION: Successful ultrasound-guided paracentesis yielding 400 mL of peritoneal fluid. Read by: AEarley Abide PA-C Electronically Signed   By: JCorrie MckusickD.O.  On: 08/01/2018 15:11   Dg Chest Port 1 View  Result Date: 08/04/2018 CLINICAL DATA:  Shortness of breath EXAM: PORTABLE CHEST 1 VIEW COMPARISON:  08/01/2018 FINDINGS: Cardiomegaly with vascular congestion. Improving aeration in the  lung bases. Continued mild bibasilar atelectasis. No visible significant effusions. IMPRESSION: Cardiomegaly with vascular congestion and bibasilar atelectasis. Improving aeration in the bases since prior study. Electronically Signed   By: Rolm Baptise M.D.   On: 08/04/2018 16:01   Dg Chest Port 1 View  Result Date: 08/01/2018 CLINICAL DATA:  Shortness of breath.  Abdominal pain. EXAM: PORTABLE CHEST 1 VIEW COMPARISON:  CT 07/30/2018.  Chest x-ray 07/30/2018. FINDINGS: Cardiomegaly. No pulmonary venous congestion. Low lung volumes with bibasilar atelectasis/infiltrates. No interim improvement from prior exam. No pneumothorax. Prior cervicothoracic fusion. IMPRESSION: Number low lung volumes with bibasilar atelectasis/infiltrates. No interim improvement from prior exam. 2.  Stable cardiomegaly. Electronically Signed   By: Marcello Moores  Register   On: 08/01/2018 12:06   Dg Chest Port 1 View  Result Date: 07/30/2018 CLINICAL DATA:  Worsening right-sided chest pain since this morning. History of coronary artery disease, sepsis, pneumonia. EXAM: PORTABLE CHEST 1 VIEW COMPARISON:  PA and lateral chest x-ray of May 29, 2018. FINDINGS: The lungs are mildly hypoinflated. The right hemidiaphragm is slightly higher than the left. There is patchy density at the right lung base. There is hazy increased density obscuring the lateral aspect of the left hemidiaphragm. The cardiac silhouette is enlarged. The pulmonary vascularity is normal. There is calcification in the wall of the aortic arch. The bony thorax exhibits no acute abnormality. IMPRESSION: Bibasilar atelectasis or pneumonia not greatly changed from the earlier study. Enlargement of the cardiac silhouette without pulmonary vascular congestion. Thoracic aortic atherosclerosis. Electronically Signed   By: David  Martinique M.D.   On: 07/30/2018 13:20   Dg Abd Portable 1v  Result Date: 07/31/2018 CLINICAL DATA:  Abdominal pain EXAM: PORTABLE ABDOMEN - 1 VIEW COMPARISON:   07/30/2018 FINDINGS: Scattered large and small bowel gas is noted. The small bowel loops in the mid abdomen are minimally prominent. This may be related to an underlying ileus. No free air is noted. No mass lesion is seen. Degenerative changes of lumbar spine are noted. IMPRESSION: Mild prominence of small bowel gas pattern. Correlation with the physical exam is recommended. CT may be helpful for further evaluation. Electronically Signed   By: Inez Catalina M.D.   On: 07/31/2018 11:56   US Abdomen Limited Ruq  Result Date: 08/04/2018 CLINICAL DATA:  Elevated alk-phos, ascites EXAM: ULTRASOUND ABDOMEN LIMITED RIGHT UPPER QUADRANT COMPARISON:  CT abdomen/pelvis dated 07/31/2018 FINDINGS: Gallbladder: No gallstones. Layering gallbladder sludge. Mild gallbladder wall thickening. No pericholecystic fluid. Negative sonographic Murphy's sign. Common bile duct: Diameter: 5 mm Liver: Coarse hepatic echotexture. Mildly nodular hepatic contour. No focal hepatic lesion is seen. Portal vein is patent on color Doppler imaging with normal direction of blood flow towards the liver. Additional comments: Mild perihepatic ascites. IMPRESSION: Coarse hepatic echotexture and mildly nodular hepatic contour, raising the possibility of cirrhosis. Mild gallbladder wall thickening with layering gallbladder sludge. This appearance is likely reactive. No associated sonographic findings to suggest acute cholecystitis. Mild perihepatic ascites. Electronically Signed   By: Julian Hy M.D.   On: 08/04/2018 13:45   US Abdomen Limited Ruq  Result Date: 07/30/2018 CLINICAL DATA:  Abnormal liver function studies EXAM: ULTRASOUND ABDOMEN LIMITED RIGHT UPPER QUADRANT COMPARISON:  Right upper quadrant abdominal ultrasound of September 26, 2017 FINDINGS: Gallbladder: No gallstones or wall thickening  visualized. No sonographic Murphy sign noted by sonographer. Common bile duct: Diameter: 4.2 mm Liver: The hepatic echotexture is heterogeneous.  The surface contour of the liver remains smooth. There is no focal mass nor ductal dilation. Portal vein is patent on color Doppler imaging with normal direction of blood flow towards the liver. IMPRESSION: Heterogeneous hepatic echotexture may reflect hepatocellular disease. There is no discrete mass nor ductal dilation however. Normal appearance of the gallbladder and common bile duct. Electronically Signed   By: David  Martinique M.D.   On: 07/30/2018 14:48    Echo on 08/07/2018 Study Conclusions  - Left ventricle: The cavity size was normal. Wall thickness was increased in a pattern of mild LVH. Systolic function was mildly reduced. The estimated ejection fraction was in the range of 45% to 50%. Diffuse hypokinesis. The study is not technically sufficient to allow evaluation of LV diastolic function. - Mitral valve: Mildly thickened leaflets . There was mild regurgitation. - Left atrium: Severely dilated. - Right ventricle: Moderately dilated and moderately hypokinetic. - Right atrium: The atrium was mildly dilated. - Tricuspid valve: There was mild regurgitation. - Pulmonary arteries: PA peak pressure: 40 mm Hg (S) + RAP. - Systemic veins: IVC is not visualized.  Impressions:  - Compared to a prior study in 09/2017, the LVEF is lower at 45-50%, global hypokinesis, tachycardia is noted, there is mild MR, severe LAE, mild RAE, the RV is moderately dilated and hypokinetic, mild TR, RVSP 40 mmHg + RAP.    Subjective: Patient seen and examined at bedside.  He feels much better and wants to go home.  No overnight fever, nausea or vomiting.  He is tolerating diet.  Discharge Exam: Vitals:   08/09/18 2041 08/10/18 0436  BP: (!) 115/51 102/61  Pulse: 85 (!) 51  Resp: 18 16  Temp: 97.6 F (36.4 C) 98.1 F (36.7 C)  SpO2: 96% 94%   Vitals:   08/09/18 1200 08/09/18 1453 08/09/18 2041 08/10/18 0436  BP: 113/66 121/70 (!) 115/51 102/61  Pulse: 98 (!) 108 85 (!) 51   Resp:  _0 Temp:  (!) 97.5 F (36.4 C) 97.6 F (36.4 C) 98.1 F (36.7 C)  TempSrc:  Oral Oral Oral  SpO2: 98% 97% 96% 94%  Weight:    79 kg  Height:        General: Pt is alert, awake, not in acute distress Cardiovascular: Slightly bradycardic intermittently, S1/S2 + Respiratory: bilateral decreased breath sounds at bases Abdominal: Soft, NT, ND, bowel sounds + Extremities: no edema, no cyanosis    The results of significant diagnostics from this hospitalization (including imaging, microbiology, ancillary and laboratory) are listed below for reference.     Microbiology: Recent Results (from the past 240 hour(s))  Culture, body fluid-bottle     Status: None   Collection Time: 08/01/18  3:38 PM  Result Value Ref Range Status   Specimen Description FLUID PERITONEAL  Final   Special Requests BOTTLES DRAWN AEROBIC AND ANAEROBIC  Final   Culture   Final    NO GROWTH 5 DAYS Performed at [facility], [place] [address], [place], [place] [address]    Report Status 08/06/2018 FINAL  Final  Gram stain     Status: None   Collection Time: 08/01/18  3:38 PM  Result Value Ref Range Status   Specimen Description FLUID PERITONEAL  Final   Special Requests NONE  Final   Gram Stain   Final    ABUNDANT WBC  PRESENT, PREDOMINANTLY PMN NO ORGANISMS SEEN Performed at [facility], [place] [address], [place], [place] [address]    Report Status 08/01/2018 FINAL  Final  Surgical pcr screen     Status: None   Collection Time: 08/08/18  2:01 PM  Result Value Ref Range Status   MRSA, PCR NEGATIVE NEGATIVE Final   Staphylococcus aureus NEGATIVE NEGATIVE Final    Comment: (NOTE) The Xpert SA Assay (FDA approved for NASAL specimens in patients 65 years of age and older), is one component of a comprehensive surveillance program. It is not intended to diagnose infection nor to guide or monitor treatment. Performed at [facility], [place] [address], [place], [place] [address]      Labs: BNP (last 3 results) Recent Labs    09/29/17 1019 07/29/18 0904  BNP 611.9* 712.1*   Basic Metabolic Panel: Recent Labs  Lab 08/05/18 0500 08/06/18 0905 08/06/18 1607 08/07/18 0831 08/08/18 0350 08/09/18 0326 08/10/18 0517  NA 138 140  --  139 135 137 138  K 2.7* 3.8 3.3* 3.5 3.9 4.1 3.9  CL 97* 98  --  99 99 96* 100  CO2 27 29  --  _0 GLUCOSE 92 154*  --  105* 103* 152* 134*  BUN 14 10  --  _1 CREATININE 0.81 0.83  --  0.88 0.84 1.09 0.90  CALCIUM 7.7* 8.0*  --  8.0* 7.8* 8.3* 8.3*  MG 1.8  --   --  1.8 1.7 1.7 1.9   Liver Function Tests: Recent Labs  Lab 08/06/18 0905 08/07/18 0831 08/08/18 0350 08/09/18 0326 08/10/18 0517  AST 33 32 32 46* 69*  ALT _2 46*  ALKPHOS 165* 169* 128* 130* 116  BILITOT 2.3* 2.0* 2.3* 1.9* 1.4*  PROT 5.7* 6.2* 5.1* 6.0* 5.6*  ALBUMIN 2.4* 2.6* 2.3* 2.7* 2.6*   No results for input(s): LIPASE, AMYLASE in the last 168 hours. No results for input(s): AMMONIA in the last 168 hours. CBC: Recent Labs  Lab 08/04/18 0448 08/05/18 0500 08/07/18 0831 08/08/18 0350 08/09/18 0326 08/10/18 0517  WBC 13.7* 12.9* 14.6* 12.3* 10.5 15.4*  NEUTROABS 11.0*  --  12.1* 9.8* 9.9* 13.8*  HGB 13.8 15.1 15.7 13.5 13.6 12.9*  HCT 39.0 44.5 45.6 40.1 39.9 37.9*  MCV 90.3 92.1 92.3 92.4 91.7 92.0  PLT 191 195 238 257 286 252   Cardiac Enzymes: No results for input(s): CKTOTAL, CKMB, CKMBINDEX, TROPONINI in the last 168 hours. BNP: Invalid input(s): POCBNP CBG: No results for input(s): GLUCAP in the last 168 hours. D-Dimer No results for input(s): DDIMER in the last 72 hours. Hgb A1c No results for input(s): HGBA1C in the last 72 hours. Lipid Profile No results for input(s): CHOL, HDL, LDLCALC, TRIG, CHOLHDL, LDLDIRECT in the last 72 hours. Thyroid function studies No results for input(s): TSH, T4TOTAL, T3FREE, THYROIDAB in the last 72 hours.  Invalid input(s):  FREET3 Anemia work up No results for input(s): VITAMINB12, FOLATE, FERRITIN, TIBC, IRON, RETICCTPCT in the last 72 hours. Urinalysis    Component Value Date/Time   COLORURINE AMBER (A) 07/29/2018 1328   APPEARANCEUR HAZY (A) 07/29/2018 1328   LABSPEC 1.019 07/29/2018 1328   PHURINE 5.0 07/29/2018 1328   GLUCOSEU NEGATIVE 07/29/2018 1328   HGBUR NEGATIVE 07/29/2018 1328   BILIRUBINUR SMALL (A) 07/29/2018 1328   KETONESUR 5 (A) 07/29/2018 1328   PROTEINUR NEGATIVE 07/29/2018 1328   UROBILINOGEN 1.0 01/01/2013  [place] 07/29/2018 [place] 07/29/2018 1328   Sepsis Labs Invalid input(s): PROCALCITONIN,  WBC,  LACTICIDVEN Microbiology Recent Results (from the past 240 hour(s))  Culture, body fluid-bottle     Status: None   Collection Time: 08/01/18  3:38 PM  Result Value Ref Range Status   Specimen Description FLUID PERITONEAL  Final   Special Requests BOTTLES DRAWN AEROBIC AND ANAEROBIC  Final   Culture   Final    NO GROWTH 5 DAYS Performed at [facility], [place] [address] [place], [place] [address]    Report Status 08/06/2018 FINAL  Final  Gram stain     Status: None   Collection Time: 08/01/18  3:38 PM  Result Value Ref Range Status   Specimen Description FLUID PERITONEAL  Final   Special Requests NONE  Final   Gram Stain   Final    ABUNDANT WBC PRESENT, PREDOMINANTLY PMN NO ORGANISMS SEEN Performed at [facility], [place] [address], [place], [place] [address]    Report Status 08/01/2018 FINAL  Final  Surgical pcr screen     Status: None   Collection Time: 08/08/18  2:01 PM  Result Value Ref Range Status   MRSA, PCR NEGATIVE NEGATIVE Final   Staphylococcus aureus NEGATIVE NEGATIVE Final    Comment: (NOTE) The Xpert SA Assay (FDA approved for NASAL specimens in patients 91 years of age and older), is one component of a comprehensive surveillance program. It is not intended to diagnose infection nor to guide or monitor  treatment. Performed at [facility], [place] [address], [place], [place] [address]      Time coordinating discharge: 35 minutes  SIGNED:   Aline August, MD  [facility] 08/10/2018, 12:23 PM Pager: [phone]  If 7PM-7AM, please contact night-coverage [email or web address] Password [email or web address]

## 2018-08-13 DIAGNOSIS — D696 Thrombocytopenia, unspecified: Secondary | ICD-10-CM | POA: Diagnosis not present

## 2018-08-13 DIAGNOSIS — I482 Chronic atrial fibrillation, unspecified: Secondary | ICD-10-CM | POA: Diagnosis not present

## 2018-08-13 DIAGNOSIS — K7031 Alcoholic cirrhosis of liver with ascites: Secondary | ICD-10-CM | POA: Diagnosis not present

## 2018-08-13 DIAGNOSIS — Z0189 Encounter for other specified special examinations: Secondary | ICD-10-CM | POA: Diagnosis not present

## 2018-08-20 ENCOUNTER — Encounter: Payer: Self-pay | Admitting: Family Medicine

## 2018-08-20 ENCOUNTER — Other Ambulatory Visit: Payer: Self-pay

## 2018-08-20 ENCOUNTER — Ambulatory Visit (INDEPENDENT_AMBULATORY_CARE_PROVIDER_SITE_OTHER): Payer: Medicare Other | Admitting: Family Medicine

## 2018-08-20 VITALS — BP 120/68 | HR 76 | Temp 98.1°F | Resp 14 | Ht 69.0 in | Wt 170.0 lb

## 2018-08-20 DIAGNOSIS — Z09 Encounter for follow-up examination after completed treatment for conditions other than malignant neoplasm: Secondary | ICD-10-CM

## 2018-08-20 DIAGNOSIS — K7031 Alcoholic cirrhosis of liver with ascites: Secondary | ICD-10-CM | POA: Diagnosis not present

## 2018-08-20 DIAGNOSIS — Z23 Encounter for immunization: Secondary | ICD-10-CM

## 2018-08-20 DIAGNOSIS — K7030 Alcoholic cirrhosis of liver without ascites: Secondary | ICD-10-CM

## 2018-08-20 DIAGNOSIS — J189 Pneumonia, unspecified organism: Secondary | ICD-10-CM | POA: Diagnosis not present

## 2018-08-20 DIAGNOSIS — A4151 Sepsis due to Escherichia coli [E. coli]: Secondary | ICD-10-CM | POA: Diagnosis not present

## 2018-08-20 DIAGNOSIS — E441 Mild protein-calorie malnutrition: Secondary | ICD-10-CM | POA: Diagnosis not present

## 2018-08-20 LAB — CBC WITH DIFFERENTIAL/PLATELET
BASOS PCT: 0.5 %
Basophils Absolute: 62 cells/uL (ref 0–200)
EOS PCT: 1.5 %
Eosinophils Absolute: 186 cells/uL (ref 15–500)
HEMATOCRIT: 44.4 % (ref 38.5–50.0)
Hemoglobin: 15.1 g/dL (ref 13.2–17.1)
LYMPHS ABS: 2058 {cells}/uL (ref 850–3900)
MCH: 30.6 pg (ref 27.0–33.0)
MCHC: 34 g/dL (ref 32.0–36.0)
MCV: 90.1 fL (ref 80.0–100.0)
MPV: 9.9 fL (ref 7.5–12.5)
Monocytes Relative: 7.1 %
NEUTROS PCT: 74.3 %
Neutro Abs: 9213 cells/uL — ABNORMAL HIGH (ref 1500–7800)
PLATELETS: 286 10*3/uL (ref 140–400)
RBC: 4.93 10*6/uL (ref 4.20–5.80)
RDW: 14.1 % (ref 11.0–15.0)
TOTAL LYMPHOCYTE: 16.6 %
WBC mixed population: 880 cells/uL (ref 200–950)
WBC: 12.4 10*3/uL — AB (ref 3.8–10.8)

## 2018-08-20 LAB — COMPLETE METABOLIC PANEL WITH GFR
AG Ratio: 1.2 (calc) (ref 1.0–2.5)
ALBUMIN MSPROF: 3.6 g/dL (ref 3.6–5.1)
ALKALINE PHOSPHATASE (APISO): 157 U/L — AB (ref 40–115)
ALT: 22 U/L (ref 9–46)
AST: 26 U/L (ref 10–35)
BILIRUBIN TOTAL: 1 mg/dL (ref 0.2–1.2)
BUN: 11 mg/dL (ref 7–25)
CHLORIDE: 101 mmol/L (ref 98–110)
CO2: 25 mmol/L (ref 20–32)
CREATININE: 1.08 mg/dL (ref 0.70–1.18)
Calcium: 9.2 mg/dL (ref 8.6–10.3)
GFR, Est African American: 75 mL/min/{1.73_m2} (ref >=60)
GFR, Est Non African American: 65 mL/min/{1.73_m2} (ref >=60)
GLUCOSE: 100 mg/dL — AB (ref 65–99)
Globulin: 2.9 g/dL (calc) (ref 1.9–3.7)
Potassium: 4.6 mmol/L (ref 3.5–5.3)
Sodium: 138 mmol/L (ref 135–146)
Total Protein: 6.5 g/dL (ref 6.1–8.1)

## 2018-08-20 NOTE — Progress Notes (Signed)
Patient ID: Samuel Flores, male    DOB: 1941-01-21, 76 y.o.   MRN: [id number]  PCP: Alycia Rossetti, MD  Chief Complaint  Patient presents with  . Hospital F/U    S/P laparoscopic cholecystectomy 08/08/2018, liver cirrhoisis/ ascities, PNA, Leukocytosis- needs repeat CBC/CMP    Subjective:   Samuel Flores is a 77 y.o. male, presents to clinic with CC of hospital followup after being admitted for hypotension, liver failure, had ascites with spontaneous bacterial peritonitis, was septic with E. coli, also thought to have pneumonia.  Subjective Here for hospital follow up.     Admit date: 07/29/18 Discharge date: 08/10/18 Diagnoses in the ER with hypotension, sepsis, pneumonia, alcoholic cirrhosis of liver with ascites to A. fib with RVR and acute respiratory failure with hypoxia, he also had acute cholecystitis and had lap chole done during hospitalization.  Sepsis was found to be secondary to E. coli bacteremia He also had paracentesis done which was significant for SBP.  Have a few hypotensive episodes so some of the diuretics were held and spironolactone was resumed outpatient, this is already been followed up on with cardiology.  As was his persistent A. fib with RVR that is rate controlled He had acute kidney injury which resolved prior to discharge, thrombocytopenia which also resolved prior to discharge and hypokalemia resolved.  Recommended f/up cbc/bmp Has seen cardiologist who has adjusted his diuretic medications and given him refills, manage his heart failure Going to f/up with surgery next week for post-op f/up s/p cholecystectomy  Needs GI f/up for liver failure He was septic ascites, SBP with e.coli, finished IV abx and augmentin at discharge  He feels like his weight is good since hospitalizations.   No abd distention, LE edema, shortness of breath, sudden weight gain or weight loss.  Wt Readings from Last 5 Encounters:  08/20/18 170 lb (77.1 kg)  08/10/18  174 lb 1.6 oz (79 kg)  03/25/18 167 lb (75.8 kg)  11/23/17 180 lb (81.6 kg)  10/12/17 185 lb (83.9 kg)   He drinks Ensure, did not have while in hospital, but started when he got hom. He denies ETOH No smoking Nasal congestion is bothersome but he denies cough, chest pain, shortness of breath.   Discharge Summary 08/10/18: Recommendations for Outpatient Follow-up:  1. Follow up with PCP in 1 week with repeat CBC/BMP 2. Follow-up with Dr. [name] as an outpatient 3. Follow-up with general surgery.  Wound care as per general surgery recommendations 4. Patient would benefit from outpatient evaluation and follow-up by GI 5. Follow-up in the ED if symptoms worsen or new appear   Patient Active Problem List   Diagnosis Date Noted  . Alcoholic cirrhosis of liver with ascites ([place]) 08/01/2018  . Atrial fibrillation with RVR ([place]) 08/01/2018  . Acute respiratory failure with hypoxia ([place]) 08/01/2018  . Pneumonia due to infectious organism   . Protein-calorie malnutrition ([place]) 11/23/2017  . CAP (community acquired pneumonia) 10/12/2017  . Cirrhosis of liver ([place]) 10/12/2017  . Pressure injury of skin 10/06/2017  . Lactic acidosis 09/26/2017  . Sepsis ([place]) 09/26/2017  . Epigastric pain 09/26/2017  . A-fib ([place]) 02/18/2017  . Chronic diarrhea 11/27/2014  . Elevated LFTs 07/27/2014  . CHF with left ventricular diastolic dysfunction, NYHA class 2 ([place]) 03/24/2014  . Hypokalemia 12/24/2013  . Peripheral edema 10/07/2013  . Ventricular bigeminy seen on cardiac monitor 07/29/13 at CPST 08/27/2013  . Alcohol abuse, daily use 08/18/2013  . HBP (high blood pressure)  07/15/2013  . Pulmonary hypertension ([place]) 05/28/2013  . DOE (dyspnea on exertion) 04/28/2013  . Hypotension 04/28/2013  . Dizziness and giddiness 04/28/2013  . Hypercholesteremia   . Cancer ([place])   . Gout   . Hx of subdural hematoma      Prior to Admission medications   Medication Sig Start Date End Date Taking?  Authorizing Provider  allopurinol (ZYLOPRIM) 100 MG tablet TAKE 2 TABLETS BY MOUTH EVERY DAY 03/25/18  Yes Tonsina, Modena Nunnery, MD  diltiazem (CARDIZEM CD) 180 MG 24 hr capsule Take 180 mg by mouth daily. 05/15/18  Yes [provider]  fluticasone (FLONASE) 50 MCG/ACT nasal spray Place 2 sprays into both nostrils daily. Patient taking differently: Place 2 sprays into both nostrils daily as needed for allergies.  10/07/17  Yes Patrecia Pour, Christean Grief, MD  folic acid (FOLVITE) 1 MG tablet Take 1 tablet (1 mg total) by mouth daily. 10/07/17  Yes Patrecia Pour, Christean Grief, MD  metoprolol tartrate (LOPRESSOR) 25 MG tablet Take 1 tablet (25 mg total) by mouth 2 (two) times daily. 08/10/18  Yes Aline August, MD  spironolactone (ALDACTONE) 50 MG tablet Take 50 mg by mouth daily.   Yes [provider]  torsemide (DEMADEX) 20 MG tablet Take 10 mg by mouth daily.     [provider]     No Known Allergies   Family History  Problem Relation Age of Onset  . Lung cancer Mother        never smoker  . Prostate cancer Father      Social History   Socioeconomic History  . Marital status: Married    Spouse name: Not on file  . Number of children: Not on file  . Years of education: Not on file  . Highest education level: Not on file  Occupational History  . Occupation: Previous work at Sales promotion account executive: RETIRED  Social Needs  . Financial resource strain: Not on file  . Food insecurity:    Worry: Not on file    Inability: Not on file  . Transportation needs:    Medical: Not on file    Non-medical: Not on file  Tobacco Use  . Smoking status: Former Smoker    Years: 39.00    Types: Pipe    Last attempt to quit: 04/09/2013    Years since quitting: 5.3  . Smokeless tobacco: Never Used  Substance and Sexual Activity  . Alcohol use: Yes    Alcohol/week: 56.0 standard drinks    Types: 14 Cans of beer, 42 Shots of liquor per week  . Drug use: No  . Sexual activity: Never    Lifestyle  . Physical activity:    Days per week: Not on file    Minutes per session: Not on file  . Stress: Not on file  Relationships  . Social connections:    Talks on phone: Not on file    Gets together: Not on file    Attends religious service: Not on file    Active member of club or organization: Not on file    Attends meetings of clubs or organizations: Not on file    Relationship status: Not on file  . Intimate partner violence:    Fear of current or ex partner: Not on file    Emotionally abused: Not on file    Physically abused: Not on file    Forced sexual activity: Not on file  Other Topics Concern  . Not on  file  Social History Narrative  . Not on file     Review of Systems  Constitutional: Negative.   HENT: Positive for congestion and sinus pressure. Negative for nosebleeds, postnasal drip, rhinorrhea, sinus pain, sore throat, tinnitus and voice change.   Eyes: Negative.   Respiratory: Negative.  Negative for cough, chest tightness, shortness of breath and wheezing.   Cardiovascular: Negative.  Negative for chest pain, palpitations and leg swelling.  Gastrointestinal: Negative.  Negative for abdominal distention and abdominal pain.  Endocrine: Negative.   Genitourinary: Negative.   Musculoskeletal: Negative.   Skin: Negative.   Allergic/Immunologic: Negative.   Neurological: Negative.   Hematological: Negative.   Psychiatric/Behavioral: Negative.   All other systems reviewed and are negative.      Objective:    Vitals:   08/20/18 0941  BP: 120/68  Pulse: 76  Resp: 14  Temp: 98.1 F (36.7 C)  TempSrc: Oral  SpO2: 100%  Weight: 170 lb (77.1 kg)  Height: 5\' 9"  (1.753 m)      Physical Exam  Constitutional: He appears well-developed and well-nourished. No distress.  Elderly male, appears thin, nontoxic appearing, alert no acute distress  HENT:  Head: Normocephalic and atraumatic.  Right Ear: External ear normal.  Left Ear: External ear  normal.  Mouth/Throat: Oropharynx is clear and moist. No oropharyngeal exudate.  Some nasal congestion and constant sniffing but scant discharge on exam  Eyes: Pupils are equal, round, and reactive to light. Conjunctivae are normal. Right eye exhibits no discharge. Left eye exhibits no discharge. No scleral icterus.  Neck: Normal range of motion. Neck supple. No tracheal deviation present.  Cardiovascular: Normal rate, regular rhythm, normal heart sounds and intact distal pulses. Exam reveals no gallop and no friction rub.  No murmur heard. No lower extremity edema  Pulmonary/Chest: Effort normal and breath sounds normal. No stridor. No respiratory distress. He has no wheezes. He has no rales. He exhibits no tenderness.  Abdominal: Soft. Bowel sounds are normal. He exhibits no distension and no mass. There is no tenderness. There is no rebound and no guarding.  Abdomen soft, nondistended, incisions well-appearing, clean, dry, intact  Musculoskeletal: Normal range of motion.  Neurological: He is alert. He exhibits normal muscle tone. Coordination normal.  Skin: Skin is warm and dry. Capillary refill takes less than 2 seconds. No rash noted. He is not diaphoretic. No erythema.  Psychiatric: He has a normal mood and affect. His behavior is normal.  Nursing note and vitals reviewed.         Assessment & Plan:      ICD-10-CM   1. Sepsis due to Escherichia coli, unspecified whether acute organ dysfunction present ([place]) R15.40 COMPLETE METABOLIC PANEL WITH GFR    CBC with Differential  2. Encounter for immunization Z23 Flu vaccine HIGH DOSE PF  3. Alcoholic cirrhosis, unspecified whether ascites present (HCC) K70.30 spironolactone (ALDACTONE) 50 MG tablet    COMPLETE METABOLIC PANEL WITH GFR  4. Community acquired pneumonia, unspecified laterality G86.7 COMPLETE METABOLIC PANEL WITH GFR    CBC with Differential  5. Mild protein-calorie malnutrition (HCC) Y19.5 COMPLETE METABOLIC PANEL WITH  GFR    CBC with Differential  6. Encounter for examination following treatment at hospital Z09     Patient is a elderly male, 77 years old, presents for follow-up after hospitalization for sepsis, pneumonia, CHF, liver failure.  Repeat CBC and CMP done today as recommended on discharge summary.  Patient's vital signs stable within normal limits, he is afebrile,  he appears mildly hypovolemic, has no signs of ascites or CHF, he is alert.  He is going to arrange his GI follow-up and is going to his cardiology follow-up as well.  He endorses some mild nasal congestion but otherwise has no complaints, completed all his medications.   Does seem very resistant to some of these follow-up appointments labs her doctor visits and because of his severe recent illness I have asked him to follow-up in 1 month to recheck his weights and ensure that he is doing well.   Delsa Grana, PA-C 08/20/18 10:00 AM

## 2018-08-20 NOTE — Patient Instructions (Addendum)
GI doctors will call you  -   If you don't hear from them reach out in a week or so.  [facility]  Dr. Alessandra Bevels   F/up with [place] in one month to recheck lungs and other meds, weight, nutrition  [facility]  [address] [place]  [place], [place] [address]  Call: [phone]

## 2018-08-27 DIAGNOSIS — I482 Chronic atrial fibrillation, unspecified: Secondary | ICD-10-CM | POA: Diagnosis not present

## 2018-09-20 ENCOUNTER — Ambulatory Visit (INDEPENDENT_AMBULATORY_CARE_PROVIDER_SITE_OTHER): Payer: Medicare Other | Admitting: Family Medicine

## 2018-09-20 ENCOUNTER — Other Ambulatory Visit: Payer: Self-pay

## 2018-09-20 ENCOUNTER — Encounter: Payer: Self-pay | Admitting: Family Medicine

## 2018-09-20 VITALS — BP 142/70 | HR 66 | Temp 98.3°F | Resp 14 | Ht 69.0 in | Wt 176.0 lb

## 2018-09-20 DIAGNOSIS — E441 Mild protein-calorie malnutrition: Secondary | ICD-10-CM

## 2018-09-20 DIAGNOSIS — K7030 Alcoholic cirrhosis of liver without ascites: Secondary | ICD-10-CM

## 2018-09-20 NOTE — Progress Notes (Signed)
Patient ID: Samuel Flores, male    DOB: Aug 17, 1941, 76 y.o.   MRN: [id number]  PCP: Alycia Rossetti, MD  Chief Complaint  Patient presents with  . Follow-up    Subjective:   Samuel Flores is a 77 y.o. male, presents to clinic with CC of one month follow up for recheck of protein calorie malnutrition, status post cholecystectomy, status post hospitalization for spontaneous bacterial peritonitis, also had pneumonia, and has cirrhosis.   I saw him one month ago for hospital follow-up.  He had follow-up with general surgery pending and was to follow-up with gastroenterology, I requested that he follow-up back in our clinic in 1 month to recheck his lungs his weight, nutrition and generally recheck him because of how complicated he is and had a very eventful hospitalization for sepsis with subsequent surgery.  He states that he is very pleased with his health he is feeling really well.  He continues to supplement at least once daily with Ensure but he states he "does not feel any different."  He is not having any breathing difficulty, he denies any abdominal distention, firmness, pain.  He believes he is up a few pounds but no large weight fluctuation.  He denies having any fever, chills, sweats, nausea, vomiting, chest pain, lower extremity edema, cough.  He did have severe nasal symptoms that he complained of last month but these have resolved and he states that he do tend to get worse again when he goes outside.   abd soft  Cleared by surgeon  No fever, chills, sweats, change in energy or appetite  Wt Readings from Last 5 Encounters:  09/20/18 176 lb (79.8 kg)  08/20/18 170 lb (77.1 kg)  08/10/18 174 lb 1.6 oz (79 kg)  03/25/18 167 lb (75.8 kg)  11/23/17 180 lb (81.6 kg)      Patient Active Problem List   Diagnosis Date Noted  . Alcoholic cirrhosis of liver with ascites ([place]) 08/01/2018  . Atrial fibrillation with RVR ([place]) 08/01/2018  . Acute respiratory failure with  hypoxia ([place]) 08/01/2018  . Pneumonia due to infectious organism   . Protein-calorie malnutrition ([place]) 11/23/2017  . CAP (community acquired pneumonia) 10/12/2017  . Cirrhosis of liver () 10/12/2017  . Pressure injury of skin 10/06/2017  . Lactic acidosis 09/26/2017  . Sepsis ([place]) 09/26/2017  . Epigastric pain 09/26/2017  . A-fib ([place]) 02/18/2017  . Chronic diarrhea 11/27/2014  . Elevated LFTs 07/27/2014  . CHF with left ventricular diastolic dysfunction, NYHA class 2 ([place]) 03/24/2014  . Hypokalemia 12/24/2013  . Peripheral edema 10/07/2013  . Ventricular bigeminy seen on cardiac monitor 07/29/13 at CPST 08/27/2013  . Alcohol abuse, daily use 08/18/2013  . HBP (high blood pressure) 07/15/2013  . Pulmonary hypertension ([place]) 05/28/2013  . DOE (dyspnea on exertion) 04/28/2013  . Hypotension 04/28/2013  . Dizziness and giddiness 04/28/2013  . Hypercholesteremia   . Cancer ([place])   . Gout   . Hx of subdural hematoma     Current Meds  Medication Sig  . allopurinol (ZYLOPRIM) 100 MG tablet TAKE 2 TABLETS BY MOUTH EVERY DAY  . diltiazem (CARDIZEM CD) 180 MG 24 hr capsule Take 180 mg by mouth daily.  . fluticasone (FLONASE) 50 MCG/ACT nasal spray Place 2 sprays into both nostrils daily. (Patient taking differently: Place 2 sprays into both nostrils daily as needed for allergies. )  . folic acid (FOLVITE) 1 MG tablet Take 1 tablet (1 mg total) by mouth daily.  Marland Kitchen  metoprolol tartrate (LOPRESSOR) 25 MG tablet Take 1 tablet (25 mg total) by mouth 2 (two) times daily.  Marland Kitchen spironolactone (ALDACTONE) 50 MG tablet Take 50 mg by mouth daily.     Review of Systems  Constitutional: Negative.   HENT: Negative.   Eyes: Negative.   Respiratory: Negative.   Cardiovascular: Negative.   Gastrointestinal: Negative.   Endocrine: Negative.   Genitourinary: Negative.   Musculoskeletal: Negative.   Skin: Negative.   Allergic/Immunologic: Negative.   Neurological: Negative.   Hematological:  Negative.   Psychiatric/Behavioral: Negative.   All other systems reviewed and are negative.      Objective:    Vitals:   09/20/18 1436  BP: (!) 142/70  Pulse: 66  Resp: 14  Temp: 98.3 F (36.8 C)  TempSrc: Oral  SpO2: 100%  Weight: 176 lb (79.8 kg)  Height: 5\' 9"  (1.753 m)      Physical Exam  Constitutional: He appears well-developed and well-nourished. No distress.  Elderly male, appears thin, nontoxic appearing, alert no acute distress  HENT:  Head: Normocephalic and atraumatic.  Right Ear: External ear normal.  Left Ear: External ear normal.  Nose: Nose normal.  Mouth/Throat: Oropharynx is clear and moist. No oropharyngeal exudate.  Eyes: Pupils are equal, round, and reactive to light. Conjunctivae are normal. Right eye exhibits no discharge. Left eye exhibits no discharge. No scleral icterus.  Neck: Normal range of motion. Neck supple. No JVD present. No tracheal deviation present.  Cardiovascular: Normal rate, regular rhythm, normal heart sounds and intact distal pulses. Exam reveals no gallop and no friction rub.  No murmur heard. No lower extremity edema  Pulmonary/Chest: Effort normal and breath sounds normal. No stridor. No respiratory distress. He has no wheezes. He has no rales. He exhibits no tenderness.  Abdominal: Soft. Bowel sounds are normal. He exhibits no distension and no mass. There is no tenderness. There is no rebound and no guarding.  Abdomen soft, nondistended  Musculoskeletal: Normal range of motion.  Neurological: He is alert. He exhibits normal muscle tone. Coordination normal.  Skin: Skin is warm and dry. Capillary refill takes less than 2 seconds. No rash noted. He is not diaphoretic. No erythema.  Psychiatric: He has a normal mood and affect. His behavior is normal. Judgment and thought content normal.  Nursing note and vitals reviewed.         Assessment & Plan:   Problem List Items Addressed This Visit      Digestive   Cirrhosis  of liver ([place]) - Primary     Other   Protein-calorie malnutrition ([place])      77 year old male here for 1 month recheck of protein calorie malnutrition following a prolonged hospitalization with anterior knee is bacterial peritonitis secondary to liver failure, he was septic also had a gallbladder removed.  He is very hesitant to come to the doctor's site was encouraging him to come for 1 more time for close follow-up just to watch his weights and reassess his fluid status with physical exam.  He reports feeling very well having recovered from his surgery, he is continuing to supplement his nutrition, weights are up just slightly since his last office visit following the hospitalization.  Abdomen is soft he has no lower extremity edema.  He is following up with GI and surgery.  Does not any medicines refilled any lab work today.  He seems to be doing well can return for his routine care with his PCP    Delsa Grana, PA-C  09/20/18 3:05 PM

## 2018-10-25 IMAGING — MR MR HEAD W/O CM
10 of 11 series · 37 of 48 positions shown · non-contrast
Comparison: Head CT from yesterday

CLINICAL DATA: Altered level of consciousness. Low-density in the
left pons by CT.

EXAM:
MRI HEAD WITHOUT CONTRAST
TECHNIQUE: Multiplanar, multiecho pulse sequences of the brain and surrounding
structures were obtained without intravenous contrast.

[Series 3: DWI · axial · 3.0mm · 0.94mm/px · z∈[-129,+41]mm · 8 of 116 slices shown (1 of 2)]
[im 1/116]
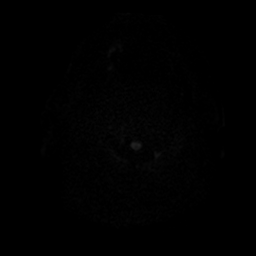
[im 13/116]
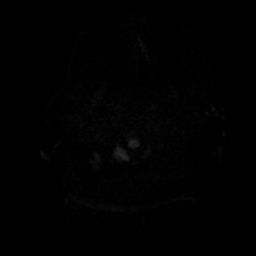
[im 39/116]
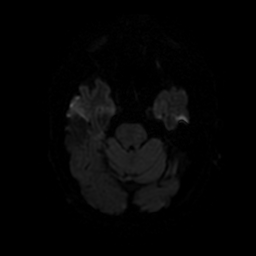
[im 52/116]
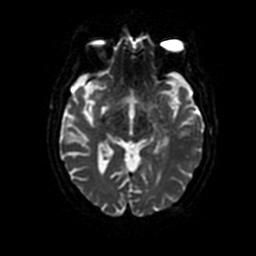
[im 64/116]
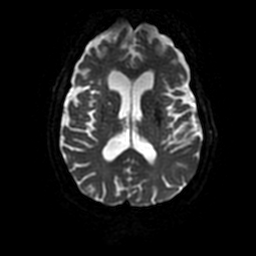
[im 77/116]
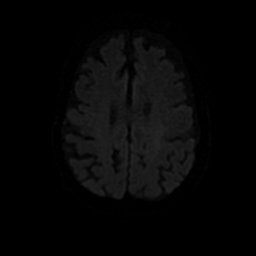
[im 103/116]
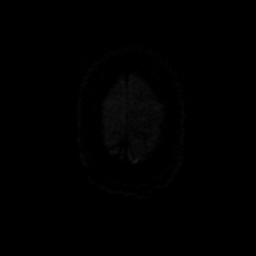
[im 116/116]
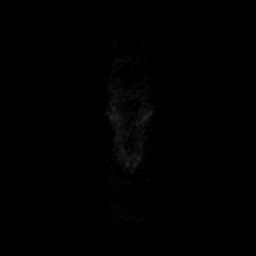

[Series 4: DWI · coronal · 4.0mm · 0.94mm/px · 6 of 72 slices shown (2 of 2)]
[im 1/72]
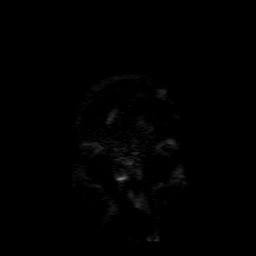
[im 15/72]
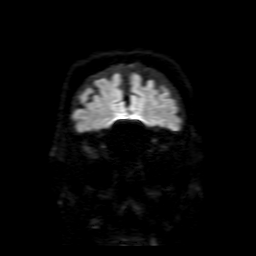
[im 29/72]
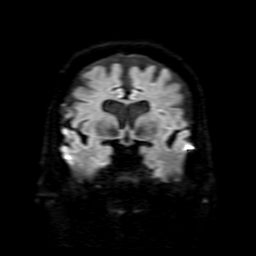
[im 43/72]
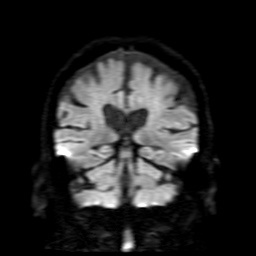
[im 57/72]
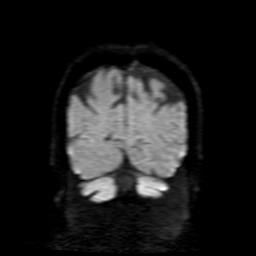
[im 72/72]
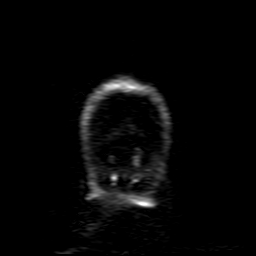

[Series 5: FLAIR · sagittal · 5.0mm · 0.47mm/px · 2 of 25 slices shown (1 of 2)]
[im 1/25]
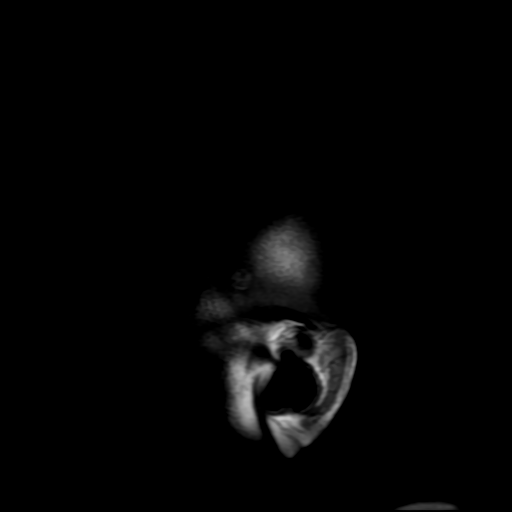
[im 25/25]
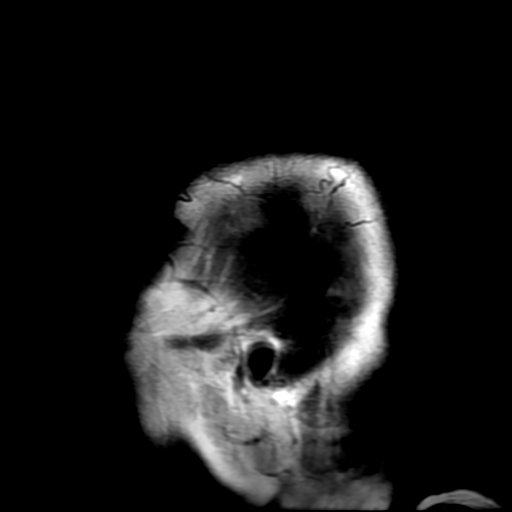

[Series 7: T2 · axial · 5.0mm · 0.47mm/px · z∈[-129,+33]mm · 2 of 28 slices shown (1 of 2)]
[im 1/28]
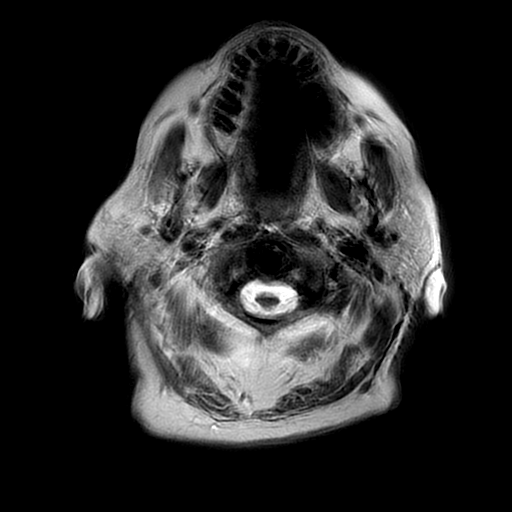
[im 28/28]
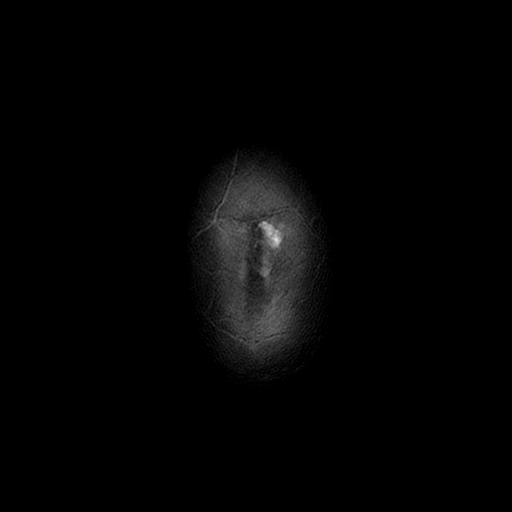

[Series 8: FLAIR · axial · 5.0mm · 0.47mm/px · z∈[-129,+33]mm · 2 of 28 slices shown (2 of 2)]
[im 1/28]
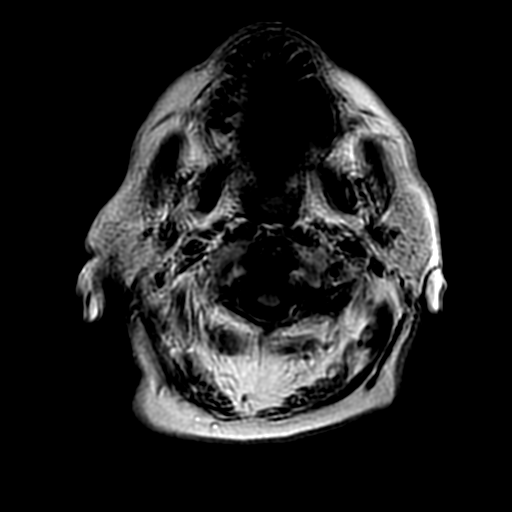
[im 28/28]
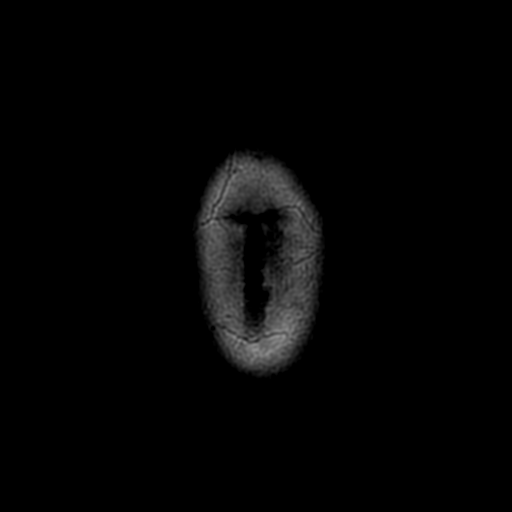

[Series 9: (person_name) · axial · 3.0mm · 0.47mm/px · z∈[-130,-27]mm · 5 of 112 slices shown]
[im 1/112]
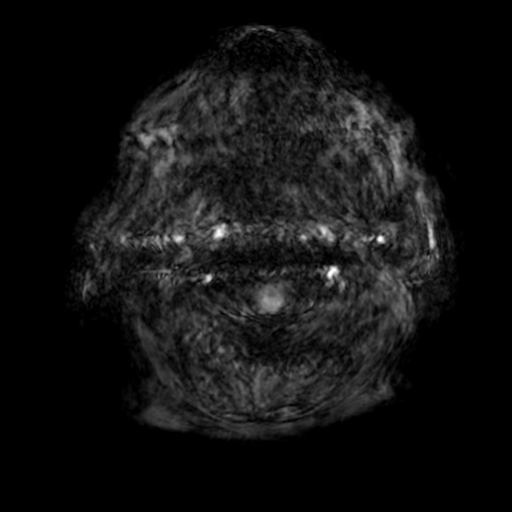
[im 14/112]
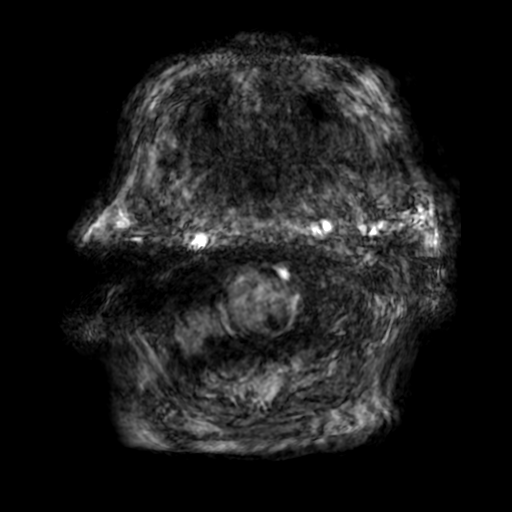
[im 28/112]
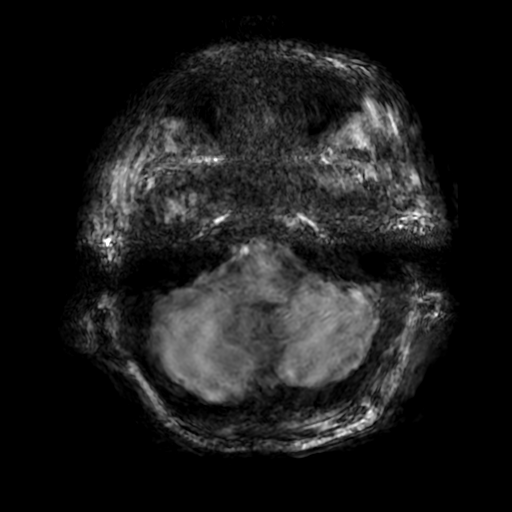
[im 42/112]
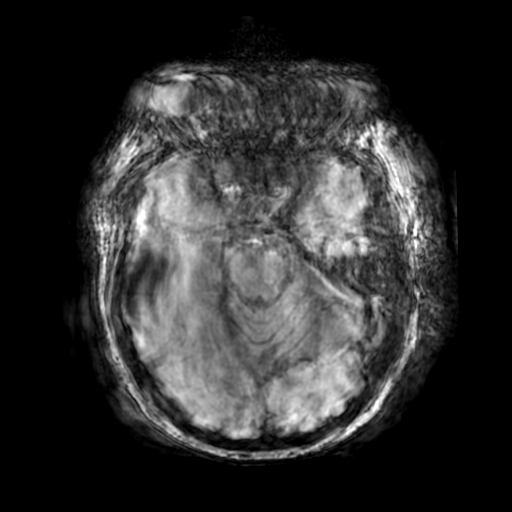
[im 70/112]
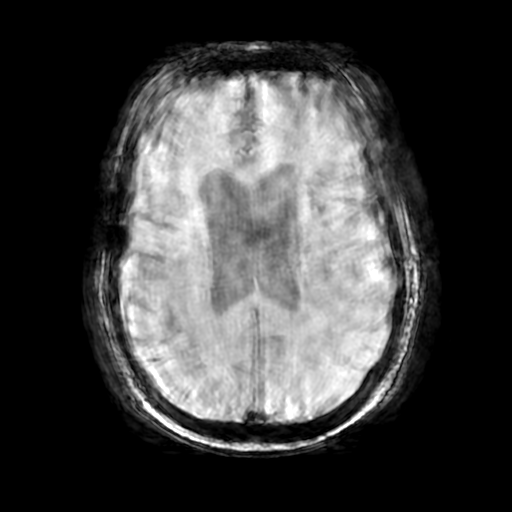

[Series 11: T2 · coronal · 5.0mm · 0.94mm/px · 2 of 30 slices shown (2 of 2)]
[im 1/30]
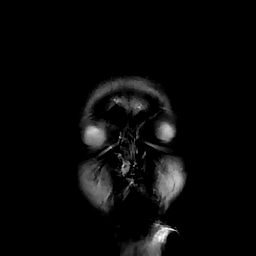
[im 30/30]
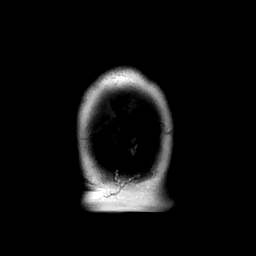

[Series 12: GRE · axial · 5.0mm · 0.94mm/px · z∈[-129,+33]mm · 2 of 28 slices shown]
[im 1/28]
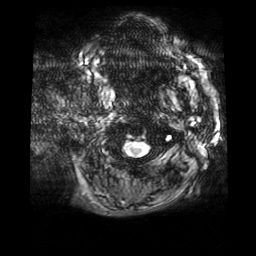
[im 28/28]
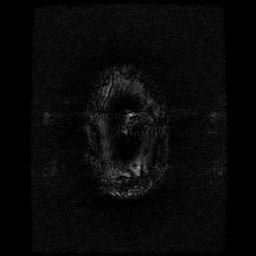

[Series 350: ADC · axial · 3.0mm · 0.94mm/px · z∈[-129,+41]mm · 5 of 58 slices shown (1 of 2)]
[im 1/58]
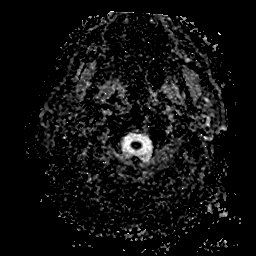
[im 15/58]
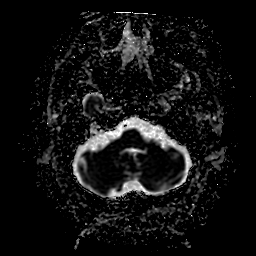
[im 29/58]
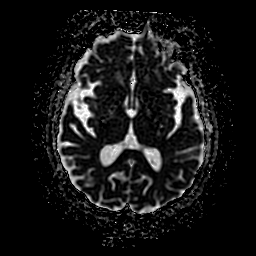
[im 43/58]
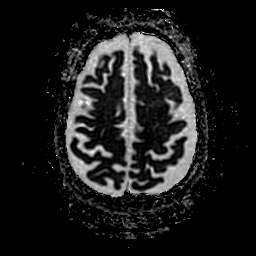
[im 58/58]
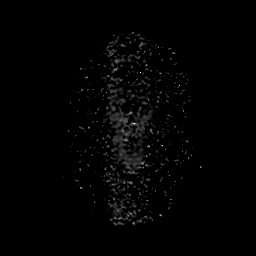

[Series 450: ADC · coronal · 4.0mm · 0.94mm/px · 3 of 36 slices shown (2 of 2)]
[im 1/36]
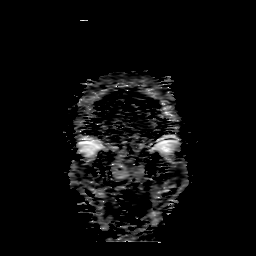
[im 18/36]
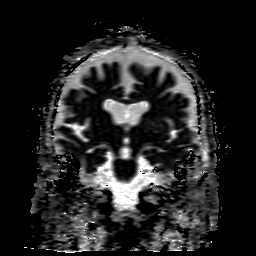
[im 36/36]
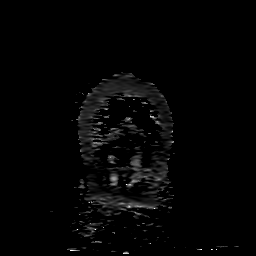

[37 of 48 positions shown; findings below may reference images not displayed]

FINDINGS: Brain: No acute infarction, hemorrhage, hydrocephalus, extra-axial
collection or mass lesion. The left pontine finding a was
artifactual. Mild to moderate for age cerebral atrophy that is
generalized. No notable chronic ischemic injury.

Vascular: Major flow voids are preserved

Skull and upper cervical spine: Negative for marrow lesion. Remote
right sided craniotomy, reportedly for subdural hematoma evacuation.

Sinuses/Orbits: Bilateral cataract resection.

Other: Intermittently motion degraded.
IMPRESSION: 1. No acute finding.  The pontine finding by CT was artifactual.
2. Generalized cerebral atrophy.
3. Motion degraded.

## 2018-10-26 IMAGING — DX DG CHEST 1V PORT
1 series · 1 of 1 positions shown · non-contrast
Comparison: 09/28/2017

CLINICAL DATA: Shortness of Breath

EXAM:
PORTABLE CHEST 1 VIEW

[chest]
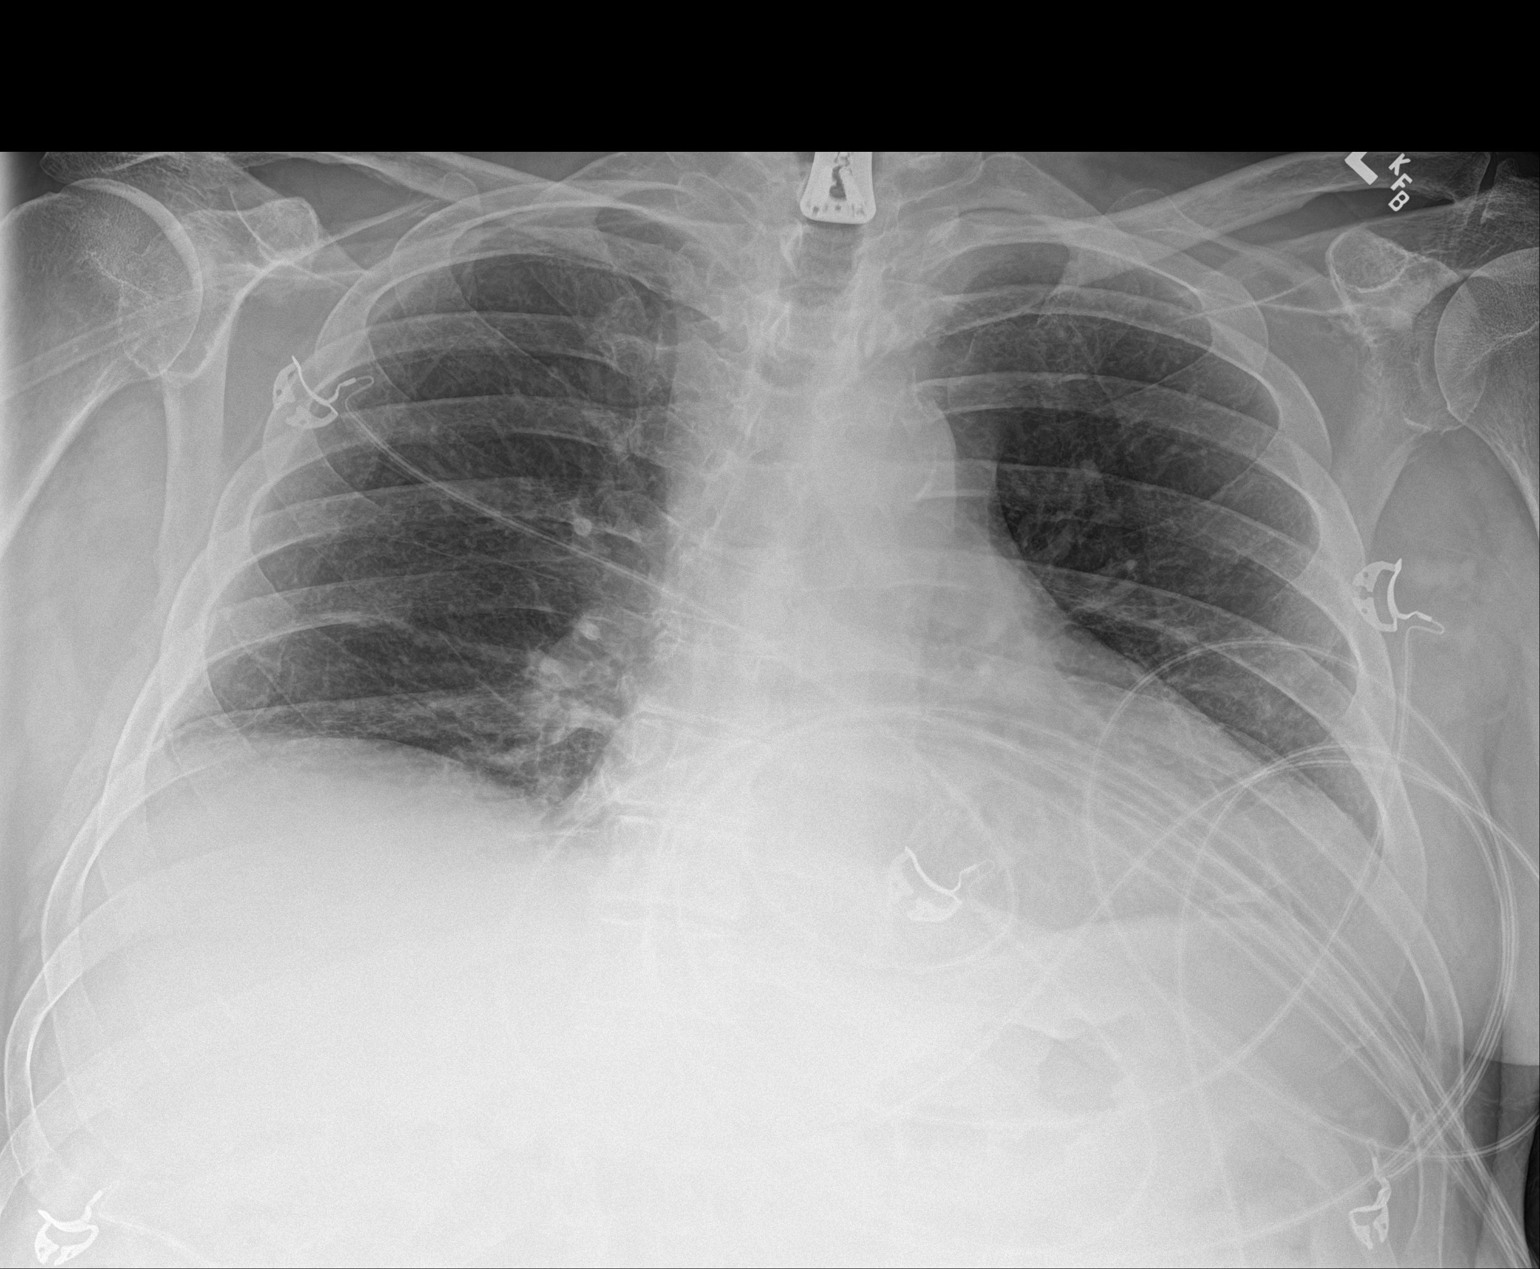

[1 of 1 positions shown; findings below may reference images not displayed]

FINDINGS: Cardiac shadow is mildly enlarged but stable. Lungs are hypoinflated
without focal infiltrate or sizable effusion. Postsurgical changes
in the cervical spine are noted. Previously seen changes in the left
base have cleared in the interval.
IMPRESSION: Significant improved aeration without focal infiltrate at this time.

## 2018-11-21 DIAGNOSIS — C44319 Basal cell carcinoma of skin of other parts of face: Secondary | ICD-10-CM | POA: Diagnosis not present

## 2018-12-29 ENCOUNTER — Encounter: Payer: Self-pay | Admitting: Cardiology

## 2018-12-29 NOTE — Progress Notes (Signed)
Subjective:  Primary Physician:  Alycia Rossetti, MD  Patient ID: Samuel Flores, male    DOB: 1941/07/06, 78 y.o.   MRN: [id number]  Chief Complaint  Patient presents with  . Atrial Fibrillation    6 month F/U    HPI: Samuel Flores  is a 78 y.o. male  with h/o chronic atrial fibrillation and on chronic anticoagulatin since Aug 2018, non obstructive CAD by angiogram in 4132, alcoholic cirrhosis of the liver, COPD, thrombocytopenia, hypertension, prior tobacco use, last admission to the hospital was in September 2019 with acute cholecystitis and underwent laparoscopic cholecystectomy.  This is a six-month office visit.  Since last office visit he is remained abstinent from alcohol, has not been using any tobacco products, has gained weight, dizziness has resolved, he is now independent and is driving on his own.  Denies chest pain, palpitations.  Has mild chronic dyspnea.  No leg edema.  Past Medical History:  Diagnosis Date  . Alcohol abuse, daily use 08/18/2013  . Arthritis    osteoarthritis  . CAD (coronary artery disease)    Non obstructive  . Cancer [facility]) 1994   prostate  . Diastolic dysfunction Echo 2012   Grade 1  . Essential hypertension 07/15/2013  . Gout   . 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 Hx-TIA (transient ischemic attack) 2011  . Hypercholesteremia   . Hypertension     Past Surgical History:  Procedure Laterality Date  . BURR HOLE FOR SUBDURAL HEMATOMA  03/2011  . CARDIOVERSION N/A 02/20/2017   Procedure: CARDIOVERSION;  Surgeon: Adrian Prows, MD;  Location: [place];  Service: Cardiovascular;  Laterality: N/A;  . CHOLECYSTECTOMY N/A 08/08/2018   Procedure: LAPAROSCOPIC CHOLECYSTECTOMY;  Surgeon: Stark Klein, MD;  Location: [facility];  Service: General;  Laterality: N/A;  . EYE SURGERY Left 03/2009   cataract  . LEFT HEART CATHETERIZATION WITH CORONARY ANGIOGRAM N/A 01/02/2013   Procedure: LEFT HEART CATHETERIZATION WITH CORONARY ANGIOGRAM;  Surgeon:  Laverda Page, MD;  Location: [facility] CATH LAB;  Service: Cardiovascular;  Laterality: N/A;  . PROSTATE SURGERY  1994  . SPINE SURGERY     cervical    Social History   Socioeconomic History  . Marital status: Married    Spouse name: Not on file  . Number of children: 2  . Years of education: Not on file  . Highest education level: Not on file  Occupational History  . Occupation: Previous work at Sales promotion account executive: RETIRED  Social Needs  . Financial resource strain: Not on file  . Food insecurity:    Worry: Not on file    Inability: Not on file  . Transportation needs:    Medical: Not on file    Non-medical: Not on file  Tobacco Use  . Smoking status: Former Smoker    Years: 39.00    Types: Pipe    Last attempt to quit: 04/09/2013    Years since quitting: 5.7  . Smokeless tobacco: Never Used  Substance and Sexual Activity  . Alcohol use: Not Currently    Frequency: Never    Comment: Pt has not had alcohol in 15 months   . Drug use: No  . Sexual activity: Never  Lifestyle  . Physical activity:    Days per week: Not on file    Minutes per session: Not on file  . Stress: Not on file  Relationships  . Social connections:    Talks on phone: Not  on file    Gets together: Not on file    Attends religious service: Not on file    Active member of club or organization: Not on file    Attends meetings of clubs or organizations: Not on file    Relationship status: Not on file  . Intimate partner violence:    Fear of current or ex partner: Not on file    Emotionally abused: Not on file    Physically abused: Not on file    Forced sexual activity: Not on file  Other Topics Concern  . Not on file  Social History Narrative  . Not on file    Current Outpatient Medications on File Prior to Visit  Medication Sig Dispense Refill  . allopurinol (ZYLOPRIM) 100 MG tablet TAKE 2 TABLETS BY MOUTH EVERY DAY 180 tablet 2  . diltiazem (CARDIZEM CD) 180 MG 24 hr capsule Take 180  mg by mouth daily.  2  . fluticasone (FLONASE) 50 MCG/ACT nasal spray Place 2 sprays into both nostrils daily. (Patient taking differently: Place 2 sprays into both nostrils daily as needed for allergies. ) 1 g 0  . folic acid (FOLVITE) 1 MG tablet Take 1 tablet (1 mg total) by mouth daily. 30 tablet 0  . metoprolol tartrate (LOPRESSOR) 25 MG tablet Take 1 tablet (25 mg total) by mouth 2 (two) times daily. 60 tablet 0  . spironolactone (ALDACTONE) 50 MG tablet Take 50 mg by mouth daily.     No current facility-administered medications on file prior to visit.      Review of Systems  Constitutional: Negative for weight loss.  Respiratory: Positive for cough and shortness of breath. Negative for hemoptysis.   Cardiovascular: Negative for chest pain, palpitations, claudication and leg swelling.  Gastrointestinal: Negative for abdominal pain, blood in stool, constipation, heartburn and vomiting.  Genitourinary: Negative for dysuria.  Musculoskeletal: Negative for joint pain and myalgias.  Neurological: Negative for dizziness, focal weakness and headaches.  Endo/Heme/Allergies: Does not bruise/bleed easily.  Psychiatric/Behavioral: Negative for depression. The patient is not nervous/anxious.   All other systems reviewed and are negative.      Objective:  Blood pressure 129/74, pulse 90, height 5\' 10"  (1.778 m), weight 187 lb 4.8 oz (85 kg), SpO2 96 %. Body mass index is 26.87 kg/m.   Physical Exam  Constitutional: He appears well-developed and well-nourished. He appears ill. No distress.  HENT:  Head: Atraumatic.  Eyes: Conjunctivae are normal.  Neck: Neck supple. No JVD present. No thyromegaly present.  Cardiovascular: Normal rate, normal heart sounds and intact distal pulses. An irregular rhythm present. Exam reveals no gallop.  No murmur heard. Pulmonary/Chest: Effort normal and breath sounds normal.  Abdominal: Soft. Bowel sounds are normal.  Musculoskeletal: Normal range of  motion.  Neurological: He is alert.  Skin: Skin is warm and dry.  Psychiatric: He has a normal mood and affect.   CARDIAC STUDIES:  Echocardiogram 08/07/2018: Normal LV size, mild LVH, mild decrease in LV systolic function at 98-11% with diffuse hypokinesis. Left atrium was severely dilated. Mild tricuspid regurgitation and mild to moderate ulnarly hypertension.  Assessment & Recommendations:   1. Permanent atrial fibrillation CHA2DS2-VASc Score is 3.  -(CHF; HTN; vasc disease DM,  Male = 1; Age <65 =0; 65-74 = 1,  >75 =2; stroke = 2).  -(Yearly risk of stroke: Score of 1=1.3; 2=2.2; 3=3.2; 4=4; 5=6.7; 6=9.8; 7=>9.8) EKG 12/30/2018:  Atrial fibrillation with rapid ventricle response at the rate of 110 bpm, left  axis deviation, left plantar fascicular block.  Cannot exclude inferior infarct old.  Poor R-wave progression, cannot exclude anterolateral infarct old, ulnar disease pattern.  Single PVC.  2. Alcoholic cirrhosis of liver with ascites ([place]). Today there is no clinical evidence of ascites.  CMP     Component Value Date/Time   NA 138 08/20/2018 1027   K 4.6 08/20/2018 1027   CL 101 08/20/2018 1027   CO2 25 08/20/2018 1027   GLUCOSE 100 (H) 08/20/2018 1027   BUN 11 08/20/2018 1027   CREATININE 1.08 08/20/2018 1027   CALCIUM 9.2 08/20/2018 1027   PROT 6.5 08/20/2018 1027   ALBUMIN 2.6 (L) 08/10/2018 0517   AST 26 08/20/2018 1027   ALT 22 08/20/2018 1027   ALKPHOS 116 08/10/2018 0517   BILITOT 1.0 08/20/2018 1027   GFRNONAA 65 08/20/2018 1027   GFRAA 75 08/20/2018 1027   CBC    Component Value Date/Time   WBC 12.4 (H) 08/20/2018 1027   RBC 4.93 08/20/2018 1027   HGB 15.1 08/20/2018 1027   HCT 44.4 08/20/2018 1027   PLT 286 08/20/2018 1027   MCV 90.1 08/20/2018 1027   MCH 30.6 08/20/2018 1027   MCHC 34.0 08/20/2018 1027   RDW 14.1 08/20/2018 1027   LYMPHSABS 2,058 08/20/2018 1027   MONOABS 0.6 08/10/2018 0517   EOSABS 186 08/20/2018 1027   BASOSABS 62  08/20/2018 1027   Lipid Panel     Component Value Date/Time   CHOL 154 10/24/2016 1004   TRIG 134 10/24/2016 1004   HDL 47 10/24/2016 1004   CHOLHDL 3.3 10/24/2016 1004   VLDL 27 10/24/2016 1004   LDLCALC 80 10/24/2016 1004   Lipid-lowering therapy was not prescribed due to medical contraindication. Cirrhosis.  3. Essential hypertension Blood pressure is now well controlled.  4. DOE (dyspnea on exertion) Due to underlying COPD, fortunately is abstinent from tobacco.  Recommendation: Patient is remained stable, his overall health has improved significantly since last office visit 6 months ago.  He is now back to being independent, has completely quit drinking alcohol.  I again discussed with him regarding chronic atrial fibrillation, his cardio-embolic risk being extremely high would prefer him to be on Coumadin.  Patient does not want to been anticoagulants.  He is aware of the risk associated with this.  Although he has cirrhosis of the liver, there is no variceal bleed, there is no history of prior GI bleed.  I'll see him back in 6 months.  Adrian Prows, MD, [facility] 12/30/2018, 4:52 PM [facility]. [place] Pager: [phone] Office: [phone] If no answer Cell [phone]

## 2018-12-30 ENCOUNTER — Ambulatory Visit (INDEPENDENT_AMBULATORY_CARE_PROVIDER_SITE_OTHER): Payer: Medicare Other | Admitting: Cardiology

## 2018-12-30 ENCOUNTER — Encounter: Payer: Self-pay | Admitting: Cardiology

## 2018-12-30 VITALS — BP 129/74 | HR 90 | Ht 70.0 in | Wt 187.3 lb

## 2018-12-30 DIAGNOSIS — I10 Essential (primary) hypertension: Secondary | ICD-10-CM

## 2018-12-30 DIAGNOSIS — K7030 Alcoholic cirrhosis of liver without ascites: Secondary | ICD-10-CM

## 2018-12-30 DIAGNOSIS — R0609 Other forms of dyspnea: Secondary | ICD-10-CM

## 2018-12-30 DIAGNOSIS — I4821 Permanent atrial fibrillation: Secondary | ICD-10-CM | POA: Diagnosis not present

## 2018-12-31 ENCOUNTER — Other Ambulatory Visit: Payer: Self-pay | Admitting: Family Medicine

## 2019-01-15 DIAGNOSIS — D485 Neoplasm of uncertain behavior of skin: Secondary | ICD-10-CM | POA: Diagnosis not present

## 2019-01-15 DIAGNOSIS — C44319 Basal cell carcinoma of skin of other parts of face: Secondary | ICD-10-CM | POA: Diagnosis not present

## 2019-03-31 ENCOUNTER — Other Ambulatory Visit: Payer: Self-pay

## 2019-03-31 MED ORDER — METOPROLOL TARTRATE 25 MG PO TABS: 25.0000 mg | ORAL_TABLET | Freq: Two times a day (BID) | ORAL | 2 refills | Status: DC

## 2019-04-02 ENCOUNTER — Other Ambulatory Visit: Payer: Self-pay | Admitting: Cardiology

## 2019-04-02 DIAGNOSIS — K7030 Alcoholic cirrhosis of liver without ascites: Secondary | ICD-10-CM

## 2019-04-02 MED ORDER — SPIRONOLACTONE 50 MG PO TABS: 50.0000 mg | ORAL_TABLET | Freq: Every day | ORAL | 3 refills | Status: DC

## 2019-06-11 ENCOUNTER — Other Ambulatory Visit: Payer: Self-pay

## 2019-06-11 MED ORDER — DILTIAZEM HCL ER COATED BEADS 180 MG PO CP24: 180.0000 mg | ORAL_CAPSULE | Freq: Every day | ORAL | 1 refills | Status: DC

## 2019-06-17 DIAGNOSIS — C44319 Basal cell carcinoma of skin of other parts of face: Secondary | ICD-10-CM | POA: Diagnosis not present

## 2019-06-27 ENCOUNTER — Encounter: Payer: Self-pay | Admitting: Cardiology

## 2019-06-30 ENCOUNTER — Other Ambulatory Visit: Payer: Self-pay

## 2019-06-30 ENCOUNTER — Encounter: Payer: Self-pay | Admitting: Cardiology

## 2019-06-30 ENCOUNTER — Ambulatory Visit (INDEPENDENT_AMBULATORY_CARE_PROVIDER_SITE_OTHER): Payer: Medicare Other | Admitting: Cardiology

## 2019-06-30 VITALS — BP 129/73 | HR 72 | Temp 98.4°F | Ht 70.0 in | Wt 190.6 lb

## 2019-06-30 DIAGNOSIS — R0602 Shortness of breath: Secondary | ICD-10-CM | POA: Diagnosis not present

## 2019-06-30 DIAGNOSIS — I4821 Permanent atrial fibrillation: Secondary | ICD-10-CM | POA: Diagnosis not present

## 2019-06-30 DIAGNOSIS — I10 Essential (primary) hypertension: Secondary | ICD-10-CM | POA: Diagnosis not present

## 2019-06-30 DIAGNOSIS — K7030 Alcoholic cirrhosis of liver without ascites: Secondary | ICD-10-CM

## 2019-06-30 DIAGNOSIS — I25118 Atherosclerotic heart disease of native coronary artery with other forms of angina pectoris: Secondary | ICD-10-CM

## 2019-06-30 NOTE — Progress Notes (Signed)
Primary Physician/Referring:  Alycia Rossetti, MD  Patient ID: Samuel Flores, male    DOB: 10-07-1941, 78 y.o.   MRN: [id number]  No chief complaint on file.  HPI:    Samuel Flores  is a 78 y.o.  Caucasian male with alcoholic  cirrhosis of the liver with ascites and COPD and chronic hypoxemia.  His past medical history significant for moderate to severe diffuse calcific coronary artery disease by angiography in 2014, pulmonary atrial fibrillation, hypertension, chronic thrombocytopenia, COPD from prior tobacco use.    Except for worsening dyspnea, he has no other specific symptoms today, he has been gradually gaining weight and states that he is feeling the best he has in a while except for dyspnea.  No PND or orthopnea, he has not had any leg edema.  Past Medical History:  Diagnosis Date  . Alcohol abuse, daily use 08/18/2013  . Alcoholic cirrhosis of liver with ascites ([place]) 08/01/2018  . Arthritis    osteoarthritis  . CAD (coronary artery disease)    Non obstructive  . Cancer [facility]) 1994   prostate  . Diastolic dysfunction Echo 2012   Grade 1  . Essential hypertension 07/15/2013  . Gout   . 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 Hx-TIA (transient ischemic attack) 2011  . Hypercholesteremia   . Hypertension    Past Surgical History:  Procedure Laterality Date  . BURR HOLE FOR SUBDURAL HEMATOMA  03/2011  . CARDIOVERSION N/A 02/20/2017   Procedure: CARDIOVERSION;  Surgeon: Adrian Prows, MD;  Location: [place];  Service: Cardiovascular;  Laterality: N/A;  . CHOLECYSTECTOMY N/A 08/08/2018   Procedure: LAPAROSCOPIC CHOLECYSTECTOMY;  Surgeon: Stark Klein, MD;  Location[facility];  Service: General;  Laterality: N/A;  . EYE SURGERY Left 03/2009   cataract  . LEFT HEART CATHETERIZATION WITH CORONARY ANGIOGRAM N/A 01/02/2013   Procedure: LEFT HEART CATHETERIZATION WITH CORONARY ANGIOGRAM;  Surgeon: Laverda Page, MD;  Location: [facility] CATH LAB;  Service: Cardiovascular;  Laterality:  N/A;  . PROSTATE SURGERY  1994  . SPINE SURGERY     cervical   Social History   Socioeconomic History  . Marital status: Married    Spouse name: Not on file  . Number of children: 2  . Years of education: Not on file  . Highest education level: Not on file  Occupational History  . Occupation: Previous work at Sales promotion account executive: RETIRED  Social Needs  . Financial resource strain: Not on file  . Food insecurity    Worry: Not on file    Inability: Not on file  . Transportation needs    Medical: Not on file    Non-medical: Not on file  Tobacco Use  . Smoking status: Former Smoker    Years: 39.00    Types: Pipe    Quit date: 04/09/2013    Years since quitting: 6.2  . Smokeless tobacco: Never Used  Substance and Sexual Activity  . Alcohol use: Not Currently    Frequency: Never    Comment: Pt has not had alcohol in 15 months   . Drug use: No  . Sexual activity: Never  Lifestyle  . Physical activity    Days per week: Not on file    Minutes per session: Not on file  . Stress: Not on file  Relationships  . Social Herbalist on phone: Not on file    Gets together: Not on file    Attends religious  service: Not on file    Active member of club or organization: Not on file    Attends meetings of clubs or organizations: Not on file    Relationship status: Not on file  . Intimate partner violence    Fear of current or ex partner: Not on file    Emotionally abused: Not on file    Physically abused: Not on file    Forced sexual activity: Not on file  Other Topics Concern  . Not on file  Social History Narrative  . Not on file   ROS  Review of Systems  Constitution: Negative for chills, decreased appetite, malaise/fatigue and weight gain.  Cardiovascular: Negative for dyspnea on exertion, leg swelling and syncope.  Respiratory: Positive for shortness of breath. Negative for cough.   Endocrine: Negative for cold intolerance.  Hematologic/Lymphatic: Does  not bruise/bleed easily.  Musculoskeletal: Negative for joint swelling.  Gastrointestinal: Negative for abdominal pain, anorexia, change in bowel habit, hematochezia and melena.  Neurological: Negative for headaches and light-headedness.  Psychiatric/Behavioral: Negative for depression and substance abuse.  All other systems reviewed and are negative.  Objective  There were no vitals taken for this visit. There is no height or weight on file to calculate BMI.   Physical Exam  Constitutional: He appears well-developed and well-nourished. No distress.  HENT:  Head: Atraumatic.  Eyes: Conjunctivae are normal.  Neck: Neck supple. No JVD present. No thyromegaly present.  Cardiovascular: Intact distal pulses and normal pulses. An irregularly irregular rhythm present. Exam reveals no gallop, no S3 and no S4.  No murmur heard. S1 is variable, S2 is normal.  no edema  Pulmonary/Chest: Effort normal and breath sounds normal.  Abdominal: Soft. Bowel sounds are normal. He exhibits distension (ascites, minimal).  Musculoskeletal: Normal range of motion.        General: No edema.  Neurological: He is alert.  Skin: Skin is warm and dry.  Psychiatric: He has a normal mood and affect.   Radiology: No results found.  Laboratory examination:   Recent Labs    08/09/18 0326 08/10/18 0517 08/20/18 1027  NA 137 138 138  K 4.1 3.9 4.6  CL 96* 100 101  CO2 27 29 25   GLUCOSE 152* 134* 100*  BUN 16 18 11   CREATININE 1.09 0.90 1.08  CALCIUM 8.3* 8.3* 9.2  GFRNONAA >60 >60 65  GFRAA >60 >60 75   CMP Latest Ref Rng & Units 08/20/2018 08/10/2018 08/09/2018  Glucose 65 - 99 mg/dL 100(H) 134(H) 152(H)  BUN 7 - 25 mg/dL 11 18 16   Creatinine 0.70 - 1.18 mg/dL 1.08 0.90 1.09  Sodium 135 - 146 mmol/L 138 138 137  Potassium 3.5 - 5.3 mmol/L 4.6 3.9 4.1  Chloride 98 - 110 mmol/L 101 100 96(L)  CO2 20 - 32 mmol/L 25 29 27   Calcium 8.6 - 10.3 mg/dL 9.2 8.3(L) 8.3(L)  Total Protein 6.1 - 8.1 g/dL 6.5  5.6(L) 6.0(L)  Total Bilirubin 0.2 - 1.2 mg/dL 1.0 1.4(H) 1.9(H)  Alkaline Phos 38 - 126 U/L - 116 130(H)  AST 10 - 35 U/L 26 69(H) 46(H)  ALT 9 - 46 U/L 22 46(H) 27   CBC Latest Ref Rng & Units 08/20/2018 08/10/2018 08/09/2018  WBC 3.8 - 10.8 Thousand/uL 12.4(H) 15.4(H) 10.5  Hemoglobin 13.2 - 17.1 g/dL 15.1 12.9(L) 13.6  Hematocrit 38.5 - 50.0 % 44.4 37.9(L) 39.9  Platelets 140 - 400 Thousand/uL 286 252 286   Lipid Panel     Component Value  Date/Time   CHOL 154 10/24/2016 1004   TRIG 134 10/24/2016 1004   HDL 47 10/24/2016 1004   CHOLHDL 3.3 10/24/2016 1004   VLDL 27 10/24/2016 1004   LDLCALC 80 10/24/2016 1004   HEMOGLOBIN A1C Lab Results  Component Value Date   HGBA1C 5.8 (H) 07/29/2018   MPG 120 07/29/2018   TSH No results for input(s): TSH in the last 8760 hours. Medications   Current Outpatient Medications  Medication Instructions  . allopurinol (ZYLOPRIM) 100 MG tablet TAKE 2 TABLETS BY MOUTH EVERY DAY  . diltiazem (CARDIZEM CD) 180 mg, Oral, Daily  . fluticasone (FLONASE) 50 MCG/ACT nasal spray 2 sprays, Each Nare, Daily  . folic acid (FOLVITE) 662 mcg, Oral, Daily  . metoprolol tartrate (LOPRESSOR) 25 mg, Oral, 2 times daily  . spironolactone (ALDACTONE) 50 mg, Oral, Daily    Cardiac Studies:   Coronary angiogram 01/10/2013: Severe diffuse coronary calcification with scattered 20-30% stenosis. Normal LVEF.   Echocardiogram 08/07/2018: - Left ventricle: The cavity size was normal. Wall thickness was   increased in a pattern of mild LVH. Systolic function was mildly   reduced. The estimated ejection fraction was in the range of 45%   to 50%. Diffuse hypokinesis. The study is not technically   sufficient to allow evaluation of LV diastolic function. - Mitral valve: Mildly thickened leaflets . There was mild   regurgitation. - Left atrium: Severely dilated. - Right ventricle: Moderately dilated and moderately hypokinetic. - Right atrium: The atrium was  mildly dilated. - Tricuspid valve: There was mild regurgitation. - Pulmonary arteries: PA peak pressure: 40 mm Hg (S) + RAP. - Systemic veins: IVC is not visualized.  Assessment     ICD-10-CM   1. Permanent atrial fibrillation  I48.21    CHADVASC score: 3, high risk, 3.2% yearly risk of CVA  2. Essential hypertension  I10   3. Alcoholic cirrhosis of liver without ascites ([place])  K70.30     EKG 06/30/2019: Atrial fibrillation with controlled ventricular response at the rate of 88 bpm, left axis deviation, left anterior fascicular block.  Poor R-wave progression, pulmonary disease pattern.  PVCs (2). No significant change from  EKG 08/13/2018.  Recommendations:   Patient is here on a six-month office visit and follow-up of permanent atrial fibrillation, hypertension, coronary artery disease.  He has noticed gradual worsening dyspnea but no PND or orthopnea, no clinical evidence of heart failure.    I suspect his dyspnea is probably related to Deconditioning, mild COPD, he had bronchiectatic changes by CT scan in 2019, also elevated right hemidiaphragm.  However he had severe coronary calcification with mild diffuse luminal irregularity, progression of coronary disease cannot be excluded.  He did have pulmonary hypertension when he was ill however today there is no JVD, no leg edema, it is still possible that it may be pulmonary hypertension, but patient does not want any testing done for now and states that he is feeling the best he has except for dyspnea and would like to continue to watch this.  His remained abstinent from alcohol use.  I'll see him back in a year. He is not a candidate for anticoagulation due to cirrhosis of the liver and esophageal varices and risk of bleed.   I reviewed his labs.  Adrian Prows, MD, [facility] 06/30/2019, 1:36 PM [facility]. [place] Pager: [phone] Office: [phone] If no answer Cell [phone]

## 2019-09-01 ENCOUNTER — Other Ambulatory Visit: Payer: Self-pay

## 2019-09-01 ENCOUNTER — Encounter: Payer: Self-pay | Admitting: Family Medicine

## 2019-09-01 ENCOUNTER — Ambulatory Visit (INDEPENDENT_AMBULATORY_CARE_PROVIDER_SITE_OTHER): Payer: Medicare Other | Admitting: Family Medicine

## 2019-09-01 VITALS — BP 126/80 | HR 90 | Temp 98.4°F | Resp 14 | Ht 70.0 in | Wt 191.0 lb

## 2019-09-01 DIAGNOSIS — Z23 Encounter for immunization: Secondary | ICD-10-CM | POA: Diagnosis not present

## 2019-09-01 DIAGNOSIS — M1A09X0 Idiopathic chronic gout, multiple sites, without tophus (tophi): Secondary | ICD-10-CM

## 2019-09-01 DIAGNOSIS — I10 Essential (primary) hypertension: Secondary | ICD-10-CM

## 2019-09-01 DIAGNOSIS — I4821 Permanent atrial fibrillation: Secondary | ICD-10-CM | POA: Diagnosis not present

## 2019-09-01 DIAGNOSIS — Z8679 Personal history of other diseases of the circulatory system: Secondary | ICD-10-CM | POA: Diagnosis not present

## 2019-09-01 DIAGNOSIS — E7800 Pure hypercholesterolemia, unspecified: Secondary | ICD-10-CM | POA: Diagnosis not present

## 2019-09-01 DIAGNOSIS — K7030 Alcoholic cirrhosis of liver without ascites: Secondary | ICD-10-CM | POA: Diagnosis not present

## 2019-09-01 DIAGNOSIS — I25118 Atherosclerotic heart disease of native coronary artery with other forms of angina pectoris: Secondary | ICD-10-CM | POA: Diagnosis not present

## 2019-09-01 DIAGNOSIS — I5030 Unspecified diastolic (congestive) heart failure: Secondary | ICD-10-CM | POA: Diagnosis not present

## 2019-09-01 NOTE — Patient Instructions (Addendum)
Return for fasting labs Wednesday  Flu shot Given F/U 6 months for PHYSICAL

## 2019-09-01 NOTE — Progress Notes (Signed)
   Subjective:    Patient ID: Samuel Flores, male    DOB: [birth year], 78 y.o.   MRN: [id number]  Patient presents for Follow-up (is not fasting)   Pt here to f/u chronic medical probems  States he feels well   Seen by cardiology told to return in 1 year    He still has SOB ut does not want any more testing  He is still on aldactone, lopressor and cardizem     He had 5 skin surgeries over the past year, for [facility]  He has no new concerns today.  States that he has not had any alcohol in greater than a year and a half now.   Review Of Systems:  GEN- denies fatigue, fever, weight loss,weakness, recent illness HEENT- denies eye drainage, change in vision, nasal discharge, CVS- denies chest pain, palpitations RESP- denies SOB, cough, wheeze ABD- denies N/V, change in stools, abd pain GU- denies dysuria, hematuria, dribbling, incontinence MSK- denies joint pain, muscle aches, injury Neuro- denies headache, dizziness, syncope, seizure activity       Objective:    BP 126/80   Pulse 90   Temp 98.4 F (36.9 C) (Oral)   Resp 14   Ht 5\' 10"  (1.778 m)   Wt 191 lb (86.6 kg)   SpO2 97%   BMI 27.41 kg/m  GEN- NAD, alert and oriented x3 HEENT- PERRL, EOMI, non injected sclera, pink conjunctiva, MMM, oropharynx clear Neck- Supple, no thyromegaly CVS-irregular rhythm normal rate no murmur RESP-CTAB ABD-NABS,soft,NT,ND EXT- No edema Pulses- Radial, DP- 2+        Assessment & Plan:      Problem List Items Addressed This Visit      Unprioritized   A-fib ([place])    [place] rate controlled.  He is high risk for bleeding in the setting of his cirrhosis therefore he will not be on anticoagulant.      Alcoholic cirrhosis of liver without ascites ([place]) - Primary   CHF with left ventricular diastolic dysfunction, NYHA class 2 (H[facility]) (Chronic)    He is currently compensated.  He has CHF along with A. fib in the setting of his cirrhosis of the liver.  Continue with Aldactone.  He will  return for fasting labs.  His blood pressure is controlled.  I did review the last cardiology note.      Essential hypertension   Gout    Controlled with allopurinol no recent flares.      Hx of subdural hematoma   Hypercholesteremia    Check lipids.       Other Visit Diagnoses    Need for immunization against influenza       Relevant Orders   Flu Vaccine QUAD 36+ mos IM (Completed)      Note: This dictation was prepared with Dragon dictation along with smaller phrase technology. Any transcriptional errors that result from this process are unintentional.

## 2019-09-02 ENCOUNTER — Encounter: Payer: Self-pay | Admitting: Family Medicine

## 2019-09-02 NOTE — Assessment & Plan Note (Signed)
He is currently compensated.  He has CHF along with A. fib in the setting of his cirrhosis of the liver.  Continue with Aldactone.  He will return for fasting labs.  His blood pressure is controlled.  I did review the last cardiology note.

## 2019-09-02 NOTE — Assessment & Plan Note (Signed)
Check lipids 

## 2019-09-02 NOTE — Assessment & Plan Note (Signed)
Controlled with allopurinol no recent flares.

## 2019-09-02 NOTE — Assessment & Plan Note (Signed)
[place] rate controlled.  He is high risk for bleeding in the setting of his cirrhosis therefore he will not be on anticoagulant.

## 2019-09-03 ENCOUNTER — Other Ambulatory Visit: Payer: Self-pay

## 2019-09-03 ENCOUNTER — Other Ambulatory Visit: Payer: Medicare Other

## 2019-09-03 DIAGNOSIS — E7800 Pure hypercholesterolemia, unspecified: Secondary | ICD-10-CM | POA: Diagnosis not present

## 2019-09-03 DIAGNOSIS — I5030 Unspecified diastolic (congestive) heart failure: Secondary | ICD-10-CM

## 2019-09-03 DIAGNOSIS — K7030 Alcoholic cirrhosis of liver without ascites: Secondary | ICD-10-CM | POA: Diagnosis not present

## 2019-09-04 LAB — CBC WITH DIFFERENTIAL/PLATELET
Absolute Monocytes: 851 cells/uL (ref 200–950)
Basophils Absolute: 38 cells/uL (ref 0–200)
Basophils Relative: 0.3 %
Eosinophils Absolute: 216 cells/uL (ref 15–500)
Eosinophils Relative: 1.7 %
HCT: 46.7 % (ref 38.5–50.0)
Hemoglobin: 15.9 g/dL (ref 13.2–17.1)
Lymphs Abs: 1765 cells/uL (ref 850–3900)
MCH: 32.9 pg (ref 27.0–33.0)
MCHC: 34 g/dL (ref 32.0–36.0)
MCV: 96.5 fL (ref 80.0–100.0)
MPV: 10.2 fL (ref 7.5–12.5)
Monocytes Relative: 6.7 %
Neutro Abs: 9830 cells/uL — ABNORMAL HIGH (ref 1500–7800)
Neutrophils Relative %: 77.4 %
Platelets: 228 10*3/uL (ref 140–400)
RBC: 4.84 10*6/uL (ref 4.20–5.80)
RDW: 13.1 % (ref 11.0–15.0)
Total Lymphocyte: 13.9 %
WBC: 12.7 10*3/uL — ABNORMAL HIGH (ref 3.8–10.8)

## 2019-09-04 LAB — COMPREHENSIVE METABOLIC PANEL
AG Ratio: 1.6 (calc) (ref 1.0–2.5)
ALT: 17 U/L (ref 9–46)
AST: 19 U/L (ref 10–35)
Albumin: 4 g/dL (ref 3.6–5.1)
Alkaline phosphatase (APISO): 98 U/L (ref 35–144)
BUN: 19 mg/dL (ref 7–25)
CO2: 24 mmol/L (ref 20–32)
Calcium: 9.2 mg/dL (ref 8.6–10.3)
Chloride: 103 mmol/L (ref 98–110)
Creat: 1.09 mg/dL (ref 0.70–1.11)
Globulin: 2.5 g/dL (calc) (ref 1.9–3.7)
Glucose, Bld: 101 mg/dL — ABNORMAL HIGH (ref 65–99)
Potassium: 4.3 mmol/L (ref 3.5–5.3)
Sodium: 138 mmol/L (ref 135–146)
Total Bilirubin: 0.8 mg/dL (ref 0.2–1.2)
Total Protein: 6.5 g/dL (ref 6.1–8.1)

## 2019-09-04 LAB — LIPID PANEL
Cholesterol: 156 mg/dL (ref <200)
HDL: 30 mg/dL — ABNORMAL LOW (ref >=40)
LDL Cholesterol (Calc): 99 mg/dL (calc)
Non-HDL Cholesterol (Calc): 126 mg/dL (calc) (ref <130)
Total CHOL/HDL Ratio: 5.2 (calc) — ABNORMAL HIGH (ref <5.0)
Triglycerides: 167 mg/dL — ABNORMAL HIGH (ref <150)

## 2019-09-26 ENCOUNTER — Other Ambulatory Visit: Payer: Self-pay | Admitting: Family Medicine

## 2019-11-19 ENCOUNTER — Other Ambulatory Visit: Payer: Self-pay

## 2019-11-19 ENCOUNTER — Ambulatory Visit (INDEPENDENT_AMBULATORY_CARE_PROVIDER_SITE_OTHER): Payer: Medicare Other | Admitting: Family Medicine

## 2019-11-19 ENCOUNTER — Encounter: Payer: Self-pay | Admitting: Family Medicine

## 2019-11-19 VITALS — BP 118/68 | HR 82 | Temp 98.2°F | Resp 18 | Ht 70.0 in | Wt 194.0 lb

## 2019-11-19 DIAGNOSIS — H9193 Unspecified hearing loss, bilateral: Secondary | ICD-10-CM | POA: Diagnosis not present

## 2019-11-19 DIAGNOSIS — H6993 Unspecified Eustachian tube disorder, bilateral: Secondary | ICD-10-CM | POA: Diagnosis not present

## 2019-11-19 MED ORDER — PREDNISONE 20 MG PO TABS: 40.0000 mg | ORAL_TABLET | Freq: Every day | ORAL | 0 refills | Status: DC

## 2019-11-19 MED ORDER — LORATADINE 10 MG PO TABS: 10.0000 mg | ORAL_TABLET | Freq: Every day | ORAL | 1 refills | Status: DC

## 2019-11-19 NOTE — Progress Notes (Signed)
   Subjective:    Patient ID: Samuel Flores, male    DOB: [birth year], 79 y.o.   MRN: [id number]  Patient presents for Ear Issues (x3 days- ears feel stopped up- hearing decreasesd- no pain, no drainage)   Pt here with decreased hearing, feels like they are clogged up for the past 2-3 days. Feels like his ears need to pop. No HA, no sinus drainage has a little allergies uses OTC nasal spray, no cough  Used peroxide, and used ear kit yesterday to try to remove wax but nothing came out no allergy tablets    Note he does have some chronic underlying hearing loss which is not new   Review Of Systems:  GEN- denies fatigue, fever, weight loss,weakness, recent illness HEENT- denies eye drainage, change in vision, nasal discharge, CVS- denies chest pain, palpitations RESP- denies SOB, cough, wheeze ABD- denies N/V, change in stools, abd pain GU- denies dysuria, hematuria, dribbling, incontinence MSK- denies joint pain, muscle aches, injury Neuro- denies headache, dizziness, syncope, seizure activity       Objective:    BP 118/68   Pulse 82   Temp 98.2 F (36.8 C) (Temporal)   Resp 18   Ht _0  (1.778 m)   Wt 194 lb (88 kg)   SpO2 98%   BMI 27.84 kg/m  GEN- NAD, alert and oriented x3 HEENT- PERRL, EOMI, non injected sclera, pink conjunctiva, MMM, oropharynx clear, decreased light reflex on the left side but no erythema no bulging canals clear bilaterally no cerumen noted decreased gross hearing Neck- Supple, no lymphadenopathy CVS-irregular rhythm normal rate, no murmur RESP-CTAB        Assessment & Plan:      Problem List Items Addressed This Visit    None    Visit Diagnoses    Disorder of both eustachian tubes    -  Primary   Suspect he has eustachian tube dysfunction he does have some mild underlying allergies.  We will have him try antihistamine along with his nasal spray.  If he is not improved by Sunday he will take a 5-day course of prednisone.  He has not  had any recent antibiotics is not had any recent illness.  He does have underlying hearing loss in general.  So he is not back to his baseline in 2 weeks and then refer him to ear nose and throat.   Bilateral hearing loss, unspecified hearing loss type          Note: This dictation was prepared with Dragon dictation along with smaller phrase technology. Any transcriptional errors that result from this process are unintentional.

## 2019-11-19 NOTE — Patient Instructions (Addendum)
Take Claritin once a day Continue nasal spray every day Start prednisone if not improved in by Sunday If no improvement at all in 2 weeks, then call me you will be sent to [facility]  F/U as needed

## 2019-12-06 ENCOUNTER — Ambulatory Visit: Payer: Medicare Other

## 2019-12-06 ENCOUNTER — Other Ambulatory Visit: Payer: Self-pay | Admitting: Cardiology

## 2019-12-11 ENCOUNTER — Other Ambulatory Visit: Payer: Self-pay | Admitting: Family Medicine

## 2019-12-25 ENCOUNTER — Other Ambulatory Visit: Payer: Self-pay | Admitting: Family Medicine

## 2020-03-03 ENCOUNTER — Ambulatory Visit (INDEPENDENT_AMBULATORY_CARE_PROVIDER_SITE_OTHER): Payer: Medicare Other | Admitting: Family Medicine

## 2020-03-03 ENCOUNTER — Encounter: Payer: Self-pay | Admitting: Family Medicine

## 2020-03-03 ENCOUNTER — Other Ambulatory Visit: Payer: Self-pay

## 2020-03-03 VITALS — BP 124/62 | HR 88 | Temp 97.9°F | Resp 16 | Ht 70.0 in | Wt 194.0 lb

## 2020-03-03 DIAGNOSIS — I10 Essential (primary) hypertension: Secondary | ICD-10-CM | POA: Diagnosis not present

## 2020-03-03 DIAGNOSIS — E7800 Pure hypercholesterolemia, unspecified: Secondary | ICD-10-CM | POA: Diagnosis not present

## 2020-03-03 DIAGNOSIS — K7030 Alcoholic cirrhosis of liver without ascites: Secondary | ICD-10-CM | POA: Diagnosis not present

## 2020-03-03 DIAGNOSIS — Z0001 Encounter for general adult medical examination with abnormal findings: Secondary | ICD-10-CM | POA: Diagnosis not present

## 2020-03-03 DIAGNOSIS — M1A09X0 Idiopathic chronic gout, multiple sites, without tophus (tophi): Secondary | ICD-10-CM | POA: Diagnosis not present

## 2020-03-03 DIAGNOSIS — I5030 Unspecified diastolic (congestive) heart failure: Secondary | ICD-10-CM | POA: Diagnosis not present

## 2020-03-03 DIAGNOSIS — Z0000 Encounter for general adult medical examination without abnormal findings: Secondary | ICD-10-CM

## 2020-03-03 NOTE — Progress Notes (Signed)
Subjective:   Patient presents for Medicare Annual/Subsequent preventive examination.     Pt here for CPE, medications reviewed     AFIB- with chronic SOB rate controlled no blood thinner secondary to his liver disease and fall risk    Cirrhosis of liver he has not noted any swelling in his abdomen or his lower extremities, he has remained off alcohol for the past 2 years    Appetite is improved     Bowels are moving well, urinating okay     History of BCC- has not been back to dermatology, he has been putting this off he did have a spot on his nose that needs to be removed   Review Past Medical/Family/Social: Per EMR    Risk Factors  Current exercise habits: walking some, mowing with push mower  Dietary issues discussed: Yes  Cardiac risk factors: A fib, HTN Cirrhosis  Depression Screen  (Note: if answer to either of the following is "Yes", a more complete depression screening is indicated)  Over the past two weeks, have you felt down, depressed or hopeless? No Over the past two weeks, have you felt little interest or pleasure in doing things? No Have you lost interest or pleasure in daily life? No Do you often feel hopeless? No Do you cry easily over simple problems? No   Activities of Daily Living  In your present state of health, do you have any difficulty performing the following activities?:  Driving? No  Managing money? No  Feeding yourself? No  Getting from bed to chair? No  Climbing a flight of stairs? No  Preparing food and eating?: No  Bathing or showering? No  Getting dressed: No  Getting to the toilet? No  Using the toilet:No  Moving around from place to place: No  In the past year have you fallen or had a near fall?:No  Are you sexually active? No  Do you have more than one partner? No   Hearing Difficulties: yes - declines intervention Do you often ask people to speak up or repeat themselves? yes  Do you experience ringing or noises in your ears?  No Do you have difficulty understanding soft or whispered voices? No  Do you feel that you have a problem with memory? No Do you often misplace items? No  Do you feel safe at home? Yes  Cognitive Testing  Alert? Yes Normal Appearance?Yes  Oriented to person? Yes Place? Yes  Time? Yes  Recall of three objects? Yes  Can perform simple calculations? Yes  Displays appropriate judgment?Yes  Can read the correct time from a watch face?Yes   List the Names of Other Physician/Practitioners you currently use:   Dr. Nadyne Coombes   Dermatology    Screening Tests / Date Colonoscopy Over max AGE                     Zostavax UTD PNA- due for repeat  Influenza Vaccine  UTD COVID 19- he has not received  Tetanus/tdap due  ROS: GEN- denies fatigue, fever, weight loss,weakness, recent illness HEENT- denies eye drainage, change in vision, nasal discharge, CVS- denies chest pain, palpitations RESP- denies SOB, cough, wheeze ABD- denies N/V, change in stools, abd pain GU- denies dysuria, hematuria, dribbling, incontinence MSK- denies joint pain, muscle aches, injury Neuro- denies headache, dizziness, syncope, seizure activity  Physical: GEN- NAD, alert and oriented x3 HEENT- PERRL, EOMI, non injected sclera, pink conjunctiva, MMM, oropharynx clear Neck- Supple, no thryomegaly CVS- irregular rhythem,  normal rate, no murmur Skin- scaleylesion left corner of nares  RESP-CTAB ABD-NABS,soft,NT,ND EXT- No edema Pulses- Radial, DP- 2+    Assessment:    Annual wellness medicare exam   Plan:    During the course of the visit the patient was educated and counseled about appropriate screening and preventive services including:   Atrial fibrillation/ chf rate controlled no change in medication reviewed last cardiology note.  Not a good candidate for anticoagulation.  Cirrhosis he does not appear decompensated.  He is avoiding alcohol.  His last set of labs in the fall were normal.  He does not  like to have his blood drawn very often since he is stable I will defer this to her next visit in a few months.  History of prostate cancer no longer checking PSA  Immunizations he is due for pneumonia 23 however he would like to get COVID-19 vaccine done as soon as possible so I will defer this today get this at the next visit.  He has some advanced directives noted.  He states that he is full code but does not want prolonged ventilation.  I gave him another handout on advanced directives he is in a check this what he has written up in his living will to see if he wants to make any changes.  Gout continue on allopurinol no recent exacerbations  History of BCC- recommend dermatology appt, he will schedule   FALL/CAGE/Depression screen neg     Diet review for nutrition referral? Yes ____ Not Indicated __x__  Patient Instructions (the written plan) was given to the patient.  Medicare Attestation  I have personally reviewed:  The patient's medical and social history  Their use of alcohol, tobacco or illicit drugs  Their current medications and supplements  The patient's functional ability including ADLs,fall risks, home safety risks, cognitive, and hearing and visual impairment  Diet and physical activities  Evidence for depression or mood disorders  The patient's weight, height, BMI, and visual acuity have been recorded in the chart. I have made referrals, counseling, and provided education to the patient based on review of the above and I have provided the patient with a written personalized care plan for preventive services.

## 2020-03-03 NOTE — Patient Instructions (Signed)
F/U 4 months  No changes to medications Call the skin doctor

## 2020-03-08 ENCOUNTER — Other Ambulatory Visit: Payer: Self-pay | Admitting: Cardiology

## 2020-03-23 ENCOUNTER — Other Ambulatory Visit: Payer: Self-pay | Admitting: Cardiology

## 2020-03-23 DIAGNOSIS — K7030 Alcoholic cirrhosis of liver without ascites: Secondary | ICD-10-CM

## 2020-05-24 ENCOUNTER — Telehealth: Payer: Self-pay | Admitting: Family Medicine

## 2020-05-24 NOTE — Progress Notes (Signed)
  Chronic Care Management   Note  05/24/2020 Name: BRAIDYN SCORSONE MRN: [id number] DOB: 1941/09/08  JIBREEL FEDEWA is a 79 y.o. year old male who is a primary care patient of [place], Modena Nunnery, MD. I reached out to Gaston Islam by phone today in response to a referral sent by Mr. Nichlos Kunzler Becker's PCP, Alycia Rossetti, MD.   Mr. Larke was given information about Chronic Care Management services today including:  1. CCM service includes personalized support from designated clinical staff supervised by his physician, including individualized plan of care and coordination with other care providers 2. 24/7 contact phone numbers for assistance for urgent and routine care needs. 3. Service will only be billed when office clinical staff spend 20 minutes or more in a month to coordinate care. 4. Only one practitioner may furnish and bill the service in a calendar month. 5. The patient may stop CCM services at any time (effective at the end of the month) by phone call to the office staff.   Patient agreed to services and verbal consent obtained.   Follow up plan:   [place]

## 2020-06-05 ENCOUNTER — Other Ambulatory Visit: Payer: Self-pay | Admitting: Cardiology

## 2020-06-21 ENCOUNTER — Other Ambulatory Visit: Payer: Self-pay | Admitting: Family Medicine

## 2020-06-23 ENCOUNTER — Ambulatory Visit: Payer: Medicare Other | Admitting: Cardiology

## 2020-07-01 ENCOUNTER — Ambulatory Visit: Payer: Medicare Other | Admitting: Cardiology

## 2020-07-05 ENCOUNTER — Other Ambulatory Visit: Payer: Self-pay

## 2020-07-05 ENCOUNTER — Encounter: Payer: Self-pay | Admitting: Family Medicine

## 2020-07-05 ENCOUNTER — Ambulatory Visit (INDEPENDENT_AMBULATORY_CARE_PROVIDER_SITE_OTHER): Payer: Medicare Other | Admitting: Family Medicine

## 2020-07-05 VITALS — BP 118/64 | HR 80 | Temp 98.2°F | Resp 16 | Ht 70.0 in | Wt 189.0 lb

## 2020-07-05 DIAGNOSIS — I5030 Unspecified diastolic (congestive) heart failure: Secondary | ICD-10-CM

## 2020-07-05 DIAGNOSIS — I4821 Permanent atrial fibrillation: Secondary | ICD-10-CM | POA: Diagnosis not present

## 2020-07-05 DIAGNOSIS — I10 Essential (primary) hypertension: Secondary | ICD-10-CM | POA: Diagnosis not present

## 2020-07-05 DIAGNOSIS — K7030 Alcoholic cirrhosis of liver without ascites: Secondary | ICD-10-CM

## 2020-07-05 DIAGNOSIS — E7800 Pure hypercholesterolemia, unspecified: Secondary | ICD-10-CM | POA: Diagnosis not present

## 2020-07-05 NOTE — Assessment & Plan Note (Signed)
Continue aldactone

## 2020-07-05 NOTE — Progress Notes (Signed)
   Subjective:    Patient ID: Samuel Flores, male    DOB: 1940/12/31, 79 y.o.   MRN: [id number]  Patient presents for Follow-up (is not fasting) Here follow-up chronic medical problems.  He does not have any particular concerns.  He is taking his medicines as prescribed.   His wife recently had surgery and then had a fall afterwards he thinks he can challenging at home but they have hired a Marine scientist to help assist him.     Proximal atrial fibrillation he is scheduled to see his cardiologist this fall.  He has not had any chest pain no change in his shortness of breath which is chronic.  He has not noted any fluid buildup.     Review Of Systems:  GEN- denies fatigue, fever, weight loss,weakness, recent illness HEENT- denies eye drainage, change in vision, nasal discharge, CVS- denies chest pain, palpitations RESP- + chronic SOB, cough, wheeze ABD- denies N/V, change in stools, abd pain GU- denies dysuria, hematuria, dribbling, incontinence MSK- denies joint pain, muscle aches, injury Neuro- denies headache, dizziness, syncope, seizure activity       Objective:    BP 118/64   Pulse 80   Temp 98.2 F (36.8 C) (Temporal)   Resp 16   Ht 5\' 10"  (1.778 m)   Wt 189 lb (85.7 kg)   SpO2 93%   BMI 27.12 kg/m  GEN- NAD, alert and oriented x3 HEENT- PERRL, EOMI, non injected sclera, pink conjunctiva, MMM, oropharynx clear Neck- Supple, no thyromegaly CVS- irregular rhythem, normal rate, no murmur RESP-CTAB ABD-NABS,soft,NT,ND EXT- No edema Pulses- Radial,        Assessment & Plan:     Weight down, since his wife had surgery he has been doing the meals, lots of sandwiches and light meals   Problem List Items Addressed This Visit      Unprioritized   A-fib ([place])    Irregular rhythem, controlled with cardizem No blood thinner due to liver disease      Alcoholic cirrhosis of liver without ascites (HCC)    currently stable, he remains off ETOH No fluid overload       CHF with left ventricular diastolic dysfunction, NYHA class 2 (HCC) (Chronic)    Continue aldactone      Essential hypertension - Primary    Controlled       Relevant Orders   CBC with Differential/Platelet   Comprehensive metabolic panel   Hypercholesteremia   Relevant Orders   Lipid panel      Note: This dictation was prepared with Dragon dictation along with smaller phrase technology. Any transcriptional errors that result from this process are unintentional.

## 2020-07-05 NOTE — Assessment & Plan Note (Signed)
currently stable, he remains off ETOH No fluid overload

## 2020-07-05 NOTE — Patient Instructions (Signed)
F/U end of April for Wellness visit

## 2020-07-05 NOTE — Assessment & Plan Note (Signed)
Irregular rhythem, controlled with cardizem No blood thinner due to liver disease

## 2020-07-05 NOTE — Assessment & Plan Note (Signed)
Controlled.  

## 2020-07-06 LAB — COMPREHENSIVE METABOLIC PANEL
AG Ratio: 1.8 (calc) (ref 1.0–2.5)
ALT: 24 U/L (ref 9–46)
AST: 23 U/L (ref 10–35)
Albumin: 4.2 g/dL (ref 3.6–5.1)
Alkaline phosphatase (APISO): 90 U/L (ref 35–144)
BUN: 19 mg/dL (ref 7–25)
CO2: 24 mmol/L (ref 20–32)
Calcium: 9.2 mg/dL (ref 8.6–10.3)
Chloride: 103 mmol/L (ref 98–110)
Creat: 1.09 mg/dL (ref 0.70–1.11)
Globulin: 2.3 g/dL (calc) (ref 1.9–3.7)
Glucose, Bld: 101 mg/dL — ABNORMAL HIGH (ref 65–99)
Potassium: 4.1 mmol/L (ref 3.5–5.3)
Sodium: 137 mmol/L (ref 135–146)
Total Bilirubin: 0.7 mg/dL (ref 0.2–1.2)
Total Protein: 6.5 g/dL (ref 6.1–8.1)

## 2020-07-06 LAB — CBC WITH DIFFERENTIAL/PLATELET
Absolute Monocytes: 818 cells/uL (ref 200–950)
Basophils Absolute: 37 cells/uL (ref 0–200)
Basophils Relative: 0.3 %
Eosinophils Absolute: 99 cells/uL (ref 15–500)
Eosinophils Relative: 0.8 %
HCT: 46.8 % (ref 38.5–50.0)
Hemoglobin: 16.2 g/dL (ref 13.2–17.1)
Lymphs Abs: 1290 cells/uL (ref 850–3900)
MCH: 32.5 pg (ref 27.0–33.0)
MCHC: 34.6 g/dL (ref 32.0–36.0)
MCV: 94 fL (ref 80.0–100.0)
MPV: 10.2 fL (ref 7.5–12.5)
Monocytes Relative: 6.6 %
Neutro Abs: 10156 cells/uL — ABNORMAL HIGH (ref 1500–7800)
Neutrophils Relative %: 81.9 %
Platelets: 230 10*3/uL (ref 140–400)
RBC: 4.98 10*6/uL (ref 4.20–5.80)
RDW: 13.4 % (ref 11.0–15.0)
Total Lymphocyte: 10.4 %
WBC: 12.4 10*3/uL — ABNORMAL HIGH (ref 3.8–10.8)

## 2020-07-06 LAB — LIPID PANEL
Cholesterol: 139 mg/dL (ref <200)
HDL: 30 mg/dL — ABNORMAL LOW (ref >=40)
LDL Cholesterol (Calc): 82 mg/dL (calc)
Non-HDL Cholesterol (Calc): 109 mg/dL (calc) (ref <130)
Total CHOL/HDL Ratio: 4.6 (calc) (ref <5.0)
Triglycerides: 176 mg/dL — ABNORMAL HIGH (ref <150)

## 2020-08-16 ENCOUNTER — Other Ambulatory Visit: Payer: Self-pay

## 2020-08-16 ENCOUNTER — Encounter: Payer: Self-pay | Admitting: Cardiology

## 2020-08-16 ENCOUNTER — Ambulatory Visit: Payer: Medicare Other

## 2020-08-16 ENCOUNTER — Ambulatory Visit: Payer: Medicare Other | Admitting: Cardiology

## 2020-08-16 VITALS — BP 104/63 | HR 82 | Resp 16 | Ht 70.0 in | Wt 191.0 lb

## 2020-08-16 DIAGNOSIS — I10 Essential (primary) hypertension: Secondary | ICD-10-CM | POA: Diagnosis not present

## 2020-08-16 DIAGNOSIS — K7030 Alcoholic cirrhosis of liver without ascites: Secondary | ICD-10-CM

## 2020-08-16 DIAGNOSIS — I25118 Atherosclerotic heart disease of native coronary artery with other forms of angina pectoris: Secondary | ICD-10-CM

## 2020-08-16 DIAGNOSIS — I4821 Permanent atrial fibrillation: Secondary | ICD-10-CM

## 2020-08-16 NOTE — Progress Notes (Signed)
Primary Physician/Referring:  Alycia Rossetti, MD  Patient ID: Samuel Flores, male    DOB: 1941/06/06, 79 y.o.   MRN: [id number]  Chief Complaint  Patient presents with   Follow-up    1 year   Atrial Fibrillation   HPI:    Samuel Flores  is a 79 y.o.  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    He is presently doing well and has remained abstinent from alcohol.  He has not had any recent hospitalization, he has not had any significant ascites as well. He does have mild chronic dyspnea and cough which is unchanged.  No leg edema, no PND or orthopnea.    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   Hx-TIA (transient ischemic attack) 2011   Hypercholesteremia    Hypertension    Past Surgical History:  Procedure Laterality Date   BURR HOLE FOR SUBDURAL HEMATOMA  03/2011   CARDIOVERSION N/A 02/20/2017   Procedure: CARDIOVERSION;  Surgeon: Adrian Prows, MD;  Location: [facility];  Service: Cardiovascular;  Laterality: N/A;   CHOLECYSTECTOMY N/A 08/08/2018   Procedure: LAPAROSCOPIC CHOLECYSTECTOMY;  Surgeon: Stark Klein, MD;  Location: [facility];  Service: General;  Laterality: N/A;   EYE SURGERY Left 03/2009   cataract   LEFT HEART CATHETERIZATION WITH CORONARY ANGIOGRAM N/A 01/02/2013   Procedure: LEFT HEART CATHETERIZATION WITH CORONARY ANGIOGRAM;  Surgeon: Laverda Page, MD;  Location: [facility] CATH LAB;  Service:  Cardiovascular;  Laterality: N/A;   PROSTATE SURGERY  1994   SKIN SURGERY     SPINE SURGERY     cervical   Social History   Tobacco Use   Smoking status: Former Smoker    Years: 39.00    Types: Pipe    Quit date: 04/09/2013    Years since quitting: 7.3   Smokeless tobacco: Never Used  Substance Use Topics   Alcohol use: Not Currently    Comment: Pt has not had alcohol in 15 months   Marital Status: Married   ROS  Review of Systems  Cardiovascular: Positive for dyspnea on exertion. Negative for chest pain and leg swelling.  Respiratory: Positive for wheezing.   Gastrointestinal: Negative for melena.   Objective  Blood pressure 104/63, pulse 82, resp. rate 16, height 5\' 10"  (1.778 m), weight 191 lb (86.6 kg), SpO2 96 %. Body mass index is 27.41 kg/m.  Vitals with BMI 08/16/2020 07/05/2020 03/03/2020  Height 5\' 10"  5\' 10"  5\' 10"   Weight 191 lbs 189 lbs 194 lbs  BMI 27.41 86.76 72.09  Systolic 470 962 836  Diastolic 63 64 62  Pulse 82 80 88     Physical Exam Constitutional:      General: He is not in acute distress.    Appearance: He is well-developed.  Neck:     Thyroid: No thyromegaly.     Vascular: No JVD.  Cardiovascular:     Rate and  Rhythm: Rhythm irregularly irregular.     Pulses: Normal pulses and intact distal pulses.     Heart sounds: No murmur heard.  No gallop. No S3 or S4 sounds.      Comments: S1 is variable, S2 is normal.  no edema Pulmonary:     Effort: Pulmonary effort is normal.     Breath sounds: Normal breath sounds.  Abdominal:     General: Bowel sounds are normal. There is distension (ascites, minimal).     Palpations: Abdomen is soft.    Radiology: No results found.  Laboratory examination:   Recent Labs    09/03/19 0849 07/05/20 1558  NA 138 137  K 4.3 4.1  CL 103 103  CO2 24 24  GLUCOSE 101* 101*  BUN 19 19  CREATININE 1.09 1.09  CALCIUM 9.2 9.2   CMP Latest Ref Rng & Units 07/05/2020 09/03/2019 08/20/2018  Glucose 65  - 99 mg/dL 101(H) 101(H) 100(H)  BUN 7 - 25 mg/dL 19 19 11   Creatinine 0.70 - 1.11 mg/dL 1.09 1.09 1.08  Sodium 135 - 146 mmol/L 137 138 138  Potassium 3.5 - 5.3 mmol/L 4.1 4.3 4.6  Chloride 98 - 110 mmol/L 103 103 101  CO2 20 - 32 mmol/L 24 24 25   Calcium 8.6 - 10.3 mg/dL 9.2 9.2 9.2  Total Protein 6.1 - 8.1 g/dL 6.5 6.5 6.5  Total Bilirubin 0.2 - 1.2 mg/dL 0.7 0.8 1.0  Alkaline Phos 38 - 126 U/L - - -  AST 10 - 35 U/L 23 19 26   ALT 9 - 46 U/L 24 17 22    CBC Latest Ref Rng & Units 07/05/2020 09/03/2019 08/20/2018  WBC 3.8 - 10.8 Thousand/uL 12.4(H) 12.7(H) 12.4(H)  Hemoglobin 13.2 - 17.1 g/dL 16.2 15.9 15.1  Hematocrit 38 - 50 % 46.8 46.7 44.4  Platelets 140 - 400 Thousand/uL 230 228 286   Lipid Panel     Component Value Date/Time   CHOL 139 07/05/2020 1558   TRIG 176 (H) 07/05/2020 1558   HDL 30 (L) 07/05/2020 1558   CHOLHDL 4.6 07/05/2020 1558   VLDL 27 10/24/2016 1004   LDLCALC 82 07/05/2020 1558   HEMOGLOBIN A1C Lab Results  Component Value Date   HGBA1C 5.8 (H) 07/29/2018   MPG 120 07/29/2018   TSH No results for input(s): TSH in the last 8760 hours. Medications   Current Outpatient Medications on File Prior to Visit  Medication Sig Dispense Refill   allopurinol (ZYLOPRIM) 100 MG tablet TAKE 2 TABLETS BY MOUTH EVERY DAY 180 tablet 2   diltiazem (CARDIZEM CD) 180 MG 24 hr capsule TAKE 1 CAPSULE BY MOUTH EVERY DAY 90 capsule 1   folic acid (FOLVITE) 811 MCG tablet Take 800 mcg by mouth daily.     metoprolol tartrate (LOPRESSOR) 25 MG tablet TAKE 1 TABLET BY MOUTH TWICE A DAY 180 tablet 2   spironolactone (ALDACTONE) 50 MG tablet TAKE 1 TABLET BY MOUTH EVERY DAY 90 tablet 1   No current facility-administered medications on file prior to visit.    Cardiac Studies:   Coronary angiogram 01/10/2013: Severe diffuse coronary calcification with scattered 20-30% stenosis. Normal LVEF.   Echocardiogram 08/07/2018: - Left ventricle: The cavity size was normal. Wall  thickness was  increased in a pattern of mild LVH. Systolic function was mildly  reduced. The estimated ejection fraction was in the range of 45%   to 50%. Diffuse hypokinesis. The study is not technically sufficient to allow evaluation of LV diastolic function. -  Mitral valve: Mildly thickened leaflets . There was mild  regurgitation. - Left atrium: Severely dilated. - Right ventricle: Moderately dilated and moderately hypokinetic. - Right atrium: The atrium was mildly dilated. - Tricuspid valve: There was mild regurgitation. - Pulmonary arteries: PA peak pressure: 40 mm Hg (S) + RAP. - Systemic veins: IVC is not visualized.  EKG:    EKG 810/03/2020: Atrial fibrillation with controlled ventricular response at the rate of 82 bpm, inferior infarct old, anteroseptal infarct old.  Nonspecific T abnormality.  No significant change from EKG 06/30/2019   Assessment     ICD-10-CM   1. Permanent atrial fibrillation (HCC)  I48.21 EKG 12-Lead  2. 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 Essential hypertension  I10   4. Alcoholic cirrhosis of liver without ascites ([place])  K70.30     EKG 06/30/2019: Atrial fibrillation with controlled ventricular response at the rate of 88 bpm, left axis deviation, left anterior fascicular block.  Poor R-wave progression, pulmonary disease pattern.  PVCs (2). No significant change from  EKG 08/13/2018.  No orders of the defined types were placed in this encounter. There are no discontinued medications.    Recommendations:   Samuel Flores  is a 79 y.o. 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    He is presently doing well and has remained abstinent from alcohol.   He has not had any recent hospitalization.  I reviewed his labs, LFTs have remained stable, no significant ascites on exam and lipids also well controlled and would not push to get LDL <70 with high dose statins in view of underlying cirrhosis of liver.  No changes were done, blood pressures well controlled, I will see him back in 1 year for follow-up.  Atrial fibrillation remains rate controlled and CBC has remained stable and platelet count is improved.   Adrian Prows, MD, [facility] 08/16/2020, 2:54 PM Office: [phone]

## 2020-08-25 ENCOUNTER — Other Ambulatory Visit: Payer: Self-pay | Admitting: *Deleted

## 2020-08-25 DIAGNOSIS — I4821 Permanent atrial fibrillation: Secondary | ICD-10-CM

## 2020-08-25 DIAGNOSIS — E7800 Pure hypercholesterolemia, unspecified: Secondary | ICD-10-CM

## 2020-08-25 DIAGNOSIS — I5030 Unspecified diastolic (congestive) heart failure: Secondary | ICD-10-CM

## 2020-08-25 DIAGNOSIS — M1A09X0 Idiopathic chronic gout, multiple sites, without tophus (tophi): Secondary | ICD-10-CM

## 2020-08-25 DIAGNOSIS — I10 Essential (primary) hypertension: Secondary | ICD-10-CM

## 2020-08-25 DIAGNOSIS — K7030 Alcoholic cirrhosis of liver without ascites: Secondary | ICD-10-CM

## 2020-08-26 ENCOUNTER — Telehealth: Payer: Self-pay | Admitting: Family Medicine

## 2020-08-26 NOTE — Progress Notes (Signed)
  Chronic Care Management   Outreach Note  08/26/2020 Name: Samuel Flores MRN: [id number] DOB: 16-Oct-1941  Referred by: Alycia Rossetti, MD Reason for referral : No chief complaint on file.   An unsuccessful telephone outreach was attempted today. The patient was referred to the pharmacist for assistance with care management and care coordination.   Follow Up Plan:   Carley Perdue [employer] Scheduler

## 2020-09-22 ENCOUNTER — Other Ambulatory Visit: Payer: Self-pay | Admitting: Cardiology

## 2020-09-22 DIAGNOSIS — K7030 Alcoholic cirrhosis of liver without ascites: Secondary | ICD-10-CM

## 2020-10-18 ENCOUNTER — Other Ambulatory Visit: Payer: Self-pay

## 2020-10-18 ENCOUNTER — Ambulatory Visit: Payer: Medicare Other | Admitting: Student

## 2020-10-18 ENCOUNTER — Encounter: Payer: Self-pay | Admitting: Student

## 2020-10-18 VITALS — BP 124/68 | HR 100 | Resp 16 | Ht 70.0 in | Wt 187.0 lb

## 2020-10-18 DIAGNOSIS — I25118 Atherosclerotic heart disease of native coronary artery with other forms of angina pectoris: Secondary | ICD-10-CM

## 2020-10-18 DIAGNOSIS — R079 Chest pain, unspecified: Secondary | ICD-10-CM | POA: Diagnosis not present

## 2020-10-18 DIAGNOSIS — I10 Essential (primary) hypertension: Secondary | ICD-10-CM | POA: Diagnosis not present

## 2020-10-18 DIAGNOSIS — I4821 Permanent atrial fibrillation: Secondary | ICD-10-CM | POA: Diagnosis not present

## 2020-10-18 MED ORDER — METOPROLOL TARTRATE 25 MG PO TABS
50.0000 mg | ORAL_TABLET | Freq: Two times a day (BID) | ORAL | 2 refills | Status: DC
Start: 2020-10-18 — End: 2020-11-01

## 2020-10-18 NOTE — Progress Notes (Signed)
Primary Physician/Referring:  Alycia Rossetti, MD  Patient ID: Samuel Flores, male    DOB: 1941-05-17, 79 y.o.   MRN: [id number]  Chief Complaint  Patient presents with  . Chest Pain   HPI:    Samuel Flores  is a 79 y.o.  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    Patient presents for urgent visit with complaints of chest pain.  Patient reports intermittent nonexertional left-sided chest pain lasting 30-40 minutes.  States he notices for the first time around September 13, 2020 after he was blowing leaves in his yard for significant amount of time.  He had no problems while blowing leaves, but afterwards felt pain in the left side of his chest and left arm.  Since then he has had recurrence of the chest discomfort a few times weekly, occasionally associated with left arm pain as well.  He has taken Tylenol several times, which significantly improves this pain.  Patient states chest pain seems to be better with activity, and worse at rest.  He denies associated dyspnea, dizziness, syncope, near syncope.  Denies orthopnea, PND, leg swelling.  Does report some mild nasal congestion over the last 1-2 weeks for which he is seeing his PCP later this week.  Past Medical History:  Diagnosis Date  . Alcohol abuse, daily use 08/18/2013  . Alcoholic cirrhosis of liver with ascites ([place]) 08/01/2018  . Arthritis    osteoarthritis  . CAD (coronary artery disease)    Non obstructive  . Cancer [facility]) 1994   prostate  . Diastolic dysfunction Echo 2012   Grade 1  . Essential hypertension 07/15/2013  . Gout   . 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 Hx-TIA (transient ischemic attack) 2011  . Hypercholesteremia   . Hypertension    Past Surgical History:  Procedure Laterality Date  .  BURR HOLE FOR SUBDURAL HEMATOMA  03/2011  . CARDIOVERSION N/A 02/20/2017   Procedure: CARDIOVERSION;  Surgeon: Adrian Prows, MD;  Location: [place];  Service: Cardiovascular;  Laterality: N/A;  . CHOLECYSTECTOMY N/A 08/08/2018   Procedure: LAPAROSCOPIC CHOLECYSTECTOMY;  Surgeon: Stark Klein, MD;  Location: [facility];  Service: General;  Laterality: N/A;  . EYE SURGERY Left 03/2009   cataract  . LEFT HEART CATHETERIZATION WITH CORONARY ANGIOGRAM N/A 01/02/2013   Procedure: LEFT HEART CATHETERIZATION WITH CORONARY ANGIOGRAM;  Surgeon: Laverda Page, MD;  Location: [facility] C[facility] LAB;  Service: Cardiovascular;  Laterality: N/A;  . PROSTATE SURGERY  1994  . SKIN SURGERY    . SPINE SURGERY     cervical   Social History   Tobacco Use  . Smoking status: Former Smoker    Years: 39.00    Types: Pipe    Quit date: 04/09/2013    Years since quitting: 7.5  . Smokeless tobacco: Never Used  Substance Use Topics  . Alcohol use: Not Currently    Comment: Pt has not had alcohol in 15 months   Marital Status: Married   ROS  Review of Systems  Constitutional: Negative for malaise/fatigue and weight gain.  Cardiovascular: Positive for chest pain and dyspnea on exertion. Negative for claudication, leg swelling, near-syncope, orthopnea, palpitations, paroxysmal nocturnal dyspnea and syncope.  Respiratory: Positive for wheezing. Negative for shortness of breath.   Hematologic/Lymphatic: Does not bruise/bleed easily.  Musculoskeletal:  Intermittent left arm pain.   Gastrointestinal: Negative for melena.  Neurological: Negative for dizziness and weakness.   Objective  Blood pressure 124/68, pulse 100, resp. rate 16, height 5\' 10"  (1.778 m), weight 187 lb (84.8 kg), SpO2 95 %. Body mass index is 26.83 kg/m.  Vitals with BMI 10/18/2020 08/16/2020 07/05/2020  Height 5\' 10"  5\' 10"  5\' 10"   Weight 187 lbs 191 lbs 189 lbs  BMI 26.83 51.88 41.66  Systolic 063 016 010  Diastolic 68 63 64  Pulse 932 82 80      Physical Exam Vitals reviewed.  Constitutional:      General: He is not in acute distress.    Appearance: He is well-developed.  HENT:     Head: Normocephalic and atraumatic.  Neck:     Thyroid: No thyromegaly.     Vascular: No JVD.  Cardiovascular:     Rate and Rhythm: Normal rate. Rhythm irregularly irregular.     Pulses: Normal pulses and intact distal pulses.     Heart sounds: S1 normal and S2 normal. No murmur heard.  No gallop. No S3 or S4 sounds.      Comments: S1 is variable, S2 is normal.  no edema Pulmonary:     Effort: Pulmonary effort is normal. No respiratory distress.     Breath sounds: Normal breath sounds. No wheezing, rhonchi or rales.  Abdominal:     General: Bowel sounds are normal. There is no distension.     Palpations: Abdomen is soft.  Musculoskeletal:     Right lower leg: No edema.     Left lower leg: No edema.  Neurological:     Mental Status: He is alert.     Laboratory examination:   Recent Labs    07/05/20 1558  NA 137  K 4.1  CL 103  CO2 24  GLUCOSE 101*  BUN 19  CREATININE 1.09  CALCIUM 9.2   CMP Latest Ref Rng & Units 07/05/2020 09/03/2019 08/20/2018  Glucose 65 - 99 mg/dL 101(H) 101(H) 100(H)  BUN 7 - 25 mg/dL 19 19 11   Creatinine 0.70 - 1.11 mg/dL 1.09 1.09 1.08  Sodium 135 - 146 mmol/L 137 138 138  Potassium 3.5 - 5.3 mmol/L 4.1 4.3 4.6  Chloride 98 - 110 mmol/L 103 103 101  CO2 20 - 32 mmol/L 24 24 25   Calcium 8.6 - 10.3 mg/dL 9.2 9.2 9.2  Total Protein 6.1 - 8.1 g/dL 6.5 6.5 6.5  Total Bilirubin 0.2 - 1.2 mg/dL 0.7 0.8 1.0  Alkaline Phos 38 - 126 U/L - - -  AST 10 - 35 U/L 23 19 26   ALT 9 - 46 U/L 24 17 22    CBC Latest Ref Rng & Units 07/05/2020 09/03/2019 08/20/2018  WBC 3.8 - 10.8 Thousand/uL 12.4(H) 12.7(H) 12.4(H)  Hemoglobin 13.2 - 17.1 g/dL 16.2 15.9 15.1  Hematocrit 38 - 50 % 46.8 46.7 44.4  Platelets 140 - 400 Thousand/uL 230 228 286   Lipid Panel     Component Value Date/Time   CHOL 139 07/05/2020 1558    TRIG 176 (H) 07/05/2020 1558   HDL 30 (L) 07/05/2020 1558   CHOLHDL 4.6 07/05/2020 1558   VLDL 27 10/24/2016 1004   LDLCALC 82 07/05/2020 1558   HEMOGLOBIN A1C Lab Results  Component Value Date   HGBA1C 5.8 (H) 07/29/2018   MPG 120 07/29/2018   TSH No results for input(s): TSH in the last 8760 hours. Medications   Current Outpatient Medications on File Prior  to Visit  Medication Sig Dispense Refill  . allopurinol (ZYLOPRIM) 100 MG tablet TAKE 2 TABLETS BY MOUTH EVERY DAY 180 tablet 2  . diltiazem (CARDIZEM CD) 180 MG 24 hr capsule TAKE 1 CAPSULE BY MOUTH EVERY DAY 90 capsule 1  . folic acid (FOLVITE) 660 MCG tablet Take 800 mcg by mouth daily.    Marland Kitchen spironolactone (ALDACTONE) 50 MG tablet TAKE 1 TABLET BY MOUTH EVERY DAY 90 tablet 3   No current facility-administered medications on file prior to visit.     Radiology  No results found.  Cardiac Studies:   Coronary angiogram 01/10/2013: Severe diffuse coronary calcification with scattered 20-30% stenosis. Normal LVEF.   Echocardiogram 08/07/2018: - Left ventricle: The cavity size was normal. Wall thickness was  increased in a pattern of mild LVH. Systolic function was mildly  reduced. The estimated ejection fraction was in the range of 45%   to 50%. Diffuse hypokinesis. The study is not technically sufficient to allow evaluation of LV diastolic function. - Mitral valve: Mildly thickened leaflets . There was mild  regurgitation. - Left atrium: Severely dilated. - Right ventricle: Moderately dilated and moderately hypokinetic. - Right atrium: The atrium was mildly dilated. - Tricuspid valve: There was mild regurgitation. - Pulmonary arteries: PA peak pressure: 40 mm Hg (S) + RAP. - Systemic veins: IVC is not visualized.  EKG:   EKG 10/18/2020: Atrial fibrillation with rapid ventricular response at a rate of 107 bpm with ectopic ventricular complexes.  Left axis.  Inferior infarct old.  Poor R wave progression, cannot exclude  anteroseptal infarct old.  Nonspecific T wave abnormality.  Compared to EKG 08/17/2020, rapid ventricular response and left axis new.  EKG 08/17/2020: Atrial fibrillation with controlled ventricular response at the rate of 82 bpm, inferior infarct old, anteroseptal infarct old.  Nonspecific T abnormality.  No significant change from EKG 06/30/2019   EKG 06/30/2019: Atrial fibrillation with controlled ventricular response at the rate of 88 bpm, left axis deviation, left anterior fascicular block.  Poor R-wave progression, pulmonary disease pattern.  PVCs (2). No significant change from  EKG 08/13/2018.  Assessment     ICD-10-CM   1. Permanent atrial fibrillation (HCC)  I48.21   2. Coronary artery disease involving native coronary artery of native heart with other form of angina pectoris ([place])  I25.118   3. Essential hypertension  I10   4. Chest pain of uncertain etiology  Y30.1 EKG 12-Lead    Meds ordered this encounter  Medications  . metoprolol tartrate (LOPRESSOR) 25 MG tablet    Sig: Take 2 tablets (50 mg total) by mouth 2 (two) times daily.    Dispense:  180 tablet    Refill:  2   Medications Discontinued During This Encounter  Medication Reason  . metoprolol tartrate (LOPRESSOR) 25 MG tablet       Recommendations:   Samuel Flores  is a 79 y.o. 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    Patient presents today for urgent visit with complaints of atypical chest pain.  EKG today revealed atrial fibrillation with rapid ventricular response but is without signs of acute ischemia/infarct.  Suspect this to be underlying etiology of patient's intermittent atypical chest pain over the last 1 month.  This discomfort may be related to demand ischemia.  However discussed with  patient regarding further evaluation including echocardiogram, and stress test or cardiac catheterization versus attempted medical management.  Patient prefers to avoid further evaluation at this time, he would like to start with medical management and reevaluate symptoms.  Therefore I recommend increasing metoprolol titrate from 25 to 50 mg twice daily with close follow-up in our office in 2 weeks.  Counseled patient regarding signs and symptoms that warrant urgent evaluation in the emergency department and encourage patient to call our office if he has any worsening of symptoms.  Patient verbalized understanding and agreement.   Patient may benefit from further cardiac ischemia work-up and echocardiogram in the future, but will hold off per patient preference at this time.  We will first evaluate if increased metoprolol dose resolves atypical chest pain.  Blood pressure and lipids are well controlled.  Follow-up in 2 weeks for atypical chest pain.  Patient was seen in collaboration with Dr. [name] and he is in agreement of the plan.     Alethia Berthold, PA-C 10/18/2020, 4:38 PM Office: [phone]

## 2020-10-20 ENCOUNTER — Ambulatory Visit (INDEPENDENT_AMBULATORY_CARE_PROVIDER_SITE_OTHER): Payer: Medicare Other | Admitting: *Deleted

## 2020-10-20 ENCOUNTER — Other Ambulatory Visit: Payer: Self-pay

## 2020-10-20 DIAGNOSIS — Z23 Encounter for immunization: Secondary | ICD-10-CM | POA: Diagnosis not present

## 2020-10-30 NOTE — Progress Notes (Signed)
Primary Physician/Referring:  Alycia Rossetti, MD  Patient ID: Samuel Flores, male    DOB: 11/30/1940, 79 y.o.   MRN: [id number]  No chief complaint on file.  HPI:    Samuel Flores  is a 79 y.o.  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    Patient presents for 2-week follow-up of atypical chest pain.  At last office visit patient opted for medical management of atrial fibrillation with RVR.  At last visit increased metoprolol titrate from 25 to 50 mg twice daily.  Since increasing metoprolol titrate patient has had only 1 brief recurrence of chest pain lasting less than 1 minute and nonexertional.  Patient reports he did yard work last week without issue.  He denies dyspnea, dizziness, syncope, near syncope, orthopnea, PND, and leg swelling.  Patient reports symptoms have essentially resolved and he is presently feeling well.  Past Medical History:  Diagnosis Date  . Alcohol abuse, daily use 08/18/2013  . Alcoholic cirrhosis of liver with ascites ([place]) 08/01/2018  . Arthritis    osteoarthritis  . CAD (coronary artery disease)    Non obstructive  . Cancer [facility]) 1994   prostate  . Diastolic dysfunction Echo 2012   Grade 1  . Essential hypertension 07/15/2013  . Gout   . 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 Hx-TIA (transient ischemic attack) 2011  . Hypercholesteremia   . Hypertension    Past Surgical History:  Procedure Laterality Date  . BURR HOLE FOR SUBDURAL HEMATOMA  03/2011  . CARDIOVERSION N/A 02/20/2017   Procedure: CARDIOVERSION;  Surgeon: Adrian Prows, MD;  Location: [place];  Service: Cardiovascular;  Laterality: N/A;  . CHOLECYSTECTOMY N/A 08/08/2018   Procedure: LAPAROSCOPIC CHOLECYSTECTOMY;  Surgeon: Stark Klein, MD;  Location: [facility];  Service:  General;  Laterality: N/A;  . EYE SURGERY Left 03/2009   cataract  . LEFT HEART CATHETERIZATION WITH CORONARY ANGIOGRAM N/A 01/02/2013   Procedure: LEFT HEART CATHETERIZATION WITH CORONARY ANGIOGRAM;  Surgeon: Laverda Page, MD;  Location: [facility] CATH LAB;  Service: Cardiovascular;  Laterality: N/A;  . PROSTATE SURGERY  1994  . SKIN SURGERY    . SPINE SURGERY     cervical   Social History   Tobacco Use  . Smoking status: Former Smoker    Years: 39.00    Types: Pipe    Quit date: 04/09/2013    Years since quitting: 7.5  . Smokeless tobacco: Never Used  Substance Use Topics  . Alcohol use: Not Currently    Comment: Pt has not had alcohol in 15 months   Marital Status: Married   ROS  Review of Systems  Constitutional: Negative for malaise/fatigue and weight gain.  Cardiovascular: Positive for chest pain (1 breif episode, non-exertional). Negative for claudication, dyspnea on exertion, leg swelling, near-syncope, orthopnea, palpitations, paroxysmal nocturnal dyspnea and syncope.  Respiratory: Negative for shortness of breath and wheezing.   Hematologic/Lymphatic: Does not bruise/bleed easily.  Gastrointestinal: Negative for melena.  Neurological: Negative for dizziness and weakness.   Objective  There were no vitals taken for this visit. There is no height or weight on file to calculate BMI.  Vitals with BMI 10/18/2020 08/16/2020 07/05/2020  Height 5\' 10"  5\' 10"  5\' 10"   Weight 187 lbs 191 lbs 189 lbs  BMI 26.83 76.72 09.47  Systolic  993 570 177  Diastolic 68 63 64  Pulse 939 82 80     Physical Exam Vitals reviewed.  Constitutional:      General: He is not in acute distress.    Appearance: He is well-developed.  HENT:     Head: Normocephalic and atraumatic.  Neck:     Thyroid: No thyromegaly.     Vascular: No JVD.  Cardiovascular:     Rate and Rhythm: Normal rate. Rhythm irregularly irregular.     Pulses: Normal pulses and intact distal pulses.     Heart sounds: S1 normal  and S2 normal. No murmur heard. No gallop. No S3 or S4 sounds.      Comments: S1 is variable, S2 is normal. No JVD, No edema.  Pulmonary:     Effort: Pulmonary effort is normal. No respiratory distress.     Breath sounds: Normal breath sounds. No wheezing, rhonchi or rales.  Musculoskeletal:     Right lower leg: No edema.     Left lower leg: No edema.  Neurological:     Mental Status: He is alert.     Laboratory examination:   Recent Labs    07/05/20 1558  NA 137  K 4.1  CL 103  CO2 24  GLUCOSE 101*  BUN 19  CREATININE 1.09  CALCIUM 9.2   CMP Latest Ref Rng & Units 07/05/2020 09/03/2019 08/20/2018  Glucose 65 - 99 mg/dL 101(H) 101(H) 100(H)  BUN 7 - 25 mg/dL 19 19 11   Creatinine 0.70 - 1.11 mg/dL 1.09 1.09 1.08  Sodium 135 - 146 mmol/L 137 138 138  Potassium 3.5 - 5.3 mmol/L 4.1 4.3 4.6  Chloride 98 - 110 mmol/L 103 103 101  CO2 20 - 32 mmol/L 24 24 25   Calcium 8.6 - 10.3 mg/dL 9.2 9.2 9.2  Total Protein 6.1 - 8.1 g/dL 6.5 6.5 6.5  Total Bilirubin 0.2 - 1.2 mg/dL 0.7 0.8 1.0  Alkaline Phos 38 - 126 U/L - - -  AST 10 - 35 U/L 23 19 26   ALT 9 - 46 U/L 24 17 22    CBC Latest Ref Rng & Units 07/05/2020 09/03/2019 08/20/2018  WBC 3.8 - 10.8 Thousand/uL 12.4(H) 12.7(H) 12.4(H)  Hemoglobin 13.2 - 17.1 g/dL 16.2 15.9 15.1  Hematocrit 38.5 - 50.0 % 46.8 46.7 44.4  Platelets 140 - 400 Thousand/uL 230 228 286   Lipid Panel     Component Value Date/Time   CHOL 139 07/05/2020 1558   TRIG 176 (H) 07/05/2020 1558   HDL 30 (L) 07/05/2020 1558   CHOLHDL 4.6 07/05/2020 1558   VLDL 27 10/24/2016 1004   LDLCALC 82 07/05/2020 1558   HEMOGLOBIN A1C Lab Results  Component Value Date   HGBA1C 5.8 (H) 07/29/2018   MPG 120 07/29/2018   TSH No results for input(s): TSH in the last 8760 hours. Medications   Current Outpatient Medications on File Prior to Visit  Medication Sig Dispense Refill  . allopurinol (ZYLOPRIM) 100 MG tablet TAKE 2 TABLETS BY MOUTH EVERY DAY 180 tablet 2   . diltiazem (CARDIZEM CD) 180 MG 24 hr capsule TAKE 1 CAPSULE BY MOUTH EVERY DAY 90 capsule 1  . folic acid (FOLVITE) 030 MCG tablet Take 800 mcg by mouth daily.    . metoprolol tartrate (LOPRESSOR) 25 MG tablet Take 2 tablets (50 mg total) by mouth 2 (two) times daily. 180 tablet 2  . spironolactone (ALDACTONE) 50 MG tablet TAKE 1 TABLET BY MOUTH EVERY DAY 90 tablet 3  No current facility-administered medications on file prior to visit.     Radiology  No results found.  Cardiac Studies:   Coronary angiogram 01/10/2013: Severe diffuse coronary calcification with scattered 20-30% stenosis. Normal LVEF.   Echocardiogram 08/07/2018: - Left ventricle: The cavity size was normal. Wall thickness was  increased in a pattern of mild LVH. Systolic function was mildly  reduced. The estimated ejection fraction was in the range of 45%   to 50%. Diffuse hypokinesis. The study is not technically sufficient to allow evaluation of LV diastolic function. - Mitral valve: Mildly thickened leaflets . There was mild  regurgitation. - Left atrium: Severely dilated. - Right ventricle: Moderately dilated and moderately hypokinetic. - Right atrium: The atrium was mildly dilated. - Tricuspid valve: There was mild regurgitation. - Pulmonary arteries: PA peak pressure: 40 mm Hg (S) + RAP. - Systemic veins: IVC is not visualized.  EKG:    EKG 11/01/2020: Atrial fibrillation with controlled ventricular response and rate of 73 bpm with frequent PVCs in bigeminal pattern.  Left axis deviation.  Inferior infarct old.  Poor R progression, cannot exclude anteroseptal infarct age-indeterminate.  Nonspecific T wave abnormality.  EKG 10/18/2020: Atrial fibrillation with rapid ventricular response at a rate of 107 bpm with ectopic ventricular complexes.  Left axis.  Inferior infarct old.  Poor R wave progression, cannot exclude anteroseptal infarct old.  Nonspecific T wave abnormality.  Compared to EKG 08/17/2020, rapid  ventricular response and left axis new.  EKG 08/17/2020: Atrial fibrillation with controlled ventricular response at the rate of 82 bpm, inferior infarct old, anteroseptal infarct old.  Nonspecific T abnormality.  No significant change from EKG 06/30/2019   EKG 06/30/2019: Atrial fibrillation with controlled ventricular response at the rate of 88 bpm, left axis deviation, left anterior fascicular block.  Poor R-wave progression, pulmonary disease pattern.  PVCs (2). No significant change from  EKG 08/13/2018.  Assessment     ICD-10-CM   1. Permanent atrial fibrillation (HCC)  I48.21   2. Coronary artery disease involving native coronary artery of native heart with other form of angina pectoris ([place])  I25.118   3. Essential hypertension  I10     No orders of the defined types were placed in this encounter.  There are no discontinued medications.    Recommendations:   Samuel Flores  is a 79 y.o. 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    Patient presents for 2-week follow-up of atypical chest pain.  Patient has had 1 brief episode of chest pain since increasing metoprolol titrate to 50 mg twice daily, otherwise he is asymptomatic.  Symptoms have significantly improved.  Suspect atypical chest pain was due to underlying demand ischemia as patient was previously in atrial fibrillation with RVR.  However EKG today revealed atrial fibrillation with controlled ventricular response and frequent PVCs.  Continue current medical therapy. Counseled patient regarding signs and symptoms that warrant urgent evaluation in the emergency department and encourage patient to call our office if he has any worsening of symptoms.  Patient verbalized understanding and agreement.    Discussed with  patient regarding that he may benefit from further cardiac ischemia work-up and echocardiogram.  However patient prefers not to do further work-up at this time.  Shared decision making determined that we will continue with medical therapy for now  unless symptoms recur.  Follow-up in 6 months, sooner if needed, for hypertension, CAD, atrial fibrillation.    Alethia Berthold, PA-C 11/01/2020, 3:41 PM Office: [phone]

## 2020-11-01 ENCOUNTER — Ambulatory Visit: Payer: Medicare Other | Admitting: Student

## 2020-11-01 ENCOUNTER — Other Ambulatory Visit: Payer: Self-pay

## 2020-11-01 ENCOUNTER — Encounter: Payer: Self-pay | Admitting: Student

## 2020-11-01 VITALS — BP 118/70 | HR 81 | Resp 16 | Ht 70.0 in | Wt 187.0 lb

## 2020-11-01 DIAGNOSIS — I4821 Permanent atrial fibrillation: Secondary | ICD-10-CM | POA: Diagnosis not present

## 2020-11-01 DIAGNOSIS — I10 Essential (primary) hypertension: Secondary | ICD-10-CM | POA: Diagnosis not present

## 2020-11-01 DIAGNOSIS — I25118 Atherosclerotic heart disease of native coronary artery with other forms of angina pectoris: Secondary | ICD-10-CM | POA: Diagnosis not present

## 2020-11-01 MED ORDER — METOPROLOL TARTRATE 50 MG PO TABS
50.0000 mg | ORAL_TABLET | Freq: Two times a day (BID) | ORAL | 3 refills | Status: DC
Start: 2020-11-01 — End: 2021-06-17

## 2020-11-30 ENCOUNTER — Other Ambulatory Visit: Payer: Self-pay | Admitting: Cardiology

## 2020-12-01 ENCOUNTER — Other Ambulatory Visit: Payer: Self-pay | Admitting: Cardiology

## 2021-01-21 DIAGNOSIS — C44321 Squamous cell carcinoma of skin of nose: Secondary | ICD-10-CM | POA: Diagnosis not present

## 2021-01-21 DIAGNOSIS — D485 Neoplasm of uncertain behavior of skin: Secondary | ICD-10-CM | POA: Diagnosis not present

## 2021-01-21 DIAGNOSIS — C44319 Basal cell carcinoma of skin of other parts of face: Secondary | ICD-10-CM | POA: Diagnosis not present

## 2021-03-04 DIAGNOSIS — C44311 Basal cell carcinoma of skin of nose: Secondary | ICD-10-CM | POA: Diagnosis not present

## 2021-03-14 ENCOUNTER — Ambulatory Visit (INDEPENDENT_AMBULATORY_CARE_PROVIDER_SITE_OTHER): Payer: Medicare Other | Admitting: Plastic Surgery

## 2021-03-14 ENCOUNTER — Other Ambulatory Visit: Payer: Self-pay

## 2021-03-14 VITALS — BP 120/66 | HR 72 | Ht 69.0 in | Wt 190.0 lb

## 2021-03-14 DIAGNOSIS — C44311 Basal cell carcinoma of skin of nose: Secondary | ICD-10-CM

## 2021-03-14 NOTE — Progress Notes (Signed)
Referring Provider Alycia Rossetti, MD [address] [place],  [place] [address]   CC:  Chief Complaint  Patient presents with  . consult      Samuel Flores is an 80 y.o. male.  HPI: Patient presents to discuss reconstructive options for upcoming Mohs excision in the right ala.  He was diagnosed with basal cell carcinoma by his dermatologist and referred to Dr. Winifred Olive for Mohs excision.  No Known Allergies  Outpatient Encounter Medications as of 03/14/2021  Medication Sig  . allopurinol (ZYLOPRIM) 100 MG tablet TAKE 2 TABLETS BY MOUTH EVERY DAY  . diltiazem (CARDIZEM CD) 180 MG 24 hr capsule TAKE 1 CAPSULE BY MOUTH EVERY DAY  . folic acid (FOLVITE) 482 MCG tablet Take 800 mcg by mouth daily.  . metoprolol tartrate (LOPRESSOR) 50 MG tablet Take 1 tablet (50 mg total) by mouth 2 (two) times daily.  Marland Kitchen spironolactone (ALDACTONE) 50 MG tablet TAKE 1 TABLET BY MOUTH EVERY DAY   No facility-administered encounter medications on file as of 03/14/2021.     Past Medical History:  Diagnosis Date  . Alcohol abuse, daily use 08/18/2013  . Alcoholic cirrhosis of liver with ascites ([place]) 08/01/2018  . Arthritis    osteoarthritis  . CAD (coronary artery disease)    Non obstructive  . Cancer [facility]) 1994   prostate  . Diastolic dysfunction Echo 2012   Grade 1  . Essential hypertension 07/15/2013  . Gout   . 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 Hx-TIA (transient ischemic attack) 2011  . Hypercholesteremia   . Hypertension     Past Surgical History:  Procedure Laterality Date  . BURR HOLE FOR SUBDURAL HEMATOMA  03/2011  . CARDIOVERSION N/A 02/20/2017   Procedure: CARDIOVERSION;  Surgeon: Adrian Prows, MD;  Location: [place];  Service: Cardiovascular;  Laterality: N/A;  . CHOLECYSTECTOMY N/A 08/08/2018   Procedure: LAPAROSCOPIC CHOLECYSTECTOMY;  Surgeon: Stark Klein, MD;  Location: [facility];  Service: General;  Laterality: N/A;  . EYE SURGERY Left 03/2009   cataract  . LEFT HEART  CATHETERIZATION WITH CORONARY ANGIOGRAM N/A 01/02/2013   Procedure: LEFT HEART CATHETERIZATION WITH CORONARY ANGIOGRAM;  Surgeon: Laverda Page, MD;  Location: [facility] CATH LAB;  Service: Cardiovascular;  Laterality: N/A;  . PROSTATE SURGERY  1994  . SKIN SURGERY    . SPINE SURGERY     cervical    Family History  Problem Relation Age of Onset  . Lung cancer Mother        never smoker  . Prostate cancer Father   . Hypertension Sister   . Hypertension Sister   . Pancreatic cancer Brother     Social History   Social History Narrative  . Not on file     Review of Systems General: Denies fevers, chills, weight loss CV: Denies chest pain, shortness of breath, palpitations  Physical Exam Vitals with BMI 03/14/2021 11/01/2020 10/18/2020  Height 5\' 9"  5\' 10"  5\' 10"   Weight 190 lbs 187 lbs 187 lbs  BMI 28.05 50.03 70.48  Systolic 889 169 450  Diastolic 66 70 68  Pulse 72 81 100    General:  No acute distress,  Alert and oriented, Non-Toxic, Normal speech and affect Examination shows a pearly ulcerative lesion right in the right alar crease.  No obvious scars on the cheek or higher up on the nose but he does have a transverse scar in the right upper forehead.  Assessment/Plan I long discussion with the  patient about his reconstructive options.  I anticipate given the appearance of the mass that it would be a fairly deep excision.  I would not be surprised if it went all the way through his lateral ala full-thickness.  If this were the case had more than likely want to do a flap potentially combined with a cartilage graft.  If it does not up to be a more superficial excision and then a skin graft may be warranted.  I talked him through those procedures and also brought up the potential for a staged procedure in the event that pedicled nasolabial flap flap or potentially forehead flap would be required required.  We went through the risks of the procedures that include bleeding, infection,  damage to surrounding structures or need for additional procedures.  All of his questions were answered we will plan to see him on the day of his excision to plan our reconstructive approach.  Cindra Presume 03/14/2021, 3:00 PM

## 2021-03-17 ENCOUNTER — Encounter: Payer: Self-pay | Admitting: Surgical

## 2021-03-17 NOTE — Progress Notes (Signed)
Surgical Clearance has been received from Dr. Adrian Prows for patient's upcoming surgery reconstruction of right nasal Mohs defect with flap and grafts as necessary with Dr. Claudia Desanctis.   Patient is cleared for surgery.  No medications to hold

## 2021-03-18 ENCOUNTER — Encounter: Payer: Self-pay | Admitting: Family Medicine

## 2021-03-18 ENCOUNTER — Other Ambulatory Visit: Payer: Self-pay

## 2021-03-18 ENCOUNTER — Ambulatory Visit (INDEPENDENT_AMBULATORY_CARE_PROVIDER_SITE_OTHER): Payer: Medicare Other | Admitting: Family Medicine

## 2021-03-18 VITALS — BP 122/68 | HR 96 | Temp 98.7°F | Resp 16 | Ht 69.0 in | Wt 194.0 lb

## 2021-03-18 DIAGNOSIS — I5030 Unspecified diastolic (congestive) heart failure: Secondary | ICD-10-CM | POA: Diagnosis not present

## 2021-03-18 DIAGNOSIS — Z01818 Encounter for other preprocedural examination: Secondary | ICD-10-CM

## 2021-03-18 DIAGNOSIS — I4821 Permanent atrial fibrillation: Secondary | ICD-10-CM | POA: Diagnosis not present

## 2021-03-18 DIAGNOSIS — K7030 Alcoholic cirrhosis of liver without ascites: Secondary | ICD-10-CM

## 2021-03-18 NOTE — Progress Notes (Signed)
Subjective:    Patient ID: Samuel Flores, male    DOB: [birth year], 80 y.o.   MRN: [id number]  HPI Patient is an 80 year old Caucasian male here today to establish care with me.  He previously saw my partner.  She has since retired.  He is requesting medical clearance for his upcoming elective nasal reconstruction due to skin cancer/Mohs surgery.  Past medical history is significant for alcoholic cirrhosis of the liver.  He also has a history of atrial fibrillation.  He also has a history of mildly suppressed ejection fraction congestive heart failure with an EF of 45 to 50% on an echocardiogram in 2019 along with diastolic dysfunction.  I have copied his most recent cardiac studies below: Coronary angiogram 01/10/2013: Severe diffuse coronary calcification with scattered 20-30% stenosis. Normal LVEF.   Echocardiogram 08/07/2018: - Left ventricle: The cavity size was normal. Wall thickness was increased in a pattern of mild LVH. Systolic function was mildly reduced. The estimated ejection fraction was in the range of 45% to 50%. Diffuse hypokinesis. The study is not technically sufficient to allow evaluation of LV diastolic function. - Mitral valve: Mildly thickened leaflets . There was mildregurgitation. - Left atrium: Severely dilated. - Right ventricle: Moderately dilated and moderately hypokinetic. - Right atrium: The atrium was mildly dilated. - Tricuspid valve: There was mild regurgitation. - Pulmonary arteries: PA peak pressure: 40 mm Hg (S) + RAP. - Systemic veins: IVC is not visualized.  Per previous office notes, he is not on anticoagulation due to the cirrhosis of his liver and his increased fall risk.  Apparently in the past he had a subdural hematoma due to falling.  Therefore he was considered high risk for complications due to chronic anticoagulation and his currently only on rate control with metoprolol for his atrial fibrillation.  He is on spironolactone due to his  history of ascites.  Patient states that he quit drinking 4 years ago.  He remains completely sober.  He denies any melena or hematochezia or hematemesis.  Since his cardiologist increased his metoprolol, he denies any chest pain.  He denies any palpitations.  He denies any shortness of breath or dyspnea on exertion although he admits that he is relatively sedentary.  He states that he still push mows his lawn and he is able to do this without angina.  He denies any abdominal pain.  He denies any confusion or encephalopathy.  He has a basal cell cancer on his right nostril.  They are going to take this off with Mohs surgery.  He will then have an elective repair of his right nostril with the plastic surgeon.  The first surgery will be done with local anesthesia.  The second surgery may require general anesthesia. Past Medical History:  Diagnosis Date  . Alcohol abuse, daily use 08/18/2013  . Alcoholic cirrhosis of liver with ascites ([place]) 08/01/2018  . Arthritis    osteoarthritis  . CAD (coronary artery disease)    Non obstructive  . Cancer [facility]) 1994   prostate  . Diastolic dysfunction Echo 2012   Grade 1  . Essential hypertension 07/15/2013  . Gout   . 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 Hx-TIA (transient ischemic attack) 2011  . Hypercholesteremia   . Hypertension    Past Surgical History:  Procedure Laterality Date  . BURR HOLE FOR SUBDURAL HEMATOMA  03/2011  . CARDIOVERSION N/A 02/20/2017   Procedure: CARDIOVERSION;  Surgeon: Adrian Prows, MD;  Location: [place];  Service: Cardiovascular;  Laterality: N/A;  . CHOLECYSTECTOMY N/A 08/08/2018   Procedure: LAPAROSCOPIC CHOLECYSTECTOMY;  Surgeon: Stark Klein, MD;  [facility];  Service: General;  Laterality: N/A;  . EYE SURGERY Left 03/2009   cataract  . LEFT HEART CATHETERIZATION WITH CORONARY ANGIOGRAM N/A 01/02/2013   Procedure: LEFT HEART CATHETERIZATION WITH CORONARY ANGIOGRAM;  Surgeon: Laverda Page, MD;  Location: [facility] CATH  LAB;  Service: Cardiovascular;  Laterality: N/A;  . PROSTATE SURGERY  1994  . SKIN SURGERY    . SPINE SURGERY     cervical   Current Outpatient Medications on File Prior to Visit  Medication Sig Dispense Refill  . allopurinol (ZYLOPRIM) 100 MG tablet TAKE 2 TABLETS BY MOUTH EVERY DAY 180 tablet 2  . diltiazem (CARDIZEM CD) 180 MG 24 hr capsule TAKE 1 CAPSULE BY MOUTH EVERY DAY 90 capsule 1  . folic acid (FOLVITE) 267 MCG tablet Take 800 mcg by mouth daily.    . metoprolol tartrate (LOPRESSOR) 50 MG tablet Take 1 tablet (50 mg total) by mouth 2 (two) times daily. 180 tablet 3  . spironolactone (ALDACTONE) 50 MG tablet TAKE 1 TABLET BY MOUTH EVERY DAY 90 tablet 3   No current facility-administered medications on file prior to visit.   No Known Allergies Social History   Socioeconomic History  . Marital status: Married    Spouse name: Not on file  . Number of children: 2  . Years of education: Not on file  . Highest education level: Not on file  Occupational History  . 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 Smoking status: Former Smoker    Years: 39.00    Types: Pipe    Quit date: 04/09/2013    Years since quitting: 7.9  . Smokeless tobacco: Never Used  Vaping Use  . Vaping Use: Never used  Substance and Sexual Activity  . Alcohol use: Not Currently    Comment: Pt has not had alcohol in 15 months   . Drug use: No  . Sexual activity: Never  Other Topics Concern  . Not on file  Social History Narrative  . Not on file   Social Determinants of Health   Financial Resource Strain: Not on file  Food Insecurity: Not on file  Transportation Needs: Not on file  Physical Activity: Not on file  Stress: Not on file  Social Connections: Not on file  Intimate Partner Violence: Not on file      Review of Systems  All other systems reviewed and are negative.      Objective:   Physical Exam Vitals reviewed.  Constitutional:       General: He is not in acute distress.    Appearance: He is obese. He is not ill-appearing or toxic-appearing.  HENT:     Head: Normocephalic and atraumatic.  Cardiovascular:     Rate and Rhythm: Normal rate. Rhythm irregular.     Heart sounds: Normal heart sounds. No murmur heard. No friction rub. No gallop.   Pulmonary:     Effort: Pulmonary effort is normal. No respiratory distress.     Breath sounds: Normal breath sounds. No stridor. No wheezing or rhonchi.  Abdominal:     General: Bowel sounds are normal. There is no distension.     Palpations: Abdomen is soft.     Tenderness: There is no abdominal tenderness.  Musculoskeletal:     Right lower leg: No edema.     Left lower  leg: No edema.  Skin:    Findings: No erythema or rash.  Neurological:     General: No focal deficit present.     Mental Status: He is alert and oriented to person, place, and time.     Cranial Nerves: No cranial nerve deficit.     Motor: No weakness.  Psychiatric:        Mood and Affect: Mood normal.        Behavior: Behavior normal.        Thought Content: Thought content normal.           Assessment & Plan:  Pre-operative clearance - Plan: EKG 12-Lead  Permanent atrial fibrillation (HCC)  CHF with left ventricular diastolic dysfunction, NYHA class 2 (HCC)  Alcoholic cirrhosis of liver without ascites ([place]) - Plan: CBC with Differential/Platelet, COMPLETE METABOLIC PANEL WITH GFR, PT with INR/Fingerstick, APTT  Biggest concern I have for surgery would be possible bleeding due to underlying cirrhosis.  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  I will also check a PT and PTT to evaluate the severity of his cirrhosis and to ascertain his bleeding risk.  Blood pressure (122/68) is controlled today.  Stroke risk is increased due to the fact he is not on anticoagulation.  Patient continues to refrain from alcohol.  EKG today shows atrial  fibrillation with frequent PVCs.  However there is no evidence of ischemia or infarction.  Patient's heart rate is controlled.  Assuming that his bleeding risk is not too high, I believe the patient is medically cleared to proceed with his elective plastic surgery given that is a low risk procedure.  Await the results of his lab work.

## 2021-03-19 LAB — COMPLETE METABOLIC PANEL WITH GFR
AG Ratio: 1.7 (calc) (ref 1.0–2.5)
ALT: 12 U/L (ref 9–46)
AST: 18 U/L (ref 10–35)
Albumin: 4 g/dL (ref 3.6–5.1)
Alkaline phosphatase (APISO): 97 U/L (ref 35–144)
BUN/Creatinine Ratio: 16 (calc) (ref 6–22)
BUN: 19 mg/dL (ref 7–25)
CO2: 28 mmol/L (ref 20–32)
Calcium: 9.3 mg/dL (ref 8.6–10.3)
Chloride: 100 mmol/L (ref 98–110)
Creat: 1.19 mg/dL — ABNORMAL HIGH (ref 0.70–1.11)
GFR, Est African American: 66 mL/min/{1.73_m2} (ref >=60)
GFR, Est Non African American: 57 mL/min/{1.73_m2} — ABNORMAL LOW (ref >=60)
Globulin: 2.4 g/dL (calc) (ref 1.9–3.7)
Glucose, Bld: 106 mg/dL — ABNORMAL HIGH (ref 65–99)
Potassium: 4.8 mmol/L (ref 3.5–5.3)
Sodium: 139 mmol/L (ref 135–146)
Total Bilirubin: 0.6 mg/dL (ref 0.2–1.2)
Total Protein: 6.4 g/dL (ref 6.1–8.1)

## 2021-03-19 LAB — CBC WITH DIFFERENTIAL/PLATELET
Absolute Monocytes: 759 cells/uL (ref 200–950)
Basophils Absolute: 52 cells/uL (ref 0–200)
Basophils Relative: 0.5 %
Eosinophils Absolute: 229 cells/uL (ref 15–500)
Eosinophils Relative: 2.2 %
HCT: 47.4 % (ref 38.5–50.0)
Hemoglobin: 15.9 g/dL (ref 13.2–17.1)
Lymphs Abs: 1539 cells/uL (ref 850–3900)
MCH: 31.5 pg (ref 27.0–33.0)
MCHC: 33.5 g/dL (ref 32.0–36.0)
MCV: 94 fL (ref 80.0–100.0)
MPV: 10.4 fL (ref 7.5–12.5)
Monocytes Relative: 7.3 %
Neutro Abs: 7821 cells/uL — ABNORMAL HIGH (ref 1500–7800)
Neutrophils Relative %: 75.2 %
Platelets: 245 10*3/uL (ref 140–400)
RBC: 5.04 10*6/uL (ref 4.20–5.80)
RDW: 13.1 % (ref 11.0–15.0)
Total Lymphocyte: 14.8 %
WBC: 10.4 10*3/uL (ref 3.8–10.8)

## 2021-03-19 LAB — APTT: aPTT: 26 s (ref 23–32)

## 2021-03-19 LAB — PROTIME-INR
INR: 1
Prothrombin Time: 10.2 s (ref 9.0–11.5)

## 2021-03-24 ENCOUNTER — Encounter: Payer: Self-pay | Admitting: Surgical

## 2021-03-24 ENCOUNTER — Other Ambulatory Visit: Payer: Self-pay | Admitting: Family Medicine

## 2021-03-24 NOTE — Progress Notes (Signed)
Surgical Clearance has been received from Dr. Vic Blackbird for patient's upcoming nasal reconstruction surgery with Dr. Claudia Desanctis on 04/13/21 . Medications to hold prior to surgery spironolacton (Stop taking: day of surgery Begin retaking day after surgery)    Pt must take diltiazem and metoprolol that day.

## 2021-04-04 ENCOUNTER — Encounter: Payer: Medicare Other | Admitting: Family Medicine

## 2021-04-13 ENCOUNTER — Other Ambulatory Visit: Payer: Self-pay

## 2021-04-13 ENCOUNTER — Telehealth: Payer: Self-pay

## 2021-04-13 ENCOUNTER — Ambulatory Visit (INDEPENDENT_AMBULATORY_CARE_PROVIDER_SITE_OTHER): Payer: Medicare Other | Admitting: Plastic Surgery

## 2021-04-13 DIAGNOSIS — C44311 Basal cell carcinoma of skin of nose: Secondary | ICD-10-CM

## 2021-04-13 NOTE — H&P (View-Only) (Signed)
   Referring Provider Alycia Rossetti, MD [address] [place],  [place] [address]   CC:  Chief Complaint  Patient presents with  . Follow-up      Samuel Flores is an 80 y.o. male.  HPI: Patient presents to discuss his options for reconstruction after Mohs excision.  He had a right alar crease basal cell carcinoma.  He had his Mohs excision today and is here to be evaluated.  Review of Systems General: Denies fevers and chills  Physical Exam Vitals with BMI 03/18/2021 03/14/2021 11/01/2020  Height 5\' 9"  5\' 9"  5\' 10"   Weight 194 lbs 190 lbs 187 lbs  BMI 28.64 03.00 92.33  Systolic 007 622 633  Diastolic 68 66 70  Pulse 96 72 81    General:  No acute distress,  Alert and oriented, Non-Toxic, Normal speech and affect Examination shows a large defect through and through the right ala.  The majority of the right ala is missing along with the lateral lining the alar portion of the nose.  The columella is preserved but the defect does go onto the cheek and upper lip.  There is no obvious scars on the forehead.  There is a suspicious lesion in the lower lateral right cheek near the angle of the mandible.  Assessment/Plan Patient presents with a complex right nasal defect.  I recommended reconstruction with forehead flap to wrap around a cartilage graft.  More than likely all skin graft the upper lip and potentially cheek portions of the defect depending on the size of the flap.  He does have a preserved hairline which will limit the amount of skin that I can take but it does appear that I will reach to reconstruct the ala.  I showed him the pictures of his current defect and showed him pictures of past forehead flaps that I have done to give him as clear and obvious possible.  I did explain that this was a staged procedure and we could either do the second stage in the office or in the operating room.  We discussed the risks of the procedure that include bleeding, infection, damage to  surrounding structures and need for additional procedures.  All of his questions were answered and we will plan to move forward.  Cindra Presume 04/13/2021, 2:14 PM

## 2021-04-13 NOTE — Progress Notes (Signed)
IB message sent to PA- Roetta Sessions requesting medical/cardiac clearance notes.

## 2021-04-13 NOTE — Telephone Encounter (Signed)
Call to pt for the following: Informed him of the surgical clearances from Dr. [name] & Dr. Buelah Manis. Both of his MD's cleared him for surgery- Medications to hold are as follows: Stop taking Spironolacton on day of surgery- then resume taking day after surgery He is to take Diltazem & Metoprolol on day of surgery. Patient voiced understanding of the above orders. He is reminded to call for any questions or concerns.

## 2021-04-13 NOTE — Progress Notes (Signed)
   Referring Provider Alycia Rossetti, MD [address] [place],  [place] [address]   CC:  Chief Complaint  Patient presents with  . Follow-up      Samuel Flores is an 80 y.o. male.  HPI: Patient presents to discuss his options for reconstruction after Mohs excision.  He had a right alar crease basal cell carcinoma.  He had his Mohs excision today and is here to be evaluated.  Review of Systems General: Denies fevers and chills  Physical Exam Vitals with BMI 03/18/2021 03/14/2021 11/01/2020  Height 5\' 9"  5\' 9"  5\' 10"   Weight 194 lbs 190 lbs 187 lbs  BMI 28.64 54.98 26.41  Systolic 583 094 076  Diastolic 68 66 70  Pulse 96 72 81    General:  No acute distress,  Alert and oriented, Non-Toxic, Normal speech and affect Examination shows a large defect through and through the right ala.  The majority of the right ala is missing along with the lateral lining the alar portion of the nose.  The columella is preserved but the defect does go onto the cheek and upper lip.  There is no obvious scars on the forehead.  There is a suspicious lesion in the lower lateral right cheek near the angle of the mandible.  Assessment/Plan Patient presents with a complex right nasal defect.  I recommended reconstruction with forehead flap to wrap around a cartilage graft.  More than likely all skin graft the upper lip and potentially cheek portions of the defect depending on the size of the flap.  He does have a preserved hairline which will limit the amount of skin that I can take but it does appear that I will reach to reconstruct the ala.  I showed him the pictures of his current defect and showed him pictures of past forehead flaps that I have done to give him as clear and obvious possible.  I did explain that this was a staged procedure and we could either do the second stage in the office or in the operating room.  We discussed the risks of the procedure that include bleeding, infection, damage to  surrounding structures and need for additional procedures.  All of his questions were answered and we will plan to move forward.  Cindra Presume 04/13/2021, 2:14 PM

## 2021-04-15 ENCOUNTER — Encounter (HOSPITAL_COMMUNITY): Payer: Self-pay | Admitting: Plastic Surgery

## 2021-04-15 ENCOUNTER — Other Ambulatory Visit: Payer: Self-pay

## 2021-04-15 NOTE — Progress Notes (Signed)
Medical and cardiac clearance in pt's chart

## 2021-04-15 NOTE — Progress Notes (Signed)
Spoke with pt for pre-op call. Pt has hx of non obstructive CAD. Dr. [name] is his cardiologist. Clearance note in chart. Pt denies any recent chest pain or shortness of breath. Pt is not diabetic.   Pt's surgery is scheduled as ambulatory so no Covid test is required prior to surgery. Pt denies any Covid symptoms.

## 2021-04-18 ENCOUNTER — Encounter (HOSPITAL_COMMUNITY)
Admission: RE | Disposition: A | Discharge status: Home / Self Care | Payer: Self-pay | Source: Home / Self Care | Attending: Plastic Surgery

## 2021-04-18 ENCOUNTER — Ambulatory Visit (HOSPITAL_COMMUNITY)
Admission: RE | Admit: 2021-04-18 | Discharge: 2021-04-18 | Disposition: A | Discharge status: Home / Self Care | Payer: Medicare Other | Attending: Plastic Surgery | Admitting: Plastic Surgery

## 2021-04-18 ENCOUNTER — Ambulatory Visit (HOSPITAL_COMMUNITY): Payer: Medicare Other | Admitting: Anesthesiology

## 2021-04-18 ENCOUNTER — Telehealth: Payer: Self-pay | Admitting: Plastic Surgery

## 2021-04-18 ENCOUNTER — Encounter (HOSPITAL_COMMUNITY): Payer: Self-pay | Admitting: Plastic Surgery

## 2021-04-18 ENCOUNTER — Other Ambulatory Visit: Payer: Self-pay

## 2021-04-18 DIAGNOSIS — R7989 Other specified abnormal findings of blood chemistry: Secondary | ICD-10-CM | POA: Diagnosis not present

## 2021-04-18 DIAGNOSIS — Z87891 Personal history of nicotine dependence: Secondary | ICD-10-CM | POA: Diagnosis not present

## 2021-04-18 DIAGNOSIS — M950 Acquired deformity of nose: Secondary | ICD-10-CM | POA: Diagnosis not present

## 2021-04-18 DIAGNOSIS — Z85828 Personal history of other malignant neoplasm of skin: Secondary | ICD-10-CM | POA: Insufficient documentation

## 2021-04-18 DIAGNOSIS — I509 Heart failure, unspecified: Secondary | ICD-10-CM | POA: Diagnosis not present

## 2021-04-18 DIAGNOSIS — I110 Hypertensive heart disease with heart failure: Secondary | ICD-10-CM | POA: Insufficient documentation

## 2021-04-18 DIAGNOSIS — E7800 Pure hypercholesterolemia, unspecified: Secondary | ICD-10-CM | POA: Diagnosis not present

## 2021-04-18 DIAGNOSIS — I4891 Unspecified atrial fibrillation: Secondary | ICD-10-CM | POA: Diagnosis not present

## 2021-04-18 DIAGNOSIS — C44311 Basal cell carcinoma of skin of nose: Secondary | ICD-10-CM | POA: Diagnosis not present

## 2021-04-18 HISTORY — DX: Heart failure, unspecified: I50.9

## 2021-04-18 HISTORY — DX: Cerebral infarction, unspecified: I63.9

## 2021-04-18 HISTORY — PX: NASAL FLAP ROTATION: SHX5366

## 2021-04-18 SURGERY — CREATION, FLAP, PEDICLED ROTATION, FOREHEAD AND NOSE
Anesthesia: General | Site: Nose | Laterality: Right

## 2021-04-18 MED ORDER — PROPOFOL 10 MG/ML IV BOLUS
INTRAVENOUS | Status: DC | PRN
  Administered 2021-04-18: 130 mg via INTRAVENOUS

## 2021-04-18 MED ORDER — PROPOFOL 10 MG/ML IV BOLUS
INTRAVENOUS | Status: AC
  Filled 2021-04-18: qty 20

## 2021-04-18 MED ORDER — ONDANSETRON HCL 4 MG PO TABS: 4.0000 mg | ORAL_TABLET | Freq: Three times a day (TID) | ORAL | 0 refills | Status: DC | PRN

## 2021-04-18 MED ORDER — LIDOCAINE HCL (CARDIAC) PF 100 MG/5ML IV SOSY
PREFILLED_SYRINGE | INTRAVENOUS | Status: DC | PRN
  Administered 2021-04-18: 100 mg via INTRAVENOUS

## 2021-04-18 MED ORDER — PHENYLEPHRINE HCL (PRESSORS) 10 MG/ML IV SOLN
INTRAVENOUS | Status: DC | PRN
  Administered 2021-04-18 (×2): 80 ug via INTRAVENOUS
  Administered 2021-04-18: 120 ug via INTRAVENOUS

## 2021-04-18 MED ORDER — OXYCODONE HCL 5 MG/5ML PO SOLN: 5.0000 mg | Freq: Once | ORAL | Status: DC | PRN

## 2021-04-18 MED ORDER — LIDOCAINE-EPINEPHRINE 0.5 %-1:200000 IJ SOLN
INTRAMUSCULAR | Status: AC
  Filled 2021-04-18: qty 1

## 2021-04-18 MED ORDER — BACITRACIN ZINC 500 UNIT/GM EX OINT
TOPICAL_OINTMENT | CUTANEOUS | Status: DC | PRN
  Administered 2021-04-18: 1 via TOPICAL

## 2021-04-18 MED ORDER — ONDANSETRON HCL 4 MG/2ML IJ SOLN: 4.0000 mg | Freq: Once | INTRAMUSCULAR | Status: DC | PRN

## 2021-04-18 MED ORDER — HYDROCODONE-ACETAMINOPHEN 5-325 MG PO TABS: 1.0000 | ORAL_TABLET | Freq: Four times a day (QID) | ORAL | 0 refills | Status: AC | PRN

## 2021-04-18 MED ORDER — DEXAMETHASONE SODIUM PHOSPHATE 4 MG/ML IJ SOLN
INTRAMUSCULAR | Status: DC | PRN
  Administered 2021-04-18: 4 mg via INTRAVENOUS

## 2021-04-18 MED ORDER — FENTANYL CITRATE (PF) 100 MCG/2ML IJ SOLN: 25.0000 ug | INTRAMUSCULAR | Status: DC | PRN

## 2021-04-18 MED ORDER — LIDOCAINE-EPINEPHRINE 1 %-1:100000 IJ SOLN
INTRAMUSCULAR | Status: AC
  Filled 2021-04-18: qty 1

## 2021-04-18 MED ORDER — ROCURONIUM BROMIDE 10 MG/ML (PF) SYRINGE
PREFILLED_SYRINGE | INTRAVENOUS | Status: DC | PRN
  Administered 2021-04-18: 20 mg via INTRAVENOUS

## 2021-04-18 MED ORDER — LIDOCAINE 2% (20 MG/ML) 5 ML SYRINGE
INTRAMUSCULAR | Status: AC
  Filled 2021-04-18: qty 5

## 2021-04-18 MED ORDER — MIDAZOLAM HCL 2 MG/2ML IJ SOLN
INTRAMUSCULAR | Status: AC
  Filled 2021-04-18: qty 2

## 2021-04-18 MED ORDER — ARTIFICIAL TEARS OPHTHALMIC OINT
TOPICAL_OINTMENT | OPHTHALMIC | Status: AC
  Filled 2021-04-18: qty 3.5

## 2021-04-18 MED ORDER — SUCCINYLCHOLINE CHLORIDE 20 MG/ML IJ SOLN
INTRAMUSCULAR | Status: DC | PRN
  Administered 2021-04-18: 80 mg via INTRAVENOUS

## 2021-04-18 MED ORDER — ONDANSETRON HCL 4 MG/2ML IJ SOLN
INTRAMUSCULAR | Status: AC
  Filled 2021-04-18: qty 2

## 2021-04-18 MED ORDER — LIDOCAINE-EPINEPHRINE 1 %-1:100000 IJ SOLN
INTRAMUSCULAR | Status: DC | PRN
  Administered 2021-04-18: 40 mL

## 2021-04-18 MED ORDER — OXYCODONE HCL 5 MG PO TABS: 5.0000 mg | ORAL_TABLET | Freq: Once | ORAL | Status: DC | PRN

## 2021-04-18 MED ORDER — LACTATED RINGERS IV SOLN: INTRAVENOUS | Status: DC

## 2021-04-18 MED ORDER — AMISULPRIDE (ANTIEMETIC) 5 MG/2ML IV SOLN
10.0000 mg | Freq: Once | INTRAVENOUS | Status: AC | PRN
  Administered 2021-04-18: 10 mg via INTRAVENOUS

## 2021-04-18 MED ORDER — SUCCINYLCHOLINE CHLORIDE 200 MG/10ML IV SOSY
PREFILLED_SYRINGE | INTRAVENOUS | Status: AC
  Filled 2021-04-18: qty 10

## 2021-04-18 MED ORDER — HEMOSTATIC AGENTS (NO CHARGE) OPTIME
TOPICAL | Status: DC | PRN
  Administered 2021-04-18: 1 via TOPICAL

## 2021-04-18 MED ORDER — ARTIFICIAL TEARS OPHTHALMIC OINT
TOPICAL_OINTMENT | OPHTHALMIC | Status: DC | PRN
  Administered 2021-04-18: 1 via OPHTHALMIC

## 2021-04-18 MED ORDER — PHENYLEPHRINE HCL-NACL 10-0.9 MG/250ML-% IV SOLN
INTRAVENOUS | Status: DC | PRN
  Administered 2021-04-18: 50 ug/min via INTRAVENOUS

## 2021-04-18 MED ORDER — ONDANSETRON HCL 4 MG/2ML IJ SOLN
INTRAMUSCULAR | Status: DC | PRN
  Administered 2021-04-18: 4 mg via INTRAVENOUS

## 2021-04-18 MED ORDER — SUGAMMADEX SODIUM 200 MG/2ML IV SOLN
INTRAVENOUS | Status: DC | PRN
  Administered 2021-04-18: 150 mg via INTRAVENOUS

## 2021-04-18 MED ORDER — FENTANYL CITRATE (PF) 100 MCG/2ML IJ SOLN
INTRAMUSCULAR | Status: DC | PRN
  Administered 2021-04-18: 100 ug via INTRAVENOUS
  Administered 2021-04-18 (×2): 25 ug via INTRAVENOUS

## 2021-04-18 MED ORDER — DEXAMETHASONE SODIUM PHOSPHATE 10 MG/ML IJ SOLN
INTRAMUSCULAR | Status: AC
  Filled 2021-04-18: qty 1

## 2021-04-18 MED ORDER — LIDOCAINE-EPINEPHRINE (PF) 1 %-1:200000 IJ SOLN
INTRAMUSCULAR | Status: AC
  Filled 2021-04-18: qty 30

## 2021-04-18 MED ORDER — ORAL CARE MOUTH RINSE: 15.0000 mL | Freq: Once | OROMUCOSAL | Status: AC

## 2021-04-18 MED ORDER — CHLORHEXIDINE GLUCONATE 0.12 % MT SOLN
15.0000 mL | Freq: Once | OROMUCOSAL | Status: AC
  Administered 2021-04-18: 15 mL via OROMUCOSAL
  Filled 2021-04-18: qty 15

## 2021-04-18 MED ORDER — CEFAZOLIN SODIUM-DEXTROSE 2-4 GM/100ML-% IV SOLN
2.0000 g | INTRAVENOUS | Status: AC
  Administered 2021-04-18: 2 g via INTRAVENOUS
  Filled 2021-04-18: qty 100

## 2021-04-18 MED ORDER — AMISULPRIDE (ANTIEMETIC) 5 MG/2ML IV SOLN
INTRAVENOUS | Status: AC
  Filled 2021-04-18: qty 4

## 2021-04-18 MED ORDER — FENTANYL CITRATE (PF) 250 MCG/5ML IJ SOLN
INTRAMUSCULAR | Status: AC
  Filled 2021-04-18: qty 5

## 2021-04-18 MED ORDER — ROCURONIUM BROMIDE 10 MG/ML (PF) SYRINGE
PREFILLED_SYRINGE | INTRAVENOUS | Status: AC
  Filled 2021-04-18: qty 10

## 2021-04-18 MED ORDER — BACITRACIN ZINC 500 UNIT/GM EX OINT
TOPICAL_OINTMENT | CUTANEOUS | Status: AC
  Filled 2021-04-18: qty 28.35

## 2021-04-18 MED ORDER — PHENYLEPHRINE 40 MCG/ML (10ML) SYRINGE FOR IV PUSH (FOR BLOOD PRESSURE SUPPORT)
PREFILLED_SYRINGE | INTRAVENOUS | Status: AC
  Filled 2021-04-18: qty 10

## 2021-04-18 SURGICAL SUPPLY — 48 items
APL SWBSTK 6 STRL LF DISP (MISCELLANEOUS) ×1
APPLICATOR COTTON TIP 6 STRL (MISCELLANEOUS) ×2 IMPLANT
APPLICATOR COTTON TIP 6IN STRL (MISCELLANEOUS) ×2
BALL CTTN LRG ABS STRL LF (GAUZE/BANDAGES/DRESSINGS)
BLADE SURG 15 STRL LF DISP TIS (BLADE) ×1 IMPLANT
BLADE SURG 15 STRL SS (BLADE) ×2
BNDG EYE OVAL (GAUZE/BANDAGES/DRESSINGS) IMPLANT
BNDG GAUZE ELAST 4 BULKY (GAUZE/BANDAGES/DRESSINGS) ×2 IMPLANT
CANISTER SUCT 3000ML PPV (MISCELLANEOUS) IMPLANT
COTTONBALL LRG STERILE PKG (GAUZE/BANDAGES/DRESSINGS) ×1 IMPLANT
COVER WAND RF STERILE (DRAPES) ×2 IMPLANT
DECANTER SPIKE VIAL GLASS SM (MISCELLANEOUS) ×2 IMPLANT
ELECT NDL TIP 2.8 STRL (NEEDLE) ×1 IMPLANT
ELECT NEEDLE TIP 2.8 STRL (NEEDLE) ×2 IMPLANT
ELECT REM PT RETURN 9FT ADLT (ELECTROSURGICAL) ×2
ELECTRODE REM PT RTRN 9FT ADLT (ELECTROSURGICAL) ×1 IMPLANT
GAUZE 4X4 16PLY RFD (DISPOSABLE) ×2 IMPLANT
GAUZE SPONGE 4X4 12PLY STRL (GAUZE/BANDAGES/DRESSINGS) ×2 IMPLANT
GAUZE XEROFORM 5X9 LF (GAUZE/BANDAGES/DRESSINGS) ×1 IMPLANT
GEL ULTRASOUND 8.5O AQUASONIC (MISCELLANEOUS) ×1 IMPLANT
GLOVE BIO SURGEON STRL SZ 6.5 (GLOVE) ×2 IMPLANT
GOWN STRL REUS W/ TWL LRG LVL3 (GOWN DISPOSABLE) ×2 IMPLANT
GOWN STRL REUS W/TWL LRG LVL3 (GOWN DISPOSABLE) ×4
HEMOSTAT SURGICEL 2X14 (HEMOSTASIS) ×1 IMPLANT
KIT BASIN OR (CUSTOM PROCEDURE TRAY) ×2 IMPLANT
NDL HYPO 25GX1X1/2 BEV (NEEDLE) ×1 IMPLANT
NDL PRECISIONGLIDE 27X1.5 (NEEDLE) ×1 IMPLANT
NEEDLE HYPO 25GX1X1/2 BEV (NEEDLE) ×2 IMPLANT
NEEDLE PRECISIONGLIDE 27X1.5 (NEEDLE) ×2 IMPLANT
NS IRRIG 1000ML POUR BTL (IV SOLUTION) ×2 IMPLANT
PACK EENT II TURBAN DRAPE (CUSTOM PROCEDURE TRAY) ×2 IMPLANT
PENCIL BUTTON HOLSTER BLD 10FT (ELECTRODE) ×2 IMPLANT
SPONGE LAP 18X18 RF (DISPOSABLE) ×1 IMPLANT
STAPLER VISISTAT 35W (STAPLE) ×1 IMPLANT
SUCTION FRAZIER HANDLE 10FR (MISCELLANEOUS) ×2
SUCTION TUBE FRAZIER 10FR DISP (MISCELLANEOUS) IMPLANT
SUT ETHILON 4 0 PS 2 18 (SUTURE) ×1 IMPLANT
SUT ETHILON 5 0 P 3 18 (SUTURE) ×2
SUT ETHILON 5 0 PS 2 18 (SUTURE) IMPLANT
SUT MON AB 4-0 PS1 27 (SUTURE) ×3 IMPLANT
SUT MON AB 5-0 PS2 18 (SUTURE) ×2 IMPLANT
SUT NYLON ETHILON 5-0 P-3 1X18 (SUTURE) IMPLANT
SUT PDS AB 3-0 SH 27 (SUTURE) ×1 IMPLANT
SUT PDS AB 4-0 RB1 27 (SUTURE) ×1 IMPLANT
SYR BULB EAR ULCER 3OZ GRN STR (SYRINGE) ×2 IMPLANT
SYR CONTROL 10ML LL (SYRINGE) ×2 IMPLANT
TOWEL GREEN STERILE FF (TOWEL DISPOSABLE) ×4 IMPLANT
TUBE CONNECTING 20X1/4 (TUBING) IMPLANT

## 2021-04-18 NOTE — Discharge Instructions (Addendum)
Activity As tolerated: No getting the face wet. You can use baby wipes to clean areas of your face that were not operated on. You can shower, but avoid any water contact with face. NO driving No heavy activities  Diet: Regular. Drink plenty of fluids (Water. Avoid sugar/sodas/diet sodas)  Wound Care: Keep dressing clean & dry. When lying down, keep head elevated on 2-3 pillows or back-rest. Sleep in a reclined position. Apply ointment for forehead incision 1-2x daily.  Special Instructions: Call Doctor if any unusual problems occur such as pain, excessive bleeding, unrelieved Nausea/vomiting, Fever &/or chills Call with questions  Follow-up appointment: Scheduled for next week.

## 2021-04-18 NOTE — Op Note (Signed)
Operative Note   DATE OF OPERATION: 04/18/2021  SURGICAL DEPARTMENT: Plastic Surgery  PREOPERATIVE DIAGNOSES: Right nasal Mohs surgery defect  POSTOPERATIVE DIAGNOSES:  same  PROCEDURE: 1.  Surgical preparation for coverage of right nasal Mohs surgery defect totaling 3 x 3 cm 2.  Harvest and application of right ear conchal cartilage graft 3.  Right nasal reconstruction with pedicled paramedian forehead flap 4.  Complex closure right upper lip totaling 3 cm in length  SURGEON: Talmadge Coventry, MD  ASSISTANT: [place], RNFA The advanced practice practitioner (APP) assisted throughout the case.  The APP was essential in retraction and counter traction when needed to make the case progress smoothly.  This retraction and assistance made it possible to see the tissue plans for the procedure.  The assistance was needed for blood control, tissue re-approximation and assisted with closure of the incision site.  ANESTHESIA:  General.   COMPLICATIONS: None.   INDICATIONS FOR PROCEDURE:  The patient, Samuel Flores is a 80 y.o. 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  This was through and through encompassing the entirety of the right alar subunit extending onto the right cheek and right upper lip.  Superiorly and extended to about the level of the bony dorsum. MRN: [id number]  CONSENT:  Informed consent was obtained directly from the patient. Risks, benefits and alternatives were fully discussed. Specific risks including but not limited to bleeding, infection, hematoma, seroma, scarring, pain, contracture, asymmetry, wound healing problems, and need for further surgery were all discussed. The patient did have an ample opportunity to have questions answered to satisfaction.   DESCRIPTION OF PROCEDURE:  The patient was taken to the operating room. SCDs were placed and antibiotics were given.  General anesthesia was  administered.  The patient's operative site was prepped and draped in a sterile fashion. A time out was performed and all information was confirmed to be correct.  I started by using a Doppler to identify the supratrochlear vessel on the right side.  This was identified and at 1.7 cm pedicle was planned centered around.  This was extended laterally and superiorly to plan out the entirety of the paramedian forehead forehead flap.  He did have a somewhat low forehead so I had to design this obliquely.  At this point that area along with the nose and posterior aspect of the right ear were infiltrated with local anesthetic and this was given time to work.  I started by performing a debridement of the nasal wound.  This was done with the bevel of a 15 blade.  Any nonviable tissue was excised.  This included skin and subcutaneous tissue.  This left a healthy wound bed behind.  As stated before the entirety of the skin, support structures, and lining were excised for the right alar subunit extending superiorly to the level of the bony dorsum.  The columella was largely preserved.  At this point I turned my attention to the posterior aspect of the right ear.  A incision was made in the posterior auricular sulcus with a 15 blade I dissected down to the cartilage.  The skin was elevated off the cartilage and a relatively large conchal cartilage graft was harvested with a 15 blade.  Meticulous hemostasis was obtained and the wound was closed with combination of interrupted buried 4-0 Monocryl sutures and a running 4-0 Monocryl.  The cartilage was kept in the moist gauze.  At this point I  elevated the forehead flap by incising along my planned marks with a 15 blade.  The distal aspect of that this went just into the hairline and there is lateral forehead.  This was then elevated in the subcu muscular and supra pericranial plane down to the level of the base of the pedicle.  At this point I went subperiosteal to get a few extra  millimeters of length.  Meticulous hemostasis was obtained.  The flap bled nicely with no signs of any compromise.  The distal aspect was trimmed as it would not be necessary in order to remove the hairbearing areas.  The underlying muscle was then removed off the distal aspect of the flap to make it a bit thinner.  At this point the forehead was closed with a combination of interrupted buried 3-0 PDS sutures and a running 4-0 Monocryl.  More superiorly where it was a bit tighter I did put some 3-0 PDS mattress sutures.  I then began to inset the internal aspect of the forehead flap.  The most distal aspect of the flap was sewn to the underlying nasal mucosa with interrupted 4-0 Monocryl sutures.  Cartilage flap graft was then put into position with some gentle undermining the surrounding skin to form a pocket for it.  This was secured with 5-0 Monocryl sutures.  The forehead flap was then folded over the cartilage support and inset with interrupted 5-0 Monocryl sutures.  This gave a nice reconstruction of the ala.  There was still remaining defect extending from the upper lip onto the nostril sill and a bit internally as well.  I was able to undermine the upper lip and advanced the skin slightly to close off the majority of this defect.  It was my reasoning that this would be better than a full-thickness skin graft in that location.  The remaining of the internal purse portion should be able to granulate in and mucosalized without any significant distortion.  At this point Surgicel was placed under the underside of the pedicle to help with hemostasis.  The flap appeared to have no signs of any vascular compromise.  Soft dressings were applied.  The patient tolerated the procedure well.  There were no complications. The patient was allowed to wake from anesthesia, extubated and taken to the recovery room in satisfactory condition.

## 2021-04-18 NOTE — Anesthesia Preprocedure Evaluation (Addendum)
Anesthesia Evaluation  Patient identified by MRN, date of birth, ID band Patient awake    Reviewed: Allergy & Precautions, NPO status , Patient's Chart, lab work & pertinent test results, reviewed documented beta blocker date and time   History of Anesthesia Complications Negative for: history of anesthetic complications  Airway Mallampati: II  TM Distance: >3 FB Neck ROM: Full    Dental  (+) Dental Advisory Given, Teeth Intact   Pulmonary neg pulmonary ROS, former smoker,    Pulmonary exam normal        Cardiovascular hypertension, Pt. on medications and Pt. on home beta blockers + CAD and +CHF  + dysrhythmias Atrial Fibrillation  Rhythm:Irregular Rate:Normal     Neuro/Psych TIA   GI/Hepatic negative GI ROS, (+) Cirrhosis   ascites    ,   Endo/Other  negative endocrine ROS  Renal/GU negative Renal ROS  negative genitourinary   Musculoskeletal  (+) Arthritis ,   Abdominal   Peds  Hematology negative hematology ROS (+)   Anesthesia Other Findings  Echo 2019: Compared to a prior study in 09/2017, the LVEF is lower at 45-50%, global hypokinesis, tachycardia is noted, there is mild MR, severe LAE, mild RAE, the RV is moderately dilated and hypokinetic, mild TR, RVSP 40 mmHg + RAP.   Reproductive/Obstetrics                           Anesthesia Physical Anesthesia Plan  ASA: III  Anesthesia Plan: General   Post-op Pain Management:    Induction: Intravenous  PONV Risk Score and Plan: 2 and Ondansetron, Dexamethasone, Treatment may vary due to age or medical condition and Midazolam  Airway Management Planned: Oral ETT  Additional Equipment: None  Intra-op Plan:   Post-operative Plan: Extubation in OR  Informed Consent: I have reviewed the patients History and Physical, chart, labs and discussed the procedure including the risks, benefits and alternatives for the proposed anesthesia  with the patient or authorized representative who has indicated his/her understanding and acceptance.     Dental advisory given  Plan Discussed with:   Anesthesia Plan Comments:         Anesthesia Quick Evaluation

## 2021-04-18 NOTE — Anesthesia Procedure Notes (Signed)
Procedure Name: Intubation Date/Time: 04/18/2021 7:40 AM Performed by: Ignacia Bayley, CRNA Pre-anesthesia Checklist: Patient identified, Patient being monitored, Timeout performed, Emergency Drugs available and Suction available Patient Re-evaluated:Patient Re-evaluated prior to induction Oxygen Delivery Method: Circle System Utilized Preoxygenation: Pre-oxygenation with 100% oxygen Induction Type: IV induction Ventilation: Mask ventilation without difficulty Laryngoscope Size: Miller and 2 Grade View: Grade I Tube type: Oral Tube size: 7.5 mm Number of attempts: 1 Airway Equipment and Method: stylet Placement Confirmation: ETT inserted through vocal cords under direct vision,  positive ETCO2 and breath sounds checked- equal and bilateral Secured at: 21 cm Tube secured with: Tape Dental Injury: Teeth and Oropharynx as per pre-operative assessment

## 2021-04-18 NOTE — Transfer of Care (Signed)
Immediate Anesthesia Transfer of Care Note  Patient: Samuel Flores  Procedure(s) Performed: Reconstruction of right nasal Mohs defect with flaps and grafts as necessary (Right Nose)  Patient Location: PACU  Anesthesia Type:General  Level of Consciousness: awake  Airway & Oxygen Therapy: Patient Spontanous Breathing and Patient connected to face mask oxygen  Post-op Assessment: Report given to RN and Post -op Vital signs reviewed and stable  Post vital signs: Reviewed and stable  Last Vitals:  Vitals Value Taken Time  BP 99/59 04/18/21 0952  Temp 36.6 C 04/18/21 0952  Pulse 94 04/18/21 0954  Resp 29 04/18/21 0954  SpO2 100 % 04/18/21 0954  Vitals shown include unvalidated device data.  Last Pain:  Vitals:   04/18/21 0628  TempSrc: Oral  PainSc:       Patients Stated Pain Goal: 5 (92/33/00 7622)  Complications: No complications documented.

## 2021-04-18 NOTE — Interval H&P Note (Signed)
History and Physical Interval Note:  04/18/2021 7:20 AM  Samuel Flores  has presented today for surgery, with the diagnosis of Basal Cell Carcinoma Of Nose.  The various methods of treatment have been discussed with the patient and family. After consideration of risks, benefits and other options for treatment, the patient has consented to  Procedure(s): Reconstruction of right nasal Mohs defect with flaps and grafts as necessary (Right) as a surgical intervention.  The patient's history has been reviewed, patient examined, no change in status, stable for surgery.  I have reviewed the patient's chart and labs.  Questions were answered to the patient's satisfaction.     Cindra Presume

## 2021-04-18 NOTE — Anesthesia Postprocedure Evaluation (Signed)
Anesthesia Post Note  Patient: Samuel Flores  Procedure(s) Performed: Reconstruction of right nasal Mohs defect with flaps and grafts as necessary (Right Nose)     Patient location during evaluation: PACU Anesthesia Type: General Level of consciousness: awake and alert Pain management: pain level controlled Vital Signs Assessment: post-procedure vital signs reviewed and stable Respiratory status: spontaneous breathing, nonlabored ventilation and respiratory function stable Cardiovascular status: blood pressure returned to baseline and stable Postop Assessment: no apparent nausea or vomiting Anesthetic complications: no   No complications documented.  Last Vitals:  Vitals:   04/18/21 1007 04/18/21 1022  BP: 101/66 (!) 103/59  Pulse: 76 62  Resp: 18 (!) 22  Temp:  36.6 C  SpO2: 95% 97%    Last Pain:  Vitals:   04/18/21 1022  TempSrc:   PainSc: 0-No pain                 Lidia Collum

## 2021-04-18 NOTE — Brief Op Note (Signed)
04/18/2021  1:31 PM  PATIENT:  Samuel Flores  80 y.o. male  PRE-OPERATIVE DIAGNOSIS:  Basal Cell Carcinoma Of Nose  POST-OPERATIVE DIAGNOSIS:  Basal Cell Carcinoma Of Nose  PROCEDURE:  Procedure(s): Reconstruction of right nasal Mohs defect with flaps and grafts as necessary (Right)  SURGEON:  Surgeon(s) and Role:    * Jasaun Carn, Steffanie Dunn, MD - Primary  PHYSICIAN ASSISTANT: [place], RNFA  ASSISTANTS: none   ANESTHESIA:   general  EBL:  15 mL   BLOOD ADMINISTERED:none  DRAINS: none   LOCAL MEDICATIONS USED:  XYLOCAINE   SPECIMEN:  No Specimen  DISPOSITION OF SPECIMEN:  PATHOLOGY  COUNTS:  YES  TOURNIQUET:  * No tourniquets in log *  DICTATION: .Dragon Dictation  PLAN OF CARE: Discharge to home after PACU  PATIENT DISPOSITION:  PACU - hemodynamically stable.   Delay start of Pharmacological VTE agent (>24hrs) due to surgical blood loss or risk of bleeding: not applicable

## 2021-04-18 NOTE — Telephone Encounter (Signed)
Patient had surgery this morning and wanted to know if he could take Tylenol one hour after the hydrocodone because it's not helping the pain. Please call to advise.

## 2021-04-19 ENCOUNTER — Encounter (HOSPITAL_COMMUNITY): Payer: Self-pay | Admitting: Plastic Surgery

## 2021-04-19 NOTE — Telephone Encounter (Addendum)
04/18/21 Pt called with concerns with bleeding noted at his right nostril. He indicated that the bleeding is moderate- but only a "dime size" spot of blood on the gauze sponge. I informed him that this is normal for the type of procedure he had. The underside of the graft is not able to be sutured together which will allow for drainage & small amounts of bleeding. He understands the above information & will call if symptoms worsen.

## 2021-04-21 ENCOUNTER — Encounter: Payer: Medicare Other | Admitting: Plastic Surgery

## 2021-04-28 ENCOUNTER — Other Ambulatory Visit: Payer: Self-pay

## 2021-04-28 ENCOUNTER — Ambulatory Visit (INDEPENDENT_AMBULATORY_CARE_PROVIDER_SITE_OTHER): Payer: Medicare Other | Admitting: Plastic Surgery

## 2021-04-28 DIAGNOSIS — C44311 Basal cell carcinoma of skin of nose: Secondary | ICD-10-CM

## 2021-04-28 NOTE — Progress Notes (Signed)
Patient presents about a week and a half postop from right nasal reconstruction with forehead flap and ear cartilage graft.  He feels like things are going well overall.  On exam the flap is totally viable and looks to be healing appropriately.  This forehead incision is healing appropriately as well.  I did do some gentle debridement of some dried blood but otherwise there do not appear to be any healing issues.  We discussed division and inset and we will plan to do this in the office about a week and a half from now after 3 weeks and passed from the initial procedure.  We discussed the risks and benefits of this and he is fully on board with moving ahead with that plan.  All of his questions were answered.

## 2021-05-02 ENCOUNTER — Ambulatory Visit: Payer: Medicare Other | Admitting: Student

## 2021-05-10 ENCOUNTER — Ambulatory Visit (INDEPENDENT_AMBULATORY_CARE_PROVIDER_SITE_OTHER): Payer: Medicare Other | Admitting: Plastic Surgery

## 2021-05-10 ENCOUNTER — Encounter: Payer: Self-pay | Admitting: Plastic Surgery

## 2021-05-10 ENCOUNTER — Other Ambulatory Visit: Payer: Self-pay

## 2021-05-10 VITALS — BP 102/59 | HR 76 | Ht 70.0 in

## 2021-05-10 DIAGNOSIS — C44311 Basal cell carcinoma of skin of nose: Secondary | ICD-10-CM

## 2021-05-10 NOTE — Progress Notes (Signed)
Operative Note   DATE OF OPERATION: 05/10/2021  LOCATION:    SURGICAL DEPARTMENT: Plastic Surgery  PREOPERATIVE DIAGNOSES: Status post nasal reconstruction with forehead flap  POSTOPERATIVE DIAGNOSES:  same  PROCEDURE:  Division and inset forehead flap  SURGEON: Talmadge Coventry, MD  ANESTHESIA:  Local  COMPLICATIONS: None.   INDICATIONS FOR PROCEDURE:  The patient, Samuel Flores is a 80 y.o. male born on [birth year], is here for treatment of right nasal defect after mohs excision and forehead flap reconstruction. MRN: [id number]  CONSENT:  Informed consent was obtained directly from the patient. Risks, benefits and alternatives were fully discussed. Specific risks including but not limited to bleeding, infection, hematoma, seroma, scarring, pain, infection, wound healing problems, and need for further surgery were all discussed. The patient did have an ample opportunity to have questions answered to satisfaction.   DESCRIPTION OF PROCEDURE:  Local anesthesia was administered. The patient's operative site was prepped and draped in a sterile fashion. A time out was performed and all information was confirmed to be correct.  The pedicle was divided with a 15 blade.  It bled well from both sides.  The pedicle base was then trimmed superiorly above the eyebrow.  This was trimmed to fit appropriately and put the eyebrow in appropriate position.  This was then sutured in place with interrupted 5-0 Monocryl sutures.  I then trimmed the inferior aspect of the flap adjacent to the nasal defect.  This bled nicely indicating good perfusion.  I then thinned this out as much as I felt comfortable superiorly and sewed this back down to the defect with interrupted 5-0 Monocryl sutures.  This gave a nice on table results.  Vaseline was then applied.  The patient tolerated the procedure well.  There were no complications.

## 2021-05-25 DIAGNOSIS — D485 Neoplasm of uncertain behavior of skin: Secondary | ICD-10-CM | POA: Diagnosis not present

## 2021-05-25 DIAGNOSIS — C44629 Squamous cell carcinoma of skin of left upper limb, including shoulder: Secondary | ICD-10-CM | POA: Diagnosis not present

## 2021-06-01 ENCOUNTER — Other Ambulatory Visit: Payer: Self-pay

## 2021-06-01 ENCOUNTER — Ambulatory Visit (INDEPENDENT_AMBULATORY_CARE_PROVIDER_SITE_OTHER): Payer: Medicare Other | Admitting: Plastic Surgery

## 2021-06-01 DIAGNOSIS — C44311 Basal cell carcinoma of skin of nose: Secondary | ICD-10-CM

## 2021-06-01 NOTE — Progress Notes (Signed)
Patient presents postop from division and inset of a forehead flap.  He feels like overall things are going well.  On exam the tissue is viable and he has a reasonable shape to his nose.  There is still some scabbing along the superior incisions and some scabbing in the forehead.  Offered to remove some of the scabs and sutures but he would rather let them take their course on their own.  Overall he has a reasonable reconstructive result and I think it will soften and improve with time as the swelling continues to go down.  He feels overall pretty good and would rather just call us if he has a problem and so we will follow-up with him on an as-needed basis.  All of his questions were answered.

## 2021-06-11 ENCOUNTER — Other Ambulatory Visit: Payer: Self-pay | Admitting: Cardiology

## 2021-06-15 ENCOUNTER — Other Ambulatory Visit: Payer: Self-pay | Admitting: Cardiology

## 2021-06-15 NOTE — Telephone Encounter (Signed)
Do I need to call the pharmacy and cancel this Rx?

## 2021-06-15 NOTE — Telephone Encounter (Signed)
He needs Lopressor 50 mg BID

## 2021-06-17 ENCOUNTER — Other Ambulatory Visit: Payer: Self-pay

## 2021-06-17 MED ORDER — METOPROLOL TARTRATE 50 MG PO TABS: 50.0000 mg | ORAL_TABLET | Freq: Two times a day (BID) | ORAL | 3 refills | Status: DC

## 2021-06-30 ENCOUNTER — Ambulatory Visit (INDEPENDENT_AMBULATORY_CARE_PROVIDER_SITE_OTHER): Payer: Medicare Other

## 2021-06-30 ENCOUNTER — Other Ambulatory Visit: Payer: Self-pay

## 2021-06-30 VITALS — BP 102/59 | Temp 98.2°F | Ht 70.0 in | Wt 187.0 lb

## 2021-06-30 DIAGNOSIS — C44311 Basal cell carcinoma of skin of nose: Secondary | ICD-10-CM

## 2021-07-02 NOTE — Patient Instructions (Signed)
Patient will call for any concerns

## 2021-07-02 NOTE — Progress Notes (Signed)
Patient in office today for postop-f/u  S/P - repair of MOHS defect right nasal area by Dr. Claudia Desanctis on 04/18/21 On exam- the nasal repair has healed- there is a small amount of "scab" on right nasal area.- I did attempt to remove this - but patient did not want me to remove d/t pain - he will use warm water in the shower to help assist with removal. Right scalp area had a thick "scab" over the wound- which I was able to remove with saline/gauze & forceps. This wound bed measures ~1.5 cm x 2 cm depth = 5 cm. The edges and wound bed appear viable and there is wound bed bleeding. I applied vaseline to the intact areas of the incision & then applied gauze dressing taped in place over the entire wound.  I instructed pt to keep the opened area covered with bandage.   Patient will monitor s/s of any complications- he will call for any concerns. He did not wish to schedule another f/u-at this time. Photos obtained & entered into chart

## 2021-07-04 DIAGNOSIS — C44629 Squamous cell carcinoma of skin of left upper limb, including shoulder: Secondary | ICD-10-CM | POA: Diagnosis not present

## 2021-07-22 NOTE — Progress Notes (Signed)
Subjective:   Samuel Flores is a 80 y.o. male who presents for Medicare Annual/Subsequent preventive examination. I connected with  Gaston Islam on 07/22/21 by a audio enabled telemedicine application and verified that I am speaking with the correct person using two identifiers.   I discussed the limitations of evaluation and management by telemedicine. The patient expressed understanding and agreed to proceed.  Review of Systems    Deferred to PCP       Objective:    There were no vitals filed for this visit. There is no height or weight on file to calculate BMI.  Advanced Directives 04/18/2021 03/03/2020 07/29/2018 09/27/2017 02/20/2017 01/01/2013  Does Patient Have a Medical Advance Directive? No Yes No Yes Yes Patient has advance directive, copy not in chart  Type of Advance Directive - Living will - Living will;Healthcare Power of [place] will [place];Living will  Does patient want to make changes to medical advance directive? - Yes (MAU/Ambulatory/Procedural Areas - Information given) - Yes (Inpatient - patient requests chaplain consult to change a medical advance directive) - -  Would patient like information on creating a medical advance directive? No - Patient declined - No - Patient declined - - -  Pre-existing out of facility DNR order (yellow form or pink MOST form) - - - - - No    Current Medications (verified) Outpatient Encounter Medications as of 07/23/2021  Medication Sig   allopurinol (ZYLOPRIM) 100 MG tablet TAKE 2 TABLETS BY MOUTH EVERY DAY (Patient taking differently: Take 200 mg by mouth daily.)   diltiazem (CARDIZEM CD) 180 MG 24 hr capsule TAKE 1 CAPSULE BY MOUTH EVERY DAY   folic acid (FOLVITE) [id number] MCG tablet Take 800 mcg by mouth daily.   metoprolol tartrate (LOPRESSOR) 50 MG tablet Take 1 tablet (50 mg total) by mouth 2 (two) times daily.   silver sulfADIAZINE (SILVADENE) 1 % cream APPLY A SMALL AMOUNT TO AFFECTED AREA TWICE  A DAY AS NEEDED   spironolactone (ALDACTONE) 50 MG tablet TAKE 1 TABLET BY MOUTH EVERY DAY   No facility-administered encounter medications on file as of 07/23/2021.    Allergies (verified) Patient has no known allergies.   History: Past Medical History:  Diagnosis Date   Alcohol abuse, daily use [id number]   Alcoholic cirrhosis of liver with ascites ([place]) 08/01/2018   Arthritis    osteoarthritis   CAD (coronary artery disease)    Non obstructive   Cancer ([place]) 1994   prostate   CHF (congestive heart failure) ([place])    Diastolic dysfunction Echo 2012   Grade 1   Essential hypertension 07/15/2013   Gout    Hx of subdural hematoma 03/2011   Hx-TIA (transient ischemic attack) 2011   Hypercholesteremia    Hypertension    Stroke [facility])    ? mini stroke    Past Surgical History:  Procedure Laterality Date   BURR HOLE FOR SUBDURAL HEMATOMA  03/2011   CARDIOVERSION N/A 02/20/2017   Procedure: CARDIOVERSION;  Surgeon: Adrian Prows, MD;  Location: [place];  Service: Cardiovascular;  Laterality: N/A;   CHOLECYSTECTOMY N/A 08/08/2018   Procedure: LAPAROSCOPIC CHOLECYSTECTOMY;  Surgeon: Stark Klein, MD;  [facility];  Service: General;  Laterality: N/A;   EYE SURGERY Left 03/2009   cataract   LEFT HEART CATHETERIZATION WITH CORONARY ANGIOGRAM N/A 01/02/2013   Procedure: LEFT HEART CATHETERIZATION WITH CORONARY ANGIOGRAM;  Surgeon: Laverda Page, MD;  [facility];  Service: Cardiovascular;  Laterality: N/A;   NASAL FLAP ROTATION Right 04/18/2021   Procedure: Reconstruction of right nasal Mohs defect with flaps and grafts as necessary;  Surgeon: Cindra Presume, MD;  Location: [place];  Service: Plastics;  Laterality: Right;   PROSTATE SURGERY  1994   SKIN SURGERY     SPINE SURGERY     cervical   Family History  Problem Relation Age of Onset   Lung cancer Mother        never smoker   Prostate cancer Father    Hypertension Sister    Hypertension Sister    Pancreatic  cancer Brother    Social History   Socioeconomic History   Marital status: Married    Spouse name: Not on file   Number of children: 2   Years of education: Not on file   Highest education level: Not on file  Occupational History   Occupation: Previous work at Office manager course    Employer: RETIRED  Tobacco Use   Smoking status: Former    Types: Pipe    Quit date: 04/09/2013    Years since quitting: 8.2   Smokeless tobacco: Never  Vaping Use   Vaping Use: Never used  Substance and Sexual Activity   Alcohol use: Not Currently    Comment: Pt has not had alcohol in 15 months    Drug use: No   Sexual activity: Never  Other Topics Concern   Not on file  Social History Narrative   Not on file   Social Determinants of Health   Financial Resource Strain: Not on file  Food Insecurity: Not on file  Transportation Needs: Not on file  Physical Activity: Not on file  Stress: Not on file  Social Connections: Not on file    Tobacco Counseling Counseling given: Not Answered   Clinical Intake:   Diabetic?no  Activities of Daily Living In your present state of health, do you have any difficulty performing the following activities: 04/18/2021 04/18/2021  Hearing? [place]? N -  Difficulty concentrating or making decisions? N -  Walking or climbing stairs? N -  Dressing or bathing? N -  Doing errands, shopping? - N  Some recent data might be hidden    Patient Care Team: Susy Frizzle, MD as PCP - General (Family Medicine) Edythe Clarity, [facility] as Pharmacist (Pharmacist) Adrian Prows, MD as Consulting Physician (Cardiology)  Indicate any recent Medical Services you may have received from other than [facility] providers in the past year (date may be approximate).     Assessment:   This is a routine wellness examination for Samuel Flores.  Hearing/Vision screen No results found.  Dietary issues and exercise activities discussed:     Goals Addressed   None    Depression  Screen PHQ 2/9 Scores 03/18/2021 03/03/2020 09/01/2019 08/20/2018 11/23/2017 02/27/2017 10/24/2016  PHQ - 2 Score 0 0 0 0 0 0 1  PHQ- 9 Score - 0 - - - 0 2    Fall Risk Fall Risk  03/18/2021 03/03/2020 09/01/2019 08/20/2018 11/23/2017  Falls in the past year? 0 0 0 No No  Number falls in past yr: - - - - -  Injury with Fall? - - - - -  Risk for fall due to : No Fall Risks No Fall Risks - - -  Follow up Falls evaluation completed Falls evaluation completed Falls evaluation completed - -    FALL RISK PREVENTION PERTAINING TO THE HOME:  Any stairs in or  around the home? Yes  If so, are there any without handrails? Yes  Home free of loose throw rugs in walkways, pet beds, electrical cords, etc? Yes  Adequate lighting in your home to reduce risk of falls? Yes   ASSISTIVE DEVICES UTILIZED TO PREVENT FALLS:  Life alert? No  Use of a cane, walker or w/c?  occasionally Grab bars in the bathroom? Yes  Shower chair or bench in shower? No  Elevated toilet seat or a handicapped toilet? No   TIMED UP AND GO:  Was the test performed? No . Due to telephonic visit    Cognitive Function: Patient able to complete memory test phrase.Repeated five word back in correct order. Immunizations Immunization History  Administered Date(s) Administered   Fluad Quad(high Dose 65+) 10/20/2020   Influenza, High Dose Seasonal PF 08/20/2018   Influenza,inj,Quad PF,6+ Mos 10/24/2016, 09/11/2017, 09/01/2019   PFIZER(Purple Top)SARS-COV-2 Vaccination 03/04/2020, 03/29/2020   Pneumococcal Conjugate-13 11/23/2017   Zoster, Live 09/05/2011    TDAP status: Due, Education has been provided regarding the importance of this vaccine. Advised may receive this vaccine at local pharmacy or Health Dept. Aware to provide a copy of the vaccination record if obtained from local pharmacy or Health Dept. Verbalized acceptance and understanding.  Flu Vaccine status: Due, Education has been provided regarding the importance of this  vaccine. Advised may receive this vaccine at local pharmacy or Health Dept. Aware to provide a copy of the vaccination record if obtained from local pharmacy or Health Dept. Verbalized acceptance and understanding.  Pneumococcal vaccine status: Due, Education has been provided regarding the importance of this vaccine. Advised may receive this vaccine at local pharmacy or Health Dept. Aware to provide a copy of the vaccination record if obtained from local pharmacy or Health Dept. Verbalized acceptance and understanding.  Covid-19 vaccine status: Completed vaccines  Qualifies for Shingles Vaccine? Yes   Zostavax completed Yes   Shingrix Completed?: No.    Education has been provided regarding the importance of this vaccine. Patient has been advised to call insurance company to determine out of pocket expense if they have not yet received this vaccine. Advised may also receive vaccine at local pharmacy or Health Dept. Verbalized acceptance and understanding.  Screening Tests Health Maintenance  Topic Date Due   TETANUS/TDAP  Never done   Zoster Vaccines- Shingrix (1 of 2) Never done   PNA vac Low Risk Adult (2 of 2 - PPSV23) 11/23/2018   COVID-19 Vaccine (3 - Pfizer risk series) 04/26/2020   INFLUENZA VACCINE  06/13/2021   HPV VACCINES  Aged Out    Health Maintenance  Health Maintenance Due  Topic Date Due   TETANUS/TDAP  Never done   Zoster Vaccines- Shingrix (1 of 2) Never done   PNA vac Low Risk Adult (2 of 2 - PPSV23) 11/23/2018   COVID-19 Vaccine (3 - Pfizer risk series) 04/26/2020   INFLUENZA VACCINE  06/13/2021    Colorectal cancer screening: No longer required.   Lung Cancer Screening: (Low Dose CT Chest recommended if Age 15-80 years, 30 pack-year currently smoking OR have quit w/in 15years.) does not qualify.   Lung Cancer Screening Referral: N/a  Additional Screening:  Hepatitis C Screening: does not qualify; Completed   Vision Screening: Recommended annual  ophthalmology exams for early detection of glaucoma and other disorders of the eye. Is the patient up to date with their annual eye exam?  No  Who is the provider or what is the name of the office  in which the patient attends annual eye exams? No, "I had one and they moved- so I quite going" If pt is not established with a provider, would they like to be referred to a provider to establish care? Yes .   Dental Screening: Recommended annual dental exams for proper oral hygiene. Patient sees Dr. Magnus Ivan for dental care  Community Resource Referral / Chronic Care Management: CRR required this visit?  No   CCM required this visit?  No      Plan:     I have personally reviewed and noted the following in the patient's chart:   Medical and social history Use of alcohol, tobacco or illicit drugs  Current medications and supplements including opioid prescriptions. Patient is not currently taking opioid prescriptions. Functional ability and status Nutritional status Physical activity Advanced directives List of other physicians Hospitalizations, surgeries, and ER visits in previous 12 months Vitals Screenings to include cognitive, depression, and falls Referrals and appointments  In addition, I have reviewed and discussed with patient certain preventive protocols, quality metrics, and best practice recommendations. A written personalized care plan for preventive services as well as general preventive health recommendations were provided to patient.     Kathrene Alu, RN   07/22/2021   Nurse Notes:Non Face to face 20 mins visit.   Mr. Samuel Flores , Thank you for taking time to come for your Medicare Wellness Visit. I appreciate your ongoing commitment to your health goals. Please review the following plan we discussed and let me know if I can assist you in the future.   These are the goals we discussed: Finding an eye doctor to see you on a yearly basis for eye exams.   Follow up and  have a yearly exam done at [facility].  Thank you, Kathrene Alu RN

## 2021-07-23 ENCOUNTER — Ambulatory Visit (INDEPENDENT_AMBULATORY_CARE_PROVIDER_SITE_OTHER): Payer: Medicare Other

## 2021-07-23 DIAGNOSIS — Z0000 Encounter for general adult medical examination without abnormal findings: Secondary | ICD-10-CM

## 2021-08-01 NOTE — Addendum Note (Signed)
Addended by: Edgar Frisk on: 08/01/2021 02:34 PM   Modules accepted: Level of Service

## 2021-08-04 NOTE — Progress Notes (Signed)
For this visit  Patient was at home Provider in office conduction telephone visit. Kathrene Alu RN

## 2021-08-17 ENCOUNTER — Encounter: Payer: Self-pay | Admitting: Cardiology

## 2021-08-17 ENCOUNTER — Ambulatory Visit: Payer: Medicare Other | Admitting: Cardiology

## 2021-08-17 ENCOUNTER — Other Ambulatory Visit: Payer: Self-pay

## 2021-08-17 VITALS — BP 107/59 | HR 95 | Temp 98.1°F | Resp 16 | Ht 70.0 in | Wt 188.4 lb

## 2021-08-17 DIAGNOSIS — I4821 Permanent atrial fibrillation: Secondary | ICD-10-CM

## 2021-08-17 DIAGNOSIS — I10 Essential (primary) hypertension: Secondary | ICD-10-CM

## 2021-08-17 DIAGNOSIS — I2510 Atherosclerotic heart disease of native coronary artery without angina pectoris: Secondary | ICD-10-CM

## 2021-08-17 DIAGNOSIS — Z5309 Procedure and treatment not carried out because of other contraindication: Secondary | ICD-10-CM | POA: Diagnosis not present

## 2021-08-17 DIAGNOSIS — K7030 Alcoholic cirrhosis of liver without ascites: Secondary | ICD-10-CM | POA: Diagnosis not present

## 2021-08-17 DIAGNOSIS — I25118 Atherosclerotic heart disease of native coronary artery with other forms of angina pectoris: Secondary | ICD-10-CM | POA: Diagnosis not present

## 2021-08-17 NOTE — Progress Notes (Signed)
Primary Physician/Referring:  Susy Frizzle, MD  Patient ID: Samuel Flores, male    DOB: 08-27-1941, 80 y.o.   MRN: [id number]  Chief Complaint  Patient presents with   Coronary Artery Disease   Hyperlipidemia   Atrial Fibrillation   Follow-up    1 Year   HPI:    Samuel Flores  is a 80 y.o.  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  He is not on anticoagulation due to underlying cirrhosis and high risk for bleeding complications.  He is presently doing well and has remained abstinent from alcohol.  He has not had any recent hospitalization.  He does have mild chronic dyspnea and cough which is unchanged.  No leg edema, no PND or orthopnea.    Past Medical History:  Diagnosis Date   Alcohol abuse, daily use 97/0/2637   Alcoholic cirrhosis of liver with ascites ([place]) 08/01/2018   Arthritis    osteoarthritis   CAD (coronary artery disease)    Non obstructive   Cancer ([place]) 1994   prostate   CHF (congestive heart failure) ([place])    Diastolic dysfunction Echo 2012   Grade 1   Essential hypertension 07/15/2013   Gout    Hx of subdural hematoma 03/2011   Hx-TIA (transient ischemic attack) 2011   Hypercholesteremia    Hypertension    Stroke [facility])    ? mini stroke    Past Surgical History:  Procedure Laterality Date   BURR HOLE FOR SUBDURAL HEMATOMA  03/2011   CARDIOVERSION N/A 02/20/2017   Procedure: CARDIOVERSION;  Surgeon: Adrian Prows, MD;  Location: [place];  Service: Cardiovascular;  Laterality: N/A;   CHOLECYSTECTOMY N/A 08/08/2018   Procedure: LAPAROSCOPIC CHOLECYSTECTOMY;  Surgeon: Stark Klein, MD;  [facility];  Service: General;  Laterality: N/A;   EYE SURGERY Left 03/2009   cataract   LEFT HEART  CATHETERIZATION WITH CORONARY ANGIOGRAM N/A 01/02/2013   Procedure: LEFT HEART CATHETERIZATION WITH CORONARY ANGIOGRAM;  Surgeon: Laverda Page, MD;  Location: [facility] CATH LAB;  Service: Cardiovascular;  Laterality: N/A;   NASAL FLAP ROTATION Right 04/18/2021   Procedure: Reconstruction of right nasal Mohs defect with flaps and grafts as necessary;  Surgeon: Cindra Presume, MD;  Location: [place];  Service: Plastics;  Laterality: Right;   PROSTATE SURGERY  1994   SKIN SURGERY     SPINE SURGERY     cervical   Social History   Tobacco Use   Smoking status: Former    Types: Pipe    Quit date: 04/09/2013    Years since quitting: 8.3   Smokeless tobacco: Never  Substance Use Topics   Alcohol use: Not Currently    Comment: Pt has not had alcohol in 15 months   Marital Status: Married   ROS  Review of Systems  Cardiovascular:  Positive for dyspnea on exertion. Negative for chest pain and leg swelling.  Respiratory:  Positive for wheezing.   Gastrointestinal:  Negative for melena.  Objective  Blood pressure (!) 107/59, pulse 95, temperature 98.1 F (36.7 C), temperature source Temporal, resp. rate 16, height 5\' 10"  (1.778 m), weight 188 lb 6.4 oz (85.5 kg), SpO2 98 %. Body mass index is 27.03 kg/m.  Vitals with BMI 08/17/2021 07/13/2021 05/10/2021  Height 5\' 10"   5\' 10"  5\' 10"   Weight 188 lbs 6 oz 187 lbs -  BMI 81.01 75.10 -  Systolic 258 527 782  Diastolic 59 59 59  Pulse 95 - 76     Physical Exam Constitutional:      General: He is not in acute distress.    Appearance: He is well-developed.  Neck:     Thyroid: No thyromegaly.     Vascular: No JVD.  Cardiovascular:     Rate and Rhythm: Rhythm irregularly irregular.     Pulses: Normal pulses and intact distal pulses.     Heart sounds: No murmur heard.   No gallop. No S3 or S4 sounds.     Comments: S1 is variable, S2 is normal.  no edema Pulmonary:     Effort: Pulmonary effort is normal.     Breath sounds: Normal breath sounds.   Abdominal:     General: Bowel sounds are normal. There is distension (ascites, minimal).     Palpations: Abdomen is soft.   Radiology: No results found.  Laboratory examination:   Recent Labs    03/18/21 1535  NA 139  K 4.8  CL 100  CO2 28  GLUCOSE 106*  BUN 19  CREATININE 1.19*  CALCIUM 9.3  GFRNONAA 57*  GFRAA 66   CMP Latest Ref Rng & Units 03/18/2021 07/05/2020 09/03/2019  Glucose 65 - 99 mg/dL 106(H) 101(H) 101(H)  BUN 7 - 25 mg/dL 19 19 19   Creatinine 0.70 - 1.11 mg/dL 1.19(H) 1.09 1.09  Sodium 135 - 146 mmol/L 139 137 138  Potassium 3.5 - 5.3 mmol/L 4.8 4.1 4.3  Chloride 98 - 110 mmol/L 100 103 103  CO2 20 - 32 mmol/L 28 24 24   Calcium 8.6 - 10.3 mg/dL 9.3 9.2 9.2  Total Protein 6.1 - 8.1 g/dL 6.4 6.5 6.5  Total Bilirubin 0.2 - 1.2 mg/dL 0.6 0.7 0.8  Alkaline Phos 38 - 126 U/L - - -  AST 10 - 35 U/L 18 23 19   ALT 9 - 46 U/L 12 24 17    CBC Latest Ref Rng & Units 03/18/2021 07/05/2020 09/03/2019  WBC 3.8 - 10.8 Thousand/uL 10.4 12.4(H) 12.7(H)  Hemoglobin 13.2 - 17.1 g/dL 15.9 16.2 15.9  Hematocrit 38.5 - 50.0 % 47.4 46.8 46.7  Platelets 140 - 400 Thousand/uL 245 230 228   Lipid Panel     Component Value Date/Time   CHOL 139 07/05/2020 1558   TRIG 176 (H) 07/05/2020 1558   HDL 30 (L) 07/05/2020 1558   CHOLHDL 4.6 07/05/2020 1558   VLDL 27 10/24/2016 1004   LDLCALC 82 07/05/2020 1558   HEMOGLOBIN A1C Lab Results  Component Value Date   HGBA1C 5.8 (H) 07/29/2018   MPG 120 07/29/2018   TSH No results for input(s): TSH in the last 8760 hours. Medications   Current Outpatient Medications on File Prior to Visit  Medication Sig Dispense Refill   allopurinol (ZYLOPRIM) 100 MG tablet TAKE 2 TABLETS BY MOUTH EVERY DAY (Patient taking differently: Take 200 mg by mouth daily.) 180 tablet 2   diltiazem (CARDIZEM CD) 180 MG 24 hr capsule TAKE 1 CAPSULE BY MOUTH EVERY DAY 90 capsule 1   folic acid (FOLVITE) 423 MCG tablet Take 800 mcg by mouth daily.      metoprolol tartrate (LOPRESSOR) 50 MG tablet Take 1 tablet (50 mg total) by mouth 2 (two) times daily. 180 tablet 3   silver sulfADIAZINE (SILVADENE) 1 % cream APPLY A SMALL AMOUNT TO AFFECTED AREA  TWICE A DAY AS NEEDED     spironolactone (ALDACTONE) 50 MG tablet TAKE 1 TABLET BY MOUTH EVERY DAY 90 tablet 3   No current facility-administered medications on file prior to visit.    Cardiac Studies:   Coronary angiogram 01/10/2013: Severe diffuse coronary calcification with scattered 20-30% stenosis. Normal LVEF.   Echocardiogram 08/07/2018: - Left ventricle: The cavity size was normal. Wall thickness was  increased in a pattern of mild LVH. Systolic function was mildly  reduced. The estimated ejection fraction was in the range of 45%   to 50%. Diffuse hypokinesis. The study is not technically sufficient to allow evaluation of LV diastolic function. - Mitral valve: Mildly thickened leaflets . There was mild  regurgitation. - Left atrium: Severely dilated. - Right ventricle: Moderately dilated and moderately hypokinetic. - Right atrium: The atrium was mildly dilated. - Tricuspid valve: There was mild regurgitation. - Pulmonary arteries: PA peak pressure: 40 mm Hg (S) + RAP. - Systemic veins: IVC is not visualized.  EKG:    EKG 08/17/2021: Atrial fibrillation with controlled ventricular sponsor rate of 78 bpm, left axis deviation, left anterior fascicular block.  Poor R wave progression, cannot exclude anteroseptal infarct old.  Nonspecific T abnormality.  Frequent PVCs (3) multifocal (2).  No significant change from 06/17/2020.  Assessment     ICD-10-CM   1. Coronary artery disease involving native coronary artery of native heart without angina pectoris  I25.10 EKG 12-Lead    2. Permanent atrial fibrillation (HCC)  I48.21     3. Essential hypertension  I10     4. Alcoholic cirrhosis of liver without ascites (HCC)  K70.30     5. Medical contraindication to statin therapy  Z53.09        No orders of the defined types were placed in this encounter. There are no discontinued medications.    Recommendations:   Samuel Flores  is a 80 y.o. 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  He is not on anticoagulation due to underlying cirrhosis and high risk for bleeding complications.  He is presently doing well and has remained abstinent from alcohol.  He has not had any recent hospitalization.  I reviewed his labs, LFTs have remained stable, no significant ascites on exam   No changes were done, blood pressures well controlled, I will see him back in 1 year for follow-up.  Atrial fibrillation remains rate controlled and CBC has remained stable and platelet count is stable.  No change in his physical exam from last year.   Adrian Prows, MD, [facility] 08/17/2021, 3:24 PM Office: [phone]

## 2021-08-31 ENCOUNTER — Other Ambulatory Visit: Payer: Self-pay

## 2021-08-31 ENCOUNTER — Ambulatory Visit (INDEPENDENT_AMBULATORY_CARE_PROVIDER_SITE_OTHER): Payer: Medicare Other | Admitting: *Deleted

## 2021-08-31 DIAGNOSIS — Z23 Encounter for immunization: Secondary | ICD-10-CM

## 2021-09-22 ENCOUNTER — Other Ambulatory Visit: Payer: Self-pay | Admitting: Cardiology

## 2021-09-22 DIAGNOSIS — K7030 Alcoholic cirrhosis of liver without ascites: Secondary | ICD-10-CM

## 2021-10-18 DIAGNOSIS — Z08 Encounter for follow-up examination after completed treatment for malignant neoplasm: Secondary | ICD-10-CM | POA: Diagnosis not present

## 2021-10-18 DIAGNOSIS — L718 Other rosacea: Secondary | ICD-10-CM | POA: Diagnosis not present

## 2021-10-18 DIAGNOSIS — L570 Actinic keratosis: Secondary | ICD-10-CM | POA: Diagnosis not present

## 2021-10-18 DIAGNOSIS — Z85828 Personal history of other malignant neoplasm of skin: Secondary | ICD-10-CM | POA: Diagnosis not present

## 2021-10-18 DIAGNOSIS — L819 Disorder of pigmentation, unspecified: Secondary | ICD-10-CM | POA: Diagnosis not present

## 2021-12-11 ENCOUNTER — Other Ambulatory Visit: Payer: Self-pay | Admitting: Student

## 2021-12-24 ENCOUNTER — Other Ambulatory Visit: Payer: Self-pay | Admitting: Family Medicine

## 2022-04-21 ENCOUNTER — Other Ambulatory Visit: Payer: Self-pay | Admitting: Student

## 2022-06-30 ENCOUNTER — Ambulatory Visit (INDEPENDENT_AMBULATORY_CARE_PROVIDER_SITE_OTHER): Payer: Medicare Other | Admitting: Family Medicine

## 2022-06-30 VITALS — BP 112/64 | HR 94 | Temp 97.5°F | Ht 70.0 in | Wt 182.8 lb

## 2022-06-30 DIAGNOSIS — R252 Cramp and spasm: Secondary | ICD-10-CM

## 2022-06-30 NOTE — Progress Notes (Signed)
Subjective:    Patient ID: Samuel Flores, male    DOB: Mar 19, 1941, 81 y.o.   MRN: [id number]  HPI Patient is here today reporting cramps in his legs at night.  He states that when he goes to sleep, he will have throbbing cramps in his calves and in his ankles.  They can last hours.  During the day, he denies any leg pain.  Specifically he denies any claudication.  He denies any neuropathy.  Past medical history is significant for alcoholic cirrhosis of the liver.  He also has a history of atrial fibrillation.  He also has a history of mildly suppressed ejection fraction congestive heart failure with an EF of 45 to 50% on an echocardiogram in 2019 along with diastolic dysfunction.  Per previous office notes, he is not on anticoagulation due to the cirrhosis of his liver and his increased fall risk.  Apparently in the past he had a subdural hematoma due to falling.  Therefore he was considered high risk for complications due to chronic anticoagulation and his currently only on rate control with metoprolol for his atrial fibrillation.  He is on spironolactone due to his history of ascites.  Past Medical History:  Diagnosis Date   Alcohol abuse, daily use 88/02/1659   Alcoholic cirrhosis of liver with ascites ([place]) 08/01/2018   Arthritis    osteoarthritis   CAD (coronary artery disease)    Non obstructive   Cancer ([place]) 1994   prostate   CHF (congestive heart failure) ([place])    Diastolic dysfunction Echo 2012   Grade 1   Essential hypertension 07/15/2013   Gout    Hx of subdural hematoma 03/2011   Hx-TIA (transient ischemic attack) 2011   Hypercholesteremia    Hypertension    Stroke [facility])    ? mini stroke    Past Surgical History:  Procedure Laterality Date   BURR HOLE FOR SUBDURAL HEMATOMA  03/2011   CARDIOVERSION N/A 02/20/2017   Procedure: CARDIOVERSION;  Surgeon: Adrian Prows, MD;  Location: [place];  Service: Cardiovascular;  Laterality: N/A;   CHOLECYSTECTOMY N/A 08/08/2018    Procedure: LAPAROSCOPIC CHOLECYSTECTOMY;  Surgeon: Stark Klein, MD;  [facility];  Service: General;  Laterality: N/A;   EYE SURGERY Left 03/2009   cataract   LEFT HEART CATHETERIZATION WITH CORONARY ANGIOGRAM N/A 01/02/2013   Procedure: LEFT HEART CATHETERIZATION WITH CORONARY ANGIOGRAM;  Surgeon: Laverda Page, MD;  Location: [facility] CATH LAB;  Service: Cardiovascular;  Laterality: N/A;   NASAL FLAP ROTATION Right 04/18/2021   Procedure: Reconstruction of right nasal Mohs defect with flaps and grafts as necessary;  Surgeon: Cindra Presume, MD;  Location: [place];  Service: Plastics;  Laterality: Right;   PROSTATE SURGERY  1994   SKIN SURGERY     SPINE SURGERY     cervical   Current Outpatient Medications on File Prior to Visit  Medication Sig Dispense Refill   allopurinol (ZYLOPRIM) 100 MG tablet TAKE 2 TABLETS BY MOUTH EVERY DAY 180 tablet 2   diltiazem (CARDIZEM CD) 180 MG 24 hr capsule TAKE 1 CAPSULE BY MOUTH EVERY DAY 90 capsule 1   folic acid (FOLVITE) 630 MCG tablet Take 800 mcg by mouth daily.     metoprolol tartrate (LOPRESSOR) 50 MG tablet Take 1 tablet (50 mg total) by mouth 2 (two) times daily. 180 tablet 3   silver sulfADIAZINE (SILVADENE) 1 % cream APPLY A SMALL AMOUNT TO AFFECTED AREA TWICE A DAY AS NEEDED  spironolactone (ALDACTONE) 50 MG tablet TAKE 1 TABLET BY MOUTH EVERY DAY 90 tablet 3   No current facility-administered medications on file prior to visit.   No Known Allergies Social History   Socioeconomic History   Marital status: Married    Spouse name: Not on file   Number of children: 2   Years of education: Not on file   Highest education level: Not on file  Occupational History   Occupation: Previous work at [employer]    Employer: RETIRED  Tobacco Use   Smoking status: Former    Types: Pipe    Quit date: 04/09/2013    Years since quitting: 9.2   Smokeless tobacco: Never  Vaping Use   Vaping Use: Never used  Substance and Sexual Activity    Alcohol use: Not Currently    Comment: Pt has not had alcohol in 15 months    Drug use: No   Sexual activity: Never  Other Topics Concern   Not on file  Social History Narrative   Not on file   Social Determinants of Health   Financial Resource Strain: Not on file  Food Insecurity: Not on file  Transportation Needs: Not on file  Physical Activity: Not on file  Stress: Not on file  Social Connections: Not on file  Intimate Partner Violence: Not on file      Review of Systems  All other systems reviewed and are negative.      Objective:   Physical Exam Vitals reviewed.  Constitutional:      General: He is not in acute distress.    Appearance: He is obese. He is not ill-appearing or toxic-appearing.  HENT:     Head: Normocephalic and atraumatic.  Cardiovascular:     Rate and Rhythm: Normal rate. Rhythm irregular.     Heart sounds: Normal heart sounds. No murmur heard.    No friction rub. No gallop.  Pulmonary:     [place]: Pulmonary effort is normal. No respiratory distress.     Breath sounds: Normal breath sounds. No stridor. No wheezing or rhonchi.  Abdominal:     General: Bowel sounds are normal. There is no distension.     Palpations: Abdomen is soft.     Tenderness: There is no abdominal tenderness.  Musculoskeletal:     Right lower leg: No edema.     Left lower leg: No edema.  Skin:    Findings: No erythema or rash.  Neurological:     General: No focal deficit present.     Mental Status: He is alert and oriented to person, place, and time.     Cranial Nerves: No cranial nerve deficit.     Motor: No weakness.  Psychiatric:        Mood and Affect: Mood normal.        Behavior: Behavior normal.        Thought Content: Thought content normal.           Assessment & Plan:  Cramps of lower extremity - Plan: COMPLETE METABOLIC PANEL WITH GFR, CBC with Differential/Platelet, Magnesium Patient has had cramps.  I recommended trying 1 glass of tonic water  with quinine every night.  I will also check for electrolyte disturbances such as low magnesium, low potassium, or low calcium.  I suspect some of this could be due to the fact he is on a diuretic.

## 2022-07-01 LAB — COMPLETE METABOLIC PANEL WITH GFR
AG Ratio: 1.7 (calc) (ref 1.0–2.5)
ALT: 10 U/L (ref 9–46)
AST: 16 U/L (ref 10–35)
Albumin: 4.1 g/dL (ref 3.6–5.1)
Alkaline phosphatase (APISO): 106 U/L (ref 35–144)
BUN: 15 mg/dL (ref 7–25)
CO2: 29 mmol/L (ref 20–32)
Calcium: 9.2 mg/dL (ref 8.6–10.3)
Chloride: 101 mmol/L (ref 98–110)
Creat: 1.04 mg/dL (ref 0.70–1.22)
Globulin: 2.4 g/dL (calc) (ref 1.9–3.7)
Glucose, Bld: 101 mg/dL — ABNORMAL HIGH (ref 65–99)
Potassium: 4.1 mmol/L (ref 3.5–5.3)
Sodium: 139 mmol/L (ref 135–146)
Total Bilirubin: 0.8 mg/dL (ref 0.2–1.2)
Total Protein: 6.5 g/dL (ref 6.1–8.1)
eGFR: 71 mL/min/{1.73_m2} (ref >=60)

## 2022-07-01 LAB — CBC WITH DIFFERENTIAL/PLATELET
Absolute Monocytes: 869 cells/uL (ref 200–950)
Basophils Absolute: 60 cells/uL (ref 0–200)
Basophils Relative: 0.5 %
Eosinophils Absolute: 179 cells/uL (ref 15–500)
Eosinophils Relative: 1.5 %
HCT: 44.9 % (ref 38.5–50.0)
Hemoglobin: 15.8 g/dL (ref 13.2–17.1)
Lymphs Abs: 1821 cells/uL (ref 850–3900)
MCH: 32.2 pg (ref 27.0–33.0)
MCHC: 35.2 g/dL (ref 32.0–36.0)
MCV: 91.6 fL (ref 80.0–100.0)
MPV: 9.8 fL (ref 7.5–12.5)
Monocytes Relative: 7.3 %
Neutro Abs: 8973 cells/uL — ABNORMAL HIGH (ref 1500–7800)
Neutrophils Relative %: 75.4 %
Platelets: 268 10*3/uL (ref 140–400)
RBC: 4.9 10*6/uL (ref 4.20–5.80)
RDW: 13.2 % (ref 11.0–15.0)
Total Lymphocyte: 15.3 %
WBC: 11.9 10*3/uL — ABNORMAL HIGH (ref 3.8–10.8)

## 2022-07-01 LAB — MAGNESIUM: Magnesium: 1.8 mg/dL (ref 1.5–2.5)

## 2022-07-03 ENCOUNTER — Other Ambulatory Visit: Payer: Self-pay

## 2022-07-03 DIAGNOSIS — R252 Cramp and spasm: Secondary | ICD-10-CM

## 2022-07-03 MED ORDER — GABAPENTIN 300 MG PO CAPS: 300.0000 mg | ORAL_CAPSULE | Freq: Every day | ORAL | 3 refills | Status: DC

## 2022-08-07 ENCOUNTER — Other Ambulatory Visit: Payer: Self-pay | Admitting: Cardiology

## 2022-08-07 DIAGNOSIS — K7030 Alcoholic cirrhosis of liver without ascites: Secondary | ICD-10-CM

## 2022-08-08 ENCOUNTER — Other Ambulatory Visit: Payer: Self-pay

## 2022-08-08 MED ORDER — METOPROLOL TARTRATE 50 MG PO TABS: 50.0000 mg | ORAL_TABLET | Freq: Two times a day (BID) | ORAL | 0 refills | Status: DC

## 2022-08-08 NOTE — Telephone Encounter (Signed)
Patient called wanting a refill on the medication  Metoprolol Tartrate '50mg'$ . Patient had not been seen in the office since 08/17/21 and did not have  a follow up appointment. Informed patient that he would need to schedule an appointment for his refill. Patient scheduled for 08/17/2022 and a refill with a quanity of 18 was sent without refills until his office visit.

## 2022-08-17 ENCOUNTER — Ambulatory Visit: Payer: Medicare Other | Admitting: Student

## 2022-08-17 ENCOUNTER — Ambulatory Visit: Payer: Medicare Other

## 2022-08-17 VITALS — BP 113/62 | HR 64 | Temp 98.6°F | Resp 16 | Ht 70.0 in | Wt 185.0 lb

## 2022-08-17 DIAGNOSIS — K7030 Alcoholic cirrhosis of liver without ascites: Secondary | ICD-10-CM | POA: Diagnosis not present

## 2022-08-17 DIAGNOSIS — I4821 Permanent atrial fibrillation: Secondary | ICD-10-CM | POA: Diagnosis not present

## 2022-08-17 DIAGNOSIS — I2510 Atherosclerotic heart disease of native coronary artery without angina pectoris: Secondary | ICD-10-CM

## 2022-08-17 DIAGNOSIS — I10 Essential (primary) hypertension: Secondary | ICD-10-CM | POA: Diagnosis not present

## 2022-08-17 DIAGNOSIS — G4762 Sleep related leg cramps: Secondary | ICD-10-CM

## 2022-08-17 MED ORDER — MAGNESIUM CHLORIDE 64 MG PO TBEC: 1.0000 | DELAYED_RELEASE_TABLET | Freq: Every evening | ORAL | Status: DC | PRN

## 2022-08-17 MED ORDER — MAGNESIUM CHLORIDE 64 MG PO TBEC: 1.0000 | DELAYED_RELEASE_TABLET | Freq: Every evening | ORAL | Status: DC

## 2022-08-17 MED ORDER — METOPROLOL TARTRATE 50 MG PO TABS: 50.0000 mg | ORAL_TABLET | Freq: Two times a day (BID) | ORAL | 0 refills | Status: DC

## 2022-08-17 NOTE — Progress Notes (Signed)
Primary Physician/Referring:  Susy Frizzle, MD  Patient ID: Samuel Flores, male    DOB: Aug 04, 1941, 81 y.o.   MRN: [id number]  Chief Complaint  Patient presents with   Follow-up    1 year follow-up for atrial fibrillation    HPI:    Samuel Flores  is a 81 y.o.  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  He is not on anticoagulation due to underlying cirrhosis and high risk for bleeding complications.  He presents today for 1 year follow-up. He is presently doing well and has remained abstinent from alcohol.  He has not had any recent hospitalization.  He does have mild chronic dyspnea and cough which is unchanged.  No leg edema, no PND or orthopnea.  He does endorse bilateral leg cramps at night with associated numbness in toes that has been occurring for the past several months. Cramping lasts several minutes and often resolves with ambulation. He walks 1 mile per day and does not have leg cramping or pain during exertion.  Past Medical History:  Diagnosis Date   Alcohol abuse, daily use 54/12/7060   Alcoholic cirrhosis of liver with ascites ([place]) 08/01/2018   Arthritis    osteoarthritis   CAD (coronary artery disease)    Non obstructive   Cancer ([place]) 1994   prostate   CHF (congestive heart failure) ([place])    Diastolic dysfunction Echo 2012   Grade 1   Essential hypertension 07/15/2013   Gout    Hx of subdural hematoma 03/2011   Hx-TIA (transient ischemic attack) 2011   Hypercholesteremia    Hypertension    Stroke [facility])    ? mini stroke    Past Surgical History:  Procedure Laterality Date   BURR HOLE FOR SUBDURAL HEMATOMA  03/2011   CARDIOVERSION N/A 02/20/2017   Procedure: CARDIOVERSION;  Surgeon: Adrian Prows, MD;   Location: [place];  Service: Cardiovascular;  Laterality: N/A;   CHOLECYSTECTOMY N/A 08/08/2018   Procedure: LAPAROSCOPIC CHOLECYSTECTOMY;  Surgeon: Stark Klein, MD;  [facility];  Service: General;  Laterality: N/A;   EYE SURGERY Left 03/2009   cataract   LEFT HEART CATHETERIZATION WITH CORONARY ANGIOGRAM N/A 01/02/2013   Procedure: LEFT HEART CATHETERIZATION WITH CORONARY ANGIOGRAM;  Surgeon: Laverda Page, MD;  Location[facility] CATH LAB;  Service: Cardiovascular;  Laterality: N/A;   NASAL FLAP ROTATION Right 04/18/2021   Procedure: Reconstruction of right nasal Mohs defect with flaps and grafts as necessary;  Surgeon: Cindra Presume, MD;  Location: [place];  Service: Plastics;  Laterality: Right;   PROSTATE SURGERY  1994   SKIN SURGERY     SPINE SURGERY     cervical   Social History   Tobacco Use   Smoking status: Former    Types: Pipe    Quit date: 04/09/2013    Years since quitting: 9.3   Smokeless tobacco: Never  Substance Use Topics   Alcohol use: Not Currently    Comment: Pt has not had alcohol in 15 months   Marital Status: Married   ROS  Review of Systems  Cardiovascular:  Positive for dyspnea on exertion (stable, unchanged). Negative for chest pain, leg swelling, orthopnea and paroxysmal nocturnal dyspnea.  Musculoskeletal:  Positive for muscle cramps (BLE).  Gastrointestinal:  Negative for melena.  Neurological:  Negative for dizziness.   Objective  Blood pressure 113/62, pulse 64, temperature 98.6 F (37 C), temperature source Temporal, resp. rate 16, height '5\' 10"'$  (1.778 m), weight 185 lb (83.9 kg), SpO2 97 %. Body mass index is 26.54 kg/m.     08/17/2022    1:01 PM 08/17/2022   12:56 PM 06/30/2022    2:06 PM  Vitals with BMI  Height  '5\' 10"'$  '5\' 10"'$   Weight  185 lbs 182 lbs 13 oz  BMI  09.73 53.29  Systolic 924 97 268  Diastolic 62 60 64  Pulse 64 86 94     Physical Exam Constitutional:      Appearance: He is well-developed.  Neck:     Thyroid: No  thyromegaly.     Vascular: No JVD.  Cardiovascular:     Rate and Rhythm: Normal rate. Rhythm irregular.     Pulses: Normal pulses and intact distal pulses.          Dorsalis pedis pulses are 2+ on the right side and 2+ on the left side.       Posterior tibial pulses are 2+ on the right side and 2+ on the left side.     Heart sounds: No murmur heard.    No gallop. No S3 or S4 sounds.  Pulmonary:     [place]: Pulmonary effort is normal.     Breath sounds: Normal breath sounds.  Abdominal:     General: Bowel sounds are normal. There is distension (ascites, minimal).     Palpations: Abdomen is soft.  Musculoskeletal:     Right lower leg: Normal. No edema.     Left lower leg: Normal. No edema.     Right foot: Normal.     Left foot: Normal.  Neurological:     Mental Status: He is alert.    Radiology: No results found.  Laboratory examination:   Recent Labs    06/30/22 1420  NA 139  K 4.1  CL 101  CO2 29  GLUCOSE 101*  BUN 15  CREATININE 1.04  CALCIUM 9.2      Latest Ref Rng & Units 06/30/2022    2:20 PM 03/18/2021    3:35 PM 07/05/2020    3:58 PM  CMP  Glucose 65 - 99 mg/dL 101  106  101   BUN 7 - 25 mg/dL '15  19  19   '$ Creatinine 0.70 - 1.22 mg/dL 1.04  1.19  1.09   Sodium 135 - 146 mmol/L 139  139  137   Potassium 3.5 - 5.3 mmol/L 4.1  4.8  4.1   Chloride 98 - 110 mmol/L 101  100  103   CO2 20 - 32 mmol/L '29  28  24   '$ Calcium 8.6 - 10.3 mg/dL 9.2  9.3  9.2   Total Protein 6.1 - 8.1 g/dL 6.5  6.4  6.5   Total Bilirubin 0.2 - 1.2 mg/dL 0.8  0.6  0.7   AST 10 - 35 U/L '16  18  23   '$ ALT 9 - 46 U/L '10  12  24       '$ Latest Ref Rng & Units 06/30/2022    2:20 PM 03/18/2021    3:35 PM 07/05/2020    3:58 PM  CBC  WBC 3.8 - 10.8 Thousand/uL 11.9  10.4  12.4   Hemoglobin 13.2 - 17.1 g/dL 15.8  15.9  16.2   Hematocrit 38.5 - 50.0 % 44.9  47.4  46.8   Platelets 140 - 400 Thousand/uL 268  245  230    Lipid Panel     Component Value Date/Time   CHOL 139 07/05/2020 1558    TRIG 176 (H) 07/05/2020 1558   HDL 30 (L) 07/05/2020 1558   CHOLHDL 4.6 07/05/2020 1558   VLDL 27 10/24/2016 1004   LDLCALC 82 07/05/2020 1558   HEMOGLOBIN A1C Lab Results  Component Value Date   HGBA1C 5.8 (H) 07/29/2018   MPG 120 07/29/2018   TSH No results for input(s): "TSH" in the last 8760 hours. Medications   Current Outpatient Medications on File Prior to Visit  Medication Sig Dispense Refill   allopurinol (ZYLOPRIM) 100 MG tablet TAKE 2 TABLETS BY MOUTH EVERY DAY 180 tablet 2   diltiazem (CARDIZEM CD) 180 MG 24 hr capsule TAKE 1 CAPSULE BY MOUTH EVERY DAY 90 capsule 1   folic acid (FOLVITE) 865 MCG tablet Take 800 mcg by mouth daily.     gabapentin (NEURONTIN) 300 MG capsule Take 1 capsule (300 mg total) by mouth at bedtime. 90 capsule 3   spironolactone (ALDACTONE) 50 MG tablet TAKE 1 TABLET BY MOUTH EVERY DAY 90 tablet 3   silver sulfADIAZINE (SILVADENE) 1 % cream APPLY A SMALL AMOUNT TO AFFECTED AREA TWICE A DAY AS NEEDED (Patient not taking: Reported on 06/30/2022)     No current facility-administered medications on file prior to visit.    Cardiac Studies:   Coronary angiogram 01/10/2013: Severe diffuse coronary calcification with scattered 20-30% stenosis. Normal LVEF.   Echocardiogram 08/07/2018: - Left ventricle: The cavity size was normal. Wall thickness was  increased in a pattern of mild LVH. Systolic function was mildly  reduced. The estimated ejection fraction was in the range of 45%   to 50%. Diffuse hypokinesis. The study is not technically sufficient to allow evaluation of LV diastolic function. - Mitral valve: Mildly thickened leaflets . There was mild  regurgitation. - Left atrium: Severely dilated. - Right ventricle: Moderately dilated and moderately hypokinetic. - Right atrium: The atrium was mildly dilated. - Tricuspid valve: There was mild regurgitation. - Pulmonary arteries: PA peak pressure: 40 mm Hg (S) + RAP. - Systemic veins: IVC is not  visualized.  EKG:    EKG 08/17/2022: Atrial fibrillation with controlled ventricular sponsor rate of 78 bpm, frequent PVCs, left axis deviation, left anterior fascicular block.  Poor R wave progression, cannot exclude anteroseptal infarct old.  Nonspecific T abnormality.   Assessment     ICD-10-CM   1. Coronary artery disease involving native coronary artery of native heart without angina pectoris  I25.10     2. Permanent atrial fibrillation (HCC)  I48.21 EKG 12-Lead    metoprolol tartrate (LOPRESSOR) 50 MG tablet    3. Essential hypertension  I10     4. Alcoholic cirrhosis of liver without ascites (HCC)  K70.30     5. Nocturnal leg cramps  G47.62 metoprolol tartrate (LOPRESSOR) 50 MG tablet    magnesium chloride (SLOW-MAG) 64 MG SR tablet 64 mg    DISCONTINUED: magnesium chloride (SLOW-MAG) 64 MG SR tablet 64 mg      Meds ordered this encounter  Medications   DISCONTD: magnesium chloride (SLOW-MAG) 64 MG SR tablet 64 mg   metoprolol tartrate (LOPRESSOR) 50 MG tablet    Sig: Take 1 tablet (50 mg total) by mouth 2 (two) times daily.    Dispense:  18 tablet    Refill:  0    Order Specific Question:  Supervising Provider    Answer:   Adrian Prows [[id number]]   magnesium chloride (SLOW-MAG) 64 MG SR tablet 64 mg   Medications Discontinued During This Encounter  Medication Reason   metoprolol tartrate (LOPRESSOR) 50 MG tablet Reorder   magnesium chloride (SLOW-MAG) 64 MG SR tablet 64 mg       Recommendations:   Coronary artery disease involving native coronary artery of native heart without angina pectoris Essential hypertension Blood pressure is well controlled, no changes to medications Discussed exercise at least 30 minutes 5 times per week and low sodium diet less than '2000mg'$  daily  Permanent atrial fibrillation (HCC) Afib remains rate controlled and CBC is stable He is not on anticoagulation due to underlying cirrhosis and high risk for bleeding complications Continues on  Diltiazem '180mg'$  daily and Metoprolol '50mg'$  twice daily  Alcoholic cirrhosis of liver without ascites ([place]) He has remained abstinent from alcohol Labs reviewed, liver functions has remained stable, no significant ascites on exam   Nocturnal leg cramping Symptoms only occur at rest, mostly at night when laying in bed Low suspicion for PAD given physical exam and symptoms, most likely cramping is due to restless leg syndrome Encouraged compression stockings and ordered slow magnesium to take nightly as needed for leg cramping If symptoms worsen or do not improve could consider BLE lower arterial duplex  Overall, he is doing well from cardiac standpoint  Will follow-up in 3 months for BLE cramping    Ernst Spell, AGNP-C 08/17/2022, 2:09 PM Office: [phone]

## 2022-08-21 ENCOUNTER — Other Ambulatory Visit: Payer: Self-pay

## 2022-08-21 DIAGNOSIS — G4762 Sleep related leg cramps: Secondary | ICD-10-CM

## 2022-08-21 DIAGNOSIS — I4821 Permanent atrial fibrillation: Secondary | ICD-10-CM

## 2022-08-21 MED ORDER — METOPROLOL TARTRATE 50 MG PO TABS: 50.0000 mg | ORAL_TABLET | Freq: Two times a day (BID) | ORAL | 3 refills | Status: DC

## 2022-08-28 ENCOUNTER — Ambulatory Visit: Payer: Medicare Other

## 2022-08-28 ENCOUNTER — Ambulatory Visit (INDEPENDENT_AMBULATORY_CARE_PROVIDER_SITE_OTHER): Payer: Medicare Other

## 2022-08-28 DIAGNOSIS — Z23 Encounter for immunization: Secondary | ICD-10-CM

## 2022-08-28 NOTE — Progress Notes (Signed)
  Pt given high dose flu vaccine on l-upper arm. Pt tol well. No c/o per pt and left ambulatory.

## 2022-09-22 ENCOUNTER — Other Ambulatory Visit: Payer: Self-pay | Admitting: Family Medicine

## 2022-09-22 NOTE — Telephone Encounter (Signed)
Requested medications are due for refill today.  yes  Requested medications are on the active medications list.  yes  Last refill. 12/26/2021 #180 2 rf  Future visit scheduled.   no  Notes to clinic.  Labs are expired.    Requested Prescriptions  Pending Prescriptions Disp Refills   allopurinol (ZYLOPRIM) 100 MG tablet [Pharmacy Med Name: ALLOPURINOL 100 MG TABLET] 180 tablet 2    Sig: TAKE 2 TABLETS BY MOUTH EVERY DAY     Endocrinology:  Gout Agents - allopurinol Failed - 09/22/2022  2:17 PM      Failed - Uric Acid in normal range and within 360 days    Uric Acid, Serum  Date Value Ref Range Status  02/27/2017 4.4 4.0 - 8.0 mg/dL Final         Failed - Valid encounter within last 12 months    Recent Outpatient Visits           1 year ago Pre-operative clearance   [place] Susy Frizzle, MD   2 years ago Essential hypertension   [place], [place] Nunnery, MD   2 years ago Routine general medical examination at a health care facility   [place], [place] Nunnery, MD   2 years ago Disorder of both eustachian tubes   [place], Modena Nunnery, MD   3 years ago Alcoholic cirrhosis of liver without ascites ([place])   [place], Mod[place] Nunnery, MD       Future Appointments             In 1 month Ernst Spell, NP [facility]            Passed - Cr in normal range and within 360 days    Creat  Date Value Ref Range Status  06/30/2022 1.04 0.70 - 1.22 mg/dL Final         Passed - CBC within normal limits and completed in the last 12 months    WBC  Date Value Ref Range Status  06/30/2022 11.9 (H) 3.8 - 10.8 Thousand/uL Final   RBC  Date Value Ref Range Status  06/30/2022 4.90 4.20 - 5.80 Million/uL Final   Hemoglobin  Date Value Ref Range Status  06/30/2022 15.8 13.2 - 17.1 g/dL Final   HCT  Date Value Ref Range Status   06/30/2022 44.9 38.5 - 50.0 % Final   MCHC  Date Value Ref Range Status  06/30/2022 35.2 32.0 - 36.0 g/dL Final   [facility]  Date Value Ref Range Status  06/30/2022 32.2 27.0 - 33.0 pg Final   MCV  Date Value Ref Range Status  06/30/2022 91.6 80.0 - 100.0 fL Final   No results found for: "PLTCOUNTKUC", "LABPLAT", "POCPLA" RDW  Date Value Ref Range Status  06/30/2022 13.2 11.0 - 15.0 % Final

## 2022-09-29 ENCOUNTER — Other Ambulatory Visit: Payer: Self-pay | Admitting: Cardiology

## 2022-09-29 DIAGNOSIS — K7030 Alcoholic cirrhosis of liver without ascites: Secondary | ICD-10-CM

## 2022-11-03 ENCOUNTER — Other Ambulatory Visit: Payer: Self-pay | Admitting: Family Medicine

## 2022-11-03 ENCOUNTER — Telehealth: Payer: Self-pay

## 2022-11-03 MED ORDER — AZITHROMYCIN 250 MG PO TABS: ORAL_TABLET | ORAL | 0 refills | Status: DC

## 2022-11-03 NOTE — Telephone Encounter (Signed)
Pt's wife called and states pt has had congestion and a "deep" cough for the last week. Pt has been using OTC cold medication without much relief. No fever. Lelon Frohlich asks if an antibiotic can be sent in for the patient. Covid test not done per Lelon Frohlich because, "We don't have one here at the house." Thank you.

## 2022-11-08 ENCOUNTER — Ambulatory Visit: Payer: Self-pay | Admitting: Internal Medicine

## 2022-11-08 DIAGNOSIS — I4821 Permanent atrial fibrillation: Secondary | ICD-10-CM

## 2022-11-10 ENCOUNTER — Ambulatory Visit: Payer: Medicare Other

## 2022-11-21 ENCOUNTER — Ambulatory Visit (INDEPENDENT_AMBULATORY_CARE_PROVIDER_SITE_OTHER): Payer: Medicare Other

## 2022-11-21 VITALS — BP 116/70 | HR 82 | Ht 70.0 in | Wt 183.0 lb

## 2022-11-21 DIAGNOSIS — Z0000 Encounter for general adult medical examination without abnormal findings: Secondary | ICD-10-CM

## 2022-11-21 NOTE — Patient Instructions (Addendum)
Samuel Flores , Thank you for taking time to come for your Medicare Wellness Visit. I appreciate your ongoing commitment to your health goals. Please review the following plan we discussed and let me know if I can assist you in the future.   These are the goals we discussed:  Goals      Patient Stated     Maintain independence         This is a list of the screening recommended for you and due dates:  Health Maintenance  Topic Date Due   DTaP/Tdap/Td vaccine (1 - Tdap) Never done   Zoster (Shingles) Vaccine (1 of 2) Never done   Pneumonia Vaccine (2 - PPSV23 or PCV20) 01/18/2018   COVID-19 Vaccine (3 - Pfizer risk series) 04/26/2020   Medicare Annual Wellness Visit  11/22/2023   Flu Shot  Completed   HPV Vaccine  Aged Out    Advanced directives: Advance directive discussed with you today. I have provided a copy for you to complete at home and have notarized. Once this is complete please bring a copy in to our office so we can scan it into your chart.   Conditions/risks identified: Aim for 30 minutes of exercise or brisk walking, 6-8 glasses of water, and 5 servings of fruits and vegetables each day.   Next appointment: Follow up in one year for your annual wellness visit.   Preventive Care 70 Years and Older, Male  Preventive care refers to lifestyle choices and visits with your health care provider that can promote health and wellness. What does preventive care include? A yearly physical exam. This is also called an annual well check. Dental exams once or twice a year. Routine eye exams. Ask your health care provider how often you should have your eyes checked. Personal lifestyle choices, including: Daily care of your teeth and gums. Regular physical activity. Eating a healthy diet. Avoiding tobacco and drug use. Limiting alcohol use. Practicing safe sex. Taking low doses of aspirin every day. Taking vitamin and mineral supplements as recommended by your health care  provider. What happens during an annual well check? The services and screenings done by your health care provider during your annual well check will depend on your age, overall health, lifestyle risk factors, and family history of disease. Counseling  Your health care provider may ask you questions about your: Alcohol use. Tobacco use. Drug use. Emotional well-being. Home and relationship well-being. Sexual activity. Eating habits. History of falls. Memory and ability to understand (cognition). Work and work Statistician. Screening  You may have the following tests or measurements: Height, weight, and BMI. Blood pressure. Lipid and cholesterol levels. These may be checked every 5 years, or more frequently if you are over 84 years old. Skin check. Lung cancer screening. You may have this screening every year starting at age 84 if you have a 30-pack-year history of smoking and currently smoke or have quit within the past 15 years. Fecal occult blood test (FOBT) of the stool. You may have this test every year starting at age 84. Flexible sigmoidoscopy or colonoscopy. You may have a sigmoidoscopy every 5 years or a colonoscopy every 10 years starting at age 84. Prostate cancer screening. Recommendations will vary depending on your family history and other risks. Hepatitis C blood test. Hepatitis B blood test. Sexually transmitted disease (STD) testing. Diabetes screening. This is done by checking your blood sugar (glucose) after you have not eaten for a while (fasting). You may have this done  every 1-3 years. Abdominal aortic aneurysm (AAA) screening. You may need this if you are a current or former smoker. Osteoporosis. You may be screened starting at age 84 if you are at high risk. Talk with your health care provider about your test results, treatment options, and if necessary, the need for more tests. Vaccines  Your health care provider may recommend certain vaccines, such  as: Influenza vaccine. This is recommended every year. Tetanus, diphtheria, and acellular pertussis (Tdap, Td) vaccine. You may need a Td booster every 10 years. Zoster vaccine. You may need this after age 84. Pneumococcal 13-valent conjugate (PCV13) vaccine. One dose is recommended after age 84. Pneumococcal polysaccharide (PPSV23) vaccine. One dose is recommended after age 2. Talk to your health care provider about which screenings and vaccines you need and how often you need them. This information is not intended to replace advice given to you by your health care provider. Make sure you discuss any questions you have with your health care provider. Document Released: 11/26/2015 Document Revised: 07/19/2016 Document Reviewed: 08/31/2015 Elsevier Interactive Patient Education  2017 [place] Prevention in the Home Falls can cause injuries. They can happen to people of all ages. There are many things you can do to make your home safe and to help prevent falls. What can I do on the outside of my home? Regularly fix the edges of walkways and driveways and fix any cracks. Remove anything that might make you trip as you walk through a door, such as a raised step or threshold. Trim any bushes or trees on the path to your home. Use bright outdoor lighting. Clear any walking paths of anything that might make someone trip, such as rocks or tools. Regularly check to see if handrails are loose or broken. Make sure that both sides of any steps have handrails. Any raised decks and porches should have guardrails on the edges. Have any leaves, snow, or ice cleared regularly. Use sand or salt on walking paths during winter. Clean up any spills in your garage right away. This includes oil or grease spills. What can I do in the bathroom? Use night lights. Install grab bars by the toilet and in the tub and shower. Do not use towel bars as grab bars. Use non-skid mats or decals in the tub or  shower. If you need to sit down in the shower, use a plastic, non-slip stool. Keep the floor dry. Clean up any water that spills on the floor as soon as it happens. Remove soap buildup in the tub or shower regularly. Attach bath mats securely with double-sided non-slip rug tape. Do not have throw rugs and other things on the floor that can make you trip. What can I do in the bedroom? Use night lights. Make sure that you have a light by your bed that is easy to reach. Do not use any sheets or blankets that are too big for your bed. They should not hang down onto the floor. Have a firm chair that has side arms. You can use this for support while you get dressed. Do not have throw rugs and other things on the floor that can make you trip. What can I do in the kitchen? Clean up any spills right away. Avoid walking on wet floors. Keep items that you use a lot in easy-to-reach places. If you need to reach something above you, use a strong step stool that has a grab bar. Keep electrical cords out of the  way. Do not use floor polish or wax that makes floors slippery. If you must use wax, use non-skid floor wax. Do not have throw rugs and other things on the floor that can make you trip. What can I do with my stairs? Do not leave any items on the stairs. Make sure that there are handrails on both sides of the stairs and use them. Fix handrails that are broken or loose. Make sure that handrails are as long as the stairways. Check any carpeting to make sure that it is firmly attached to the stairs. Fix any carpet that is loose or worn. Avoid having throw rugs at the top or bottom of the stairs. If you do have throw rugs, attach them to the floor with carpet tape. Make sure that you have a light switch at the top of the stairs and the bottom of the stairs. If you do not have them, ask someone to add them for you. What else can I do to help prevent falls? Wear shoes that: Do not have high heels. Have  rubber bottoms. Are comfortable and fit you well. Are closed at the toe. Do not wear sandals. If you use a stepladder: Make sure that it is fully opened. Do not climb a closed stepladder. Make sure that both sides of the stepladder are locked into place. Ask someone to hold it for you, if possible. Clearly mark and make sure that you can see: Any grab bars or handrails. First and last steps. Where the edge of each step is. Use tools that help you move around (mobility aids) if they are needed. These include: Canes. Walkers. Scooters. Crutches. Turn on the lights when you go into a dark area. Replace any light bulbs as soon as they burn out. Set up your furniture so you have a clear path. Avoid moving your furniture around. If any of your floors are uneven, fix them. If there are any pets around you, be aware of where they are. Review your medicines with your doctor. Some medicines can make you feel dizzy. This can increase your chance of falling. Ask your doctor what other things that you can do to help prevent falls. This information is not intended to replace advice given to you by your health care provider. Make sure you discuss any questions you have with your health care provider. Document Released: 08/26/2009 Document Revised: 04/06/2016 Document Reviewed: 12/04/2014 Elsevier Interactive Patient Education  2017 [employer].

## 2022-11-21 NOTE — Progress Notes (Signed)
Subjective:   Samuel Flores is a 82 y.o. male who presents for Medicare Annual/Subsequent preventive examination.  Review of Systems     Cardiac Risk Factors include: advanced age (>73mn, >>61women);male gender;hypertension     Objective:    Today's Vitals   11/21/22 1121  BP: 116/70  Pulse: 82  SpO2: 94%  Weight: 183 lb (83 kg)  Height: '5\' 10"'$  (1.778 m)   Body mass index is 26.26 kg/m.     11/21/2022   11:33 AM 04/18/2021    6:10 AM 03/03/2020    3:00 PM 07/29/2018    8:59 AM 09/27/2017    3:07 AM 02/20/2017   12:08 PM 01/01/2013   10:51 PM  Advanced Directives  Does Patient Have a Medical Advance Directive? No No Yes No Yes Yes Patient has advance directive, copy not in chart  Type of Advance Directive   Living will  Living will;Healthcare Power of [place] will  Does patient want to make changes to medical advance directive?   Yes (MAU/Ambulatory/Procedural Areas - Information given)  Yes (Inpatient - patient requests chaplain consult to change a medical advance directive)    Would patient like information on creating a medical advance directive? Yes (MAU/Ambulatory/Procedural Areas - Information given) No - Patient declined  No - Patient declined     Pre-existing out of facility DNR order (yellow form or pink MOST form)       No    Current Medications (verified) Outpatient Encounter Medications as of 11/21/2022  Medication Sig   allopurinol (ZYLOPRIM) 100 MG tablet TAKE 2 TABLETS BY MOUTH EVERY DAY   diltiazem (CARDIZEM CD) 180 MG 24 hr capsule TAKE 1 CAPSULE BY MOUTH EVERY DAY   folic acid (FOLVITE) 8676MCG tablet Take 800 mcg by mouth daily.   metoprolol tartrate (LOPRESSOR) 50 MG tablet Take 1 tablet (50 mg total) by mouth 2 (two) times daily.   spironolactone (ALDACTONE) 50 MG tablet TAKE 1 TABLET BY MOUTH EVERY DAY   [DISCONTINUED] azithromycin (ZITHROMAX) 250 MG tablet 2 tabs poqday1, 1 tab poqday 2-5    [DISCONTINUED] silver sulfADIAZINE (SILVADENE) 1 % cream APPLY A SMALL AMOUNT TO AFFECTED AREA TWICE A DAY AS NEEDED   [DISCONTINUED] gabapentin (NEURONTIN) 300 MG capsule Take 1 capsule (300 mg total) by mouth at bedtime. (Patient not taking: Reported on 11/21/2022)   Facility-Administered Encounter Medications as of 11/21/2022  Medication   magnesium chloride (SLOW-MAG) 64 MG SR tablet 64 mg    Allergies (verified) Patient has no known allergies.   History: Past Medical History:  Diagnosis Date   Alcohol abuse, daily use 172/0/9470  Alcoholic cirrhosis of liver with ascites ([facility] 08/01/2018   Arthritis    osteoarthritis   CAD (coronary artery disease)    Non obstructive   [place] [place]   prostate   CHF (congestive heart failure) ([facility]    Diastolic dysfunction Echo 2012   Grade 1   Essential hypertension 07/15/2013   Gout    Hx of subdural hematoma 03/2011   Hx-TIA (transient ischemic attack) 2011   Hypercholesteremia    Hypertension    [facility]roke [facility]    ? mini stroke    Past Surgical History:  Procedure Laterality Date   BURR HOLE FOR SUBDURAL HEMATOMA  03/2011   CARDIOVERSION N/A 02/20/2017   Procedure: CARDIOVERSION;  Surgeon: JAdrian Prows MD;  [facility]  Service: Cardiovascular;  Laterality: N/A;   CHOLECYSTECTOMY N/A 08/08/2018  Procedure: LAPAROSCOPIC CHOLECYSTECTOMY;  Surgeon: Stark Klein, MD;  Location: [facility];  Service: General;  Laterality: N/A;   EYE SURGERY Left 03/2009   cataract   LEFT HEART CATHETERIZATION WITH CORONARY ANGIOGRAM N/A 01/02/2013   Procedure: LEFT HEART CATHETERIZATION WITH CORONARY ANGIOGRAM;  Surgeon: Laverda Page, MD;  Location: [facility] C[facility] LAB;  Service: Cardiovascular;  Laterality: N/A;   NASAL FLAP ROTATION Right 04/18/2021   Procedure: Reconstruction of right nasal Mohs defect with flaps and grafts as necessary;  Surgeon: Cindra Presume, MD;  Location: [facility];  Service: Plastics;  Laterality: Right;   PROSTATE SURGERY  1994    SKIN SURGERY     SPINE SURGERY     cervical   Family History  Problem Relation Age of Onset   Lung cancer Mother        never smoker   Prostate cancer Father    Hypertension Sister    Hypertension Sister    Pancreatic cancer Brother    Social History   Socioeconomic History   Marital status: Married    Spouse name: Not on file   Number of children: 2   Years of education: Not on file   Highest education level: Not on file  Occupational History   Occupation: Previous work at Office manager course    Employer: RETIRED  Tobacco Use   Smoking status: Former    Types: Pipe    Quit date: 04/09/2013    Years since quitting: 9.6   Smokeless tobacco: Never  Vaping Use   Vaping Use: Never used  Substance and Sexual Activity   Alcohol use: Not Currently    Comment: Pt has not had alcohol in 15 months    Drug use: No   Sexual activity: Never  Other Topics Concern   Not on file  Social History Narrative   Not on file   Social Determinants of Health   Financial Resource Strain: Low Risk  (11/21/2022)   Overall Financial Resource Strain (CARDIA)    Difficulty of Paying Living Expenses: Not hard at all  Food Insecurity: No [place] (11/21/2022)   Hunger Vital Sign    Worried About Running Out of Food in the Last Year: Never true    Reform in the Last Year: Never true  Transportation Needs: No Transportation Needs (11/21/2022)   PRAPARE - Hydrologist (Medical): No    Lack of Transportation (Non-Medical): No  Physical Activity: Inactive (11/21/2022)   Exercise Vital Sign    Days of Exercise per Week: 0 days    Minutes of Exercise per Session: 0 min  Stress: No Stress Concern Present (11/21/2022)   [place]    Feeling of Stress : Not at all  Social Connections: Moderately Integrated (11/21/2022)   Social Connection and Isolation Panel [NHANES]    Frequency of Communication with  Friends and Family: More than three times a week    Frequency of Social Gatherings with Friends and Family: Three times a week    Attends Religious Services: More than 4 times per year    Active Member of Clubs or Organizations: No    Attends Archivist Meetings: Never    Marital Status: Married    Tobacco Counseling Counseling given: Not Answered   Clinical Intake:  Pre-visit preparation completed: Yes  Pain : No/denies pain     Diabetes: No  How often do you need to have  someone help you when you read instructions, pamphlets, or other written materials from your doctor or pharmacy?: 1 - Never  Diabetic?No   Interpreter Needed?: No  Information entered by :: Denman George LPN   Activities of Daily Living    11/21/2022   11:33 AM  In your present state of health, do you have any difficulty performing the following activities:  Hearing? 0  Vision? 0  Difficulty concentrating or making decisions? 0  Walking or climbing stairs? 0  Dressing or bathing? 0  Doing errands, shopping? 0  Preparing Food and eating ? N  Using the Toilet? N  In the past six months, have you accidently leaked urine? N  Do you have problems with loss of bowel control? N  Managing your Medications? N  Managing your Finances? N  Housekeeping or managing your Housekeeping? N    Patient Care Team: Susy Frizzle, MD as PCP - General (Family Medicine) Edythe Clarity, [facility] as Pharmacist (Pharmacist) Adrian Prows, MD as Consulting Physician (Cardiology) Floydene Flock, DO as Consulting Physician (Cardiology)  Indicate any recent Medical Services you may have received from other than [facility] providers in the past year (date may be approximate).     Assessment:   This is a routine wellness examination for [place].  Hearing/Vision screen Hearing Screening - Comments:: Some hearing difficulty noted   Vision Screening - Comments::  up to date with routine eye exams    Dietary  issues and exercise activities discussed: Current Exercise Habits: The patient does not participate in regular exercise at present   Goals Addressed             This Visit's Progress    Patient Stated       Maintain independence        Depression Screen    11/21/2022   11:32 AM 03/18/2021    3:02 PM 03/03/2020    2:58 PM 09/01/2019    2:28 PM 08/20/2018    9:32 AM 11/23/2017    2:14 PM 02/27/2017    2:45 PM  PHQ 2/9 Scores  PHQ - 2 Score 0 0 0 0 0 0 0  PHQ- 9 Score   0    0    Fall Risk    03/18/2021    3:02 PM 03/03/2020    2:58 PM 09/01/2019    2:27 PM 08/20/2018    9:32 AM 11/23/2017    2:14 PM  Fall Risk   Falls in the past year? 0 0 0 No No  Risk for fall due to : No Fall Risks No Fall Risks     Follow up Falls evaluation completed Falls evaluation completed Falls evaluation completed      [place]:  Any stairs in or around the home? No  If so, are there any without handrails? No  Home free of loose throw rugs in walkways, pet beds, electrical cords, etc? Yes  Adequate lighting in your home to reduce risk of falls? Yes   ASSISTIVE DEVICES UTILIZED TO PREVENT FALLS:  Life alert? No  Use of a cane, walker or w/c? No  Grab bars in the bathroom? Yes  Shower chair or bench in shower? No  Elevated toilet seat or a handicapped toilet? Yes   TIMED UP AND GO:  Was the test performed? Yes .  Length of time to ambulate 10 feet: 8 sec.   Gait slow and steady without use of assistive device  Cognitive  Function:        11/21/2022   11:33 AM  6CIT Screen  What Year? 0 points  What month? 0 points  What time? 0 points  Count back from 20 0 points  Months in reverse 0 points  Repeat phrase 2 points  Total Score 2 points    Immunizations Immunization History  Administered Date(s) Administered   Fluad Quad(high Dose 65+) 10/20/2020, 08/31/2021, 08/28/2022   Influenza, High Dose Seasonal PF 08/20/2018   Influenza,inj,Quad  PF,6+ Mos 10/24/2016, 09/11/2017, 09/01/2019   PFIZER(Purple Top)SARS-COV-2 Vaccination 03/04/2020, 03/29/2020   Pneumococcal Conjugate-13 11/23/2017   Zoster, Live 09/05/2011    TDAP status: Due, Education has been provided regarding the importance of this vaccine. Advised may receive this vaccine at local pharmacy or Health Dept. Aware to provide a copy of the vaccination record if obtained from local pharmacy or Health Dept. Verbalized acceptance and understanding.  Flu Vaccine status: Up to date  Pneumococcal vaccine status: Due, Education has been provided regarding the importance of this vaccine. Advised may receive this vaccine at local pharmacy or Health Dept. Aware to provide a copy of the vaccination record if obtained from local pharmacy or Health Dept. Verbalized acceptance and understanding.  Covid-19 vaccine status: Information provided on how to obtain vaccines.   Qualifies for Shingles Vaccine? Yes   Zostavax completed Yes   Shingrix Completed?: No.    Education has been provided regarding the importance of this vaccine. Patient has been advised to call insurance company to determine out of pocket expense if they have not yet received this vaccine. Advised may also receive vaccine at local pharmacy or Health Dept. Verbalized acceptance and understanding.  Screening Tests Health Maintenance  Topic Date Due   DTaP/Tdap/Td (1 - Tdap) Never done   Zoster Vaccines- Shingrix (1 of 2) Never done   Pneumonia Vaccine 25+ Years old (2 - PPSV23 or PCV20) 01/18/2018   COVID-19 Vaccine (3 - Pfizer risk series) 04/26/2020   Medicare Annual Wellness (AWV)  11/22/2023   INFLUENZA VACCINE  Completed   HPV VACCINES  Aged Out    Health Maintenance  Health Maintenance Due  Topic Date Due   DTaP/Tdap/Td (1 - Tdap) Never done   Zoster Vaccines- Shingrix (1 of 2) Never done   Pneumonia Vaccine 32+ Years old (2 - PPSV23 or PCV20) 01/18/2018   COVID-19 Vaccine (3 - Pfizer risk series)  04/26/2020    Colorectal cancer screening: No longer required.   Lung Cancer Screening: (Low Dose CT Chest recommended if Age 46-80 years, 30 pack-year currently smoking OR have quit w/in 15years.) does not qualify.   Lung Cancer Screening Referral: n/a   Additional Screening:  Hepatitis C Screening: does not qualify  Vision Screening: Recommended annual ophthalmology exams for early detection of glaucoma and other disorders of the eye. Is the patient up to date with their annual eye exam?  Yes  Who is the provider or what is the name of the office in which the patient attends annual eye exams? Unable to provide name  If pt is not established with a provider, would they like to be referred to a provider to establish care? No .   Dental Screening: Recommended annual dental exams for proper oral hygiene  Community Resource Referral / Chronic Care Management: CRR required this visit?  No   CCM required this visit?  No      Plan:     I have personally reviewed and noted the following in the patient's  chart:   Medical and social history Use of alcohol, tobacco or illicit drugs  Current medications and supplements including opioid prescriptions. Patient is not currently taking opioid prescriptions. Functional ability and status Nutritional status Physical activity Advanced directives List of other physicians Hospitalizations, surgeries, and ER visits in previous 12 months Vitals Screenings to include cognitive, depression, and falls Referrals and appointments  In addition, I have reviewed and discussed with patient certain preventive protocols, quality metrics, and best practice recommendations. A written personalized care plan for preventive services as well as general preventive health recommendations were provided to patient.     Denman George [place], [place]   03/20/4416   Nurse Notes: No concerns

## 2022-12-04 NOTE — Progress Notes (Signed)
No show

## 2022-12-14 ENCOUNTER — Encounter: Payer: Self-pay | Admitting: Family Medicine

## 2022-12-14 ENCOUNTER — Ambulatory Visit (INDEPENDENT_AMBULATORY_CARE_PROVIDER_SITE_OTHER): Payer: Medicare Other | Admitting: Family Medicine

## 2022-12-14 VITALS — BP 118/62 | HR 60 | Temp 97.6°F | Ht 70.0 in | Wt 178.2 lb

## 2022-12-14 DIAGNOSIS — I4821 Permanent atrial fibrillation: Secondary | ICD-10-CM | POA: Diagnosis not present

## 2022-12-14 DIAGNOSIS — I5030 Unspecified diastolic (congestive) heart failure: Secondary | ICD-10-CM | POA: Diagnosis not present

## 2022-12-14 DIAGNOSIS — K7030 Alcoholic cirrhosis of liver without ascites: Secondary | ICD-10-CM | POA: Diagnosis not present

## 2022-12-14 DIAGNOSIS — Z0000 Encounter for general adult medical examination without abnormal findings: Secondary | ICD-10-CM

## 2022-12-14 NOTE — Progress Notes (Signed)
Subjective:    Patient ID: Samuel Flores, male    DOB: 1941-09-10, 82 y.o.   MRN: [id number]  HPI Patient is an 82 year old Caucasian male here today for complete physical exam.  Past medical history is significant for alcoholic cirrhosis of the liver.  He also has a history of atrial fibrillation.  He also has a history of mildly suppressed ejection fraction congestive heart failure with an EF of 45 to 50% on an echocardiogram in 2019 along with diastolic dysfunction.  I have copied his most recent cardiac studies below: Coronary angiogram 01/10/2013: Severe diffuse coronary calcification with scattered 20-30% stenosis. Normal LVEF.    Echocardiogram 08/07/2018: - Left ventricle: The cavity size was normal. Wall thickness was  increased in a pattern of mild LVH. Systolic function was mildly  reduced. The estimated ejection fraction was in the range of 45%   to 50%. Diffuse hypokinesis. The study is not technically sufficient to allow evaluation of LV diastolic function. - Mitral valve: Mildly thickened leaflets . There was mild  regurgitation. - Left atrium: Severely dilated. - Right ventricle: Moderately dilated and moderately hypokinetic. - Right atrium: The atrium was mildly dilated. - Tricuspid valve: There was mild regurgitation. - Pulmonary arteries: PA peak pressure: 40 mm Hg (S) + RAP. - Systemic veins: IVC is not visualized.  Patient is currently not on anticoagulation due to fall risk based on previous office visits.  He also wants to minimize medication that he is taking.  I did discuss the addition of an SGLT2 inhibitor such as Jardiance to reduce morbidity and mortality given his history of congestive heart failure and diastolic dysfunction.  He is already on spironolactone as well as metoprolol.  Again patient is very hesitant to add any medication and states that he will consider.  He is no longer drinking and he states that he is maintaining his sobriety.  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  He states that the scaly papule that been there for several months.  I recommended following up with his dermatologist however he refuses that today.  He states that he does not want any more treatment at this time.  He is due for an RSV vaccine and a COVID shot.  I recommended both of these to him today Past Medical History:  Diagnosis Date   Alcohol abuse, daily use 68/0/3212   Alcoholic cirrhosis of liver with ascites ([place]) 08/01/2018   Arthritis    osteoarthritis   CAD (coronary artery disease)    Non obstructive   Cancer ([place]) 1994   prostate   CHF (congestive heart failure) ([place])    Diastolic dysfunction Echo 2012   Grade 1   Essential hypertension 07/15/2013   Gout    Hx of subdural hematoma 03/2011   Hx-TIA (transient ischemic attack) 2011   Hypercholesteremia    Hypertension    Stroke [facility])    ? mini stroke    Past Surgical History:  Procedure Laterality Date   BURR HOLE FOR SUBDURAL HEMATOMA  03/2011   CARDIOVERSION N/A 02/20/2017   Procedure: CARDIOVERSION;  Surgeon: Adrian Prows, MD;  Location: [place];  Service: Cardiovascular;  Laterality: N/A;   CHOLECYSTECTOMY N/A 08/08/2018   Procedure: LAPAROSCOPIC CHOLECYSTECTOMY;  Surgeon: Stark Klein, MD;  Location: [facility];  Service: General;  Laterality: N/A;   EYE SURGERY Left 03/2009   cataract   LEFT HEART CATHETERIZATION WITH CORONARY ANGIOGRAM N/A 01/02/2013  Procedure: LEFT HEART CATHETERIZATION WITH CORONARY ANGIOGRAM;  Surgeon: Laverda Page, MD;  Location: [facility] C[facility];  Service: Cardiovascular;  Laterality: N/A;   NASAL FLAP ROTATION Right 04/18/2021   Procedure: Reconstruction of right nasal Mohs defect with flaps and grafts as necessary;  Surgeon: Cindra Presume, MD;  Location: [place];  Service: Plastics;  Laterality: Right;   PROSTATE SURGERY  1994   SKIN SURGERY     SPINE SURGERY     cervical   Current Outpatient Medications on File  Prior to Visit  Medication Sig Dispense Refill   allopurinol (ZYLOPRIM) 100 MG tablet TAKE 2 TABLETS BY MOUTH EVERY DAY 180 tablet 2   diltiazem (CARDIZEM CD) 180 MG 24 hr capsule TAKE 1 CAPSULE BY MOUTH EVERY DAY 90 capsule 1   folic acid (FOLVITE) 284 MCG tablet Take 800 mcg by mouth daily.     metoprolol tartrate (LOPRESSOR) 50 MG tablet Take 1 tablet (50 mg total) by mouth 2 (two) times daily. 90 tablet 3   spironolactone (ALDACTONE) 50 MG tablet TAKE 1 TABLET BY MOUTH EVERY DAY 90 tablet 3   No current facility-administered medications on file prior to visit.   No Known Allergies Social History   Socioeconomic History   Marital status: Married    Spouse name: Not on file   Number of children: 2   Years of education: Not on file   Highest education level: Not on file  Occupational History   Occupation: Previous work at [employer]    Employer: RETIRED  Tobacco Use   Smoking status: Former    Types: Pipe    Quit date: 04/09/2013    Years since quitting: 9.6   Smokeless tobacco: Never  Vaping Use   Vaping Use: Never used  Substance and Sexual Activity   Alcohol use: Not Currently    Comment: Pt has not had alcohol in 15 months    Drug use: No   Sexual activity: Never  Other Topics Concern   Not on file  Social History Narrative   Not on file   Social Determinants of Health   Financial Resource Strain: Low Risk  (11/21/2022)   Overall Financial Resource Strain (CARDIA)    Difficulty of Paying Living Expenses: Not hard at all  Food Insecurity: No [place] (11/21/2022)   Hunger Vital Sign    Worried About Running Out of Food in the Last Year: Never true    [place] in the Last Year: Never true  Transportation Needs: No Transportation Needs (11/21/2022)   PRAPARE - Hydrologist (Medical): No    Lack of Transportation (Non-Medical): No  Physical Activity: Inactive (11/21/2022)   Exercise Vital Sign    Days of Exercise per Week: 0  days    Minutes of Exercise per Session: 0 min  Stress: No Stress Concern Present (11/21/2022)   [place]    Feeling of Stress : Not at all  Social Connections: Moderately Integrated (11/21/2022)   Social Connection and Isolation Panel [NHANES]    Frequency of Communication with Friends and Family: More than three times a week    Frequency of Social Gatherings with Friends and Family: Three times a week    Attends Religious Services: More than 4 times per year    Active Member of Clubs or Organizations: No    Attends Archivist Meetings: Never    Marital Status: Married  Intimate Partner Violence: Not At Risk (11/21/2022)   Humiliation, Afraid, Rape, and Kick questionnaire    Fear of Current or Ex-Partner: No    Emotionally Abused: No    Physically Abused: No    Sexually Abused: No      Review of Systems  All other systems reviewed and are negative.      Objective:   Physical Exam Vitals reviewed.  Constitutional:      General: He is not in acute distress.    Appearance: He is obese. He is not ill-appearing or toxic-appearing.  HENT:     Head: Normocephalic and atraumatic.  Cardiovascular:     Rate and Rhythm: Normal rate. Rhythm irregular.     Heart sounds: Normal heart sounds. No murmur heard.    No friction rub. No gallop.  Pulmonary:     Effort: Pulmonary effort is normal. No respiratory distress.     Breath sounds: Normal breath sounds. No stridor. No wheezing or rhonchi.  Abdominal:     General: Bowel sounds are normal. There is no distension.     Palpations: Abdomen is soft.     Tenderness: There is no abdominal tenderness.  Musculoskeletal:     Right lower leg: No edema.     Left lower leg: No edema.  Skin:    Findings: No erythema or rash.  Neurological:     General: No focal deficit present.     Mental Status: He is alert and oriented to person, place, and time.     Cranial Nerves:  No cranial nerve deficit.     Motor: No weakness.  Psychiatric:        Mood and Affect: Mood normal.        Behavior: Behavior normal.        Thought Content: Thought content normal.           Assessment & Plan:  Permanent atrial fibrillation (HCC)  CHF with left ventricular diastolic dysfunction, NYHA class 2 ([place]) - Plan: CBC with Differential/Platelet, Lipid panel, COMPLETE METABOLIC PANEL WITH GFR  Alcoholic cirrhosis of liver without ascites ([place])  Encounter for annual wellness visit (AWV) in Medicare patient   Patient is already had his pneumonia vaccines and his flu shot.  I recommended an RSV vaccine as well as a COVID booster.  Given his history of congestive heart failure and diastolic dysfunction, I manage he is COVID-negative his overall mortality risk patient declines adding any additional medication today.  He wants to keep his medication to a minimum.  He asked about stopping allopurinol however we discussed its mechanism of action if he elects to continue the medication.  Patient is not due for colonoscopy based on age.  He denies any falls.  He denies any memory loss.  He denies any depression

## 2022-12-15 LAB — COMPLETE METABOLIC PANEL WITH GFR
AG Ratio: 1.5 (calc) (ref 1.0–2.5)
ALT: 8 U/L — ABNORMAL LOW (ref 9–46)
AST: 12 U/L (ref 10–35)
Albumin: 4 g/dL (ref 3.6–5.1)
Alkaline phosphatase (APISO): 109 U/L (ref 35–144)
BUN: 18 mg/dL (ref 7–25)
CO2: 28 mmol/L (ref 20–32)
Calcium: 9.2 mg/dL (ref 8.6–10.3)
Chloride: 100 mmol/L (ref 98–110)
Creat: 1 mg/dL (ref 0.70–1.22)
Globulin: 2.7 g/dL (calc) (ref 1.9–3.7)
Glucose, Bld: 97 mg/dL (ref 65–99)
Potassium: 4.7 mmol/L (ref 3.5–5.3)
Sodium: 138 mmol/L (ref 135–146)
Total Bilirubin: 0.9 mg/dL (ref 0.2–1.2)
Total Protein: 6.7 g/dL (ref 6.1–8.1)
eGFR: 75 mL/min/{1.73_m2} (ref >=60)

## 2022-12-15 LAB — CBC WITH DIFFERENTIAL/PLATELET
Absolute Monocytes: 857 cells/uL (ref 200–950)
Basophils Absolute: 50 cells/uL (ref 0–200)
Basophils Relative: 0.4 %
Eosinophils Absolute: 214 cells/uL (ref 15–500)
Eosinophils Relative: 1.7 %
HCT: 44.3 % (ref 38.5–50.0)
Hemoglobin: 15.4 g/dL (ref 13.2–17.1)
Lymphs Abs: 1399 cells/uL (ref 850–3900)
MCH: 32.4 pg (ref 27.0–33.0)
MCHC: 34.8 g/dL (ref 32.0–36.0)
MCV: 93.3 fL (ref 80.0–100.0)
MPV: 9.8 fL (ref 7.5–12.5)
Monocytes Relative: 6.8 %
Neutro Abs: 10080 cells/uL — ABNORMAL HIGH (ref 1500–7800)
Neutrophils Relative %: 80 %
Platelets: 271 10*3/uL (ref 140–400)
RBC: 4.75 10*6/uL (ref 4.20–5.80)
RDW: 12.8 % (ref 11.0–15.0)
Total Lymphocyte: 11.1 %
WBC: 12.6 10*3/uL — ABNORMAL HIGH (ref 3.8–10.8)

## 2022-12-15 LAB — LIPID PANEL
Cholesterol: 169 mg/dL (ref <200)
HDL: 35 mg/dL — ABNORMAL LOW (ref >=40)
LDL Cholesterol (Calc): 113 mg/dL (calc) — ABNORMAL HIGH
Non-HDL Cholesterol (Calc): 134 mg/dL (calc) — ABNORMAL HIGH (ref <130)
Total CHOL/HDL Ratio: 4.8 (calc) (ref <5.0)
Triglycerides: 103 mg/dL (ref <150)

## 2022-12-27 ENCOUNTER — Other Ambulatory Visit: Payer: Self-pay

## 2022-12-27 MED ORDER — DILTIAZEM HCL ER COATED BEADS 180 MG PO CP24: 180.0000 mg | ORAL_CAPSULE | Freq: Every day | ORAL | 1 refills | Status: DC

## 2022-12-28 DIAGNOSIS — Z23 Encounter for immunization: Secondary | ICD-10-CM | POA: Diagnosis not present

## 2023-02-21 ENCOUNTER — Other Ambulatory Visit: Payer: Self-pay

## 2023-02-21 DIAGNOSIS — I4821 Permanent atrial fibrillation: Secondary | ICD-10-CM

## 2023-02-21 DIAGNOSIS — G4762 Sleep related leg cramps: Secondary | ICD-10-CM

## 2023-02-21 MED ORDER — METOPROLOL TARTRATE 50 MG PO TABS: 50.0000 mg | ORAL_TABLET | Freq: Two times a day (BID) | ORAL | 3 refills | Status: DC

## 2023-06-18 ENCOUNTER — Other Ambulatory Visit: Payer: Self-pay | Admitting: Internal Medicine

## 2023-06-18 ENCOUNTER — Other Ambulatory Visit: Payer: Self-pay | Admitting: Family Medicine

## 2023-06-18 NOTE — Telephone Encounter (Signed)
Prescription Request  06/18/2023  LOV: 12/14/2022  What is the name of the medication or equipment?   allopurinol (ZYLOPRIM) 100 MG tablet  **taken BID; 90 day script requested**  Have you contacted your pharmacy to request a refill? Yes   Which pharmacy would you like this sent to?  [id number] [place], [place] - [address] [place] [address] Phone: [phone] Fax: [phone]    Patient notified that their request is being sent to the clinical staff for review and that they should receive a response within 2 business days.   Please advise pharmacist

## 2023-06-19 MED ORDER — ALLOPURINOL 100 MG PO TABS: 200.0000 mg | ORAL_TABLET | Freq: Every day | ORAL | 0 refills | Status: DC

## 2023-06-19 NOTE — Telephone Encounter (Signed)
Last OV 12/14/22.  Requested Prescriptions  Pending Prescriptions Disp Refills   allopurinol (ZYLOPRIM) 100 MG tablet 180 tablet 0    Sig: Take 2 tablets (200 mg total) by mouth daily.     Endocrinology:  Gout Agents - allopurinol Failed - 06/18/2023  1:05 PM      Failed - Uric Acid in normal range and within 360 days    Uric Acid, Serum  Date Value Ref Range Status  02/27/2017 4.4 4.0 - 8.0 mg/dL Final         Failed - Valid encounter within last 12 months    Recent Outpatient Visits           2 years ago Pre-operative clearance   [facility] Donita Brooks, MD   2 years ago Essential hypertension   [facility] [place], Velna Hatchet, MD   3 years ago Routine general medical examination at a health care facility   [facility] [place], Velna Hatchet, MD   3 years ago Disorder of both eustachian tubes   [facility] [place], Velna Hatchet, MD   3 years ago Alcoholic cirrhosis of liver without ascites (HCC)   [facility] [place], Velna Hatchet, MD       Future Appointments             In 2 months Yates Decamp, MD [facility]            Passed - Cr in normal range and within 360 days    Creat  Date Value Ref Range Status  12/14/2022 1.00 0.70 - 1.22 mg/dL Final         Passed - CBC within normal limits and completed in the last 12 months    WBC  Date Value Ref Range Status  12/14/2022 12.6 (H) 3.8 - 10.8 Thousand/uL Final   RBC  Date Value Ref Range Status  12/14/2022 4.75 4.20 - 5.80 Million/uL Final   Hemoglobin  Date Value Ref Range Status  12/14/2022 15.4 13.2 - 17.1 g/dL Final   HCT  Date Value Ref Range Status  12/14/2022 44.3 38.5 - 50.0 % Final   MCHC  Date Value Ref Range Status  12/14/2022 34.8 32.0 - 36.0 g/dL Final   [facility]  Date Value Ref Range Status  12/14/2022 32.4 27.0 - 33.0 pg Final   MCV  Date Value Ref Range Status  12/14/2022 93.3 80.0 - 100.0 fL  Final   No results found for: "PLTCOUNTKUC", "LABPLAT", "POCPLA" RDW  Date Value Ref Range Status  12/14/2022 12.8 11.0 - 15.0 % Final

## 2023-08-15 ENCOUNTER — Other Ambulatory Visit: Payer: Self-pay | Admitting: Internal Medicine

## 2023-08-15 DIAGNOSIS — G4762 Sleep related leg cramps: Secondary | ICD-10-CM

## 2023-08-15 DIAGNOSIS — I4821 Permanent atrial fibrillation: Secondary | ICD-10-CM

## 2023-08-19 NOTE — Progress Notes (Unsigned)
Cardiology Office Note:  .   Date:  08/21/2023  ID:  Samuel Flores, DOB [birth year], MRN [id number] PCP: Donita Brooks, MD  [facility] Providers Cardiologist:  Yates Decamp, MD    History of Present Illness: .   Samuel Flores is a 82 y.o. Caucasian male with alcoholic cirrhosis of the liver with ascites and COPD,  moderate to severe diffuse calcific coronary artery disease by angiography in 2014 with chronic stable angina, remote TIA, primary hypertension, permanent atrial fibrillation, prior alcohol abuse and alcohol induced thrombocytopenia and cirrhosis without ascites in 2019 he was admitted with hepatic encephalopathy and ascites and hypotension and has remained abstinent from alcohol since that, COPD from prior tobacco use presents for annual visit.    Discussed the use of AI scribe software for clinical note transcription with the patient, who gave verbal consent to proceed.  History of Present Illness   The patient, a long-term abstainer from alcohol, presents with chest pain and leg cramps. The chest pain, which has been ongoing for approximately two to three years, occurs with exertion such as mowing the lawn and subsides upon rest. The patient describes the pain as located in the upper chest area. The leg cramps, which are localized to the ankle, typically begin in the evening around 8:00 PM and are accompanied by involuntary foot jerking. The patient has a history of atrial fibrillation and has been managing it with metoprolol tartrate 50 mg BID and spironolactone 50 mg daily. He has not experienced a stroke. The patient also has a history of liver disease with ascites, which has been stable since abstaining from alcohol five years ago.        Review of Systems  Cardiovascular:  Positive for chest pain. Negative for dyspnea on exertion and leg swelling.    Risk Assessment/Calculations:    CHA2DS2-VASc Score = 6   This indicates a 9.7% annual risk of stroke. The  patient's score is based upon: CHF History: 0 HTN History: 1 Diabetes History: 0 Stroke History: 2 Vascular Disease History: 1 Age Score: 2 Gender Score: 0             Lab Results  Component Value Date   CHOL 169 12/14/2022   HDL 35 (L) 12/14/2022   LDLCALC 113 (H) 12/14/2022   TRIG 103 12/14/2022   CHOLHDL 4.8 12/14/2022   Lab Results  Component Value Date   NA 138 12/14/2022   K 4.7 12/14/2022   CO2 28 12/14/2022   GLUCOSE 97 12/14/2022   BUN 18 12/14/2022   CREATININE 1.00 12/14/2022   CALCIUM 9.2 12/14/2022   EGFR 75 12/14/2022   GFRNONAA 57 (L) 03/18/2021   Lab Results  Component Value Date   WBC 12.6 (H) 12/14/2022   HGB 15.4 12/14/2022   HCT 44.3 12/14/2022   MCV 93.3 12/14/2022   PLT 271 12/14/2022   Physical Exam:   VS:  BP 104/62 (BP Location: Left Arm, Patient Position: Sitting, Cuff Size: Normal)   Pulse 84   Resp 16   Ht 5\' 10"  (1.778 m)   Wt 168 lb 6.4 oz (76.4 kg)   SpO2 94%   BMI 24.16 kg/m    Wt Readings from Last 3 Encounters:  08/21/23 168 lb 6.4 oz (76.4 kg)  12/14/22 178 lb 3.2 oz (80.8 kg)  11/21/22 183 lb (83 kg)     Physical Exam Neck:     Vascular: No carotid bruit or JVD.  Cardiovascular:  Rate and Rhythm: Normal rate. Rhythm irregular.     Pulses: Normal pulses and intact distal pulses.     Heart sounds: No murmur heard. Pulmonary:     Effort: Pulmonary effort is normal.     Breath sounds: Normal breath sounds.  Abdominal:     General: Bowel sounds are normal.     Palpations: Abdomen is soft.  Musculoskeletal:     Right lower leg: No edema.     Left lower leg: No edema.  Skin:    Capillary Refill: Capillary refill takes less than 2 seconds.     Studies Reviewed: Marland Kitchen   EKG Interpretation Date/Time:  Tuesday August 21 2023 10:23:23 EDT Ventricular Rate:  70 PR Interval:    QRS Duration:  86 QT Interval:  396 QTC Calculation: 427 R Axis:   -45  Text Interpretation: EKG 08/31/2023: Atrial fibrillation with  controlled ventricular sponsor rate of 70 bpm, Left anterior fascicular block, poor R progression, cannot exclude anteroseptal infarct old.  Single PVC.  Nonspecific T abnormality.  Compared to 07/30/2018, A-fib is rate controlled now. Confirmed by Delrae Rend [phone]) on 08/21/2023 10:33:45 AM   EKG 08/17/2022: Atrial fibrillation with controlled ventricular sponsor rate of 78 bpm, frequent PVCs, left axis deviation, left anterior fascicular block.  Poor R wave progression, cannot exclude anteroseptal infarct old.  Nonspecific T abnormality.   Coronary angiogram 01/10/2013: Severe diffuse coronary calcification with scattered 20-30% stenosis. Normal LVEF.    Echocardiogram 08/07/2018: - Left ventricle: The cavity size was normal. Wall thickness was  increased in a pattern of mild LVH. Systolic function was mildly  reduced. The estimated ejection fraction was in the range of 45%   to 50%. Diffuse hypokinesis. The study is not technically sufficient to allow evaluation of LV diastolic function. - Mitral valve: Mildly thickened leaflets . There was mild  regurgitation. - Left atrium: Severely dilated. - Right ventricle: Moderately dilated and moderately hypokinetic. - Right atrium: The atrium was mildly dilated. - Tricuspid valve: There was mild regurgitation. - Pulmonary arteries: PA peak pressure: 40 mm Hg (S) + RAP. - Systemic veins: IVC is not visualized.  ASSESSMENT AND PLAN: .      ICD-10-CM   1. Coronary artery disease of native artery of native heart with stable angina pectoris (HCC)  I25.118 EKG 12-Lead    ECHOCARDIOGRAM COMPLETE    2. Permanent atrial fibrillation (HCC)  I48.21 apixaban (ELIQUIS) 5 MG TABS tablet    ECHOCARDIOGRAM COMPLETE    3. Primary hypertension  I10     4. Alcoholic cirrhosis of liver without ascites (HCC)  K70.30      CHA2DS2-VASc Score = 6 [CHF History: 0, HTN History: 1, Diabetes History: 0, Stroke History: 2, Vascular Disease History: 1, Age Score: 2,  Gender Score: 0].  Therefore, the patient's annual risk of stroke is 9.7 %.     Assessment and Plan    Atrial Fibrillation Long-standing history, no history of stroke. Discussed the risk of stroke and the benefits of anticoagulation now that the patient has been abstinent from alcohol for 5 years. -Start Eliquis 5mg  BID.  Chronic Stable Angina Exertional chest pain for 2-3 years, no change in symptoms. -Repeat echocardiogram to assess LVEF.  Alcohol Abstinence 5 years of abstinence after a history of alcohol-related hepatic encephalopathy and ascites. -Continue abstinence.  Nocturnal Leg Cramps Occurring in the evenings, no associated pain with walking.  Vascular examination is normal. -Try a spoonful of mustard at night for relief.  General Health  Maintenance -Stop taking Aspirin and Aleve due to risk of kidney damage and liver bleeding. -Consider taking a daily multivitamin. -Check CBC and CMP with Dr. Tanya Nones in January 2025. -Follow-up in 6 months.      Patient coronary disease has remained stable with mild symptoms of angina pectoris.  With regard to hypertension, blood pressure is well-controlled and also atrial fibrillation is rate controlled on metoprolol tartrate 50 mg twice daily along with spironolactone 50 mg daily, continue the same.  With regard to cirrhosis of liver with ascites, he has not had any further ascites and has remained abstinent from alcohol, he has not had any bleeding diathesis, external labs reviewed, hemoglobin is stable.  In view of markedly elevated CHA2DS2-VASc score, as patient has been abstinent from alcohol, his risk being extremely high for cardioembolic phenomena, we will start him on Eliquis.  Patient is aware not to use aspirin or NSAIDs to decrease the risk of bleeding.  Patient has been on Eliquis in the past but had discontinued this due to cost, however it has been 5 years since he last tried Eliquis hence hopefully insurance has changed  otherwise we can certainly obtain patient assistance.  In view of CAD, mildly reduced LVEF noted about 5 years ago, I will repeat echocardiogram.  Otherwise he is stable from cardiac standpoint, I will see him back in 6 months as I am starting him on anticoagulation, he is seeing Dr. Tanya Nones in January 2025, I will request him to perform CBC along with CMP at that time.  Signed,  Yates Decamp, MD, [facility] 08/21/2023, 10:54 AM

## 2023-08-21 ENCOUNTER — Encounter: Payer: Self-pay | Admitting: Cardiology

## 2023-08-21 ENCOUNTER — Telehealth: Payer: Self-pay | Admitting: Cardiology

## 2023-08-21 ENCOUNTER — Ambulatory Visit: Payer: Medicare Other | Attending: Cardiology | Admitting: Cardiology

## 2023-08-21 VITALS — BP 104/62 | HR 84 | Resp 16 | Ht 70.0 in | Wt 168.4 lb

## 2023-08-21 DIAGNOSIS — I4821 Permanent atrial fibrillation: Secondary | ICD-10-CM

## 2023-08-21 DIAGNOSIS — I2510 Atherosclerotic heart disease of native coronary artery without angina pectoris: Secondary | ICD-10-CM

## 2023-08-21 DIAGNOSIS — I10 Essential (primary) hypertension: Secondary | ICD-10-CM

## 2023-08-21 DIAGNOSIS — K7030 Alcoholic cirrhosis of liver without ascites: Secondary | ICD-10-CM

## 2023-08-21 DIAGNOSIS — I25118 Atherosclerotic heart disease of native coronary artery with other forms of angina pectoris: Secondary | ICD-10-CM | POA: Diagnosis not present

## 2023-08-21 DIAGNOSIS — G4762 Sleep related leg cramps: Secondary | ICD-10-CM

## 2023-08-21 MED ORDER — APIXABAN 5 MG PO TABS: 5.0000 mg | ORAL_TABLET | Freq: Two times a day (BID) | ORAL | 0 refills | Status: DC

## 2023-08-21 MED ORDER — METOPROLOL TARTRATE 50 MG PO TABS: 50.0000 mg | ORAL_TABLET | Freq: Two times a day (BID) | ORAL | 3 refills | Status: DC

## 2023-08-21 MED ORDER — APIXABAN 5 MG PO TABS: 5.0000 mg | ORAL_TABLET | Freq: Two times a day (BID) | ORAL | 11 refills | Status: DC

## 2023-08-21 NOTE — Patient Instructions (Addendum)
Medication Instructions:  Your physician has recommended you make the following change in your medication:  1-START Eliquis 5 mg by mouth twice daily.  *If you need a refill on your cardiac medications before your next appointment, please call your pharmacy*  Lab Work: If you have labs (blood work) drawn today and your tests are completely normal, you will receive your results only by: MyChart Message (if you have MyChart) OR A paper copy in the mail If you have any lab test that is abnormal or we need to change your treatment, we will call you to review the results.  Testing/Procedures: Your physician has requested that you have an echocardiogram. Echocardiography is a painless test that uses sound waves to create images of your heart. It provides your doctor with information about the size and shape of your heart and how well your heart's chambers and valves are working. This procedure takes approximately one hour. There are no restrictions for this procedure. Please do NOT wear cologne, perfume, aftershave, or lotions (deodorant is allowed). Please arrive 15 minutes prior to your appointment time.  Follow-Up: At [facility], you and your health needs are our priority.  As part of our continuing mission to provide you with exceptional heart care, we have created designated Provider Care Teams.  These Care Teams include your primary Cardiologist (physician) and Advanced Practice Providers (APPs -  Physician Assistants and Nurse Practitioners) who all work together to provide you with the care you need, when you need it.  We recommend signing up for the patient portal called "MyChart".  Sign up information is provided on this After Visit Summary.  MyChart is used to connect with patients for Virtual Visits (Telemedicine).  Patients are able to view lab/test results, encounter notes, upcoming appointments, etc.  Non-urgent messages can be sent to your provider as well.   To learn more  about what you can do with MyChart, go to [email or web address].    Your next appointment:   6 month  Provider:   Yates Decamp, MD

## 2023-08-21 NOTE — Telephone Encounter (Signed)
*  STAT* If patient is at the pharmacy, call can be transferred to refill team.   1. Which medications need to be refilled? (please list name of each medication and dose if known)   metoprolol tartrate (LOPRESSOR) 50 MG tablet    2. Which pharmacy/location (including street and city if local pharmacy) is medication to be sent to? [id number] [place], [place] - [address]    3. Do they need a 30 day or 90 day supply? 90 day

## 2023-08-21 NOTE — Telephone Encounter (Signed)
Pt's medication was sent to pt's pharmacy as requested. Confirmation received.  °

## 2023-09-16 ENCOUNTER — Other Ambulatory Visit: Payer: Self-pay | Admitting: Cardiology

## 2023-09-16 ENCOUNTER — Other Ambulatory Visit: Payer: Self-pay | Admitting: Family Medicine

## 2023-09-16 DIAGNOSIS — K7030 Alcoholic cirrhosis of liver without ascites: Secondary | ICD-10-CM

## 2023-09-17 DIAGNOSIS — Z23 Encounter for immunization: Secondary | ICD-10-CM | POA: Diagnosis not present

## 2023-09-18 ENCOUNTER — Other Ambulatory Visit: Payer: Self-pay

## 2023-09-18 ENCOUNTER — Telehealth: Payer: Self-pay | Admitting: Family Medicine

## 2023-09-18 ENCOUNTER — Other Ambulatory Visit: Payer: Self-pay | Admitting: Family Medicine

## 2023-09-18 DIAGNOSIS — R252 Cramp and spasm: Secondary | ICD-10-CM

## 2023-09-18 MED ORDER — GABAPENTIN 300 MG PO CAPS: 300.0000 mg | ORAL_CAPSULE | Freq: Every day | ORAL | 1 refills | Status: DC

## 2023-09-18 NOTE — Telephone Encounter (Signed)
Requested Prescriptions  Pending Prescriptions Disp Refills   allopurinol (ZYLOPRIM) 100 MG tablet [Pharmacy Med Name: ALLOPURINOL 100 MG TABLET] 180 tablet 0    Sig: TAKE 2 TABLETS BY MOUTH EVERY DAY     Endocrinology:  Gout Agents - allopurinol Failed - 09/16/2023 10:16 AM      Failed - Uric Acid in normal range and within 360 days    Uric Acid, Serum  Date Value Ref Range Status  02/27/2017 4.4 4.0 - 8.0 mg/dL Final         Failed - Valid encounter within last 12 months    Recent Outpatient Visits           2 years ago Pre-operative clearance   [facility] Donita Brooks, MD   3 years ago Essential hypertension   [facility] [place], Velna Hatchet, MD   3 years ago Routine general medical examination at a health care facility   [facility] [place], Velna Hatchet, MD   3 years ago Disorder of both eustachian tubes   [facility] [place], Velna Hatchet, MD   4 years ago Alcoholic cirrhosis of liver without ascites (HCC)   [facility] [place], Velna Hatchet, MD              Passed - Cr in normal range and within 360 days    Creat  Date Value Ref Range Status  12/14/2022 1.00 0.70 - 1.22 mg/dL Final         Passed - CBC within normal limits and completed in the last 12 months    WBC  Date Value Ref Range Status  12/14/2022 12.6 (H) 3.8 - 10.8 Thousand/uL Final   RBC  Date Value Ref Range Status  12/14/2022 4.75 4.20 - 5.80 Million/uL Final   Hemoglobin  Date Value Ref Range Status  12/14/2022 15.4 13.2 - 17.1 g/dL Final   HCT  Date Value Ref Range Status  12/14/2022 44.3 38.5 - 50.0 % Final   MCHC  Date Value Ref Range Status  12/14/2022 34.8 32.0 - 36.0 g/dL Final   [facility]  Date Value Ref Range Status  12/14/2022 32.4 27.0 - 33.0 pg Final   MCV  Date Value Ref Range Status  12/14/2022 93.3 80.0 - 100.0 fL Final   No results found for: "PLTCOUNTKUC", "LABPLAT", "POCPLA" RDW  Date Value  Ref Range Status  12/14/2022 12.8 11.0 - 15.0 % Final

## 2023-09-18 NOTE — Telephone Encounter (Signed)
Prescription Request  09/18/2023  LOV: 12/14/2022  What is the name of the medication or equipment?   gabapentin (NEURONTIN) 300 MG capsule [[id number]]  DISCONTINUED   Have you contacted your pharmacy to request a refill? Yes   Which pharmacy would you like this sent to?  [id number] [place], [place] - [address] [place] [address] Phone: [phone] Fax: [phone]    Patient notified that their request is being sent to the clinical staff for review and that they should receive a response within 2 business days.   Please advise patient at [phone].

## 2023-09-26 ENCOUNTER — Other Ambulatory Visit: Payer: Self-pay | Admitting: Cardiology

## 2023-09-26 DIAGNOSIS — I4821 Permanent atrial fibrillation: Secondary | ICD-10-CM

## 2023-09-26 MED ORDER — APIXABAN 5 MG PO TABS: 5.0000 mg | ORAL_TABLET | Freq: Two times a day (BID) | ORAL | 1 refills | Status: DC

## 2023-09-26 NOTE — Telephone Encounter (Signed)
Pt called in stating he does not want to go to the pharmacy every month for a refill, he would rather have 90 day supply sent for eliquis if possible.    apixaban (ELIQUIS) 5 MG TABS tablet  [id number] - [place], [place] - [address]

## 2023-09-26 NOTE — Telephone Encounter (Signed)
Prescription refill request for Eliquis received. Indication: AF Last office visit: 08/21/23  Jeanella Cara MD Scr: 1.00 on 12/14/22  Epic Age: 82 Weight: 76.4kg  Based on above findings Eliquis 5mg  twice daily is the appropriate dose.  Refill approved.

## 2023-09-26 NOTE — Telephone Encounter (Signed)
RX sent to [facility]mad[facility]

## 2023-09-27 DIAGNOSIS — Z961 Presence of intraocular lens: Secondary | ICD-10-CM | POA: Diagnosis not present

## 2023-09-27 DIAGNOSIS — H53453 Other localized visual field defect, bilateral: Secondary | ICD-10-CM | POA: Diagnosis not present

## 2023-10-19 DIAGNOSIS — H53453 Other localized visual field defect, bilateral: Secondary | ICD-10-CM | POA: Diagnosis not present

## 2023-11-01 ENCOUNTER — Encounter: Payer: Self-pay | Admitting: Family Medicine

## 2023-11-01 ENCOUNTER — Ambulatory Visit: Payer: Medicare Other | Admitting: Family Medicine

## 2023-11-01 VITALS — BP 102/74 | HR 71 | Temp 97.8°F | Ht 70.0 in | Wt 172.0 lb

## 2023-11-01 DIAGNOSIS — M79604 Pain in right leg: Secondary | ICD-10-CM | POA: Insufficient documentation

## 2023-11-01 NOTE — Progress Notes (Signed)
Subjective:  HPI: Samuel Flores is a 83 y.o. male presenting on 11/01/2023 for Acute Visit (RIGHT lower LEG PAIN started on the ankle and radiating upwards. )   HPI Patient is in today for right leg pain for 1 year that started in his ankle and would come and go with numbness. Was diagnosed with neuropathy and prescribed gabapentin. Has been taking this inconsistently and has tried OTC Tylenol. His pain is described as dull and radiates from his foot up the lateral aspect of his leg to his knee sometimes. This pain is worse later in the day with physical activity and relieved by rest. Denies swelling, redness, or warmth, no recent travel or surgery.   Review of Systems  All other systems reviewed and are negative.   Relevant past medical history reviewed and updated as indicated.   Past Medical History:  Diagnosis Date   Alcohol abuse, daily use 08/18/2013   Alcoholic cirrhosis of liver with ascites (HCC) 08/01/2018   Arthritis    osteoarthritis   CAD (coronary artery disease)    Non obstructive   Cancer (HCC) 1994   prostate   CHF (congestive heart failure) (HCC)    Diastolic dysfunction Echo 2012   Grade 1   Essential hypertension 07/15/2013   Gout    Hx of subdural hematoma 03/2011   Hx-TIA (transient ischemic attack) 2011   Hypercholesteremia    Hypertension    Stroke [facility])    ? mini stroke      Past Surgical History:  Procedure Laterality Date   BURR HOLE FOR SUBDURAL HEMATOMA  03/2011   CARDIOVERSION N/A 02/20/2017   Procedure: CARDIOVERSION;  Surgeon: Yates Decamp, MD;  Location: [facility] ENDOSCOPY;  Service: Cardiovascular;  Laterality: N/A;   CHOLECYSTECTOMY N/A 08/08/2018   Procedure: LAPAROSCOPIC CHOLECYSTECTOMY;  Surgeon: Almond Lint, MD;  Location: [facility] ORS;  Service: General;  Laterality: N/A;   EYE SURGERY Left 03/2009   cataract   LEFT HEART CATHETERIZATION WITH CORONARY ANGIOGRAM N/A 01/02/2013   Procedure: LEFT HEART CATHETERIZATION WITH CORONARY ANGIOGRAM;   Surgeon: Pamella Pert, MD;  Location: [facility] CATH LAB;  Service: Cardiovascular;  Laterality: N/A;   NASAL FLAP ROTATION Right 04/18/2021   Procedure: Reconstruction of right nasal Mohs defect with flaps and grafts as necessary;  Surgeon: Allena Napoleon, MD;  Location: [facility] OR;  Service: Plastics;  Laterality: Right;   PROSTATE SURGERY  1994   SKIN SURGERY     SPINE SURGERY     cervical    Allergies and medications reviewed and updated.   Current Outpatient Medications:    allopurinol (ZYLOPRIM) 100 MG tablet, TAKE 2 TABLETS BY MOUTH EVERY DAY, Disp: 180 tablet, Rfl: 0   apixaban (ELIQUIS) 5 MG TABS tablet, Take 1 tablet (5 mg total) by mouth 2 (two) times daily., Disp: 56 tablet, Rfl: 0   diltiazem (CARDIZEM CD) 180 MG 24 hr capsule, TAKE 1 CAPSULE BY MOUTH EVERY DAY, Disp: 90 capsule, Rfl: 1   folic acid (FOLVITE) 800 MCG tablet, Take 800 mcg by mouth daily., Disp: , Rfl:    metoprolol tartrate (LOPRESSOR) 50 MG tablet, Take 1 tablet (50 mg total) by mouth 2 (two) times daily., Disp: 90 tablet, Rfl: 3   spironolactone (ALDACTONE) 50 MG tablet, TAKE 1 TABLET BY MOUTH EVERY DAY, Disp: 90 tablet, Rfl: 3   gabapentin (NEURONTIN) 300 MG capsule, Take 1 capsule (300 mg total) by mouth at bedtime. (Patient not taking: Reported on 11/01/2023), Disp: 90 capsule, Rfl: 1  No Known Allergies  Objective:   BP 102/74   Pulse 71   Temp 97.8 F (36.6 C) (Oral)   Ht 5\' 10"  (1.778 m)   Wt 172 lb (78 kg)   SpO2 97%   BMI 24.68 kg/m      11/01/2023   11:37 AM 08/21/2023   10:19 AM 12/14/2022   11:08 AM  Vitals with BMI  Height 5\' 10"  5\' 10"  5\' 10"   Weight 172 lbs 168 lbs 6 oz 178 lbs 3 oz  BMI 24.68 24.16 25.57  Systolic 102 104 865  Diastolic 74 62 62  Pulse 71 84 60     Physical Exam Vitals and nursing note reviewed.  Constitutional:      Appearance: Normal appearance. He is normal weight.  HENT:     Head: Normocephalic and atraumatic.  Musculoskeletal:     Right lower leg: Normal.      Left lower leg: Normal.     Right ankle: Normal.     Left ankle: Normal.     Right foot: Normal.     Left foot: Normal.  Skin:    General: Skin is warm and dry.     Capillary Refill: Capillary refill takes less than 2 seconds.  Neurological:     General: No focal deficit present.     Mental Status: He is alert and oriented to person, place, and time. Mental status is at baseline.  Psychiatric:        Mood and Affect: Mood normal.        Behavior: Behavior normal.        Thought Content: Thought content normal.        Judgment: Judgment normal.     Assessment & Plan:  Right leg pain Assessment & Plan: No red flags on exam. Encouraged to consistently take Gabapentin, may increase to 600mg  nightly and 300mg  daily. If not improved or worse return to office for further workup. Seek medical care for redness, warmth, or swelling.      Follow up plan: Return if symptoms worsen or fail to improve.  Park Meo, FNP

## 2023-11-01 NOTE — Assessment & Plan Note (Signed)
No red flags on exam. Encouraged to consistently take Gabapentin, may increase to 600mg  nightly and 300mg  daily. If not improved or worse return to office for further workup. Seek medical care for redness, warmth, or swelling.

## 2023-11-09 ENCOUNTER — Other Ambulatory Visit: Payer: Self-pay

## 2023-11-09 ENCOUNTER — Other Ambulatory Visit: Payer: Self-pay | Admitting: Family Medicine

## 2023-11-09 ENCOUNTER — Telehealth: Payer: Self-pay

## 2023-11-09 MED ORDER — GABAPENTIN 300 MG PO CAPS: 300.0000 mg | ORAL_CAPSULE | Freq: Two times a day (BID) | ORAL | 3 refills | Status: DC

## 2023-11-09 MED ORDER — GABAPENTIN 300 MG PO CAPS: ORAL_CAPSULE | ORAL | 3 refills | Status: DC

## 2023-11-09 NOTE — Telephone Encounter (Signed)
Copied from CRM [id number]. Topic: Clinical - Prescription Issue >> Nov 09, 2023 12:02 PM Carlatta H wrote: Reason for CRM: gabapentin (NEURONTIN) 300 MG capsule [[id number]] patient called and stated he has been taking 2 pill a day not 1 per the prescription//Pharmacy will not refill//Please call patient because he is almost out of medication

## 2023-11-19 ENCOUNTER — Encounter: Payer: Self-pay | Admitting: Family Medicine

## 2023-11-19 ENCOUNTER — Ambulatory Visit (INDEPENDENT_AMBULATORY_CARE_PROVIDER_SITE_OTHER): Payer: Medicare Other | Admitting: Family Medicine

## 2023-11-19 VITALS — BP 126/84 | HR 77 | Temp 98.5°F | Ht 70.0 in | Wt 173.0 lb

## 2023-11-19 DIAGNOSIS — K7030 Alcoholic cirrhosis of liver without ascites: Secondary | ICD-10-CM

## 2023-11-19 DIAGNOSIS — Z23 Encounter for immunization: Secondary | ICD-10-CM | POA: Diagnosis not present

## 2023-11-19 DIAGNOSIS — I5030 Unspecified diastolic (congestive) heart failure: Secondary | ICD-10-CM | POA: Diagnosis not present

## 2023-11-19 DIAGNOSIS — M79604 Pain in right leg: Secondary | ICD-10-CM | POA: Diagnosis not present

## 2023-11-19 DIAGNOSIS — I4821 Permanent atrial fibrillation: Secondary | ICD-10-CM | POA: Diagnosis not present

## 2023-11-19 NOTE — Addendum Note (Signed)
 Addended by: Venia Carbon K on: 11/19/2023 10:41 AM   Modules accepted: Orders

## 2023-11-19 NOTE — Progress Notes (Signed)
 Subjective:    Patient ID: Samuel Flores, male    DOB: [birth year], 83 y.o.   MRN: [id number]  HPI Patient is an 83 year old Caucasian male here today for complete physical exam.  Past medical history is significant for alcoholic cirrhosis of the liver.  He also has a history of atrial fibrillation.  He also has a history of mildly suppressed ejection fraction congestive heart failure with an EF of 45 to 50% on an echocardiogram in 2019 along with diastolic dysfunction. Patient is currently not on anticoagulation due to fall risk based on previous office visits.  He also wants to minimize medication that he is taking.  Patient presents complaining of right leg pain.  He saw my partner last week.  She started him on gabapentin .  The pain is located in his right anterior shin midway down.  He states the pain will double him over.  He has a negative [place]' sign today.  There is no peripheral edema.  He has palpable dorsalis pedis and posterior tibialis pulses bilaterally.  There is no erythema or swelling.  He does have some tenderness over the shaft of the anterior tibia.  He denies any falls or injuries.  He also complains of pain in his knee.  He has had a history of neuropathy.  The gabapentin  has done nothing to help his pain.  He states the pain is worse with weightbearing.  He denies any chest pain shortness of breath or dyspnea on exertion.  Cardiology recently started him on Eliquis .  He denies any falls.  He does have diastolic heart failure and is currently on spironolactone .  He does not want to add an SGLT2 medication at the present time.  He had his flu shot and his COVID-vaccine.  He is due for Capvaxive. Past Medical History:  Diagnosis Date   Alcohol abuse, daily use 08/18/2013   Alcoholic cirrhosis of liver with ascites (HCC) 08/01/2018   Arthritis    osteoarthritis   CAD (coronary artery disease)    Non obstructive   Cancer (HCC) 1994   prostate   CHF (congestive heart failure)  (HCC)    Diastolic dysfunction Echo 2012   Grade 1   Essential hypertension 07/15/2013   Gout    Hx of subdural hematoma 03/2011   Hx-TIA (transient ischemic attack) 2011   Hypercholesteremia    Hypertension    Stroke [facility])    ? mini stroke    Past Surgical History:  Procedure Laterality Date   BURR HOLE FOR SUBDURAL HEMATOMA  03/2011   CARDIOVERSION N/A 02/20/2017   Procedure: CARDIOVERSION;  Surgeon: Gordy Bergamo, MD;  Location: [facility] ENDOSCOPY;  Service: Cardiovascular;  Laterality: N/A;   CHOLECYSTECTOMY N/A 08/08/2018   Procedure: LAPAROSCOPIC CHOLECYSTECTOMY;  Surgeon: Aron Shoulders, MD;  Location: [facility] ORS;  Service: General;  Laterality: N/A;   EYE SURGERY Left 03/2009   cataract   LEFT HEART CATHETERIZATION WITH CORONARY ANGIOGRAM N/A 01/02/2013   Procedure: LEFT HEART CATHETERIZATION WITH CORONARY ANGIOGRAM;  Surgeon: Erick JONELLE Bergamo, MD;  Location[facility] CATH LAB;  Service: Cardiovascular;  Laterality: N/A;   NASAL FLAP ROTATION Right 04/18/2021   Procedure: Reconstruction of right nasal Mohs defect with flaps and grafts as necessary;  Surgeon: Elisabeth Craig RAMAN, MD;  Location: [facility] OR;  Service: Plastics;  Laterality: Right;   PROSTATE SURGERY  1994   SKIN SURGERY     SPINE SURGERY     cervical   Current Outpatient Medications on File Prior to Visit  Medication Sig Dispense Refill   allopurinol  (ZYLOPRIM ) 100 MG tablet TAKE 2 TABLETS BY MOUTH EVERY DAY 180 tablet 0   apixaban  (ELIQUIS ) 5 MG TABS tablet Take 1 tablet (5 mg total) by mouth 2 (two) times daily. 56 tablet 0   diltiazem  (CARDIZEM  CD) 180 MG 24 hr capsule TAKE 1 CAPSULE BY MOUTH EVERY DAY 90 capsule 1   folic acid  (FOLVITE ) 800 MCG tablet Take 800 mcg by mouth daily.     gabapentin  (NEURONTIN ) 300 MG capsule Take 1 capsule (300 mg total) by mouth 2 (two) times daily. Take 1 tablet (300mg ) daily and can increase to 600mg  as needed 270 capsule 3   metoprolol  tartrate (LOPRESSOR ) 50 MG tablet Take 1 tablet (50 mg total) by mouth 2  (two) times daily. 90 tablet 3   spironolactone  (ALDACTONE ) 50 MG tablet TAKE 1 TABLET BY MOUTH EVERY DAY 90 tablet 3   No current facility-administered medications on file prior to visit.   No Known Allergies Social History   Socioeconomic History   Marital status: Married    Spouse name: Not on file   Number of children: 2   Years of education: Not on file   Highest education level: Not on file  Occupational History   Occupation: Previous work at golf course    Employer: RETIRED  Tobacco Use   Smoking status: Former    Types: Pipe    Quit date: 04/09/2013    Years since quitting: 10.6   Smokeless tobacco: Never  Vaping Use   Vaping status: Never Used  Substance and Sexual Activity   Alcohol use: Not Currently    Comment: Pt has not had alcohol in 15 months    Drug use: No   Sexual activity: Never  Other Topics Concern   Not on file  Social History Narrative   Not on file   Social Drivers of Health   Financial Resource Strain: Low Risk  (11/21/2022)   Overall Financial Resource Strain (CARDIA)    Difficulty of Paying Living Expenses: Not hard at all  Food Insecurity: No Food Insecurity (11/21/2022)   Hunger Vital Sign    Worried About Running Out of Food in the Last Year: Never true    Ran Out of Food in the Last Year: Never true  Transportation Needs: No Transportation Needs (11/21/2022)   PRAPARE - Administrator, Civil Service (Medical): No    Lack of Transportation (Non-Medical): No  Physical Activity: Inactive (11/21/2022)   Exercise Vital Sign    Days of Exercise per Week: 0 days    Minutes of Exercise per Session: 0 min  Stress: No Stress Concern Present (11/21/2022)   [employer] of Occupational Health - Occupational Stress Questionnaire    Feeling of Stress : Not at all  Social Connections: Moderately Integrated (11/21/2022)   Social Connection and Isolation Panel [NHANES]    Frequency of Communication with Friends and Family: More than three  times a week    Frequency of Social Gatherings with Friends and Family: Three times a week    Attends Religious Services: More than 4 times per year    Active Member of Clubs or Organizations: No    Attends Banker Meetings: Never    Marital Status: Married  Catering Manager Violence: Not At Risk (11/21/2022)   Humiliation, Afraid, Rape, and Kick questionnaire    Fear of Current or Ex-Partner: No    Emotionally Abused: No    Physically Abused: No  Sexually Abused: No      Review of Systems  All other systems reviewed and are negative.      Objective:   Physical Exam Vitals reviewed.  Constitutional:      General: He is not in acute distress.    Appearance: He is obese. He is not ill-appearing or toxic-appearing.  HENT:     Head: Normocephalic and atraumatic.  Cardiovascular:     Rate and Rhythm: Normal rate. Rhythm irregular.     Heart sounds: Normal heart sounds. No murmur heard.    No friction rub. No gallop.  Pulmonary:     Effort: Pulmonary effort is normal. No respiratory distress.     Breath sounds: Normal breath sounds. No stridor. No wheezing or rhonchi.  Abdominal:     General: Bowel sounds are normal. There is no distension.     Palpations: Abdomen is soft.     Tenderness: There is no abdominal tenderness.  Musculoskeletal:     Right lower leg: No edema.     Left lower leg: No edema.  Skin:    Findings: No erythema or rash.  Neurological:     General: No focal deficit present.     Mental Status: He is alert and oriented to person, place, and time.     Cranial Nerves: No cranial nerve deficit.     Motor: No weakness.  Psychiatric:        Mood and Affect: Mood normal.        Behavior: Behavior normal.        Thought Content: Thought content normal.           Assessment & Plan:  CHF with left ventricular diastolic dysfunction, NYHA class 2 (HCC) - Plan: COMPLETE METABOLIC PANEL WITH GFR, CBC with Differential/Platelet  Permanent  atrial fibrillation (HCC) - Plan: COMPLETE METABOLIC PANEL WITH GFR, CBC with Differential/Platelet  Alcoholic cirrhosis of liver without ascites (HCC)  Leg pain, anterior, right - Plan: DG Tibia/Fibula Right Anterior right leg pain does not follow any particular diagnosis.  At the present time there is no evidence of any DVT or peripheral artery disease.  The patient is having pain in his knees and perhaps is having referred pain from arthritis in the knee however the pain is out of proportion over the anterior right shin.  I do not palpate any abnormalities in that area.  Therefore I will start by getting an x-ray of the tibia and fibula to rule out any bone base neoplasm.  If the x-ray is completely normal, I would recommend the patient return for cortisone injection in the right knee to see if the pain improves as we treat the arthritis in his knee.  Blood pressure today is excellent.  Check CBC and CMP.  Patient received the new pneumonia vaccine today in the clinic.

## 2023-11-20 ENCOUNTER — Ambulatory Visit
Admission: RE | Admit: 2023-11-20 | Discharge: 2023-11-20 | Disposition: A | Discharge status: Home / Self Care | Payer: Medicare Other | Source: Ambulatory Visit | Attending: Family Medicine | Admitting: Family Medicine

## 2023-11-20 DIAGNOSIS — M79661 Pain in right lower leg: Secondary | ICD-10-CM | POA: Diagnosis not present

## 2023-11-20 DIAGNOSIS — M79604 Pain in right leg: Secondary | ICD-10-CM

## 2023-11-20 LAB — CBC WITH DIFFERENTIAL/PLATELET
Absolute Lymphocytes: 1740 {cells}/uL (ref 850–3900)
Absolute Monocytes: 814 {cells}/uL (ref 200–950)
Basophils Absolute: 68 {cells}/uL (ref 0–200)
Basophils Relative: 0.6 %
Eosinophils Absolute: 226 {cells}/uL (ref 15–500)
Eosinophils Relative: 2 %
HCT: 46.9 % (ref 38.5–50.0)
Hemoglobin: 15.7 g/dL (ref 13.2–17.1)
MCH: 31.8 pg (ref 27.0–33.0)
MCHC: 33.5 g/dL (ref 32.0–36.0)
MCV: 95.1 fL (ref 80.0–100.0)
MPV: 9.5 fL (ref 7.5–12.5)
Monocytes Relative: 7.2 %
Neutro Abs: 8452 {cells}/uL — ABNORMAL HIGH (ref 1500–7800)
Neutrophils Relative %: 74.8 %
Platelets: 280 10*3/uL (ref 140–400)
RBC: 4.93 10*6/uL (ref 4.20–5.80)
RDW: 13 % (ref 11.0–15.0)
Total Lymphocyte: 15.4 %
WBC: 11.3 10*3/uL — ABNORMAL HIGH (ref 3.8–10.8)

## 2023-11-20 LAB — COMPLETE METABOLIC PANEL WITH GFR
AG Ratio: 1.6 (calc) (ref 1.0–2.5)
ALT: 11 U/L (ref 9–46)
AST: 17 U/L (ref 10–35)
Albumin: 4.3 g/dL (ref 3.6–5.1)
Alkaline phosphatase (APISO): 115 U/L (ref 35–144)
BUN: 20 mg/dL (ref 7–25)
CO2: 26 mmol/L (ref 20–32)
Calcium: 9.5 mg/dL (ref 8.6–10.3)
Chloride: 101 mmol/L (ref 98–110)
Creat: 0.95 mg/dL (ref 0.70–1.22)
Globulin: 2.7 g/dL (ref 1.9–3.7)
Glucose, Bld: 106 mg/dL — ABNORMAL HIGH (ref 65–99)
Potassium: 4.6 mmol/L (ref 3.5–5.3)
Sodium: 140 mmol/L (ref 135–146)
Total Bilirubin: 0.9 mg/dL (ref 0.2–1.2)
Total Protein: 7 g/dL (ref 6.1–8.1)
eGFR: 79 mL/min/{1.73_m2} (ref >=60)

## 2023-11-27 ENCOUNTER — Ambulatory Visit (INDEPENDENT_AMBULATORY_CARE_PROVIDER_SITE_OTHER): Payer: Medicare Other | Admitting: Family Medicine

## 2023-11-27 ENCOUNTER — Encounter: Payer: Self-pay | Admitting: Family Medicine

## 2023-11-27 VITALS — BP 132/76 | HR 63 | Temp 97.6°F | Ht 70.0 in | Wt 173.0 lb

## 2023-11-27 DIAGNOSIS — M79604 Pain in right leg: Secondary | ICD-10-CM | POA: Diagnosis not present

## 2023-11-27 MED ORDER — TRIAMCINOLONE ACETONIDE 40 MG/ML IJ SUSP
80.0000 mg | Freq: Once | INTRAMUSCULAR | Status: AC
  Administered 2023-11-27: 80 mg via INTRA_ARTICULAR

## 2023-11-27 NOTE — Addendum Note (Signed)
 Addended by: Venia Carbon K on: 11/27/2023 05:03 PM   Modules accepted: Orders

## 2023-11-27 NOTE — Progress Notes (Signed)
 Subjective:    Patient ID: Samuel Flores, male    DOB: 1941/08/05, 83 y.o.   MRN: [id number]  HPI Patient continues to have severe pain in his right leg distal to his right knee.  The pain tends to be along the anterior lateral right shin.  My partner recently increase his gabapentin  to 600 mg twice a day but that did nothing to help his pain.  I obtain an x-ray of the right tibia and fibula to rule out any malignancies or abnormal lesions on the bone.  The x-ray was unremarkable.  The patient states that there is no exacerbating factor.  There is no alleviating factor.  The pain aches and throbs for no reason.  It tends to be worse at night.  Walking does not make the pain worse.  Movement does not make the pain better.  The patient does have some pain around his knee. Past Medical History:  Diagnosis Date   Alcohol abuse, daily use 08/18/2013   Alcoholic cirrhosis of liver with ascites (HCC) 08/01/2018   Arthritis    osteoarthritis   CAD (coronary artery disease)    Non obstructive   Cancer (HCC) 1994   prostate   CHF (congestive heart failure) (HCC)    Diastolic dysfunction Echo 2012   Grade 1   Essential hypertension 07/15/2013   Gout    Hx of subdural hematoma 03/2011   Hx-TIA (transient ischemic attack) 2011   Hypercholesteremia    Hypertension    Stroke [facility])    ? mini stroke    Past Surgical History:  Procedure Laterality Date   BURR HOLE FOR SUBDURAL HEMATOMA  03/2011   CARDIOVERSION N/A 02/20/2017   Procedure: CARDIOVERSION;  Surgeon: Gordy Bergamo, MD;  Location: [facility] ENDOSCOPY;  Service: Cardiovascular;  Laterality: N/A;   CHOLECYSTECTOMY N/A 08/08/2018   Procedure: LAPAROSCOPIC CHOLECYSTECTOMY;  Surgeon: Aron Shoulders, MD;  Location: [facility] ORS;  Service: General;  Laterality: N/A;   EYE SURGERY Left 03/2009   cataract   LEFT HEART CATHETERIZATION WITH CORONARY ANGIOGRAM N/A 01/02/2013   Procedure: LEFT HEART CATHETERIZATION WITH CORONARY ANGIOGRAM;  Surgeon: Erick JONELLE Bergamo,  MD;  Location: [facility] CATH LAB;  Service: Cardiovascular;  Laterality: N/A;   NASAL FLAP ROTATION Right 04/18/2021   Procedure: Reconstruction of right nasal Mohs defect with flaps and grafts as necessary;  Surgeon: Elisabeth Craig RAMAN, MD;  Location: [facility] OR;  Service: Plastics;  Laterality: Right;   PROSTATE SURGERY  1994   SKIN SURGERY     SPINE SURGERY     cervical   Current Outpatient Medications on File Prior to Visit  Medication Sig Dispense Refill   allopurinol  (ZYLOPRIM ) 100 MG tablet TAKE 2 TABLETS BY MOUTH EVERY DAY 180 tablet 0   apixaban  (ELIQUIS ) 5 MG TABS tablet Take 1 tablet (5 mg total) by mouth 2 (two) times daily. 56 tablet 0   diltiazem  (CARDIZEM  CD) 180 MG 24 hr capsule TAKE 1 CAPSULE BY MOUTH EVERY DAY 90 capsule 1   folic acid  (FOLVITE ) 800 MCG tablet Take 800 mcg by mouth daily.     gabapentin  (NEURONTIN ) 300 MG capsule Take 1 capsule (300 mg total) by mouth 2 (two) times daily. Take 1 tablet (300mg ) daily and can increase to 600mg  as needed 270 capsule 3   metoprolol  tartrate (LOPRESSOR ) 50 MG tablet Take 1 tablet (50 mg total) by mouth 2 (two) times daily. 90 tablet 3   spironolactone  (ALDACTONE ) 50 MG tablet TAKE 1 TABLET BY MOUTH EVERY  DAY 90 tablet 3   No current facility-administered medications on file prior to visit.   No Known Allergies Social History   Socioeconomic History   Marital status: Married    Spouse name: Not on file   Number of children: 2   Years of education: Not on file   Highest education level: Not on file  Occupational History   Occupation: Previous work at golf course    Employer: RETIRED  Tobacco Use   Smoking status: Former    Types: Pipe    Quit date: 04/09/2013    Years since quitting: 10.6   Smokeless tobacco: Never  Vaping Use   Vaping status: Never Used  Substance and Sexual Activity   Alcohol use: Not Currently    Comment: Pt has not had alcohol in 15 months    Drug use: No   Sexual activity: Never  Other Topics Concern    Not on file  Social History Narrative   Not on file   Social Drivers of Health   Financial Resource Strain: Low Risk  (11/21/2022)   Overall Financial Resource Strain (CARDIA)    Difficulty of Paying Living Expenses: Not hard at all  Food Insecurity: No Food Insecurity (11/21/2022)   Hunger Vital Sign    Worried About Running Out of Food in the Last Year: Never true    Ran Out of Food in the Last Year: Never true  Transportation Needs: No Transportation Needs (11/21/2022)   PRAPARE - Administrator, Civil Service (Medical): No    Lack of Transportation (Non-Medical): No  Physical Activity: Inactive (11/21/2022)   Exercise Vital Sign    Days of Exercise per Week: 0 days    Minutes of Exercise per Session: 0 min  Stress: No Stress Concern Present (11/21/2022)   [employer] of Occupational Health - Occupational Stress Questionnaire    Feeling of Stress : Not at all  Social Connections: Moderately Integrated (11/21/2022)   Social Connection and Isolation Panel [NHANES]    Frequency of Communication with Friends and Family: More than three times a week    Frequency of Social Gatherings with Friends and Family: Three times a week    Attends Religious Services: More than 4 times per year    Active Member of Clubs or Organizations: No    Attends Banker Meetings: Never    Marital Status: Married  Catering Manager Violence: Not At Risk (11/21/2022)   Humiliation, Afraid, Rape, and Kick questionnaire    Fear of Current or Ex-Partner: No    Emotionally Abused: No    Physically Abused: No    Sexually Abused: No      Review of Systems  All other systems reviewed and are negative.      Objective:   Physical Exam Vitals reviewed.  Constitutional:      General: He is not in acute distress.    Appearance: He is obese. He is not ill-appearing or toxic-appearing.  HENT:     Head: Normocephalic and atraumatic.  Cardiovascular:     Rate and Rhythm: Normal rate.  Rhythm irregular.     Heart sounds: Normal heart sounds. No murmur heard.    No friction rub. No gallop.  Pulmonary:     Effort: Pulmonary effort is normal. No respiratory distress.     Breath sounds: Normal breath sounds. No stridor. No wheezing or rhonchi.  Abdominal:     General: Bowel sounds are normal. There is no distension.  Palpations: Abdomen is soft.     Tenderness: There is no abdominal tenderness.  Musculoskeletal:     Right lower leg: No edema.     Left lower leg: No edema.  Skin:    Findings: No erythema or rash.  Neurological:     General: No focal deficit present.     Mental Status: He is alert and oriented to person, place, and time.     Cranial Nerves: No cranial nerve deficit.     Motor: No weakness.  Psychiatric:        Mood and Affect: Mood normal.        Behavior: Behavior normal.        Thought Content: Thought content normal.     Patient has 2/4 dorsalis pedis and posterior tibialis pulses bilaterally.  Skin is warm and intact.  There is no evidence of cellulitis.  There is no peripheral edema.  Patient has full range of motion in the knee and in the ankle.      Assessment & Plan:  Right leg pain  Anterior right leg pain does not follow any particular diagnosis.  At the present time there is no evidence of any DVT or peripheral artery disease.  The patient is having pain in his knees and perhaps is having referred pain from arthritis in the knee however the pain is out of proportion over the anterior right shin.  Given the negative x-ray, I recommend trying a cortisone injection in his right knee.  Using sterile technique, I injected the right knee with 2 cc of lidocaine , 2 cc of Marcaine , and 2 cc of 40 mg/mL Kenalog .  The patient tolerated the procedure well.  If the pain is not improving, the next step would be an MRI of the right lower leg versus additional medications for neuropathic pain

## 2023-11-28 ENCOUNTER — Other Ambulatory Visit (HOSPITAL_COMMUNITY): Payer: Medicare Other

## 2023-12-15 ENCOUNTER — Other Ambulatory Visit: Payer: Self-pay | Admitting: Cardiology

## 2023-12-17 ENCOUNTER — Telehealth: Payer: Self-pay | Admitting: Family Medicine

## 2023-12-17 ENCOUNTER — Other Ambulatory Visit: Payer: Self-pay

## 2023-12-17 DIAGNOSIS — M79604 Pain in right leg: Secondary | ICD-10-CM

## 2023-12-17 DIAGNOSIS — M1A09X0 Idiopathic chronic gout, multiple sites, without tophus (tophi): Secondary | ICD-10-CM

## 2023-12-17 DIAGNOSIS — H53453 Other localized visual field defect, bilateral: Secondary | ICD-10-CM | POA: Diagnosis not present

## 2023-12-17 MED ORDER — ALLOPURINOL 100 MG PO TABS: 200.0000 mg | ORAL_TABLET | Freq: Every day | ORAL | 1 refills | Status: DC

## 2023-12-17 NOTE — Telephone Encounter (Signed)
Prescription Request  12/17/2023  LOV: 11/27/2023  What is the name of the medication or equipment?   allopurinol (ZYLOPRIM) 100 MG tablet/Sig: take 2 tablets by mouth every day  **original script was for 90 days*  Have you contacted your pharmacy to request a refill? Yes   Which pharmacy would you like this sent to?  [id number] [place], [place] - [address] [place] [address] Phone: [phone] Fax: [phone]    Patient notified that their request is being sent to the clinical staff for review and that they should receive a response within 2 business days.   Please advise pharmacist.

## 2024-01-10 ENCOUNTER — Ambulatory Visit: Payer: Medicare Other | Admitting: *Deleted

## 2024-01-10 VITALS — Wt 166.6 lb

## 2024-01-10 DIAGNOSIS — Z0000 Encounter for general adult medical examination without abnormal findings: Secondary | ICD-10-CM

## 2024-01-10 NOTE — Patient Instructions (Signed)
 Samuel Flores , Thank you for taking time to come for your Medicare Wellness Visit. I appreciate your ongoing commitment to your health goals. Please review the following plan we discussed and let me know if I can assist you in the future.   Screening recommendations/referrals: Colonoscopy: no longer required Recommended yearly ophthalmology/optometry visit for glaucoma screening and checkup Recommended yearly dental visit for hygiene and checkup  Vaccinations: Influenza vaccine: up to date Pneumococcal vaccine: up to date Tdap vaccine: Education provided Shingles vaccine: up to date    Preventive Care 65 Years and Older, Male Preventive care refers to lifestyle choices and visits with your health care provider that can promote health and wellness. What does preventive care include? A yearly physical exam. This is also called an annual well check. Dental exams once or twice a year. Routine eye exams. Ask your health care provider how often you should have your eyes checked. Personal lifestyle choices, including: Daily care of your teeth and gums. Regular physical activity. Eating a healthy diet. Avoiding tobacco and drug use. Limiting alcohol use. Practicing safe sex. Taking low doses of aspirin every day. Taking vitamin and mineral supplements as recommended by your health care provider. What happens during an annual well check? The services and screenings done by your health care provider during your annual well check will depend on your age, overall health, lifestyle risk factors, and family history of disease. Counseling  Your health care provider may ask you questions about your: Alcohol use. Tobacco use. Drug use. Emotional well-being. Home and relationship well-being. Sexual activity. Eating habits. History of falls. Memory and ability to understand (cognition). Work and work Astronomer. Screening  You may have the following tests or measurements: Height, weight,  and BMI. Blood pressure. Lipid and cholesterol levels. These may be checked every 5 years, or more frequently if you are over 57 years old. Skin check. Lung cancer screening. You may have this screening every year starting at age 25 if you have a 30-pack-year history of smoking and currently smoke or have quit within the past 15 years. Fecal occult blood test (FOBT) of the stool. You may have this test every year starting at age 56. Flexible sigmoidoscopy or colonoscopy. You may have a sigmoidoscopy every 5 years or a colonoscopy every 10 years starting at age 47. Prostate cancer screening. Recommendations will vary depending on your family history and other risks. Hepatitis C blood test. Hepatitis B blood test. Sexually transmitted disease (STD) testing. Diabetes screening. This is done by checking your blood sugar (glucose) after you have not eaten for a while (fasting). You may have this done every 1-3 years. Abdominal aortic aneurysm (AAA) screening. You may need this if you are a current or former smoker. Osteoporosis. You may be screened starting at age 65 if you are at high risk. Talk with your health care provider about your test results, treatment options, and if necessary, the need for more tests. Vaccines  Your health care provider may recommend certain vaccines, such as: Influenza vaccine. This is recommended every year. Tetanus, diphtheria, and acellular pertussis (Tdap, Td) vaccine. You may need a Td booster every 10 years. Zoster vaccine. You may need this after age 91. Pneumococcal 13-valent conjugate (PCV13) vaccine. One dose is recommended after age 65. Pneumococcal polysaccharide (PPSV23) vaccine. One dose is recommended after age 57. Talk to your health care provider about which screenings and vaccines you need and how often you need them. This information is not intended to replace advice  given to you by your health care provider. Make sure you discuss any questions you  have with your health care provider. Document Released: 11/26/2015 Document Revised: 07/19/2016 Document Reviewed: 08/31/2015 Elsevier Interactive Patient Education  2017 [employer].  Fall Prevention in the Home Falls can cause injuries. They can happen to people of all ages. There are many things you can do to make your home safe and to help prevent falls. What can I do on the outside of my home? Regularly fix the edges of walkways and driveways and fix any cracks. Remove anything that might make you trip as you walk through a door, such as a raised step or threshold. Trim any bushes or trees on the path to your home. Use bright outdoor lighting. Clear any walking paths of anything that might make someone trip, such as rocks or tools. Regularly check to see if handrails are loose or broken. Make sure that both sides of any steps have handrails. Any raised decks and porches should have guardrails on the edges. Have any leaves, snow, or ice cleared regularly. Use sand or salt on walking paths during winter. Clean up any spills in your garage right away. This includes oil or grease spills. What can I do in the bathroom? Use night lights. Install grab bars by the toilet and in the tub and shower. Do not use towel bars as grab bars. Use non-skid mats or decals in the tub or shower. If you need to sit down in the shower, use a plastic, non-slip stool. Keep the floor dry. Clean up any water that spills on the floor as soon as it happens. Remove soap buildup in the tub or shower regularly. Attach bath mats securely with double-sided non-slip rug tape. Do not have throw rugs and other things on the floor that can make you trip. What can I do in the bedroom? Use night lights. Make sure that you have a light by your bed that is easy to reach. Do not use any sheets or blankets that are too big for your bed. They should not hang down onto the floor. Have a firm chair that has side arms. You can  use this for support while you get dressed. Do not have throw rugs and other things on the floor that can make you trip. What can I do in the kitchen? Clean up any spills right away. Avoid walking on wet floors. Keep items that you use a lot in easy-to-reach places. If you need to reach something above you, use a strong step stool that has a grab bar. Keep electrical cords out of the way. Do not use floor polish or wax that makes floors slippery. If you must use wax, use non-skid floor wax. Do not have throw rugs and other things on the floor that can make you trip. What can I do with my stairs? Do not leave any items on the stairs. Make sure that there are handrails on both sides of the stairs and use them. Fix handrails that are broken or loose. Make sure that handrails are as long as the stairways. Check any carpeting to make sure that it is firmly attached to the stairs. Fix any carpet that is loose or worn. Avoid having throw rugs at the top or bottom of the stairs. If you do have throw rugs, attach them to the floor with carpet tape. Make sure that you have a light switch at the top of the stairs and the bottom of  the stairs. If you do not have them, ask someone to add them for you. What else can I do to help prevent falls? Wear shoes that: Do not have high heels. Have rubber bottoms. Are comfortable and fit you well. Are closed at the toe. Do not wear sandals. If you use a stepladder: Make sure that it is fully opened. Do not climb a closed stepladder. Make sure that both sides of the stepladder are locked into place. Ask someone to hold it for you, if possible. Clearly mark and make sure that you can see: Any grab bars or handrails. First and last steps. Where the edge of each step is. Use tools that help you move around (mobility aids) if they are needed. These include: Canes. Walkers. Scooters. Crutches. Turn on the lights when you go into a dark area. Replace any light  bulbs as soon as they burn out. Set up your furniture so you have a clear path. Avoid moving your furniture around. If any of your floors are uneven, fix them. If there are any pets around you, be aware of where they are. Review your medicines with your doctor. Some medicines can make you feel dizzy. This can increase your chance of falling. Ask your doctor what other things that you can do to help prevent falls. This information is not intended to replace advice given to you by your health care provider. Make sure you discuss any questions you have with your health care provider. Document Released: 08/26/2009 Document Revised: 04/06/2016 Document Reviewed: 12/04/2014 Elsevier Interactive Patient Education  2017 [employer].

## 2024-01-10 NOTE — Progress Notes (Signed)
 Subjective:   Samuel Flores is a 83 y.o. male who presents for Medicare Annual/Subsequent preventive examination.  Visit Complete: Virtual I connected with  Samuel Flores on 01/10/24 by a audio enabled telemedicine application and verified that I am speaking with the correct person using two identifiers.  Patient Location: Home  Provider Location: Home Office  I discussed the limitations of evaluation and management by telemedicine. The patient expressed understanding and agreed to proceed.  Vital Signs: Because this visit was a virtual/telehealth visit, some criteria may be missing or patient reported. Any vitals not documented were not able to be obtained and vitals that have been documented are patient reported.   Cardiac Risk Factors include: advanced age (>6men, >56 women);male gender;hypertension     Objective:    Today's Vitals   01/10/24 0925  Weight: 166 lb 9.6 oz (75.6 kg)   Body mass index is 23.9 kg/m.     01/10/2024    9:08 AM 11/21/2022   11:33 AM 04/18/2021    6:10 AM 03/03/2020    3:00 PM 07/29/2018    8:59 AM 09/27/2017    3:07 AM 02/20/2017   12:08 PM  Advanced Directives  Does Patient Have a Medical Advance Directive? No No No Yes No Yes Yes  Type of Advance Directive    Living will  Living will;Healthcare Power of Attorney Living will  Does patient want to make changes to medical advance directive?    Yes (MAU/Ambulatory/Procedural Areas - Information given)  Yes (Inpatient - patient requests chaplain consult to change a medical advance directive)   Would patient like information on creating a medical advance directive? No - Patient declined Yes (MAU/Ambulatory/Procedural Areas - Information given) No - Patient declined  No - Patient declined      Current Medications (verified) Outpatient Encounter Medications as of 01/10/2024  Medication Sig   allopurinol (ZYLOPRIM) 100 MG tablet Take 2 tablets (200 mg total) by mouth daily.   apixaban (ELIQUIS)  5 MG TABS tablet Take 1 tablet (5 mg total) by mouth 2 (two) times daily.   diltiazem (CARDIZEM CD) 180 MG 24 hr capsule TAKE 1 CAPSULE BY MOUTH EVERY DAY   folic acid (FOLVITE) 800 MCG tablet Take 800 mcg by mouth daily.   gabapentin (NEURONTIN) 300 MG capsule Take 1 capsule (300 mg total) by mouth 2 (two) times daily. Take 1 tablet (300mg ) daily and can increase to 600mg  as needed   metoprolol tartrate (LOPRESSOR) 50 MG tablet Take 1 tablet (50 mg total) by mouth 2 (two) times daily.   spironolactone (ALDACTONE) 50 MG tablet TAKE 1 TABLET BY MOUTH EVERY DAY   No facility-administered encounter medications on file as of 01/10/2024.    Allergies (verified) Patient has no known allergies.   History: Past Medical History:  Diagnosis Date   Alcohol abuse, daily use 08/18/2013   Alcoholic cirrhosis of liver with ascites (HCC) 08/01/2018   Arthritis    osteoarthritis   CAD (coronary artery disease)    Non obstructive   Cancer (HCC) 1994   prostate   CHF (congestive heart failure) (HCC)    Diastolic dysfunction Echo 2012   Grade 1   Essential hypertension 07/15/2013   Gout    Hx of subdural hematoma 03/2011   Hx-TIA (transient ischemic attack) 2011   Hypercholesteremia    Hypertension    Stroke [facility])    ? mini stroke    Past Surgical History:  Procedure Laterality Date   BURR HOLE FOR  SUBDURAL HEMATOMA  03/2011   CARDIOVERSION N/A 02/20/2017   Procedure: CARDIOVERSION;  Surgeon: Yates Decamp, MD;  Location: [facility] ENDOSCOPY;  Service: Cardiovascular;  Laterality: N/A;   CHOLECYSTECTOMY N/A 08/08/2018   Procedure: LAPAROSCOPIC CHOLECYSTECTOMY;  Surgeon: Almond Lint, MD;  Location: [facility] ORS;  Service: General;  Laterality: N/A;   EYE SURGERY Left 03/2009   cataract   LEFT HEART CATHETERIZATION WITH CORONARY ANGIOGRAM N/A 01/02/2013   Procedure: LEFT HEART CATHETERIZATION WITH CORONARY ANGIOGRAM;  Surgeon: Pamella Pert, MD;  Location: [facility] CATH LAB;  Service: Cardiovascular;  Laterality: N/A;    NASAL FLAP ROTATION Right 04/18/2021   Procedure: Reconstruction of right nasal Mohs defect with flaps and grafts as necessary;  Surgeon: Allena Napoleon, MD;  Location: [facility] OR;  Service: Plastics;  Laterality: Right;   PROSTATE SURGERY  1994   SKIN SURGERY     SPINE SURGERY     cervical   Family History  Problem Relation Age of Onset   Lung cancer Mother        never smoker   Prostate cancer Father    Hypertension Sister    Hypertension Sister    Pancreatic cancer Brother    Social History   Socioeconomic History   Marital status: Married    Spouse name: Not on file   Number of children: 2   Years of education: Not on file   Highest education level: Not on file  Occupational History   Occupation: Previous work at Systems analyst course    Employer: RETIRED  Tobacco Use   Smoking status: Former    Types: Pipe    Quit date: 04/09/2013    Years since quitting: 10.7   Smokeless tobacco: Never  Vaping Use   Vaping status: Never Used  Substance and Sexual Activity   Alcohol use: Not Currently    Comment: Pt has not had alcohol in 15 months    Drug use: No   Sexual activity: Never  Other Topics Concern   Not on file  Social History Narrative   Not on file   Social Drivers of Health   Financial Resource Strain: Low Risk  (11/21/2022)   Overall Financial Resource Strain (CARDIA)    Difficulty of Paying Living Expenses: Not hard at all  Food Insecurity: No Food Insecurity (01/10/2024)   Hunger Vital Sign    Worried About Running Out of Food in the Last Year: Never true    Ran Out of Food in the Last Year: Never true  Transportation Needs: No Transportation Needs (11/21/2022)   PRAPARE - Administrator, Civil Service (Medical): No    Lack of Transportation (Non-Medical): No  Physical Activity: Inactive (01/10/2024)   Exercise Vital Sign    Days of Exercise per Week: 0 days    Minutes of Exercise per Session: 0 min  Stress: No Stress Concern Present (11/21/2022)   [employer] of Occupational Health - Occupational Stress Questionnaire    Feeling of Stress : Not at all  Social Connections: Socially Isolated (01/10/2024)   Social Connection and Isolation Panel [NHANES]    Frequency of Communication with Friends and Family: Never    Frequency of Social Gatherings with Friends and Family: Never    Attends Religious Services: Never    Database administrator or Organizations: No    Attends Banker Meetings: Never    Marital Status: Married    Tobacco Counseling Counseling given: Not Answered   Clinical Intake:  Pre-visit  preparation completed: Yes  Pain : No/denies pain     Diabetes: No  How often do you need to have someone help you when you read instructions, pamphlets, or other written materials from your doctor or pharmacy?: 1 - Never  Interpreter Needed?: No  Information entered by :: Remi Haggard LPN   Activities of Daily Living    01/10/2024    9:41 AM  In your present state of health, do you have any difficulty performing the following activities:  Hearing? 1  Vision? 0  Difficulty concentrating or making decisions? 0  Walking or climbing stairs? 1  Dressing or bathing? 0  Doing errands, shopping? 0  Preparing Food and eating ? N  Using the Toilet? N  In the past six months, have you accidently leaked urine? N  Do you have problems with loss of bowel control? N  Managing your Medications? N  Managing your Finances? N  Housekeeping or managing your Housekeeping? N    Patient Care Team: Donita Brooks, MD as PCP - General (Family Medicine) Yates Decamp, MD as PCP - Cardiology (Cardiology) Erroll Luna, [facility] (Inactive) as Pharmacist (Pharmacist) Yates Decamp, MD as Consulting Physician (Cardiology) Clotilde Dieter, DO as Consulting Physician (Cardiology)  Indicate any recent Medical Services you may have received from other than [facility] providers in the past year (date may be approximate).     Assessment:    This is a routine wellness examination for Caspian.  Hearing/Vision screen Hearing Screening - Comments:: Does not wear hearing aids Yes trouble hearing Vision Screening - Comments:: Sharlot Gowda Up to date   Goals Addressed   None    Depression Screen    01/10/2024    9:07 AM 11/27/2023    3:10 PM 11/19/2023   10:15 AM 11/01/2023   12:17 PM 12/14/2022   11:17 AM 11/21/2022   11:32 AM 03/18/2021    3:02 PM  PHQ 2/9 Scores  PHQ - 2 Score 2 0 0 0 0 0 0  PHQ- 9 Score 3 0  0       Fall Risk    01/10/2024    9:03 AM 11/27/2023    3:10 PM 11/19/2023   10:15 AM 12/14/2022   11:17 AM 03/18/2021    3:02 PM  Fall Risk   Falls in the past year? 0 0 0 0 0  Number falls in past yr: 0 0 0 0   Injury with Fall? 0 0 0 0   Risk for fall due to :  No Fall Risks  No Fall Risks No Fall Risks  Follow up Falls evaluation completed;Education provided;Falls prevention discussed Falls prevention discussed  Falls prevention discussed Falls evaluation completed    MEDICARE RISK AT HOME: Medicare Risk at Home Any stairs in or around the home?: Yes If so, are there any without handrails?: No Home free of loose throw rugs in walkways, pet beds, electrical cords, etc?: Yes Adequate lighting in your home to reduce risk of falls?: Yes Life alert?: No Use of a cane, walker or w/c?: No Grab bars in the bathroom?: Yes Shower chair or bench in shower?: Yes Elevated toilet seat or a handicapped toilet?: Yes  TIMED UP AND GO:  Was the test performed?  No    Cognitive Function:        01/10/2024    9:05 AM 11/21/2022   11:33 AM  6CIT Screen  What Year? 0 points 0 points  What month? 0 points 0 points  What  time? 0 points 0 points  Count back from 20 0 points 0 points  Months in reverse  0 points  Repeat phrase 2 points 2 points  Total Score  2 points    Immunizations Immunization History  Administered Date(s) Administered   Fluad Quad(high Dose 65+) 10/20/2020, 08/31/2021, 08/28/2022   Influenza, High  Dose Seasonal PF 08/20/2018   Influenza,inj,Quad PF,6+ Mos 10/24/2016, 09/11/2017, 09/01/2019   PFIZER(Purple Top)SARS-COV-2 Vaccination 03/04/2020, 03/29/2020   Pneumococcal Conjugate Pcv21, Polysaccharide Crm197 Conjugaf 11/19/2023   Pneumococcal Conjugate-13 11/23/2017   Zoster, Live 09/05/2011    TDAP status: Due, Education has been provided regarding the importance of this vaccine. Advised may receive this vaccine at local pharmacy or Health Dept. Aware to provide a copy of the vaccination record if obtained from local pharmacy or Health Dept. Verbalized acceptance and understanding.  Flu Vaccine status: Up to date  Pneumococcal vaccine status: Due, Education has been provided regarding the importance of this vaccine. Advised may receive this vaccine at local pharmacy or Health Dept. Aware to provide a copy of the vaccination record if obtained from local pharmacy or Health Dept. Verbalized acceptance and understanding.  Covid-19 vaccine status: Information provided on how to obtain vaccines.   Qualifies for Shingles Vaccine? Yes   Zostavax completed No   Shingrix Completed?: No.    Education has been provided regarding the importance of this vaccine. Patient has been advised to call insurance company to determine out of pocket expense if they have not yet received this vaccine. Advised may also receive vaccine at local pharmacy or Health Dept. Verbalized acceptance and understanding.  Screening Tests Health Maintenance  Topic Date Due   COVID-19 Vaccine (3 - Pfizer risk series) 04/26/2020   Pneumonia Vaccine 55+ Years old (2 of 2 - PPSV23 or PCV20) 02/12/2024 (Originally 01/18/2018)   Zoster Vaccines- Shingrix (1 of 2) 02/17/2024 (Originally 06/16/1960)   Medicare Annual Wellness (AWV)  01/09/2025   INFLUENZA VACCINE  Completed   HPV VACCINES  Aged Out   DTaP/Tdap/Td  Discontinued    Health Maintenance  Health Maintenance Due  Topic Date Due   COVID-19 Vaccine (3 - Pfizer risk  series) 04/26/2020    Colorectal cancer screening: No longer required.   Lung Cancer Screening: (Low Dose CT Chest recommended if Age 73-80 years, 20 pack-year currently smoking OR have quit w/in 15years.) does not qualify.   Lung Cancer Screening Referral:   Additional Screening:  Hepatitis C Screening: does not qualify;   Vision Screening: Recommended annual ophthalmology exams for early detection of glaucoma and other disorders of the eye. Is the patient up to date with their annual eye exam?  Yes  Who is the provider or what is the name of the office in which the patient attends annual eye exams? [facility] If pt is not established with a provider, would they like to be referred to a provider to establish care? No .   Dental Screening: Recommended annual dental exams for proper oral hygiene    Community Resource Referral / Chronic Care Management: CRR required this visit?  No   CCM required this visit?  No     Plan:     I have personally reviewed and noted the following in the patient's chart:   Medical and social history Use of alcohol, tobacco or illicit drugs  Current medications and supplements including opioid prescriptions. Patient is not currently taking opioid prescriptions. Functional ability and status Nutritional status Physical activity Advanced directives List of other physicians  Hospitalizations, surgeries, and ER visits in previous 12 months Vitals Screenings to include cognitive, depression, and falls Referrals and appointments  In addition, I have reviewed and discussed with patient certain preventive protocols, quality metrics, and best practice recommendations. A written personalized care plan for preventive services as well as general preventive health recommendations were provided to patient.     Remi Haggard, LPN   12/01/1476   After Visit Summary: (MyChart) Due to this being a telephonic visit, the after visit summary with patients personalized  plan was offered to patient via MyChart   Nurse Notes:

## 2024-01-15 ENCOUNTER — Ambulatory Visit (INDEPENDENT_AMBULATORY_CARE_PROVIDER_SITE_OTHER): Payer: Medicare Other | Admitting: Family Medicine

## 2024-01-15 ENCOUNTER — Encounter: Payer: Self-pay | Admitting: Family Medicine

## 2024-01-15 VITALS — BP 116/64 | HR 84 | Temp 98.2°F | Ht 70.0 in | Wt 168.4 lb

## 2024-01-15 DIAGNOSIS — M79604 Pain in right leg: Secondary | ICD-10-CM | POA: Diagnosis not present

## 2024-01-15 MED ORDER — HYDROCODONE-ACETAMINOPHEN 5-325 MG PO TABS: 1.0000 | ORAL_TABLET | Freq: Four times a day (QID) | ORAL | 0 refills | Status: DC | PRN

## 2024-01-15 NOTE — Progress Notes (Signed)
 Subjective:    Patient ID: Samuel Flores, male    DOB: 03-08-1941, 83 y.o.   MRN: [id number]  Leg Pain   11/27/23 Patient continues to have severe pain in his right leg distal to his right knee.  The pain tends to be along the anterior lateral right shin.  My partner recently increase his gabapentin to 600 mg twice a day but that did nothing to help his pain.  I obtain an x-ray of the right tibia and fibula to rule out any malignancies or abnormal lesions on the bone.  The x-ray was unremarkable.  The patient states that there is no exacerbating factor.  There is no alleviating factor.  The pain aches and throbs for no reason.  It tends to be worse at night.  Walking does not make the pain worse.  Movement does not make the pain better.  The patient does have some pain around his knee.  At that time, my plan was: Anterior right leg pain does not follow any particular diagnosis.  At the present time there is no evidence of any DVT or peripheral artery disease.  The patient is having pain in his knees and perhaps is having referred pain from arthritis in the knee however the pain is out of proportion over the anterior right shin.  Given the negative x-ray, I recommend trying a cortisone injection in his right knee.  Using sterile technique, I injected the right knee with 2 cc of lidocaine, 2 cc of Marcaine, and 2 cc of 40 mg/mL Kenalog.  The patient tolerated the procedure well.  If the pain is not improving, the next step would be an MRI of the right lower leg versus additional medications for neuropathic pain  01/15/24 Patient states that he saw significant benefit from the cortisone injection in his right knee.  The pain began to improve later that day.  The benefit from the injection lasted several weeks but then he reaggravated his leg shoveling snow.  He now complains of pain over his right lateral shin radiating from the knee down his right lateral shin all the way to the foot.  He is also having  pain behind his right knee and in his lower hamstring.  The distribution with certainly sound neuropathic however he responded dramatically to a cortisone injection in the knee making me feel some of the pain in his leg is due to arthritis in the knee Past Medical History:  Diagnosis Date   Alcohol abuse, daily use 08/18/2013   Alcoholic cirrhosis of liver with ascites (HCC) 08/01/2018   Arthritis    osteoarthritis   CAD (coronary artery disease)    Non obstructive   Cancer (HCC) 1994   prostate   CHF (congestive heart failure) (HCC)    Diastolic dysfunction Echo 2012   Grade 1   Essential hypertension 07/15/2013   Gout    Hx of subdural hematoma 03/2011   Hx-TIA (transient ischemic attack) 2011   Hypercholesteremia    Hypertension    Stroke [facility])    ? mini stroke    Past Surgical History:  Procedure Laterality Date   BURR HOLE FOR SUBDURAL HEMATOMA  03/2011   CARDIOVERSION N/A 02/20/2017   Procedure: CARDIOVERSION;  Surgeon: Yates Decamp, MD;  Location: [facility] ENDOSCOPY;  Service: Cardiovascular;  Laterality: N/A;   CHOLECYSTECTOMY N/A 08/08/2018   Procedure: LAPAROSCOPIC CHOLECYSTECTOMY;  Surgeon: Almond Lint, MD;  Location: [facility] ORS;  Service: General;  Laterality: N/A;   EYE SURGERY Left  03/2009   cataract   LEFT HEART CATHETERIZATION WITH CORONARY ANGIOGRAM N/A 01/02/2013   Procedure: LEFT HEART CATHETERIZATION WITH CORONARY ANGIOGRAM;  Surgeon: Pamella Pert, MD;  Location: [facility] CATH LAB;  Service: Cardiovascular;  Laterality: N/A;   NASAL FLAP ROTATION Right 04/18/2021   Procedure: Reconstruction of right nasal Mohs defect with flaps and grafts as necessary;  Surgeon: Allena Napoleon, MD;  Location: [facility] OR;  Service: Plastics;  Laterality: Right;   PROSTATE SURGERY  1994   SKIN SURGERY     SPINE SURGERY     cervical   Current Outpatient Medications on File Prior to Visit  Medication Sig Dispense Refill   allopurinol (ZYLOPRIM) 100 MG tablet Take 2 tablets (200 mg total) by mouth  daily. 180 tablet 1   apixaban (ELIQUIS) 5 MG TABS tablet Take 1 tablet (5 mg total) by mouth 2 (two) times daily. 56 tablet 0   diltiazem (CARDIZEM CD) 180 MG 24 hr capsule TAKE 1 CAPSULE BY MOUTH EVERY DAY 90 capsule 3   folic acid (FOLVITE) 800 MCG tablet Take 800 mcg by mouth daily.     gabapentin (NEURONTIN) 300 MG capsule Take 1 capsule (300 mg total) by mouth 2 (two) times daily. Take 1 tablet (300mg ) daily and can increase to 600mg  as needed 270 capsule 3   metoprolol tartrate (LOPRESSOR) 50 MG tablet Take 1 tablet (50 mg total) by mouth 2 (two) times daily. 90 tablet 3   spironolactone (ALDACTONE) 50 MG tablet TAKE 1 TABLET BY MOUTH EVERY DAY 90 tablet 3   No current facility-administered medications on file prior to visit.   No Known Allergies Social History   Socioeconomic History   Marital status: Married    Spouse name: Not on file   Number of children: 2   Years of education: Not on file   Highest education level: Not on file  Occupational History   Occupation: Previous work at golf course    Employer: RETIRED  Tobacco Use   Smoking status: Former    Types: Pipe    Quit date: 04/09/2013    Years since quitting: 10.7   Smokeless tobacco: Never  Vaping Use   Vaping status: Never Used  Substance and Sexual Activity   Alcohol use: Not Currently    Comment: Pt has not had alcohol in 15 months    Drug use: No   Sexual activity: Never  Other Topics Concern   Not on file  Social History Narrative   Not on file   Social Drivers of Health   Financial Resource Strain: Low Risk  (11/21/2022)   Overall Financial Resource Strain (CARDIA)    Difficulty of Paying Living Expenses: Not hard at all  Food Insecurity: No Food Insecurity (01/10/2024)   Hunger Vital Sign    Worried About Running Out of Food in the Last Year: Never true    Ran Out of Food in the Last Year: Never true  Transportation Needs: No Transportation Needs (11/21/2022)   PRAPARE - Scientist, research (physical sciences) (Medical): No    Lack of Transportation (Non-Medical): No  Physical Activity: Inactive (01/10/2024)   Exercise Vital Sign    Days of Exercise per Week: 0 days    Minutes of Exercise per Session: 0 min  Stress: No Stress Concern Present (11/21/2022)   [employer] of Occupational Health - Occupational Stress Questionnaire    Feeling of Stress : Not at all  Social Connections: Socially Isolated (01/10/2024)  Social Advertising account executive [NHANES]    Frequency of Communication with Friends and Family: Never    Frequency of Social Gatherings with Friends and Family: Never    Attends Religious Services: Never    Database administrator or Organizations: No    Attends Banker Meetings: Never    Marital Status: Married  Catering manager Violence: Not At Risk (01/10/2024)   Humiliation, Afraid, Rape, and Kick questionnaire    Fear of Current or Ex-Partner: No    Emotionally Abused: No    Physically Abused: No    Sexually Abused: No      Review of Systems  All other systems reviewed and are negative.      Objective:   Physical Exam Vitals reviewed.  Constitutional:      General: He is not in acute distress.    Appearance: He is obese. He is not ill-appearing or toxic-appearing.  HENT:     Head: Normocephalic and atraumatic.  Cardiovascular:     Rate and Rhythm: Normal rate. Rhythm irregular.     Heart sounds: Normal heart sounds. No murmur heard.    No friction rub. No gallop.  Pulmonary:     Effort: Pulmonary effort is normal. No respiratory distress.     Breath sounds: Normal breath sounds. No stridor. No wheezing or rhonchi.  Abdominal:     General: Bowel sounds are normal. There is no distension.     Palpations: Abdomen is soft.     Tenderness: There is no abdominal tenderness.  Musculoskeletal:     Right lower leg: No edema.     Left lower leg: No edema.  Skin:    Findings: No erythema or rash.  Neurological:     General: No  focal deficit present.     Mental Status: He is alert and oriented to person, place, and time.     Cranial Nerves: No cranial nerve deficit.     Motor: No weakness.  Psychiatric:        Mood and Affect: Mood normal.        Behavior: Behavior normal.        Thought Content: Thought content normal.     Patient has 2/4 dorsalis pedis and posterior tibialis pulses bilaterally.  Skin is warm and intact.  There is no evidence of cellulitis.  There is no peripheral edema.  Patient has full range of motion in the knee and in the ankle.      Assessment & Plan:  Right leg pain Patient is unable to take NSAIDs due to the fact he is on Eliquis.  He is too soon for another cortisone injection in the knee.  He is requesting something that he can take at night to help him relax and rest.  He states that the aching throbbing pain in his leg keeps him awake.  I am willing to give the patient Norco 5/325 1 p.o. nightly.  I will give him 30 pills to last 1 month.  I cautioned the patient about constipation, confusion, and dizziness.  If he develops any delirium or confusion or dizziness he will stop the medication.  He is comfortable with this plan

## 2024-02-12 ENCOUNTER — Other Ambulatory Visit: Payer: Self-pay | Admitting: Cardiology

## 2024-02-12 DIAGNOSIS — G4762 Sleep related leg cramps: Secondary | ICD-10-CM

## 2024-02-12 DIAGNOSIS — I4821 Permanent atrial fibrillation: Secondary | ICD-10-CM

## 2024-03-21 DIAGNOSIS — H53453 Other localized visual field defect, bilateral: Secondary | ICD-10-CM | POA: Diagnosis not present

## 2024-04-02 ENCOUNTER — Telehealth: Payer: Self-pay

## 2024-04-02 NOTE — Telephone Encounter (Signed)
 Pt brought form by for prescription to be sent to [facility] to have [employer] tax reimbursed to him. Rx faxed to [phone]. Mjp,lpn

## 2024-04-14 ENCOUNTER — Ambulatory Visit: Payer: Self-pay

## 2024-04-14 NOTE — Telephone Encounter (Signed)
  Chief Complaint: right groin and right knee pain Symptoms:  Frequency: worse over past three weeks Pertinent Negatives: Patient denies injury Disposition: [] ED /[] Urgent Care (no appt availability in office) / [x] Appointment(In office/virtual)/ []  [place] [facility]/ [] Home Care/ [] Refused Recommended Disposition /[] [facility] [facility]/ []  Follow-up with PCP Additional Notes: patient with right knee pain, requesting steroid injection. New right groin pain Using home remedies with minimal relief Copied from CRM [id number]. Topic: Clinical - Red Word Triage >> Apr 14, 2024 12:24 PM Chrystal Crape R wrote: Pt having pain from his groin to his legs while walking would like to schedule an appointment. Also interested in receiving a cortisone shot in knee. Reason for Disposition  [1] After 2 weeks AND [2] still painful or swollen  Answer Assessment - Initial Assessment Questions 1. MECHANISM: "How did the injury happen?" (e.g., twisting injury, direct blow)      No injury 2. ONSET: "When did the injury happen?" (Minutes or hours ago)      Pain intensified over past three weeks 3. LOCATION: "Where is the injury located?"      Right groin, radiating down leg 4. APPEARANCE of INJURY: "What does the injury look like?"  (e.g., looks normal; bruise, swelling)     No redness, swelling, or bruising 5. PAIN: "Is there pain?" If Yes, ask: "How bad is the pain?"   "What does it keep you from doing?" (e.g., Scale 1-10; or mild, moderate, severe)   -  NONE: (0): No pain.   -  MILD (1-3): Doesn't interfere with normal activities.    -  MODERATE (4-7): Interferes with normal activities (e.g., work or school) or awakens from sleep, limping.    -  SEVERE (8-10): Excruciating pain, unable to do any normal activities, unable to walk.     7, now.  9 at worse 6. SIZE: For cuts, bruises, or swelling, ask: "How large is it?" (e.g., inches or centimeters;  entire joint)      N/A 7. TETANUS: For any breaks in the  skin, ask: "When was the last tetanus booster?"     N/A 8. OTHER SYMPTOMS: "Do you have any other symptoms?"      Right knee pain  Protocols used: Groin Injury and Strain-A-AH

## 2024-04-28 ENCOUNTER — Ambulatory Visit: Admitting: Family Medicine

## 2024-04-28 ENCOUNTER — Encounter: Payer: Self-pay | Admitting: Family Medicine

## 2024-04-28 VITALS — BP 120/60 | HR 41 | Temp 98.8°F | Ht 70.0 in | Wt 169.0 lb

## 2024-04-28 DIAGNOSIS — M79604 Pain in right leg: Secondary | ICD-10-CM

## 2024-04-28 DIAGNOSIS — D692 Other nonthrombocytopenic purpura: Secondary | ICD-10-CM | POA: Diagnosis not present

## 2024-04-28 NOTE — Progress Notes (Signed)
 Subjective:    Patient ID: Samuel Flores, male    DOB: 11/02/1941, 83 y.o.   MRN: [id number]  Leg Pain   Knee Pain   Groin Pain  11/27/23 Patient continues to have severe pain in his right leg distal to his right knee.  The pain tends to be along the anterior lateral right shin.  My partner recently increase his gabapentin  to 600 mg twice a day but that did nothing to help his pain.  I obtain an x-ray of the right tibia and fibula to rule out any malignancies or abnormal lesions on the bone.  The x-ray was unremarkable.  The patient states that there is no exacerbating factor.  There is no alleviating factor.  The pain aches and throbs for no reason.  It tends to be worse at night.  Walking does not make the pain worse.  Movement does not make the pain better.  The patient does have some pain around his knee.  At that time, my plan was: Anterior right leg pain does not follow any particular diagnosis.  At the present time there is no evidence of any DVT or peripheral artery disease.  The patient is having pain in his knees and perhaps is having referred pain from arthritis in the knee however the pain is out of proportion over the anterior right shin.  Given the negative x-ray, I recommend trying a cortisone injection in his right knee.  Using sterile technique, I injected the right knee with 2 cc of lidocaine , 2 cc of Marcaine , and 2 cc of 40 mg/mL Kenalog .  The patient tolerated the procedure well.  If the pain is not improving, the next step would be an MRI of the right lower leg versus additional medications for neuropathic pain  01/15/24 Patient states that he saw significant benefit from the cortisone injection in his right knee.  The pain began to improve later that day.  The benefit from the injection lasted several weeks but then he reaggravated his leg shoveling snow.  He now complains of pain over his right lateral shin radiating from the knee down his right lateral shin all the way to  the foot.  He is also having pain behind his right knee and in his lower hamstring.  The distribution with certainly sound neuropathic however he responded dramatically to a cortisone injection in the knee making me feel some of the pain in his leg is due to arthritis in the knee  04/28/24 Patient is here today requesting a cortisone injection in his right knee.  It has been more than 3 months.  He saw substantial benefit from it previously.  However he also complains of pain in his right anterior hip.  He states the pain is constant.  It hurts worse if he tries to lift something.  He has not seen a bulge.  The pain is located in the right inguinal canal.  Today on exam there is no palpable bulge or hernia on Valsalva.  He has full flexion and extension internal and external rotation of the right hip without pain.  I am unable to appreciate any lymphadenopathy.  There is no mass on his testicle.  He has no testicular pain. Past Medical History:  Diagnosis Date   Alcohol abuse, daily use 08/18/2013   Alcoholic cirrhosis of liver with ascites (HCC) 08/01/2018   Arthritis    osteoarthritis   CAD (coronary artery disease)    Non obstructive   Cancer (HCC) 1994  prostate   CHF (congestive heart failure) (HCC)    Diastolic dysfunction Echo 2012   Grade 1   Essential hypertension 07/15/2013   Gout    Hx of subdural hematoma 03/2011   Hx-TIA (transient ischemic attack) 2011   Hypercholesteremia    Hypertension    Stroke [facility])    ? mini stroke    Past Surgical History:  Procedure Laterality Date   BURR HOLE FOR SUBDURAL HEMATOMA  03/2011   CARDIOVERSION N/A 02/20/2017   Procedure: CARDIOVERSION;  Surgeon: Knox Perl, MD;  Location: [facility] ENDOSCOPY;  Service: Cardiovascular;  Laterality: N/A;   CHOLECYSTECTOMY N/A 08/08/2018   Procedure: LAPAROSCOPIC CHOLECYSTECTOMY;  Surgeon: Lockie Rima, MD;  Location: [facility] ORS;  Service: General;  Laterality: N/A;   EYE SURGERY Left 03/2009   cataract   LEFT HEART  CATHETERIZATION WITH CORONARY ANGIOGRAM N/A 01/02/2013   Procedure: LEFT HEART CATHETERIZATION WITH CORONARY ANGIOGRAM;  Surgeon: Jessica Morn, MD;  Location[facility] C[facility] LAB;  Service: Cardiovascular;  Laterality: N/A;   NASAL FLAP ROTATION Right 04/18/2021   Procedure: Reconstruction of right nasal Mohs defect with flaps and grafts as necessary;  Surgeon: Barb Bonito, MD;  Location: [facility] OR;  Service: Plastics;  Laterality: Right;   PROSTATE SURGERY  1994   SKIN SURGERY     SPINE SURGERY     cervical   Current Outpatient Medications on File Prior to Visit  Medication Sig Dispense Refill   allopurinol  (ZYLOPRIM ) 100 MG tablet Take 2 tablets (200 mg total) by mouth daily. 180 tablet 1   apixaban  (ELIQUIS ) 5 MG TABS tablet Take 1 tablet (5 mg total) by mouth 2 (two) times daily. 56 tablet 0   diltiazem  (CARDIZEM  CD) 180 MG 24 hr capsule TAKE 1 CAPSULE BY MOUTH EVERY DAY 90 capsule 3   folic acid  (FOLVITE ) 800 MCG tablet Take 800 mcg by mouth daily.     gabapentin  (NEURONTIN ) 300 MG capsule Take 1 capsule (300 mg total) by mouth 2 (two) times daily. Take 1 tablet (300mg ) daily and can increase to 600mg  as needed 270 capsule 3   HYDROcodone -acetaminophen  (NORCO/VICODIN) 5-325 MG tablet Take 1 tablet by mouth every 6 (six) hours as needed for moderate pain (pain score 4-6). 30 tablet 0   spironolactone  (ALDACTONE ) 50 MG tablet TAKE 1 TABLET BY MOUTH EVERY DAY 90 tablet 3   metoprolol  tartrate (LOPRESSOR ) 50 MG tablet TAKE 1 TABLET BY MOUTH TWICE A DAY 180 tablet 1   No current facility-administered medications on file prior to visit.   No Known Allergies Social History   Socioeconomic History   Marital status: Married    Spouse name: Not on file   Number of children: 2   Years of education: Not on file   Highest education level: Not on file  Occupational History   Occupation: Previous work at golf [employer]    Employer: RETIRED  Tobacco Use   Smoking status: Former    Types: Pipe     Quit date: 04/09/2013    Years since quitting: 11.0   Smokeless tobacco: Never  Vaping Use   Vaping status: Never Used  Substance and Sexual Activity   Alcohol use: Not Currently    Comment: Pt has not had alcohol in 15 months    Drug use: No   Sexual activity: Never  Other Topics Concern   Not on file  Social History Narrative   Not on file   Social Drivers of Health   Financial Resource Strain:  Low Risk  (11/21/2022)   Overall Financial Resource Strain (CARDIA)    Difficulty of Paying Living Expenses: Not hard at all  Food Insecurity: No Food Insecurity (01/10/2024)   Hunger Vital Sign    Worried About Running Out of Food in the Last Year: Never true    Ran Out of Food in the Last Year: Never true  Transportation Needs: No Transportation Needs (11/21/2022)   PRAPARE - Administrator, Civil Service (Medical): No    Lack of Transportation (Non-Medical): No  Physical Activity: Inactive (01/10/2024)   Exercise Vital Sign    Days of Exercise per Week: 0 days    Minutes of Exercise per Session: 0 min  Stress: No Stress Concern Present (11/21/2022)   [employer] of Occupational Health - Occupational Stress Questionnaire    Feeling of Stress : Not at all  Social Connections: Socially Isolated (01/10/2024)   Social Connection and Isolation Panel    Frequency of Communication with Friends and Family: Never    Frequency of Social Gatherings with Friends and Family: Never    Attends Religious Services: Never    Database administrator or Organizations: No    Attends Banker Meetings: Never    Marital Status: Married  Catering manager Violence: Not At Risk (01/10/2024)   Humiliation, Afraid, Rape, and Kick questionnaire    Fear of Current or Ex-Partner: No    Emotionally Abused: No    Physically Abused: No    Sexually Abused: No      Review of Systems  All other systems reviewed and are negative.      Objective:   Physical Exam Vitals reviewed.   Constitutional:      General: He is not in acute distress.    Appearance: He is obese. He is not ill-appearing or toxic-appearing.  HENT:     Head: Normocephalic and atraumatic.   Cardiovascular:     Rate and Rhythm: Normal rate. Rhythm irregular.     Heart sounds: Normal heart sounds. No murmur heard.    No friction rub. No gallop.  Pulmonary:     Effort: Pulmonary effort is normal. No respiratory distress.     Breath sounds: Normal breath sounds. No stridor. No wheezing or rhonchi.  Abdominal:     General: Bowel sounds are normal. There is no distension.     Palpations: Abdomen is soft.     Tenderness: There is no abdominal tenderness.     Hernia: There is no hernia in the left inguinal area or right inguinal area.  Genitourinary:    Penis: Normal.      Testes: Normal.        Right: Mass or tenderness not present.        Left: Mass or tenderness not present.   Musculoskeletal:     Right lower leg: No edema.     Left lower leg: No edema.  Lymphadenopathy:     Lower Body: No right inguinal adenopathy. No left inguinal adenopathy.   Skin:    Findings: Bruising present. No erythema or rash.   Neurological:     General: No focal deficit present.     Mental Status: He is alert and oriented to person, place, and time.     Cranial Nerves: No cranial nerve deficit.     Motor: No weakness.   Psychiatric:        Mood and Affect: Mood normal.        Behavior: Behavior normal.  Thought Content: Thought content normal.    Patient has large purpura on both shins.  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     Assessment & Plan:  Purpura (HCC) - Plan: CBC with Differential/Platelet, Comprehensive metabolic panel with GFR  Right leg pain Purpura seems exaggerated for regular senile purpura.  I will check a CBC and a CMP to evaluate for any liver dysfunction or thrombocytopenia.  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  I believe the patient has arthritis in his right knee.  This does seem to cause pain that radiates down his right anterior shin.  Using sterile technique, I injected the right knee with 2 cc of lidocaine , 2 cc of Marcaine , and 2 cc of 40 mg/mL Kenalog .  The patient tolerated the procedure well.  I believe the pain is reasonable now.  Performed care for a small hernia however I am unable to appreciate it today on exam.  We have decided to monitor this and avoid heavy lifting for a few weeks and see if it will get better.

## 2024-04-29 ENCOUNTER — Ambulatory Visit: Payer: Self-pay | Admitting: Family Medicine

## 2024-04-29 LAB — CBC WITH DIFFERENTIAL/PLATELET
Absolute Lymphocytes: 1358 {cells}/uL (ref 850–3900)
Absolute Monocytes: 868 {cells}/uL (ref 200–950)
Basophils Absolute: 56 {cells}/uL (ref 0–200)
Basophils Relative: 0.4 %
Eosinophils Absolute: 196 {cells}/uL (ref 15–500)
Eosinophils Relative: 1.4 %
HCT: 44 % (ref 38.5–50.0)
Hemoglobin: 14.7 g/dL (ref 13.2–17.1)
MCH: 33.3 pg — ABNORMAL HIGH (ref 27.0–33.0)
MCHC: 33.4 g/dL (ref 32.0–36.0)
MCV: 99.8 fL (ref 80.0–100.0)
MPV: 9.5 fL (ref 7.5–12.5)
Monocytes Relative: 6.2 %
Neutro Abs: 11522 {cells}/uL — ABNORMAL HIGH (ref 1500–7800)
Neutrophils Relative %: 82.3 %
Platelets: 286 10*3/uL (ref 140–400)
RBC: 4.41 10*6/uL (ref 4.20–5.80)
RDW: 12.2 % (ref 11.0–15.0)
Total Lymphocyte: 9.7 %
WBC: 14 10*3/uL — ABNORMAL HIGH (ref 3.8–10.8)

## 2024-04-29 LAB — COMPREHENSIVE METABOLIC PANEL WITH GFR
AG Ratio: 1.8 (calc) (ref 1.0–2.5)
ALT: 13 U/L (ref 9–46)
AST: 18 U/L (ref 10–35)
Albumin: 4.1 g/dL (ref 3.6–5.1)
Alkaline phosphatase (APISO): 94 U/L (ref 35–144)
BUN: 18 mg/dL (ref 7–25)
CO2: 27 mmol/L (ref 20–32)
Calcium: 9.1 mg/dL (ref 8.6–10.3)
Chloride: 101 mmol/L (ref 98–110)
Creat: 0.99 mg/dL (ref 0.70–1.22)
Globulin: 2.3 g/dL (ref 1.9–3.7)
Glucose, Bld: 109 mg/dL — ABNORMAL HIGH (ref 65–99)
Potassium: 4.9 mmol/L (ref 3.5–5.3)
Sodium: 139 mmol/L (ref 135–146)
Total Bilirubin: 0.6 mg/dL (ref 0.2–1.2)
Total Protein: 6.4 g/dL (ref 6.1–8.1)
eGFR: 75 mL/min/{1.73_m2} (ref >=60)

## 2024-05-05 DIAGNOSIS — M79604 Pain in right leg: Secondary | ICD-10-CM

## 2024-05-05 MED ORDER — TRIAMCINOLONE ACETONIDE 40 MG/ML IJ SUSP
40.0000 mg | Freq: Once | INTRAMUSCULAR | Status: AC
  Administered 2024-05-05: 40 mg via INTRAMUSCULAR

## 2024-05-05 MED ORDER — BUPIVACAINE HCL 0.25 % IJ SOLN
5.0000 mL | Freq: Once | INTRAMUSCULAR | Status: AC
  Administered 2024-05-05: 5 mL

## 2024-05-05 NOTE — Addendum Note (Signed)
 Addended by: CORINNA JESUSA SAUNDERS on: 05/05/2024 09:48 AM   Modules accepted: Orders

## 2024-06-06 ENCOUNTER — Encounter: Payer: Self-pay | Admitting: Family Medicine

## 2024-06-06 ENCOUNTER — Ambulatory Visit: Admitting: Family Medicine

## 2024-06-06 VITALS — BP 124/78 | HR 87 | Temp 98.4°F | Ht 70.0 in | Wt 166.4 lb

## 2024-06-06 DIAGNOSIS — D692 Other nonthrombocytopenic purpura: Secondary | ICD-10-CM | POA: Diagnosis not present

## 2024-06-06 NOTE — Progress Notes (Signed)
 Subjective:    Patient ID: Samuel Flores, male    DOB: 01-14-1941, 83 y.o.   MRN: [id number] 11/27/23 Patient continues to have severe pain in his right leg distal to his right knee.  The pain tends to be along the anterior lateral right shin.  My partner recently increase his gabapentin  to 600 mg twice a day but that did nothing to help his pain.  I obtain an x-ray of the right tibia and fibula to rule out any malignancies or abnormal lesions on the bone.  The x-ray was unremarkable.  The patient states that there is no exacerbating factor.  There is no alleviating factor.  The pain aches and throbs for no reason.  It tends to be worse at night.  Walking does not make the pain worse.  Movement does not make the pain better.  The patient does have some pain around his knee.  At that time, my plan was: Anterior right leg pain does not follow any particular diagnosis.  At the present time there is no evidence of any DVT or peripheral artery disease.  The patient is having pain in his knees and perhaps is having referred pain from arthritis in the knee however the pain is out of proportion over the anterior right shin.  Given the negative x-ray, I recommend trying a cortisone injection in his right knee.  Using sterile technique, I injected the right knee with 2 cc of lidocaine , 2 cc of Marcaine , and 2 cc of 40 mg/mL Kenalog .  The patient tolerated the procedure well.  If the pain is not improving, the next step would be an MRI of the right lower leg versus additional medications for neuropathic pain  01/15/24 Patient states that he saw significant benefit from the cortisone injection in his right knee.  The pain began to improve later that day.  The benefit from the injection lasted several weeks but then he reaggravated his leg shoveling snow.  He now complains of pain over his right lateral shin radiating from the knee down his right lateral shin all the way to the foot.  He is also having pain behind his  right knee and in his lower hamstring.  The distribution with certainly sound neuropathic however he responded dramatically to a cortisone injection in the knee making me feel some of the pain in his leg is due to arthritis in the knee  04/28/24 Patient is here today requesting a cortisone injection in his right knee.  It has been more than 3 months.  He saw substantial benefit from it previously.  However he also complains of pain in his right anterior hip.  He states the pain is constant.  It hurts worse if he tries to lift something.  He has not seen a bulge.  The pain is located in the right inguinal canal.  Today on exam there is no palpable bulge or hernia on Valsalva.  He has full flexion and extension internal and external rotation of the right hip without pain.  I am unable to appreciate any lymphadenopathy.  There is no mass on his testicle.  He has no testicular pain.  At that time, my plan was: Purpura seems exaggerated for regular senile purpura.  I will check a CBC and a CMP to evaluate for any liver dysfunction or thrombocytopenia.  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  I believe the patient has arthritis in his  right knee.  This does seem to cause pain that radiates down his right anterior shin.  Using sterile technique, I injected the right knee with 2 cc of lidocaine , 2 cc of Marcaine , and 2 cc of 40 mg/mL Kenalog .  The patient tolerated the procedure well.  History suggests a small hernia however I am unable to appreciate it today on exam.  We have decided to monitor this and avoid heavy lifting for a few weeks and see if it will get better.  06/06/24 Patient is concerned because of bruising on his arms and legs.  Patient has moderate to severe senile purpura on the anterior surfaces of both shins as well as his dorsal forearms.  In June, I checked the patient and his platelet counts were normal.  His liver function tests were normal.   His bilirubin was normal.  Therefore I do not believe that the patient has cirrhosis or an underlying bone marrow issue.  He denies any bleeding gums.  He denies any epistaxis.  He denies any hematochezia or hematuria or melena. Past Medical History:  Diagnosis Date   Alcohol abuse, daily use 08/18/2013   Alcoholic cirrhosis of liver with ascites (HCC) 08/01/2018   Arthritis    osteoarthritis   CAD (coronary artery disease)    Non obstructive   Cancer (HCC) 1994   prostate   CHF (congestive heart failure) (HCC)    Diastolic dysfunction Echo 2012   Grade 1   Essential hypertension 07/15/2013   Gout    Hx of subdural hematoma 03/2011   Hx-TIA (transient ischemic attack) 2011   Hypercholesteremia    Hypertension    Stroke [facility])    ? mini stroke    Past Surgical History:  Procedure Laterality Date   BURR HOLE FOR SUBDURAL HEMATOMA  03/2011   CARDIOVERSION N/A 02/20/2017   Procedure: CARDIOVERSION;  Surgeon: Gordy Bergamo, MD;  Location: [facility] ENDOSCOPY;  Service: Cardiovascular;  Laterality: N/A;   CHOLECYSTECTOMY N/A 08/08/2018   Procedure: LAPAROSCOPIC CHOLECYSTECTOMY;  Surgeon: Aron Shoulders, MD;  Location: [facility] ORS;  Service: General;  Laterality: N/A;   EYE SURGERY Left 03/2009   cataract   LEFT HEART CATHETERIZATION WITH CORONARY ANGIOGRAM N/A 01/02/2013   Procedure: LEFT HEART CATHETERIZATION WITH CORONARY ANGIOGRAM;  Surgeon: Erick JONELLE Bergamo, MD;  Location: [facility] CATH LAB;  Service: Cardiovascular;  Laterality: N/A;   NASAL FLAP ROTATION Right 04/18/2021   Procedure: Reconstruction of right nasal Mohs defect with flaps and grafts as necessary;  Surgeon: Elisabeth Craig RAMAN, MD;  Location: [facility] OR;  Service: Plastics;  Laterality: Right;   PROSTATE SURGERY  1994   SKIN SURGERY     SPINE SURGERY     cervical   Current Outpatient Medications on File Prior to Visit  Medication Sig Dispense Refill   allopurinol  (ZYLOPRIM ) 100 MG tablet Take 2 tablets (200 mg total) by mouth daily. 180 tablet 1    apixaban  (ELIQUIS ) 5 MG TABS tablet Take 1 tablet (5 mg total) by mouth 2 (two) times daily. 56 tablet 0   diltiazem  (CARDIZEM  CD) 180 MG 24 hr capsule TAKE 1 CAPSULE BY MOUTH EVERY DAY 90 capsule 3   folic acid  (FOLVITE ) 800 MCG tablet Take 800 mcg by mouth daily.     gabapentin  (NEURONTIN ) 300 MG capsule Take 1 capsule (300 mg total) by mouth 2 (two) times daily. Take 1 tablet (300mg ) daily and can increase to 600mg  as needed 270 capsule 3   HYDROcodone -acetaminophen  (NORCO/VICODIN) 5-325 MG tablet Take 1  tablet by mouth every 6 (six) hours as needed for moderate pain (pain score 4-6). 30 tablet 0   metoprolol  tartrate (LOPRESSOR ) 50 MG tablet TAKE 1 TABLET BY MOUTH TWICE A DAY 180 tablet 1   spironolactone  (ALDACTONE ) 50 MG tablet TAKE 1 TABLET BY MOUTH EVERY DAY 90 tablet 3   No current facility-administered medications on file prior to visit.   No Known Allergies Social History   Socioeconomic History   Marital status: Married    Spouse name: Not on file   Number of children: 2   Years of education: Not on file   Highest education level: Not on file  Occupational History   Occupation: Previous work at golf course    Employer: RETIRED  Tobacco Use   Smoking status: Former    Types: Pipe    Quit date: 04/09/2013    Years since quitting: 11.1   Smokeless tobacco: Never  Vaping Use   Vaping status: Never Used  Substance and Sexual Activity   Alcohol use: Not Currently    Comment: Pt has not had alcohol in 15 months    Drug use: No   Sexual activity: Never  Other Topics Concern   Not on file  Social History Narrative   Not on file   Social Drivers of Health   Financial Resource Strain: Low Risk  (11/21/2022)   Overall Financial Resource Strain (CARDIA)    Difficulty of Paying Living Expenses: Not hard at all  Food Insecurity: No Food Insecurity (01/10/2024)   Hunger Vital Sign    Worried About Running Out of Food in the Last Year: Never true    Ran Out of Food in the Last  Year: Never true  Transportation Needs: No Transportation Needs (11/21/2022)   PRAPARE - Administrator, Civil Service (Medical): No    Lack of Transportation (Non-Medical): No  Physical Activity: Inactive (01/10/2024)   Exercise Vital Sign    Days of Exercise per Week: 0 days    Minutes of Exercise per Session: 0 min  Stress: No Stress Concern Present (11/21/2022)   [employer] of Occupational Health - Occupational Stress Questionnaire    Feeling of Stress : Not at all  Social Connections: Socially Isolated (01/10/2024)   Social Connection and Isolation Panel    Frequency of Communication with Friends and Family: Never    Frequency of Social Gatherings with Friends and Family: Never    Attends Religious Services: Never    Database administrator or Organizations: No    Attends Banker Meetings: Never    Marital Status: Married  Catering manager Violence: Not At Risk (01/10/2024)   Humiliation, Afraid, Rape, and Kick questionnaire    Fear of Current or Ex-Partner: No    Emotionally Abused: No    Physically Abused: No    Sexually Abused: No      Review of Systems  All other systems reviewed and are negative.      Objective:   Physical Exam Vitals reviewed.  Constitutional:      General: He is not in acute distress.    Appearance: He is obese. He is not ill-appearing or toxic-appearing.  HENT:     Head: Normocephalic and atraumatic.  Cardiovascular:     Rate and Rhythm: Normal rate. Rhythm irregular.     Heart sounds: Normal heart sounds. No murmur heard.    No friction rub. No gallop.  Pulmonary:     Effort: Pulmonary effort is normal.  No respiratory distress.     Breath sounds: Normal breath sounds. No stridor. No wheezing or rhonchi.  Abdominal:     General: Bowel sounds are normal. There is no distension.     Palpations: Abdomen is soft.     Tenderness: There is no abdominal tenderness.     Hernia: There is no hernia in the left inguinal  area or right inguinal area.  Genitourinary:    Penis: Normal.      Testes: Normal.        Right: Mass or tenderness not present.        Left: Mass or tenderness not present.  Musculoskeletal:     Right lower leg: No edema.     Left lower leg: No edema.  Lymphadenopathy:     Lower Body: No right inguinal adenopathy. No left inguinal adenopathy.  Skin:    Findings: Bruising present. No erythema or rash.  Neurological:     General: No focal deficit present.     Mental Status: He is alert and oriented to person, place, and time.     Cranial Nerves: No cranial nerve deficit.     Motor: No weakness.  Psychiatric:        Mood and Affect: Mood normal.        Behavior: Behavior normal.        Thought Content: Thought content normal.    Patient has large purpura on both shins.  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  I reassured the patient that I do not feel that these are anything dangerous.  They are limited to cosmetic problems only

## 2024-06-22 NOTE — Progress Notes (Signed)
 Cardiology Office Note:  .   Date:  06/23/2024  ID:  Samuel Flores, DOB 1941-04-19, MRN [id number] PCP: Duanne Butler DASEN, MD  [facility] Providers Cardiologist:  Gordy Bergamo, MD   History of Present Illness: .   Samuel Flores is a 83 y.o. 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   He has had resolution of ascites and thrombocytopenia and now back on anticoagulation with Eliquis . Remains asymptomatic from cardiac standpoint except for mild dyspnea on exertion. This is annual visit.   Cardiac Studies relevent.    Coronary angiogram 01/10/2013: Severe diffuse coronary calcification with scattered 20-30% stenosis. Normal LVEF.   Echocardiogram 08/07/2018:  LVEF mildly reduced at 45 to 50% with global hypokinesis. Mild pulm hypertension, PASP 40 mmHg. RV moderately dilated and hypokinetic  Chest x-ray single view 08/04/2018: Cardiomegaly with vascular congestion. Improving aeration in the lung bases. Continued mild bibasilar atelectasis. No visible significant effusions.  Discussed the use of AI scribe software for clinical note transcription with the patient, who gave verbal consent to proceed.  History of Present Illness Samuel Flores is an 83 year old male with atrial fibrillation and cirrhosis who presents for cardiovascular follow-up.  He experiences occasional ankle swelling, which resolves within a day or two and is managed by cautious salt intake. He is on Eliquis  5 mg twice daily for atrial fibrillation and has a history of a minor stroke.  He bruises and bleeds easily, with recent significant bleeding after toenail trimming. He takes diltiazem  CD 180 mg daily for rate control and blood pressure management. He experiences shortness of breath, which he attributes to prior smoking history, and his activity level has remained consistent, though limited by knee pain. He can walk about an eighth of a mile uphill before experiencing leg burning. He has installed a stair lift at home to assist with mobility.   Labs   Lab Results  Component Value Date   CHOL 169 12/14/2022   HDL 35 (L) 12/14/2022   LDLCALC 113 (H) 12/14/2022   TRIG 103 12/14/2022   CHOLHDL 4.8 12/14/2022   No results found for: LIPOA  Lab Results  Component Value Date   NA 139 04/28/2024   K 4.9 04/28/2024   CO2 27 04/28/2024   GLUCOSE 109 (H) 04/28/2024   BUN 18 04/28/2024   CREATININE 0.99 04/28/2024   CALCIUM  9.1 04/28/2024   EGFR 75 04/28/2024   GFRNONAA 57 (L) 03/18/2021      Latest Ref Rng & Units 04/28/2024   12:28 PM 11/19/2023   10:35 AM 12/14/2022   11:51 AM  BMP  Glucose 65 - 99 mg/dL 890  893  97   BUN 7 - 25 mg/dL 18  20  18    Creatinine 0.70 - 1.22 mg/dL 9.00  9.04  8.99   BUN/Creat Ratio 6 - 22 (calc) SEE NOTE:  SEE NOTE:  SEE NOTE:   Sodium 135 - 146 mmol/L 139  140  138   Potassium 3.5 - 5.3 mmol/L 4.9  4.6  4.7   Chloride 98 - 110 mmol/L 101  101  100   CO2 20 -  32 mmol/L 27  26  28    Calcium  8.6 - 10.3 mg/dL 9.1  9.5  9.2       Latest Ref Rng & Units 04/28/2024   12:28 PM 11/19/2023   10:35 AM 12/14/2022   11:51 AM  CBC  WBC 3.8 - 10.8 Thousand/uL 14.0  11.3  12.6   Hemoglobin 13.2 - 17.1 g/dL 85.2  84.2  84.5   Hematocrit 38.5 - 50.0 % 44.0  46.9  44.3   Platelets 140 - 400 Thousand/uL 286  280  271    Lab Results  Component Value Date   HGBA1C 5.8 (H) 07/29/2018    Lab Results  Component Value Date   TSH 3.569 01/01/2013    ROS  Review of Systems  Cardiovascular:  Negative for chest pain, dyspnea on exertion and leg  swelling.  Gastrointestinal:  Negative for hematemesis and hematochezia.   Physical Exam:   VS:  BP 107/63 (BP Location: Left Arm, Patient Position: Sitting, Cuff Size: Normal)   Pulse 84   Resp 16   Ht 5' 10 (1.778 m)   Wt 165 lb 9.6 oz (75.1 kg)   SpO2 97%   BMI 23.76 kg/m    Wt Readings from Last 3 Encounters:  06/23/24 165 lb 9.6 oz (75.1 kg)  06/06/24 166 lb 6 oz (75.5 kg)  04/28/24 169 lb (76.7 kg)    Physical Exam Neck:     Vascular: No carotid bruit or JVD.  Cardiovascular:     Rate and Rhythm: Normal rate. Rhythm irregular.     Pulses:          Dorsalis pedis pulses are 0 on the right side and 0 on the left side.       Posterior tibial pulses are 0 on the right side and 0 on the left side.     Heart sounds: No murmur heard.    Comments: Capillary refill time < 3 sec Pulmonary:     Effort: Pulmonary effort is normal.     Breath sounds: Examination of the right-lower field reveals rhonchi. Examination of the left-lower field reveals rhonchi. Rhonchi present.  Abdominal:     General: Bowel sounds are normal.     Palpations: Abdomen is soft.  Musculoskeletal:     Right lower leg: No edema.     Left lower leg: No edema.  Skin:    Capillary Refill: Capillary refill takes less than 2 seconds.    Studies Reviewed: SABRA     EKG:    EKG Interpretation Date/Time:  Monday June 23 2024 11:32:01 EDT Ventricular Rate:  75 PR Interval:    QRS Duration:  82 QT Interval:  362 QTC Calculation: 404 R Axis:   -44  Text Interpretation: EKG 06/23/2024: Atrial fibrillation with controlled ventricular response at the rate of 75 bpm, left anterior fascicular block.  Poor R wave progression, cannot exclude anteroseptal infarct old.  Nonspecific T abnormality.  Minimal voltage criteria in aVL.  Compared to 08/21/2023, no change. Confirmed by Deavin Forst, Jagadeesh ([id number]) on 06/23/2024 11:50:50 AM    ASSESSMENT AND PLAN: .      ICD-10-CM   1. Permanent atrial fibrillation (HCC)  I48.21  EKG 12-Lead    2. Primary hypertension  I10     3. Alcoholic cirrhosis of liver without ascites (HCC)  K70.30      Click Here to Calculate/Change CHADS2VASc Score The patient's CHADS2-VASc score is  , indicating a  % annual risk of stroke.  Therefore,  anticoagulation is recommended.      Assessment & Plan Atrial fibrillation, rate controlled on diltiazem  and anticoagulated with Eliquis  Atrial fibrillation is well rate-controlled with diltiazem  180 mg once daily. Anticoagulation with Eliquis  (apixaban ) 5 mg twice daily is maintained to prevent stroke. Previous minor stroke history necessitates continued anticoagulation. Discussed the risks of bleeding and bruising due to Eliquis , but emphasized the importance of maintaining the current dose to prevent stroke. Advised caution with activities that may cause bleeding, such as nail trimming. - Continue Eliquis  5 mg twice daily. - Continue diltiazem  CD 180 mg once daily. - Advise caution with activities that may cause bleeding.  Cirrhosis of liver, stable on spironolactone , with history of alcohol dependence in remission Cirrhosis is well-managed with no ascites present. Spironolactone  50 mg once daily is effective. Alcohol dependence is in remission for the past seven to eight years, contributing to the stability of the liver condition. - Continue spironolactone  50 mg once daily.  History of stroke, on secondary prevention with anticoagulation Minor stroke with secondary prevention through anticoagulation with Eliquis . Continued anticoagulation is crucial to prevent recurrent stroke, especially in the context of atrial fibrillation. - Continue anticoagulation with Eliquis  5 mg twice daily.  Pulmonary scarring and atelectasis by CXR Dyspnea is likely related to prior smoking history and lung scarring. No significant change in activity level or symptoms over the past six months.  No clinical evidence of heart failure. - Encourage continued  physical activity as tolerated.  Edema of lower extremities, intermittent and diet-related Intermittent lower extremity edema, likely related to dietary salt intake. Edema resolves within a day or two as the body adjusts. - Advise monitoring and reducing dietary salt intake.  As he has remained stable from cardiac standpoint and on appropriate medical therapy, I will see him back on a as needed basis.   Follow up: PRN  Signed,  Gordy Bergamo, MD, [facility] 06/23/2024, 11:51 AM [facility] [address] [place], [place] [address] Phone: [phone]. Fax:  [phone]

## 2024-06-23 ENCOUNTER — Ambulatory Visit: Attending: Cardiology | Admitting: Cardiology

## 2024-06-23 ENCOUNTER — Encounter: Payer: Self-pay | Admitting: Cardiology

## 2024-06-23 VITALS — BP 107/63 | HR 84 | Resp 16 | Ht 70.0 in | Wt 165.6 lb

## 2024-06-23 DIAGNOSIS — I10 Essential (primary) hypertension: Secondary | ICD-10-CM | POA: Insufficient documentation

## 2024-06-23 DIAGNOSIS — K7030 Alcoholic cirrhosis of liver without ascites: Secondary | ICD-10-CM | POA: Diagnosis not present

## 2024-06-23 DIAGNOSIS — I4821 Permanent atrial fibrillation: Secondary | ICD-10-CM | POA: Diagnosis not present

## 2024-06-23 NOTE — Patient Instructions (Signed)

## 2024-06-25 ENCOUNTER — Telehealth: Payer: Self-pay

## 2024-06-25 NOTE — Telephone Encounter (Signed)
 Pt came into office to ask if pcp would send in a prescription for prednisone . Pt stated that he has discussed getting this med sent in. Pt states that he is in a lot of pain and would now like to try to see if this med will help. Please contact pt if this med will be sent in. Please advise.  Cb#: [id number]

## 2024-06-26 ENCOUNTER — Other Ambulatory Visit: Payer: Self-pay | Admitting: Family Medicine

## 2024-06-26 MED ORDER — PREDNISONE 20 MG PO TABS
ORAL_TABLET | ORAL | 0 refills | Status: DC
Start: 2024-06-26 — End: 2024-07-04

## 2024-07-03 ENCOUNTER — Telehealth: Payer: Self-pay

## 2024-07-03 NOTE — Telephone Encounter (Signed)
 Pt came into office to inform pcp that this med predniSONE  (DELTASONE ) 20 MG tablet [[id number]] has worked very well for him and would like to know if he could get a 30 or 90 day prescription of this sent to his pharmacy. Pt would like a cb from nurse please if this is possible. Please advise.  Cb#: [id number]  LOV: 06/06/24  PHARMACY: [id number] [place], [place] - [address] [place] [address] Phone: [phone]  Fax: [phone]

## 2024-07-04 ENCOUNTER — Other Ambulatory Visit: Payer: Self-pay | Admitting: Family Medicine

## 2024-07-04 DIAGNOSIS — M79604 Pain in right leg: Secondary | ICD-10-CM

## 2024-07-04 DIAGNOSIS — M1A09X0 Idiopathic chronic gout, multiple sites, without tophus (tophi): Secondary | ICD-10-CM

## 2024-07-04 MED ORDER — PREDNISONE 5 MG PO TABS: 5.0000 mg | ORAL_TABLET | Freq: Every day | ORAL | 0 refills | Status: DC

## 2024-07-07 NOTE — Telephone Encounter (Signed)
 Requested Prescriptions  Pending Prescriptions Disp Refills   allopurinol  (ZYLOPRIM ) 100 MG tablet [Pharmacy Med Name: ALLOPURINOL  100 MG TABLET] 180 tablet 1    Sig: TAKE 2 TABLETS BY MOUTH EVERY DAY     Endocrinology:  Gout Agents - allopurinol  Failed - 07/07/2024 10:46 AM      Failed - Uric Acid in normal range and within 360 days    Uric Acid, Serum  Date Value Ref Range Status  02/27/2017 4.4 4.0 - 8.0 mg/dL Final         Passed - Cr in normal range and within 360 days    Creat  Date Value Ref Range Status  04/28/2024 0.99 0.70 - 1.22 mg/dL Final         Passed - Valid encounter within last 12 months    Recent Outpatient Visits           1 month ago Purpura senilis (HCC)   [facility] [facility] Duanne Butler DASEN, MD   2 months ago Purpura [facility])   [facility] Duanne Butler DASEN, MD   5 months ago Right leg pain   [place] [facility] Duanne Butler DASEN, MD   7 months ago Right leg pain   [place] [facility] Duanne, Butler DASEN, MD   7 months ago CHF with left ventricular diastolic dysfunction, NYHA class 2 (HCC)    [facility] Pickard, Butler DASEN, MD              Passed - CBC within normal limits and completed in the last 12 months    WBC  Date Value Ref Range Status  04/28/2024 14.0 (H) 3.8 - 10.8 Thousand/uL Final   RBC  Date Value Ref Range Status  04/28/2024 4.41 4.20 - 5.80 Million/uL Final   Hemoglobin  Date Value Ref Range Status  04/28/2024 14.7 13.2 - 17.1 g/dL Final   HCT  Date Value Ref Range Status  04/28/2024 44.0 38.5 - 50.0 % Final   MCHC  Date Value Ref Range Status  04/28/2024 33.4 32.0 - 36.0 g/dL Final    Comment:    For adults, a slight decrease in the calculated MCHC value (in the range of 30 to 32 g/dL) is most likely not clinically significant; however, it should be interpreted with caution in correlation with  other red cell parameters and the patient's clinical condition.    [facility]  Date Value Ref Range Status  04/28/2024 33.3 (H) 27.0 - 33.0 pg Final   MCV  Date Value Ref Range Status  04/28/2024 99.8 80.0 - 100.0 fL Final   No results found for: PLTCOUNTKUC, LABPLAT, POCPLA RDW  Date Value Ref Range Status  04/28/2024 12.2 11.0 - 15.0 % Final

## 2024-07-29 DIAGNOSIS — H53453 Other localized visual field defect, bilateral: Secondary | ICD-10-CM | POA: Diagnosis not present

## 2024-07-29 DIAGNOSIS — D23111 Other benign neoplasm of skin of right upper eyelid, including canthus: Secondary | ICD-10-CM | POA: Diagnosis not present

## 2024-07-29 DIAGNOSIS — H35363 Drusen (degenerative) of macula, bilateral: Secondary | ICD-10-CM | POA: Diagnosis not present

## 2024-07-29 DIAGNOSIS — Z961 Presence of intraocular lens: Secondary | ICD-10-CM | POA: Diagnosis not present

## 2024-08-12 ENCOUNTER — Other Ambulatory Visit: Payer: Self-pay | Admitting: Cardiology

## 2024-08-12 DIAGNOSIS — I4821 Permanent atrial fibrillation: Secondary | ICD-10-CM

## 2024-08-12 DIAGNOSIS — G4762 Sleep related leg cramps: Secondary | ICD-10-CM

## 2024-08-22 ENCOUNTER — Other Ambulatory Visit: Payer: Self-pay | Admitting: Cardiology

## 2024-08-22 DIAGNOSIS — K7030 Alcoholic cirrhosis of liver without ascites: Secondary | ICD-10-CM

## 2024-08-25 DIAGNOSIS — Z23 Encounter for immunization: Secondary | ICD-10-CM | POA: Diagnosis not present

## 2024-09-22 ENCOUNTER — Encounter: Payer: Self-pay | Admitting: Family Medicine

## 2024-09-22 ENCOUNTER — Ambulatory Visit: Admitting: Family Medicine

## 2024-09-22 VITALS — BP 116/62 | HR 53 | Temp 97.6°F | Ht 70.0 in | Wt 168.2 lb

## 2024-09-22 DIAGNOSIS — R0609 Other forms of dyspnea: Secondary | ICD-10-CM | POA: Diagnosis not present

## 2024-09-22 MED ORDER — HYDROCODONE-ACETAMINOPHEN 5-325 MG PO TABS: 1.0000 | ORAL_TABLET | Freq: Four times a day (QID) | ORAL | 0 refills | Status: DC | PRN

## 2024-09-22 MED ORDER — GABAPENTIN 300 MG PO CAPS: 300.0000 mg | ORAL_CAPSULE | Freq: Three times a day (TID) | ORAL | 3 refills | Status: AC | PRN

## 2024-09-22 NOTE — Progress Notes (Signed)
 Subjective:    Patient ID: Samuel Flores, male    DOB: 1941-05-12, 83 y.o.   MRN: [id number]  Patient is here today requesting [employer] portable oxygen  concentrator.  He has shortness of breath with activity and he thinks that this may help improve his activity tolerance.  He denies any chest pain.  He denies any cough or hemoptysis.  He denies any purulent sputum or fever.  He denies any orthopnea or paroxysmal nocturnal dyspnea.  He denies any history of COPD.  He denies any history of heart failure.  He does have coronary artery disease but he denies any leg swelling and there is no evidence of fluid overload.  Pulse oximetry is 93 percent at rest on room air.  Patient ambulated over 200 feet and pulse oximetry remained 93% on room air. Past Medical History:  Diagnosis Date   Alcohol abuse, daily use 08/18/2013   Alcoholic cirrhosis of liver with ascites (HCC) 08/01/2018   Arthritis    osteoarthritis   CAD (coronary artery disease)    Non obstructive   Cancer (HCC) 1994   prostate   CHF (congestive heart failure) (HCC)    Diastolic dysfunction Echo 2012   Grade 1   Essential hypertension 07/15/2013   Gout    Hx of subdural hematoma 03/2011   Hx-TIA (transient ischemic attack) 2011   Hypercholesteremia    Hypertension    Stroke [facility])    ? mini stroke    Past Surgical History:  Procedure Laterality Date   BURR HOLE FOR SUBDURAL HEMATOMA  03/2011   CARDIOVERSION N/A 02/20/2017   Procedure: CARDIOVERSION;  Surgeon: Gordy Bergamo, MD;  Location: [facility] ENDOSCOPY;  Service: Cardiovascular;  Laterality: N/A;   CHOLECYSTECTOMY N/A 08/08/2018   Procedure: LAPAROSCOPIC CHOLECYSTECTOMY;  Surgeon: Aron Shoulders, MD;  Location: [facility] ORS;  Service: General;  Laterality: N/A;   EYE SURGERY Left 03/2009   cataract   LEFT HEART CATHETERIZATION WITH CORONARY ANGIOGRAM N/A 01/02/2013   Procedure: LEFT HEART CATHETERIZATION WITH CORONARY ANGIOGRAM;  Surgeon: Erick JONELLE Bergamo, MD;  Location[facility] CATH LAB;   Service: Cardiovascular;  Laterality: N/A;   NASAL FLAP ROTATION Right 04/18/2021   Procedure: Reconstruction of right nasal Mohs defect with flaps and grafts as necessary;  Surgeon: Elisabeth Craig RAMAN, MD;  Location: [facility] OR;  Service: Plastics;  Laterality: Right;   PROSTATE SURGERY  1994   SKIN SURGERY     SPINE SURGERY     cervical   Current Outpatient Medications on File Prior to Visit  Medication Sig Dispense Refill   allopurinol  (ZYLOPRIM ) 100 MG tablet TAKE 2 TABLETS BY MOUTH EVERY DAY 180 tablet 1   apixaban  (ELIQUIS ) 5 MG TABS tablet Take 1 tablet (5 mg total) by mouth 2 (two) times daily. 56 tablet 0   diltiazem  (CARDIZEM  CD) 180 MG 24 hr capsule TAKE 1 CAPSULE BY MOUTH EVERY DAY 90 capsule 3   folic acid  (FOLVITE ) 800 MCG tablet Take 800 mcg by mouth daily.     metoprolol  tartrate (LOPRESSOR ) 50 MG tablet TAKE 1 TABLET BY MOUTH TWICE A DAY 180 tablet 2   predniSONE  (DELTASONE ) 5 MG tablet Take 1 tablet (5 mg total) by mouth daily with breakfast. 90 tablet 0   spironolactone  (ALDACTONE ) 50 MG tablet TAKE 1 TABLET BY MOUTH EVERY DAY 90 tablet 3   No current facility-administered medications on file prior to visit.   No Known Allergies Social History   Socioeconomic History   Marital status: Married  Spouse name: Not on file   Number of children: 2   Years of education: Not on file   Highest education level: Not on file  Occupational History   Occupation: Previous work at golf course    Employer: RETIRED  Tobacco Use   Smoking status: Former    Types: Pipe    Quit date: 04/09/2013    Years since quitting: 11.4   Smokeless tobacco: Never  Vaping Use   Vaping status: Never Used  Substance and Sexual Activity   Alcohol use: Not Currently    Comment: Pt has not had alcohol in 15 months    Drug use: No   Sexual activity: Never  Other Topics Concern   Not on file  Social History Narrative   Not on file   Social Drivers of Health   Financial Resource Strain: Low Risk   (11/21/2022)   Overall Financial Resource Strain (CARDIA)    Difficulty of Paying Living Expenses: Not hard at all  Food Insecurity: No Food Insecurity (01/10/2024)   Hunger Vital Sign    Worried About Running Out of Food in the Last Year: Never true    Ran Out of Food in the Last Year: Never true  Transportation Needs: No Transportation Needs (11/21/2022)   PRAPARE - Administrator, Civil Service (Medical): No    Lack of Transportation (Non-Medical): No  Physical Activity: Inactive (01/10/2024)   Exercise Vital Sign    Days of Exercise per Week: 0 days    Minutes of Exercise per Session: 0 min  Stress: No Stress Concern Present (11/21/2022)   [employer] of Occupational Health - Occupational Stress Questionnaire    Feeling of Stress : Not at all  Social Connections: Socially Isolated (01/10/2024)   Social Connection and Isolation Panel    Frequency of Communication with Friends and Family: Never    Frequency of Social Gatherings with Friends and Family: Never    Attends Religious Services: Never    Database Administrator or Organizations: No    Attends Banker Meetings: Never    Marital Status: Married  Catering Manager Violence: Not At Risk (01/10/2024)   Humiliation, Afraid, Rape, and Kick questionnaire    Fear of Current or Ex-Partner: No    Emotionally Abused: No    Physically Abused: No    Sexually Abused: No      Review of Systems  All other systems reviewed and are negative.      Objective:   Physical Exam Vitals reviewed.  Constitutional:      General: He is not in acute distress.    Appearance: He is obese. He is not ill-appearing or toxic-appearing.  HENT:     Head: Normocephalic and atraumatic.  Cardiovascular:     Rate and Rhythm: Normal rate. Rhythm irregular.     Heart sounds: Normal heart sounds. No murmur heard.    No friction rub. No gallop.  Pulmonary:     Effort: Pulmonary effort is normal. No respiratory distress.      Breath sounds: Normal breath sounds. No stridor. No wheezing or rhonchi.  Abdominal:     General: Bowel sounds are normal. There is no distension.     Palpations: Abdomen is soft.     Tenderness: There is no abdominal tenderness.     Hernia: There is no hernia in the left inguinal area or right inguinal area.  Genitourinary:    Penis: Normal.      Testes: Normal.  Right: Mass or tenderness not present.        Left: Mass or tenderness not present.  Musculoskeletal:     Right lower leg: No edema.     Left lower leg: No edema.  Lymphadenopathy:     Lower Body: No right inguinal adenopathy. No left inguinal adenopathy.  Skin:    Findings: Bruising present. No erythema or rash.  Neurological:     General: No focal deficit present.     Mental Status: He is alert and oriented to person, place, and time.     Cranial Nerves: No cranial nerve deficit.     Motor: No weakness.  Psychiatric:        Mood and Affect: Mood normal.        Behavior: Behavior normal.        Thought Content: Thought content normal.       Assessment & Plan:  DOE (dyspnea on exertion) Exam today is normal.  Patient does not qualify for portable oxygen .  I believe that his dyspnea is more deconditioning and age-related than true shortness of breath.  There is also likely an element of atrial fibrillation at play.  Patient request a refill on his gabapentin  and on his Vicodin that he uses sparingly for leg pain in his right leg

## 2024-09-30 ENCOUNTER — Other Ambulatory Visit: Payer: Self-pay | Admitting: Family Medicine

## 2024-10-02 NOTE — Telephone Encounter (Signed)
 Requested medication (s) are due for refill today: yes  Requested medication (s) are on the active medication list: yes  Last refill:  07/04/24  Future visit scheduled: no  Notes to clinic:  Unable to refill per protocol, cannot delegate.      Requested Prescriptions  Pending Prescriptions Disp Refills   predniSONE  (DELTASONE ) 5 MG tablet [Pharmacy Med Name: PREDNISONE  5 MG TABLET] 90 tablet 0    Sig: TAKE 1 TABLET BY MOUTH EVERY DAY WITH BREAKFAST     Not Delegated - Endocrinology:  Oral Corticosteroids Failed - 10/02/2024  1:42 PM      Failed - This refill cannot be delegated      Failed - Manual Review: Eye exam for IOP if prolonged treatment      Failed - Glucose (serum) in normal range and within 180 days    Glucose, Bld  Date Value Ref Range Status  04/28/2024 109 (H) 65 - 99 mg/dL Final    Comment:    .            Fasting reference interval . For someone without known diabetes, a glucose value between 100 and 125 mg/dL is consistent with prediabetes and should be confirmed with a follow-up test. .    Glucose-Capillary  Date Value Ref Range Status  08/02/2018 111 (H) 70 - 99 mg/dL Final         Failed - Bone Mineral Density or Dexa Scan completed in the last 2 years      Passed - K in normal range and within 180 days    Potassium  Date Value Ref Range Status  04/28/2024 4.9 3.5 - 5.3 mmol/L Final         Passed - Na in normal range and within 180 days    Sodium  Date Value Ref Range Status  04/28/2024 139 135 - 146 mmol/L Final         Passed - Last BP in normal range    BP Readings from Last 1 Encounters:  09/22/24 116/62         Passed - Valid encounter within last 6 months    Recent Outpatient Visits           1 week ago DOE (dyspnea on exertion)   [facility] Duanne Butler DASEN, MD   3 months ago Purpura senilis   [place] [facility] Duanne, Butler DASEN, MD   5 months ago Purpura   [facility] Duanne Butler DASEN, MD   8 months ago Right leg pain   [place] [facility] Duanne Butler DASEN, MD   10 months ago Right leg pain   [place] [facility] Pickard, Butler DASEN, MD

## 2024-10-21 ENCOUNTER — Other Ambulatory Visit: Payer: Self-pay | Admitting: *Deleted

## 2024-10-21 DIAGNOSIS — I4821 Permanent atrial fibrillation: Secondary | ICD-10-CM

## 2024-10-21 MED ORDER — APIXABAN 5 MG PO TABS: 5.0000 mg | ORAL_TABLET | Freq: Two times a day (BID) | ORAL | 1 refills | Status: AC

## 2024-10-21 NOTE — Telephone Encounter (Signed)
 Eliquis  5mg  refill request received. Patient is 83 years old, weight-76.3kg, Crea-0.99 on 04/28/24, Diagnosis-Afib, and last seen by on Dr. Ladona on 06/23/24. Dose is appropriate based on dosing criteria. Will send in refill to requested pharmacy.

## 2024-10-27 ENCOUNTER — Other Ambulatory Visit: Payer: Self-pay | Admitting: Family Medicine

## 2024-10-27 NOTE — Telephone Encounter (Unsigned)
 Copied from CRM #[id number]. Topic: Clinical - Medication Refill >> Oct 27, 2024  4:14 PM Kevelyn M wrote: Medication: HYDROcodone -acetaminophen  (NORCO/VICODIN) 5-325 MG tablet   Has the patient contacted their pharmacy? No (Agent: If no, request that the patient contact the pharmacy for the refill. If patient does not wish to contact the pharmacy document the reason why and proceed with request.) (Agent: If yes, when and what did the pharmacy advise?)  This is the patient's preferred pharmacy:  [id number] [place], [place] - [address] [place] [address] Phone: [phone] Fax: [phone]  Is this the correct pharmacy for this prescription? Yes If no, delete pharmacy and type the correct one.   Has the prescription been filled recently? Yes  Is the patient out of the medication? No  Has the patient been seen for an appointment in the last year OR does the patient have an upcoming appointment? Yes  Can we respond through MyChart? No  Agent: Please be advised that Rx refills may take up to 3 business days. We ask that you follow-up with your pharmacy.

## 2024-10-30 ENCOUNTER — Telehealth: Payer: Self-pay

## 2024-10-30 MED ORDER — HYDROCODONE-ACETAMINOPHEN 5-325 MG PO TABS: 1.0000 | ORAL_TABLET | Freq: Four times a day (QID) | ORAL | 0 refills | Status: DC | PRN

## 2024-10-30 NOTE — Telephone Encounter (Signed)
 Requested medication (s) are due for refill today: na   Requested medication (s) are on the active medication list: yes   Last refill:  09/22/24 #30 0 refills  Future visit scheduled: yes 01/12/25  Notes to clinic:  not delegated per protocol. Do you want to refill Rx?     Requested Prescriptions  Pending Prescriptions Disp Refills   HYDROcodone -acetaminophen  (NORCO/VICODIN) 5-325 MG tablet 30 tablet 0    Sig: Take 1 tablet by mouth every 6 (six) hours as needed for moderate pain (pain score 4-6).     Not Delegated - Analgesics:  Opioid Agonist Combinations Failed - 10/30/2024 12:39 PM      Failed - This refill cannot be delegated      Failed - Urine Drug Screen completed in last 360 days      Passed - Valid encounter within last 3 months    Recent Outpatient Visits           1 month ago DOE (dyspnea on exertion)   [place] [facility] Duanne Butler DASEN, MD   4 months ago Purpura senilis   [place] [facility] Duanne Butler DASEN, MD   6 months ago Purpura   [place] [facility] Duanne Butler DASEN, MD   9 months ago Right leg pain   [place] [facility] Duanne Butler DASEN, MD   11 months ago Right leg pain   [place] [facility] [place], Butler DASEN, MD

## 2024-10-30 NOTE — Telephone Encounter (Signed)
 Copied from CRM #[id number]. Topic: Clinical - Prescription Issue >> Oct 30, 2024  9:53 AM Wyona SQUIBB wrote: Reason for CRM: PT sent refill on 12/15 for medication HYDROcodone -acetaminophen  (NORCO/VICODIN) 5-325 MG tablet  Pt only has 3 pills left please follow up with pt and advise

## 2024-10-30 NOTE — Telephone Encounter (Signed)
 Copied from CRM #[id number]. Topic: Clinical - Prescription Issue >> Oct 30, 2024  4:27 PM Jasmin G wrote: Reason for CRM: Pt requested a call back ASAP, today if possible to discuss the status of med refill for HYDROcodone -acetaminophen  (NORCO/VICODIN) 5-325 MG tablet, pt states that he doesn't want to run out.

## 2024-10-31 ENCOUNTER — Other Ambulatory Visit: Payer: Self-pay | Admitting: Family Medicine

## 2024-10-31 MED ORDER — HYDROCODONE-ACETAMINOPHEN 5-325 MG PO TABS: 1.0000 | ORAL_TABLET | Freq: Four times a day (QID) | ORAL | 0 refills | Status: AC | PRN

## 2024-11-19 ENCOUNTER — Other Ambulatory Visit: Payer: Self-pay | Admitting: Cardiology

## 2024-12-05 ENCOUNTER — Ambulatory Visit: Admitting: Family Medicine

## 2024-12-08 ENCOUNTER — Ambulatory Visit: Admitting: Family Medicine

## 2025-01-12 ENCOUNTER — Ambulatory Visit: Admitting: Family Medicine
